# Patient Record
Sex: Male | Born: 1935 | Race: White | Hispanic: No | Marital: Married | Smoking: Never smoker
Health system: Southern US, Community
[De-identification: ages and names within clinical notes are randomized; demographics above are authoritative.]

## PROBLEM LIST (undated history)

## (undated) DIAGNOSIS — I447 Left bundle-branch block, unspecified: Secondary | ICD-10-CM

## (undated) DIAGNOSIS — I10 Essential (primary) hypertension: Secondary | ICD-10-CM

## (undated) DIAGNOSIS — Z950 Presence of cardiac pacemaker: Secondary | ICD-10-CM

## (undated) DIAGNOSIS — Z952 Presence of prosthetic heart valve: Secondary | ICD-10-CM

## (undated) DIAGNOSIS — Z9989 Dependence on other enabling machines and devices: Secondary | ICD-10-CM

## (undated) DIAGNOSIS — H9190 Unspecified hearing loss, unspecified ear: Secondary | ICD-10-CM

## (undated) DIAGNOSIS — E785 Hyperlipidemia, unspecified: Secondary | ICD-10-CM

## (undated) DIAGNOSIS — M479 Spondylosis, unspecified: Secondary | ICD-10-CM

## (undated) DIAGNOSIS — I440 Atrioventricular block, first degree: Secondary | ICD-10-CM

## (undated) DIAGNOSIS — I4891 Unspecified atrial fibrillation: Secondary | ICD-10-CM

## (undated) DIAGNOSIS — I639 Cerebral infarction, unspecified: Secondary | ICD-10-CM

## (undated) DIAGNOSIS — I350 Nonrheumatic aortic (valve) stenosis: Secondary | ICD-10-CM

## (undated) DIAGNOSIS — C4492 Squamous cell carcinoma of skin, unspecified: Secondary | ICD-10-CM

## (undated) DIAGNOSIS — I459 Conduction disorder, unspecified: Secondary | ICD-10-CM

## (undated) DIAGNOSIS — F528 Other sexual dysfunction not due to a substance or known physiological condition: Secondary | ICD-10-CM

## (undated) DIAGNOSIS — H3530 Unspecified macular degeneration: Secondary | ICD-10-CM

## (undated) DIAGNOSIS — I421 Obstructive hypertrophic cardiomyopathy: Secondary | ICD-10-CM

## (undated) DIAGNOSIS — I313 Pericardial effusion (noninflammatory): Secondary | ICD-10-CM

## (undated) DIAGNOSIS — C61 Malignant neoplasm of prostate: Secondary | ICD-10-CM

## (undated) DIAGNOSIS — R001 Bradycardia, unspecified: Secondary | ICD-10-CM

## (undated) DIAGNOSIS — M1990 Unspecified osteoarthritis, unspecified site: Secondary | ICD-10-CM

## (undated) DIAGNOSIS — Z973 Presence of spectacles and contact lenses: Secondary | ICD-10-CM

## (undated) DIAGNOSIS — G459 Transient cerebral ischemic attack, unspecified: Secondary | ICD-10-CM

## (undated) DIAGNOSIS — I3139 Other pericardial effusion (noninflammatory): Secondary | ICD-10-CM

## (undated) DIAGNOSIS — C679 Malignant neoplasm of bladder, unspecified: Secondary | ICD-10-CM

## (undated) HISTORY — DX: Left bundle-branch block, unspecified: I44.7

## (undated) HISTORY — DX: Obstructive hypertrophic cardiomyopathy: I42.1

## (undated) HISTORY — DX: Other sexual dysfunction not due to a substance or known physiological condition: F52.8

## (undated) HISTORY — PX: CATARACT EXTRACTION W/ INTRAOCULAR LENS  IMPLANT, BILATERAL: SHX1307

## (undated) HISTORY — DX: Transient cerebral ischemic attack, unspecified: G45.9

## (undated) HISTORY — DX: Bradycardia, unspecified: R00.1

## (undated) HISTORY — PX: SQUAMOUS CELL CARCINOMA EXCISION: SHX2433

## (undated) HISTORY — PX: CARDIAC VALVE REPLACEMENT: SHX585

## (undated) HISTORY — DX: Squamous cell carcinoma of skin, unspecified: C44.92

## (undated) HISTORY — DX: Conduction disorder, unspecified: I45.9

## (undated) HISTORY — PX: ANTERIOR CERVICAL DECOMP/DISCECTOMY FUSION: SHX1161

## (undated) HISTORY — DX: Essential (primary) hypertension: I10

## (undated) HISTORY — DX: Nonrheumatic aortic (valve) stenosis: I35.0

## (undated) HISTORY — PX: JOINT REPLACEMENT: SHX530

## (undated) HISTORY — DX: Atrioventricular block, first degree: I44.0

## (undated) HISTORY — DX: Unspecified hearing loss, unspecified ear: H91.90

## (undated) HISTORY — DX: Spondylosis, unspecified: M47.9

## (undated) HISTORY — PX: BACK SURGERY: SHX140

## (undated) HISTORY — DX: Unspecified osteoarthritis, unspecified site: M19.90

## (undated) HISTORY — PX: TONSILLECTOMY: SUR1361

---

## 1998-01-09 ENCOUNTER — Ambulatory Visit (HOSPITAL_COMMUNITY): Admission: RE | Admit: 1998-01-09 | Discharge: 1998-01-09 | Payer: Self-pay | Admitting: Cardiology

## 2002-08-20 ENCOUNTER — Encounter: Payer: Self-pay | Admitting: Family Medicine

## 2002-08-20 ENCOUNTER — Encounter: Admission: RE | Admit: 2002-08-20 | Discharge: 2002-08-20 | Payer: Self-pay | Admitting: Family Medicine

## 2004-09-11 ENCOUNTER — Ambulatory Visit: Payer: Self-pay | Admitting: Family Medicine

## 2005-02-25 ENCOUNTER — Emergency Department (HOSPITAL_COMMUNITY): Admission: EM | Admit: 2005-02-25 | Discharge: 2005-02-25 | Payer: Self-pay | Admitting: Emergency Medicine

## 2005-06-19 ENCOUNTER — Ambulatory Visit: Payer: Self-pay | Admitting: Family Medicine

## 2005-08-16 HISTORY — PX: TOTAL SHOULDER ARTHROPLASTY: SHX126

## 2005-09-20 ENCOUNTER — Ambulatory Visit: Payer: Self-pay | Admitting: Family Medicine

## 2006-06-07 ENCOUNTER — Inpatient Hospital Stay (HOSPITAL_COMMUNITY): Admission: RE | Admit: 2006-06-07 | Discharge: 2006-06-10 | Payer: Self-pay | Admitting: Orthopedic Surgery

## 2006-09-27 ENCOUNTER — Ambulatory Visit: Payer: Self-pay | Admitting: Family Medicine

## 2006-09-27 LAB — CONVERTED CEMR LAB
ALT: 18 units/L (ref 0–40)
AST: 19 units/L (ref 0–37)
Basophils Absolute: 0.1 10*3/uL (ref 0.0–0.1)
Cholesterol: 179 mg/dL (ref 0–200)
Creatinine, Ser: 1 mg/dL (ref 0.4–1.5)
Eosinophils Absolute: 0.1 10*3/uL (ref 0.0–0.6)
Eosinophils Relative: 1.7 % (ref 0.0–5.0)
Glucose, Bld: 88 mg/dL (ref 70–99)
HDL: 37.3 mg/dL — ABNORMAL LOW (ref >39.0)
Lymphocytes Relative: 23.6 % (ref 12.0–46.0)
MCHC: 33.9 g/dL (ref 30.0–36.0)
MCV: 86.2 fL (ref 78.0–100.0)
Monocytes Relative: 9.6 % (ref 3.0–11.0)
Neutro Abs: 4.2 10*3/uL (ref 1.4–7.7)
PSA: 5.07 ng/mL — ABNORMAL HIGH (ref 0.10–4.00)
Platelets: 224 10*3/uL (ref 150–400)
Potassium: 3.8 meq/L (ref 3.5–5.1)
RBC: 5.48 M/uL (ref 4.22–5.81)
Total Bilirubin: 1 mg/dL (ref 0.3–1.2)
Total CHOL/HDL Ratio: 4.8
Triglycerides: 140 mg/dL (ref 0–149)
WBC: 6.6 10*3/uL (ref 4.5–10.5)

## 2007-02-21 ENCOUNTER — Ambulatory Visit: Payer: Self-pay | Admitting: Family Medicine

## 2007-02-21 LAB — CONVERTED CEMR LAB: PSA: 4.69 ng/mL — ABNORMAL HIGH (ref 0.10–4.00)

## 2007-02-28 ENCOUNTER — Ambulatory Visit: Payer: Self-pay | Admitting: Family Medicine

## 2007-05-24 DIAGNOSIS — N401 Enlarged prostate with lower urinary tract symptoms: Secondary | ICD-10-CM | POA: Insufficient documentation

## 2007-05-24 DIAGNOSIS — R351 Nocturia: Secondary | ICD-10-CM | POA: Insufficient documentation

## 2007-05-24 DIAGNOSIS — M479 Spondylosis, unspecified: Secondary | ICD-10-CM | POA: Insufficient documentation

## 2007-05-24 DIAGNOSIS — H9190 Unspecified hearing loss, unspecified ear: Secondary | ICD-10-CM

## 2007-05-24 DIAGNOSIS — I10 Essential (primary) hypertension: Secondary | ICD-10-CM

## 2007-05-24 DIAGNOSIS — H269 Unspecified cataract: Secondary | ICD-10-CM | POA: Insufficient documentation

## 2007-05-24 DIAGNOSIS — F528 Other sexual dysfunction not due to a substance or known physiological condition: Secondary | ICD-10-CM

## 2007-05-26 ENCOUNTER — Ambulatory Visit: Payer: Self-pay | Admitting: Family Medicine

## 2007-10-03 ENCOUNTER — Ambulatory Visit: Payer: Self-pay | Admitting: Family Medicine

## 2007-10-03 LAB — CONVERTED CEMR LAB
Blood in Urine, dipstick: NEGATIVE
Nitrite: NEGATIVE
WBC Urine, dipstick: NEGATIVE

## 2007-10-12 ENCOUNTER — Telehealth: Payer: Self-pay | Admitting: Family Medicine

## 2008-03-11 ENCOUNTER — Telehealth: Payer: Self-pay | Admitting: Family Medicine

## 2008-03-18 ENCOUNTER — Telehealth: Payer: Self-pay | Admitting: Family Medicine

## 2008-03-19 ENCOUNTER — Ambulatory Visit: Payer: Self-pay | Admitting: Family Medicine

## 2008-03-20 LAB — CONVERTED CEMR LAB
ALT: 18 units/L (ref 0–53)
AST: 21 units/L (ref 0–37)
Basophils Relative: 0.2 % (ref 0.0–1.0)
Bilirubin, Direct: 0.2 mg/dL (ref 0.0–0.3)
CO2: 31 meq/L (ref 19–32)
Calcium: 9.5 mg/dL (ref 8.4–10.5)
Chloride: 102 meq/L (ref 96–112)
Creatinine, Ser: 1 mg/dL (ref 0.4–1.5)
Eosinophils Relative: 2 % (ref 0.0–5.0)
GFR calc non Af Amer: 76 mL/min
Glucose, Bld: 97 mg/dL (ref 70–99)
HCT: 46.6 % (ref 39.0–52.0)
Platelets: 215 10*3/uL (ref 150–400)
RBC: 5.36 M/uL (ref 4.22–5.81)
RDW: 12.4 % (ref 11.5–14.6)
Total Bilirubin: 1.1 mg/dL (ref 0.3–1.2)
Total CHOL/HDL Ratio: 5.4
Total Protein: 6.3 g/dL (ref 6.0–8.3)
Triglycerides: 121 mg/dL (ref 0–149)
VLDL: 24 mg/dL (ref 0–40)
WBC: 6.9 10*3/uL (ref 4.5–10.5)

## 2008-03-25 ENCOUNTER — Telehealth: Payer: Self-pay | Admitting: Family Medicine

## 2008-05-17 ENCOUNTER — Ambulatory Visit: Payer: Self-pay | Admitting: Family Medicine

## 2008-08-16 DIAGNOSIS — C61 Malignant neoplasm of prostate: Secondary | ICD-10-CM

## 2008-08-16 HISTORY — DX: Malignant neoplasm of prostate: C61

## 2008-09-10 ENCOUNTER — Ambulatory Visit: Payer: Self-pay | Admitting: Family Medicine

## 2008-10-03 ENCOUNTER — Ambulatory Visit: Payer: Self-pay | Admitting: Family Medicine

## 2008-10-03 LAB — CONVERTED CEMR LAB
Blood in Urine, dipstick: NEGATIVE
Glucose, Urine, Semiquant: NEGATIVE
WBC Urine, dipstick: NEGATIVE
pH: 6.5

## 2008-10-16 ENCOUNTER — Telehealth: Payer: Self-pay | Admitting: Family Medicine

## 2008-10-16 LAB — CONVERTED CEMR LAB
AST: 19 units/L (ref 0–37)
Alkaline Phosphatase: 52 units/L (ref 39–117)
Basophils Absolute: 0 10*3/uL (ref 0.0–0.1)
Chloride: 103 meq/L (ref 96–112)
Cholesterol: 196 mg/dL (ref 0–200)
Eosinophils Absolute: 0.2 10*3/uL (ref 0.0–0.7)
GFR calc non Af Amer: 76 mL/min
HDL: 34 mg/dL — ABNORMAL LOW (ref >39.0)
MCHC: 34.8 g/dL (ref 30.0–36.0)
MCV: 88.5 fL (ref 78.0–100.0)
Neutrophils Relative %: 58.4 % (ref 43.0–77.0)
Platelets: 193 10*3/uL (ref 150–400)
Potassium: 4 meq/L (ref 3.5–5.1)
Sodium: 141 meq/L (ref 135–145)
Total Bilirubin: 1.4 mg/dL — ABNORMAL HIGH (ref 0.3–1.2)
VLDL: 26 mg/dL (ref 0–40)
WBC: 5.9 10*3/uL (ref 4.5–10.5)

## 2008-11-26 ENCOUNTER — Ambulatory Visit: Admission: RE | Admit: 2008-11-26 | Discharge: 2009-02-24 | Payer: Self-pay | Admitting: Radiation Oncology

## 2009-02-25 ENCOUNTER — Ambulatory Visit: Admission: RE | Admit: 2009-02-25 | Discharge: 2009-03-12 | Payer: Self-pay | Admitting: Radiation Oncology

## 2009-05-15 ENCOUNTER — Ambulatory Visit: Payer: Self-pay | Admitting: Family Medicine

## 2009-10-07 ENCOUNTER — Ambulatory Visit: Payer: Self-pay | Admitting: Family Medicine

## 2009-10-08 LAB — CONVERTED CEMR LAB
Alkaline Phosphatase: 55 units/L (ref 39–117)
Basophils Relative: 0.2 % (ref 0.0–3.0)
Bilirubin, Direct: 0.1 mg/dL (ref 0.0–0.3)
CO2: 32 meq/L (ref 19–32)
Calcium: 9.7 mg/dL (ref 8.4–10.5)
Creatinine, Ser: 1.1 mg/dL (ref 0.4–1.5)
Eosinophils Absolute: 0.1 10*3/uL (ref 0.0–0.7)
HDL: 47.1 mg/dL (ref >39.00)
LDL Cholesterol: 116 mg/dL — ABNORMAL HIGH (ref 0–99)
Lymphocytes Relative: 23.3 % (ref 12.0–46.0)
MCHC: 32.5 g/dL (ref 30.0–36.0)
Neutrophils Relative %: 66 % (ref 43.0–77.0)
Platelets: 177 10*3/uL (ref 150.0–400.0)
RBC: 4.89 M/uL (ref 4.22–5.81)
Total Bilirubin: 0.7 mg/dL (ref 0.3–1.2)
Total CHOL/HDL Ratio: 4
Total Protein: 6.8 g/dL (ref 6.0–8.3)
Triglycerides: 142 mg/dL (ref 0.0–149.0)
WBC: 5.1 10*3/uL (ref 4.5–10.5)

## 2010-09-15 NOTE — Assessment & Plan Note (Signed)
Summary: emp patient fating/mhf   Vital Signs:  Patient profile:   77 year old male Height:      68.25 inches Weight:      212 pounds BMI:     32.11 Temp:     97.7 degrees F oral BP sitting:   120 / 78  (left arm) Cuff size:   regular  Vitals Entered By: Kern Reap CMA Duncan Dull) (October 07, 2009 8:26 AM)  Reason for Visit cpx  History of Present Illness: Robert Anderson is 77 year old married male, nonsmoker, who comes in today for evaluation of multiple issues.  He has underlying hypertension two with t thiazide 25 mg daily.  BP 120/78.  His history of prostate cancer.  He was treated with radiation surgery.  He's done well.  He is due to get follow-up by his urologist in May.  He gets routine eye care.  Dental care, when his last colonoscopy was.  Tetanus 2007 seasonal flu 2000 and Pneumovax 2007 shingles 2009.  He was recently started on Flomax by his urologist for urinary tension in frequency, nocturia times to every two hours.  It seems to help.  No postural hypotension  he and his wife have put in an application for friends home  he continues to exercise.  He goes to the [facility] 3 times per week  Allergies (verified): No Known Drug Allergies  Past History:  Past medical, surgical, family and social histories (including risk factors) reviewed, and no changes noted (except as noted below).  Past Medical History: Reviewed history from 05/24/2007 and no changes required. Hypertension Benign prostatic hypertrophy erectile dysfunction hearing loss DJD cervical  Past Surgical History: Reviewed history from 10/03/2008 and no changes required. Cataract extraction R  2004 Cataract extraction  L10  Family History: Reviewed history from 10/03/2007 and no changes required. father died 34, CVA.  Mother died 60, CVA, brain cancer.  Three brothers and 4 sisters, all in good health, except one brother died of lung cancer, and prostate cancer  Social History: Reviewed history  from 10/03/2007 and no changes required. Retired Married Never Smoked Alcohol use-yes Drug use-no Regular exercise-yes  Review of Systems      See HPI  Physical Exam  General:  Well-developed,well-nourished,in no acute distress; alert,appropriate and cooperative throughout examination Head:  Normocephalic and atraumatic without obvious abnormalities. No apparent alopecia or balding. Eyes:  No corneal or conjunctival inflammation noted. EOMI. Perrla. Funduscopic exam benign, without hemorrhages, exudates or papilledema. Vision grossly normal. Ears:  External ear exam shows no significant lesions or deformities.  Otoscopic examination reveals clear canals, tympanic membranes are intact bilaterally without bulging, retraction, inflammation or discharge. Hearing is grossly normal bilaterally. Nose:  External nasal examination shows no deformity or inflammation. Nasal mucosa are pink and moist without lesions or exudates. Mouth:  Oral mucosa and oropharynx without lesions or exudates.  Teeth in good repair. Neck:  No deformities, masses, or tenderness noted. Chest Wall:  No deformities, masses, tenderness or gynecomastia noted. Breasts:  No masses or gynecomastia noted Lungs:  Normal respiratory effort, chest expands symmetrically. Lungs are clear to auscultation, no crackles or wheezes. Heart:  Normal rate and regular rhythm. S1 and S2 normal without gallop, .........there is a soft grade 2/6 systolic ejection murmur heard best at the aortic area.  No diastolic murmur Abdomen:  Bowel sounds positive,abdomen soft and non-tender without masses, organomegaly or hernias noted. Rectal:  uro Genitalia:  uro Prostate:  uro Msk:  No deformity or scoliosis noted of thoracic or  lumbar spine.   Pulses:  R and L carotid,radial,femoral,dorsalis pedis and posterior tibial pulses are full and equal bilaterally Extremities:  he does have some hammertoes and thickening of his nails.  Advised to soak in file  weekly Neurologic:  No cranial nerve deficits noted. Station and gait are normal. Plantar reflexes are down-going bilaterally. DTRs are symmetrical throughout. Sensory, motor and coordinative functions appear intact. Skin:  Intact without suspicious lesions or rashes Cervical Nodes:  No lymphadenopathy noted Axillary Nodes:  No palpable lymphadenopathy Inguinal Nodes:  No significant adenopathy Psych:  Cognition and judgment appear intact. Alert and cooperative with normal attention span and concentration. No apparent delusions, illusions, hallucinations   Impression & Recommendations:  Problem # 1:  HEARING LOSS (ICD-389.9) Assessment Unchanged  Orders: Venipuncture (32440) TLB-Lipid Panel (80061-LIPID) TLB-BMP (Basic Metabolic Panel-BMET) (80048-METABOL) TLB-CBC Platelet - w/Differential (85025-CBCD) TLB-Hepatic/Liver Function Pnl (80076-HEPATIC) TLB-TSH (Thyroid Stimulating Hormone) (10272-ZDG) Prescription Created Electronically [phone])  Problem # 2:  HYPERTENSION (ICD-401.9) Assessment: Improved  His updated medication list for this problem includes:    Hydrochlorothiazide 25 Mg Tabs (Hydrochlorothiazide) ..... 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 3 x a week 2)  Hydrochlorothiazide 25 Mg Tabs (Hydrochlorothiazide) .... Every morning 3)  Stool Softener 100 Mg Caps (Docusate sodium) .... As directed 4)  Tamsulosin Hcl 0.4 Mg Caps (Tamsulosin hcl) .... As directed  Patient Instructions: 1)  Please schedule a follow-up appointment in 1 year. Prescriptions: HYDROCHLOROTHIAZIDE 25 MG  TABS (HYDROCHLOROTHIAZIDE) every morning  #100 x 3   Entered and Authorized by:   Roderick Pee MD   Signed by:   Roderick Pee MD on 10/07/2009   Method used:   Electronically to        [id number] (retail)       [address]       [place], [place]  [address]       Ph: [phone] or [phone]       Fax: [phone]   RxID:   [id number]

## 2010-10-08 ENCOUNTER — Encounter: Payer: Self-pay | Admitting: Family Medicine

## 2010-10-08 ENCOUNTER — Ambulatory Visit (INDEPENDENT_AMBULATORY_CARE_PROVIDER_SITE_OTHER): Payer: Medicare Other | Admitting: Family Medicine

## 2010-10-08 DIAGNOSIS — H9190 Unspecified hearing loss, unspecified ear: Secondary | ICD-10-CM

## 2010-10-08 DIAGNOSIS — I10 Essential (primary) hypertension: Secondary | ICD-10-CM

## 2010-10-08 DIAGNOSIS — Z8546 Personal history of malignant neoplasm of prostate: Secondary | ICD-10-CM

## 2010-10-08 LAB — BASIC METABOLIC PANEL
BUN: 22 mg/dL (ref 6–23)
Calcium: 9.7 mg/dL (ref 8.4–10.5)
Creatinine, Ser: 0.9 mg/dL (ref 0.4–1.5)
GFR: 99.49 mL/min (ref >60.00)
Glucose, Bld: 95 mg/dL (ref 70–99)
Sodium: 144 mEq/L (ref 135–145)

## 2010-10-08 LAB — CBC WITH DIFFERENTIAL/PLATELET
Basophils Absolute: 0 10*3/uL (ref 0.0–0.1)
Eosinophils Absolute: 0.1 10*3/uL (ref 0.0–0.7)
Hemoglobin: 14.8 g/dL (ref 13.0–17.0)
Lymphocytes Relative: 25.8 % (ref 12.0–46.0)
Monocytes Relative: 9.2 % (ref 3.0–12.0)
Neutrophils Relative %: 62.4 % (ref 43.0–77.0)
Platelets: 190 10*3/uL (ref 150.0–400.0)
RDW: 13.4 % (ref 11.5–14.6)

## 2010-10-08 LAB — HEPATIC FUNCTION PANEL
AST: 20 U/L (ref 0–37)
Alkaline Phosphatase: 101 U/L (ref 39–117)
Bilirubin, Direct: 0.1 mg/dL (ref 0.0–0.3)
Total Bilirubin: 0.7 mg/dL (ref 0.3–1.2)

## 2010-10-08 LAB — POCT URINALYSIS DIPSTICK
Blood, UA: NEGATIVE
Leukocytes, UA: NEGATIVE
Nitrite, UA: NEGATIVE
Protein, UA: NEGATIVE
pH, UA: 6

## 2010-10-08 LAB — LIPID PANEL
HDL: 41.2 mg/dL (ref >39.00)
LDL Cholesterol: 127 mg/dL — ABNORMAL HIGH (ref 0–99)
VLDL: 25.6 mg/dL (ref 0.0–40.0)

## 2010-10-08 LAB — TSH: TSH: 1.69 u[IU]/mL (ref 0.35–5.50)

## 2010-10-08 MED ORDER — HYDROCHLOROTHIAZIDE 25 MG PO TABS: 25.0000 mg | ORAL_TABLET | Freq: Every day | ORAL | Status: DC

## 2010-10-08 NOTE — Progress Notes (Signed)
  Subjective:    Patient ID: Robert Anderson, male    DOB: 1923-08-23, 77 y.o.   MRN: [id number]  HPI Al   Is an 77 year old, married male, nonsmoker, who comes in today for general physical examination because of a history of hypertension.  He has a history of underlying hypertension, treated with hydrochlorothiazide 25 mg daily.  BP 124/80.  Three years ago, and he developed an elevated PSA.  Urology referral and biopsy showed prostate cancer.  He subsequently underwent radiation treatments, and the cancer has resolved.  PSAs have been stable.  @BARCODE2D (Error - No data available.)@.  He continues to see the urologist.  He also takes Flomax .4 daily.  He gets routine eye care, dental care, colonoscopy, normal, vaccinations up to date.   Review of Systems  Constitutional: Negative.   HENT: Negative.   Eyes: Negative.   Respiratory: Negative.   Cardiovascular: Negative.   Gastrointestinal: Negative.   Genitourinary: Negative.   Musculoskeletal: Negative.   Skin: Negative.   Neurological: Negative.   Hematological: Negative.   Psychiatric/Behavioral: Negative.        Objective:   Physical Exam  Constitutional: He is oriented to person, place, and time. He appears well-developed and well-nourished.  HENT:  Head: Normocephalic and atraumatic.  Right Ear: External ear normal.  Left Ear: External ear normal.  Nose: Nose normal.  Mouth/Throat: Oropharynx is clear and moist.  Eyes: Conjunctivae and EOM are normal. Pupils are equal, round, and reactive to light.  Neck: Normal range of motion. Neck supple. No JVD present. No tracheal deviation present. No thyromegaly present.  Cardiovascular: Normal rate, regular rhythm, normal heart sounds and intact distal pulses.  Exam reveals no gallop and no friction rub.   No murmur heard.      Heart sounds extremely distant because of bell-shaped chest  Pulmonary/Chest: Effort normal and breath sounds normal. No stridor. No respiratory  distress. He has no wheezes. He has no rales. He exhibits no tenderness.  Abdominal: Soft. Bowel sounds are normal. He exhibits no distension and no mass. There is no tenderness. There is no rebound and no guarding.  Genitourinary: Rectum normal.  Musculoskeletal: Normal range of motion. He exhibits no edema and no tenderness.  Lymphadenopathy:    He has no cervical adenopathy.  Neurological: He is alert and oriented to person, place, and time. He has normal reflexes. No cranial nerve deficit. He exhibits normal muscle tone.  Skin: Skin is warm and dry. No rash noted. No erythema. No pallor.  Psychiatric: He has a normal mood and affect. His behavior is normal. Judgment and thought content normal.          Assessment & Plan:  Hypertension,,,,,,,,, continue medical Dyazide 25 mg daily.  History prostate cancer, status post radiation treatment, asymptomatic, followed by urology.  Hearing loss with hearing aid in right ear.  Continue current medications follow-up in one year

## 2010-10-08 NOTE — Patient Instructions (Signed)
Continue current medications follow-up in one year 

## 2010-10-13 NOTE — Progress Notes (Signed)
Left message on machine for patient

## 2010-12-28 ENCOUNTER — Encounter: Payer: Self-pay | Admitting: Family Medicine

## 2010-12-28 ENCOUNTER — Ambulatory Visit (INDEPENDENT_AMBULATORY_CARE_PROVIDER_SITE_OTHER): Payer: Medicare Other | Admitting: Family Medicine

## 2010-12-28 VITALS — BP 150/80 | Temp 97.6°F | Wt 211.0 lb

## 2010-12-28 DIAGNOSIS — R05 Cough: Secondary | ICD-10-CM

## 2010-12-28 MED ORDER — HYDROCODONE-HOMATROPINE 5-1.5 MG/5ML PO SYRP: ORAL_SOLUTION | ORAL | Status: DC

## 2010-12-28 NOTE — Patient Instructions (Signed)
Drink lots of water.  Hydromet one half to 1 teaspoon up to 3 times a day as needed for cough.  Return as needed

## 2010-12-28 NOTE — Progress Notes (Signed)
  Subjective:    Patient ID: Vernetta Honey, male    DOB: 10-08-1923, 77 y.o.   MRN: [id number]  HPI  Hyman Bible Is a 77 year old, married male, nonsmoker, who comes in today for evaluation of a cough for 4 days.  He states his wife was admitted to the hospital last week with pneumonia.  She had fever, chills, cough, shortness of breath.  She was hospitalized x 3 days and discharged on antibiotics with a diagnosis of bacterial pneumonia.  For the past 5, days.  He's had head congestion, fullness in his ears, cough, no fever, chills, no sputum production.  He also notices that his hearing aides don't seem to be working well    Review of Systems General and metabolic review of systems otherwise negative    Objective:   Physical Exam     Well-developed well-nourished man in acute distress.  HEENT negative.  Neck supple.  No adenopathy.  Lungs clear   Assessment & Plan:  ,Viral URI,,,,,,,,,,, Tylenol, lots of liquids, Hydromet one half to 1 teaspoon nightly p.r.n.  Return p.r.n.

## 2011-01-01 NOTE — Discharge Summary (Signed)
NAME:  Robert Anderson, Robert Anderson NO.:  [id number]   MEDICAL RECORD NO.:  [id number]          PATIENT TYPE:  INP   LOCATION:  5002                         FACILITY:  [facility]   PHYSICIAN:  Burnard Bunting, M.D.    DATE OF BIRTH:  [birth year]   DATE OF ADMISSION:  06/07/2006  DATE OF DISCHARGE:  06/10/2006                               DISCHARGE SUMMARY   DISCHARGE DIAGNOSIS:  Right shoulder arthritis.   SECONDARY DIAGNOSES:  1. Hypertrophic obstructive cardiomyopathy.  2. Sleep apnea.  3. Hypertension.  4. Osteoarthritis.  5. Prostate problems.   OPERATIONS AND PROCEDURE:  Right shoulder hemiarthroplasty and biceps  tenodesis performed June 07, 2006.   HOSPITAL COURSE:  [name] is a 77 year old patient with end-stage  right shoulder arthritis.  He underwent right shoulder hemiarthroplasty  on June 07, 2006.  He tolerated the procedure well without any  complications.  He was mobilized with physical therapy and started on  _rom_________ exercises.  __________ intact.  On postop #1, the incision  was intact.  On postop day #2, e had an otherwise unremarkable recovery.  He was discharged home in good condition on June 10, 2006.  He will  have a CPM machine at home to continue mobilization, as well as physical  therapy.   DISCHARGE MEDICATIONS:  Include saw palmetto, aspirin,  hydrochlorothiazide, Percocet for pain.   DISCHARGE INSTRUCTIONS:  I will see him back in 7 days for a recheck.      Burnard Bunting, M.D.  Electronically Signed     GSD/MEDQ  D:  08/02/2006  T:  08/03/2006  Job:  [id number]

## 2011-01-01 NOTE — Op Note (Signed)
NAME:  Robert Anderson, LANGHORST NO.:  [id number]   MEDICAL RECORD NO.:  [id number]          PATIENT TYPE:  INP   LOCATION:  5002                         FACILITY:  [facility]   PHYSICIAN:  Burnard Bunting, M.D.    DATE OF BIRTH:  1923/08/26   DATE OF PROCEDURE:  06/07/2006  DATE OF DISCHARGE:  06/10/2006                               OPERATIVE REPORT   PREOPERATIVE DIAGNOSIS:  Right shoulder arthritis.   POSTOPERATIVE DIAGNOSIS:  Right shoulder arthritis.   PROCEDURE:  Right shoulder hemiarthroplasty with biceps tenodesis.   ATTENDING SURGEON:  Burnard Bunting, M.D.   ASSISTANT:  None.   ANESTHESIA:  General endotracheal.   ESTIMATED BLOOD LOSS:  100 cc.   INDICATIONS:  Mr. Faiola is a 77 year old patient with end-stage right  shoulder arthritis, who presents now for shoulder hemiarthroplasty  following failure of conservative management.   PROCEDURE IN DETAIL:  The patient was brought to the operating room,  where general endotracheal anesthesia was induced.  Preoperative IV  antibiotics were administered.  The right shoulder, arm and hand were  prepped with DuraPrep solution and draped in a sterile manner.  The  patient was positioned in supine position with the head in neutral  position and the left arm elevated and the ulnar nerve protected.  Collier Flowers  was used to cover the operative field.  A deltopectoral approach was  utilized.  The skin and subcutaneous tissues were sharply divided.  The  cephalic vein was protected and mobilized laterally.  The clavipectoral  fascia was incised.  The subscap was taken down off of bone.  Subscap  release was performed.  The axillary nerve was palpated and protected at  all times during the remaining portion of the case.  Osteophytes were  removed from the humeral head.  Using a cutting jig, the humeral head  cut was made at the anatomic neck junction in approximately 25 to 30  degrees of retroversion.  The biceps tendon was released.   The bicipital  tuberosity was visualized.  Reaming was started about 1 cm posterior to  the bicipital tuberosity.  The head size was matched to the prosthesis.  The prosthesis was then placed into position.  Trial reduction was  performed and the patient was noted to have excellent stability with  about half the humeral head length translation posteriorly and  inferiorly.  His hand could be placed on his head, and he had external  rotation to 90 degrees , abduction to 90 degrees, internal rotation to  90 degrees, and adduction to about 70.  At this time, trial components  were removed.  True components were placed.  An excellent press-fit was  obtained.  Subscap was reattached to the lesser tuberosity through drill  holes.  The biceps was tenodesed.  The CA ligament was preserved.  The incision was thoroughly irrigated.  The deltopectoral interval was  closed using #1 Vicryl suture.  The skin was closed using interrupted,  inverted 2-0 Vicryl suture and 3-0 pullout Prolene.  An intraoperative x-  ray demonstrated  we had good placement of the prosthesis, without  overstuffing of the glenohumeral joint.      Burnard Bunting, M.D.  Electronically Signed     GSD/MEDQ  D:  08/02/2006  T:  08/02/2006  Job:  [id number]

## 2011-05-11 ENCOUNTER — Other Ambulatory Visit: Payer: Self-pay | Admitting: Family Medicine

## 2011-08-19 ENCOUNTER — Telehealth: Payer: Self-pay | Admitting: Family Medicine

## 2011-08-19 NOTE — Telephone Encounter (Signed)
Please bring the patient in next week, so we can excise this lesion

## 2011-08-19 NOTE — Telephone Encounter (Signed)
Pt has a nickel size wound on leg not bleeding but is very sore and has been dealing with it for 3+ weeks and wants to know what he should do, if he should see his dermatologist or if he could be worked in today. Please contact pt.

## 2011-08-20 NOTE — Telephone Encounter (Signed)
Spoke with wife and patient is going to his dermatologist

## 2011-10-11 ENCOUNTER — Encounter: Payer: Medicare Other | Admitting: Family Medicine

## 2011-11-15 ENCOUNTER — Other Ambulatory Visit: Payer: Self-pay | Admitting: Family Medicine

## 2011-12-07 ENCOUNTER — Encounter: Payer: Medicare Other | Admitting: Family Medicine

## 2012-01-03 ENCOUNTER — Encounter: Payer: Self-pay | Admitting: Family Medicine

## 2012-01-03 ENCOUNTER — Ambulatory Visit (INDEPENDENT_AMBULATORY_CARE_PROVIDER_SITE_OTHER): Payer: Medicare Other | Admitting: Family Medicine

## 2012-01-03 VITALS — BP 120/80 | Temp 98.7°F | Ht 68.5 in | Wt 210.0 lb

## 2012-01-03 DIAGNOSIS — Z8546 Personal history of malignant neoplasm of prostate: Secondary | ICD-10-CM

## 2012-01-03 DIAGNOSIS — I10 Essential (primary) hypertension: Secondary | ICD-10-CM

## 2012-01-03 DIAGNOSIS — F528 Other sexual dysfunction not due to a substance or known physiological condition: Secondary | ICD-10-CM

## 2012-01-03 DIAGNOSIS — H9190 Unspecified hearing loss, unspecified ear: Secondary | ICD-10-CM

## 2012-01-03 DIAGNOSIS — Z0000 Encounter for general adult medical examination without abnormal findings: Secondary | ICD-10-CM

## 2012-01-03 DIAGNOSIS — H269 Unspecified cataract: Secondary | ICD-10-CM

## 2012-01-03 DIAGNOSIS — M479 Spondylosis, unspecified: Secondary | ICD-10-CM

## 2012-01-03 LAB — CBC WITH DIFFERENTIAL/PLATELET
Basophils Relative: 0.5 % (ref 0.0–3.0)
Eosinophils Relative: 1.8 % (ref 0.0–5.0)
HCT: 45.7 % (ref 39.0–52.0)
Lymphs Abs: 1.4 10*3/uL (ref 0.7–4.0)
MCHC: 33.2 g/dL (ref 30.0–36.0)
MCV: 89.9 fl (ref 78.0–100.0)
Monocytes Absolute: 0.5 10*3/uL (ref 0.1–1.0)
Neutro Abs: 3.8 10*3/uL (ref 1.4–7.7)
RBC: 5.09 Mil/uL (ref 4.22–5.81)
WBC: 5.8 10*3/uL (ref 4.5–10.5)

## 2012-01-03 LAB — BASIC METABOLIC PANEL
BUN: 20 mg/dL (ref 6–23)
CO2: 29 mEq/L (ref 19–32)
Calcium: 9.3 mg/dL (ref 8.4–10.5)
Creatinine, Ser: 0.9 mg/dL (ref 0.4–1.5)
Glucose, Bld: 97 mg/dL (ref 70–99)

## 2012-01-03 MED ORDER — TAMSULOSIN HCL 0.4 MG PO CAPS: 0.4000 mg | ORAL_CAPSULE | Freq: Every day | ORAL | Status: DC

## 2012-01-03 MED ORDER — HYDROCHLOROTHIAZIDE 25 MG PO TABS: 25.0000 mg | ORAL_TABLET | Freq: Every day | ORAL | Status: DC

## 2012-01-03 NOTE — Progress Notes (Signed)
  Subjective:    Patient ID: Robert Anderson, male    DOB: March 30, 1924, 78 y.o.   MRN: [id number]  HPI Robert Anderson is an 78 year old married male nonsmoker who comes in today for a Medicare wellness examination  He takes hydrochlorothiazide 25 mg daily for hypertension BP 120/80  He takes Flomax 0.4 daily for at lead extraction he's had his prostate removed for prostate cancer. He's had multiple followups Dr.:ottlin has released him from followup. Last PSA was 0.17. Tetanus 2007 Pneumovax x2 shingles complete  He gets routine eye care, bilateral hearing aids, regular dental care, no need for colonoscopy,  Cognitive function normal he walks on a regular basis home health safety reviewed no issues identified, no guns in the house, he does have a health care power of attorney and living well   Review of Systems  Constitutional: Negative.   HENT: Negative.   Eyes: Negative.   Respiratory: Negative.   Cardiovascular: Negative.   Gastrointestinal: Negative.   Genitourinary: Positive for frequency.  Musculoskeletal: Negative.   Skin: Negative.   Neurological: Negative.   Hematological: Negative.   Psychiatric/Behavioral: Negative.        Objective:   Physical Exam  Constitutional: He is oriented to person, place, and time. He appears well-developed and well-nourished.  HENT:  Head: Normocephalic and atraumatic.  Right Ear: External ear normal.  Left Ear: External ear normal.  Nose: Nose normal.  Mouth/Throat: Oropharynx is clear and moist.       Bilateral lens implants  Eyes: Conjunctivae and EOM are normal. Pupils are equal, round, and reactive to light.       Bilateral hearing aids  Neck: Normal range of motion. Neck supple. No JVD present. No tracheal deviation present. No thyromegaly present.  Cardiovascular: Normal rate, regular rhythm, normal heart sounds and intact distal pulses.  Exam reveals no gallop and no friction rub.   No murmur heard. Pulmonary/Chest: Effort normal  and breath sounds normal. No stridor. No respiratory distress. He has no wheezes. He has no rales. He exhibits no tenderness.  Abdominal: Soft. Bowel sounds are normal. He exhibits no distension and no mass. There is no tenderness. There is no rebound and no guarding.  Musculoskeletal: Normal range of motion. He exhibits no edema and no tenderness.  Lymphadenopathy:    He has no cervical adenopathy.  Neurological: He is alert and oriented to person, place, and time. He has normal reflexes. No cranial nerve deficit. He exhibits normal muscle tone.  Skin: Skin is warm and dry. No rash noted. No erythema. No pallor.  Psychiatric: He has a normal mood and affect. His behavior is normal. Judgment and thought content normal.          Assessment & Plan:  Healthy male  Hypertension continue hydrochlorothiazide  History of BPH with outlet obstruction prostate cancer status post treatment continue Flomax 0.4 daily  Bilateral hearing loss  History of cataract removal and lens implants

## 2012-01-03 NOTE — Patient Instructions (Signed)
Continue your current medications  Followup in 1 year sooner if any problems 

## 2012-01-13 ENCOUNTER — Telehealth: Payer: Self-pay | Admitting: Family Medicine

## 2012-01-13 NOTE — Telephone Encounter (Signed)
Patient is aware of lab result. 

## 2012-01-13 NOTE — Telephone Encounter (Signed)
Pt requesting results of labs.

## 2012-01-13 NOTE — Telephone Encounter (Signed)
Please call labs normal followup in 1 year

## 2012-09-11 ENCOUNTER — Other Ambulatory Visit: Payer: Self-pay | Admitting: Family Medicine

## 2013-01-03 ENCOUNTER — Encounter: Payer: Medicare Other | Admitting: Family Medicine

## 2013-01-16 ENCOUNTER — Other Ambulatory Visit: Payer: Self-pay | Admitting: Family Medicine

## 2013-03-06 ENCOUNTER — Encounter: Payer: Medicare Other | Admitting: Family Medicine

## 2013-03-14 ENCOUNTER — Ambulatory Visit (INDEPENDENT_AMBULATORY_CARE_PROVIDER_SITE_OTHER): Payer: Medicare Other | Admitting: Family Medicine

## 2013-03-14 VITALS — BP 118/62 | HR 78 | Temp 97.7°F | Resp 18 | Ht 69.0 in | Wt 211.0 lb

## 2013-03-14 DIAGNOSIS — Z8589 Personal history of malignant neoplasm of other organs and systems: Secondary | ICD-10-CM

## 2013-03-14 DIAGNOSIS — L989 Disorder of the skin and subcutaneous tissue, unspecified: Secondary | ICD-10-CM

## 2013-03-14 NOTE — Progress Notes (Signed)
I directly supervised and participated in the procedure and agree with the student's documentation.  

## 2013-03-14 NOTE — Progress Notes (Signed)
[facility]:  Office Visit  Chief Complaint:  Chief Complaint  Patient presents with  . place on left leg    pt req. biopsy place has been on leg x1 year     HPI: Robert Anderson is a 79 y.o. male who complains of  Left leg lesion x 2 months or more. 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 Hurts him at night. He is not on blood thinners except for asa 81 mg TID. He is worried that it is a recurrence of his SCC and wants to get a biopsy, he had appt for PCP and also with his regular dermatologist but they have been either rescheduled multiple times or he won;t be able to see derm until January ( [place]). He would like it taken care of now.   Past Medical History  Diagnosis Date  . Cancer   . Cataract    Past Surgical History  Procedure Laterality Date  . Prostate surgery    . Joint replacement     History   Social History  . Marital Status: Married    Spouse Name: N/A    Number of Children: N/A  . Years of Education: N/A   Social History Main Topics  . Smoking status: Never Smoker   . Smokeless tobacco: None  . Alcohol Use: No  . Drug Use: No  . Sexually Active: None   Other Topics Concern  . None   Social History Narrative  . None   Family History  Problem Relation Age of Onset  . Heart disease Mother   . Cancer Father   . Cancer Sister   . Cancer Brother   . Heart disease Brother    No Known Allergies Prior to Admission medications   Medication Sig Start Date End Date Taking? Authorizing Provider  aspirin 81 MG tablet Take 81 mg by mouth. 3 x a week    Yes Historical Provider, MD  docusate sodium (COLACE) 100 MG capsule Take 100 mg by mouth 3 (three) times daily as needed.     Yes Historical Provider, MD  hydrochlorothiazide (HYDRODIURIL) 25 MG tablet TAKE 1 TABLET EVERY MORNING 09/11/12  Yes Roderick Pee, MD  tamsulosin (FLOMAX) 0.4 MG CAPS TAKE 1 CAPSULE (0.4 MG TOTAL) BY MOUTH AT BEDTIME. 01/16/13   Yes Roderick Pee, MD     ROS: The patient denies fevers, chills, night sweats, unintentional weight loss, chest pain, palpitations, wheezing, dyspnea on exertion, nausea, vomiting, abdominal pain, dysuria, hematuria, melena, numbness, weakness, or tingling.   All other systems have been reviewed and were otherwise negative with the exception of those mentioned in the HPI and as above.    PHYSICAL EXAM: Filed Vitals:   03/14/13 1514  BP: 118/62  Pulse: 78  Temp: 97.7 F (36.5 C)  Resp: 18   Filed Vitals:   03/14/13 1514  Height: 5\' 9"  (1.753 m)  Weight: 211 lb (95.709 kg)   Body mass index is 31.15 kg/(m^2).  General: Alert, no acute distress HEENT:  Normocephalic, atraumatic, oropharynx patent.  Cardiovascular:  Regular rate and rhythm, no rubs murmurs or gallops.  No Carotid bruits, radial pulse intact. No pedal edema.  Respiratory: Clear to auscultation bilaterally.  No wheezes, rales, or rhonchi.  No cyanosis, no use of accessory musculature GI: No organomegaly, abdomen is soft and non-tender, positive bowel sounds.  No masses. Skin: + multiple hyeprkeratotic lesions, blotchy erytheamtous base,  1/4 inch by 1/4 in lower lateral left leg x 2 lesionss, posterior is painful and anterior is not.  Neurologic: Facial musculature symmetric. Psychiatric: Patient is appropriate throughout our interaction. Lymphatic: No cervical lymphadenopathy Musculoskeletal: Gait intact.   LABS: Results for orders placed in visit on 01/03/12  CBC WITH DIFFERENTIAL      Result Value Range   WBC 5.8  4.5 - 10.5 K/uL   RBC 5.09  4.22 - 5.81 Mil/uL   Hemoglobin 15.2  13.0 - 17.0 g/dL   HCT 40.9  81.1 - 91.4 %   MCV 89.9  78.0 - 100.0 fl   MCHC 33.2  30.0 - 36.0 g/dL   RDW 78.2  95.6 - 21.3 %   Platelets 200.0  150.0 - 400.0 K/uL   Neutrophils Relative % 65.3  43.0 - 77.0 %   Lymphocytes Relative 24.3  12.0 - 46.0 %   Monocytes Relative 8.1  3.0 - 12.0 %   Eosinophils Relative 1.8  0.0 -  5.0 %   Basophils Relative 0.5  0.0 - 3.0 %   Neutro Abs 3.8  1.4 - 7.7 K/uL   Lymphs Abs 1.4  0.7 - 4.0 K/uL   Monocytes Absolute 0.5  0.1 - 1.0 K/uL   Eosinophils Absolute 0.1  0.0 - 0.7 K/uL   Basophils Absolute 0.0  0.0 - 0.1 K/uL  TSH      Result Value Range   TSH 1.96  0.35 - 5.50 uIU/mL  BASIC METABOLIC PANEL      Result Value Range   Sodium 139  135 - 145 mEq/L   Potassium 4.0  3.5 - 5.1 mEq/L   Chloride 104  96 - 112 mEq/L   CO2 29  19 - 32 mEq/L   Glucose, Bld 97  70 - 99 mg/dL   BUN 20  6 - 23 mg/dL   Creatinine, Ser 0.9  0.4 - 1.5 mg/dL   Calcium 9.3  8.4 - 08.6 mg/dL   GFR 57.84  >69.62 mL/min     EKG/XRAY:   Primary read interpreted by Dr. Conley Rolls at [facility].   ASSESSMENT/PLAN: Encounter Diagnoses  Name Primary?  . Skin lesion of left lower limb Yes  . History of squamous cell carcinoma    SCC vs actinic ketratosis vs keratoacanthomas Biopsy left leg anterior and posterior ( posterior bx was painful) Send out for pathology F/u as directed or prn If he does not hear from our office in 2 weeks then he needs to call us.    Rockne Coons, DO 03/14/2013 4:37 PM

## 2013-03-14 NOTE — Progress Notes (Signed)
Procedure: Consent obtained from patient for biopsy of 2 skin lesions. lesions cleaned with alcohol.Area locally anesthetized with 2cc of 2% lidocaine + epi. Lesions cleaned with betadine. 3 mm punch biopsy of each lesion performed.  Hemostasis obtained with Drysol. Area dressed. Wound care discussed.

## 2013-04-03 ENCOUNTER — Encounter: Payer: Medicare Other | Admitting: Family Medicine

## 2013-04-24 ENCOUNTER — Other Ambulatory Visit: Payer: Self-pay | Admitting: Family Medicine

## 2013-05-03 ENCOUNTER — Ambulatory Visit (INDEPENDENT_AMBULATORY_CARE_PROVIDER_SITE_OTHER): Payer: Medicare Other | Admitting: Internal Medicine

## 2013-05-03 ENCOUNTER — Encounter: Payer: Self-pay | Admitting: Internal Medicine

## 2013-05-03 VITALS — BP 136/80 | HR 60 | Temp 97.5°F | Resp 20 | Ht 68.25 in | Wt 213.0 lb

## 2013-05-03 DIAGNOSIS — Z8546 Personal history of malignant neoplasm of prostate: Secondary | ICD-10-CM

## 2013-05-03 DIAGNOSIS — M479 Spondylosis, unspecified: Secondary | ICD-10-CM

## 2013-05-03 DIAGNOSIS — I10 Essential (primary) hypertension: Secondary | ICD-10-CM

## 2013-05-03 DIAGNOSIS — H9190 Unspecified hearing loss, unspecified ear: Secondary | ICD-10-CM

## 2013-05-03 DIAGNOSIS — Z0000 Encounter for general adult medical examination without abnormal findings: Secondary | ICD-10-CM

## 2013-05-03 DIAGNOSIS — Z23 Encounter for immunization: Secondary | ICD-10-CM

## 2013-05-03 LAB — CBC WITH DIFFERENTIAL/PLATELET
Basophils Absolute: 0 10*3/uL (ref 0.0–0.1)
Basophils Relative: 0.5 % (ref 0.0–3.0)
Eosinophils Absolute: 0.1 10*3/uL (ref 0.0–0.7)
Lymphocytes Relative: 26.5 % (ref 12.0–46.0)
MCHC: 34 g/dL (ref 30.0–36.0)
Monocytes Relative: 8.8 % (ref 3.0–12.0)
Neutrophils Relative %: 62.7 % (ref 43.0–77.0)
RBC: 5.22 Mil/uL (ref 4.22–5.81)
RDW: 13.5 % (ref 11.5–14.6)

## 2013-05-03 LAB — TSH: TSH: 2 u[IU]/mL (ref 0.35–5.50)

## 2013-05-03 LAB — COMPREHENSIVE METABOLIC PANEL
AST: 19 U/L (ref 0–37)
Albumin: 4.1 g/dL (ref 3.5–5.2)
BUN: 24 mg/dL — ABNORMAL HIGH (ref 6–23)
CO2: 27 mEq/L (ref 19–32)
Calcium: 9.7 mg/dL (ref 8.4–10.5)
Chloride: 103 mEq/L (ref 96–112)
GFR: 71.83 mL/min (ref >60.00)
Glucose, Bld: 96 mg/dL (ref 70–99)
Potassium: 4 mEq/L (ref 3.5–5.1)

## 2013-05-03 LAB — LIPID PANEL
Cholesterol: 206 mg/dL — ABNORMAL HIGH (ref 0–200)
VLDL: 36.8 mg/dL (ref 0.0–40.0)

## 2013-05-03 MED ORDER — TAMSULOSIN HCL 0.4 MG PO CAPS: ORAL_CAPSULE | ORAL | Status: DC

## 2013-05-03 MED ORDER — HYDROCHLOROTHIAZIDE 25 MG PO TABS: ORAL_TABLET | ORAL | Status: DC

## 2013-05-03 NOTE — Patient Instructions (Signed)

## 2013-05-03 NOTE — Progress Notes (Signed)
Subjective:    Patient ID: Genella Rife, male    DOB: Feb 18, 1924, 79 y.o.   MRN: [id number]  HPI  79 year old patient who is seen today for a preventive health examination. He has a history of mild hypertension controlled on diuretic therapy. He is doing quite well today. He has a history of prostate cancer which has been treated by RT Remains active and participates with the next size program at the [facility] 3 times per week  Past Medical History  Diagnosis Date  . Cancer   . Cataract     History   Social History  . Marital Status: Married    Spouse Name: N/A    Number of Children: N/A  . Years of Education: N/A   Occupational History  . Not on file.   Social History Main Topics  . Smoking status: Never Smoker   . Smokeless tobacco: Not on file  . Alcohol Use: No  . Drug Use: No  . Sexual Activity: Not on file   Other Topics Concern  . Not on file   Social History Narrative  . No narrative on file    Past Surgical History  Procedure Laterality Date  . Prostate surgery    . Joint replacement      Family History  Problem Relation Age of Onset  . Heart disease Mother   . Cancer Father   . Cancer Sister   . Cancer Brother   . Heart disease Brother     No Known Allergies  Current Outpatient Prescriptions on File Prior to Visit  Medication Sig Dispense Refill  . aspirin 81 MG tablet Take 81 mg by mouth. 3 x a week       . docusate sodium (COLACE) 100 MG capsule Take 100 mg by mouth 3 (three) times daily as needed.        . hydrochlorothiazide (HYDRODIURIL) 25 MG tablet TAKE 1 TABLET EVERY MORNING  100 tablet  2  . tamsulosin (FLOMAX) 0.4 MG CAPS capsule TAKE 1 CAPSULE (0.4 MG TOTAL) BY MOUTH AT BEDTIME.  100 capsule  0   No current facility-administered medications on file prior to visit.    BP 136/80  Pulse 60  Temp(Src) 97.5 F (36.4 C) (Oral)  Resp 20  Ht 5' 8.25" (1.734 m)  Wt 213 lb (96.616 kg)  BMI 32.13 kg/m2  SpO2 96%   1. Risk factors,  based on past  M,S,F history  cardiovascular risk factors include age and male sex. He has treated hypertension  2.  Physical activities: Aerobic exercise 3 times weekly  3.  Depression/mood: No history depression or mood disorder  4.  Hearing: Moderate impairment. Uses a hearing aid on the right 5.  ADL's: Independent in all aspects of daily living  6.  Fall risk: Moderate increase do to age  32.  Home safety: No problems identified  8.  Height weight, and visual acuity; height and weight stable no change in visual acuity. Status post bilateral cataract extraction surgery  9.  Counseling: Modest weight loss encouraged regular exercise regimen encouraged  10. Lab orders based on risk factors: Laboratory profile will be reviewed in view of his history of prostate cancer a PSA will also be reviewed  11. Referral : Not appropriate at this time  12. Care plan: Regular exercise modest weight loss recommended  13. Cognitive assessment: Alert and oriented with normal affect. No cognitive dysfunction       Review of Systems  Constitutional: Negative  for fever, chills, appetite change and fatigue.  HENT: Negative for hearing loss, ear pain, congestion, sore throat, trouble swallowing, neck stiffness, dental problem, voice change and tinnitus.   Eyes: Negative for pain, discharge and visual disturbance.  Respiratory: Negative for cough, chest tightness, wheezing and stridor.   Cardiovascular: Negative for chest pain, palpitations and leg swelling.  Gastrointestinal: Negative for nausea, vomiting, abdominal pain, diarrhea, constipation, blood in stool and abdominal distention.  Genitourinary: Negative for urgency, hematuria, flank pain, discharge, difficulty urinating and genital sores.  Musculoskeletal: Negative for myalgias, back pain, joint swelling, arthralgias and gait problem.  Skin: Negative for rash.  Neurological: Negative for dizziness, syncope, speech difficulty, weakness,  numbness and headaches.  Hematological: Negative for adenopathy. Does not bruise/bleed easily.  Psychiatric/Behavioral: Negative for behavioral problems and dysphoric mood. The patient is not nervous/anxious.        Objective:   Physical Exam  Constitutional: He appears well-developed and well-nourished.  HENT:  Head: Normocephalic and atraumatic.  Right Ear: External ear normal.  Left Ear: External ear normal.  Nose: Nose normal.  Mouth/Throat: Oropharynx is clear and moist.  Eyes: Conjunctivae and EOM are normal. Pupils are equal, round, and reactive to light. No scleral icterus.  Neck: Normal range of motion. Neck supple. No JVD present. No thyromegaly present.  Right supraclavicular bruit  Cardiovascular: Regular rhythm.  Exam reveals no gallop and no friction rub.   Murmur heard. Grade 2/6 systolic murmur loudest at the base  Pedal pulses absent except for an intact right posterior tibial pulse  Pulmonary/Chest: Effort normal and breath sounds normal. He exhibits no tenderness.  Abdominal: Soft. Bowel sounds are normal. He exhibits no distension and no mass. There is no tenderness.  Genitourinary: Prostate normal and penis normal.  Musculoskeletal: Normal range of motion. He exhibits no edema and no tenderness.  Lymphadenopathy:    He has no cervical adenopathy.  Neurological: He is alert. He has normal reflexes. No cranial nerve deficit. Coordination normal.  Skin: Skin is warm and dry. No rash noted.  Psychiatric: He has a normal mood and affect. His behavior is normal.          Assessment & Plan:   Preventive health examination Hypertension well controlled History of prostate cancer. We'll check a PSA Osteoarthritis  We'll continue regular exercise regimen. Modest weight loss encouraged. Return in one year or as needed. Home blood pressure monitoring recommended

## 2013-05-08 ENCOUNTER — Telehealth: Payer: Self-pay | Admitting: Family Medicine

## 2013-05-08 NOTE — Telephone Encounter (Signed)
Notify patient that a low PSA  is a good sign that his prostate cancer is no longer active; this is a very favorable result  we had hoped for

## 2013-05-08 NOTE — Telephone Encounter (Signed)
PT would like to receive his PSA levels. Please assist.

## 2013-05-08 NOTE — Telephone Encounter (Signed)
PSA is low.  Please advise what to do.  Thanks!

## 2013-05-08 NOTE — Telephone Encounter (Signed)
Patient notified of PSA results

## 2013-06-09 ENCOUNTER — Other Ambulatory Visit: Payer: Self-pay | Admitting: Family Medicine

## 2013-10-04 ENCOUNTER — Other Ambulatory Visit: Payer: Self-pay | Admitting: Dermatology

## 2014-01-21 ENCOUNTER — Encounter: Payer: Self-pay | Admitting: Internal Medicine

## 2014-01-21 ENCOUNTER — Telehealth: Payer: Self-pay | Admitting: Family Medicine

## 2014-01-21 ENCOUNTER — Ambulatory Visit (INDEPENDENT_AMBULATORY_CARE_PROVIDER_SITE_OTHER): Payer: Medicare Other | Admitting: Internal Medicine

## 2014-01-21 VITALS — BP 124/72 | Temp 97.9°F | Wt 214.0 lb

## 2014-01-21 DIAGNOSIS — R4701 Aphasia: Secondary | ICD-10-CM | POA: Insufficient documentation

## 2014-01-21 DIAGNOSIS — R011 Cardiac murmur, unspecified: Secondary | ICD-10-CM

## 2014-01-21 DIAGNOSIS — I10 Essential (primary) hypertension: Secondary | ICD-10-CM

## 2014-01-21 DIAGNOSIS — Z8546 Personal history of malignant neoplasm of prostate: Secondary | ICD-10-CM

## 2014-01-21 DIAGNOSIS — R4789 Other speech disturbances: Secondary | ICD-10-CM

## 2014-01-21 DIAGNOSIS — G459 Transient cerebral ischemic attack, unspecified: Secondary | ICD-10-CM

## 2014-01-21 HISTORY — DX: Transient cerebral ischemic attack, unspecified: G45.9

## 2014-01-21 NOTE — Progress Notes (Signed)
Pre visit review using our clinic review tool, if applicable. No additional management support is needed unless otherwise documented below in the visit note.   Chief Complaint  Patient presents with  . Aphasia    Had one episode of Saturday with his wife.  She asked a question and he was unable to answer.  Has felt fine since.    HPI: Patient comes in today for SDA for  new problem evaluation. PCP na here with wife. Was in usual state of health until 2 days ago whencoundnt answer question from wife  answering  ? About keys from car.  couldn't get words out.  No hx of same   Finally did and no residual . isolated 5 sec dizziness.  In past  No assoc sx with this such as  Cp sob racing heart numbness or weakness. No vision hearing changes.  Wife said couldn't say something   Thought that  It was a hearing problem . She noted no focal weakness  ROS: See pertinent positives and negatives per HPI.exercises regularly without change in tolerance gym bicycle for 20 minutes and arm machine   Hs shoulder replacment. And ellpictical.  Has had heart murmur and ok but hasnt seen cards or echo test in years no problem no hx chf syncope or MI  No headache has hx of prostate cancer   Past Medical History  Diagnosis Date  . Cancer   . Cataract     Family History  Problem Relation Age of Onset  . Heart disease Mother   . Cancer Father   . Cancer Sister   . Cancer Brother   . Heart disease Brother     History   Social History  . Marital Status: Married    Spouse Name: N/A    Number of Children: N/A  . Years of Education: N/A   Social History Main Topics  . Smoking status: Never Smoker   . Smokeless tobacco: None  . Alcohol Use: No  . Drug Use: No  . Sexual Activity: None   Other Topics Concern  . None   Social History Narrative  . None    Outpatient Encounter Prescriptions as of 01/21/2014  Medication Sig  . aspirin 81 MG tablet Take 81 mg by mouth. 3 x a week   . docusate sodium  (COLACE) 100 MG capsule Take 100 mg by mouth 3 (three) times daily as needed.    . hydrochlorothiazide (HYDRODIURIL) 25 MG tablet TAKE 1 TABLET EVERY MORNING  . tamsulosin (FLOMAX) 0.4 MG CAPS capsule TAKE 1 CAPSULE (0.4 MG TOTAL) BY MOUTH AT BEDTIME.    EXAM:  BP 124/72  Temp(Src) 97.9 F (36.6 C) (Oral)  Wt 214 lb (97.07 kg)  Body mass index is 32.28 kg/(m^2).  GENERAL: vitals reviewed and listed above, alert, oriented, appears well hydrated and in no acute distress hard of hearing but alert and well appearing  HEENT: atraumatic, conjunctiva  clear, no obvious abnormalities on inspection of external nose and ears OP : no lesion edema or exudate tongue is midline  NECK: no obvious masses on inspection palpation  LUNGS: clear to auscultation bilaterally, no wheezes, rales or rhonchi, good air movement CV: HRRR, 2/6 semlsb no gallup some up to supraclavicular area  no clubbing cyanosis or  peripheral edema nl cap refill  Neuro oriented x 3 nl speech no obv weakness independent gait no tremor noted  Non fiocal eoms lateral left nystagmus that fatigues wears glasses  MS: moves all  extremities without noticeable focal  Abnormality djd effects  PSYCH: pleasant and cooperative, no obvious depression or anxiety Lab Results  Component Value Date   WBC 6.3 05/03/2013   HGB 15.9 05/03/2013   HCT 46.6 05/03/2013   PLT 207.0 05/03/2013   GLUCOSE 96 05/03/2013   CHOL 206* 05/03/2013   TRIG 184.0* 05/03/2013   HDL 42.70 05/03/2013   LDLDIRECT 134.9 05/03/2013   LDLCALC 127* 10/08/2010   ALT 19 05/03/2013   AST 19 05/03/2013   NA 140 05/03/2013   K 4.0 05/03/2013   CL 103 05/03/2013   CREATININE 1.0 05/03/2013   BUN 24* 05/03/2013   CO2 27 05/03/2013   TSH 2.00 05/03/2013   PSA 0.07* 05/03/2013    ASSESSMENT AND PLAN:  Discussed the following assessment and plan:  Aphasia - 5 min spell without assoc sx  - Plan: Ambulatory referral to Neurology, 2D Echocardiogram without contrast, US Carotid  Bilateral  Spell of change in speech - see above no slurring just couldnt get words out. - Plan: Ambulatory referral to Neurology, 2D Echocardiogram without contrast, US Carotid Bilateral  Heart murmur - undefined apparently present for years  - Plan: Ambulatory referral to Neurology, 2D Echocardiogram without contrast, US Carotid Bilateral  Unspecified essential hypertension  History of prostate cancer Take asa daily ; to ed if recurrs  Ref and arrange echo and carotids  Plan fu pcp after evaluation -Patient advised to return or notify health care team  if symptoms worsen ,persist or new concerns arise.  Patient Instructions  Uncertain cause of your symptoms but concern about TIAs. Or other . Your exam is good today  Murmur is still prominent .  Take Aspirin every day for now until evaluated. Will refer to neurology. WIll schedule  Echo cardiogram and  Carotid dopplers of the neck in the interim . I recurrent sx then contact for emergency care and emergency department.      Standley Brooking. Panosh M.D.

## 2014-01-21 NOTE — Patient Instructions (Signed)
Uncertain cause of your symptoms but concern about TIAs. Or other . Your exam is good today  Murmur is still prominent .  Take Aspirin every day for now until evaluated. Will refer to neurology. WIll schedule  Echo cardiogram and  Carotid dopplers of the neck in the interim . I recurrent sx then contact for emergency care and emergency department.

## 2014-01-21 NOTE — Telephone Encounter (Signed)
Patient Information:  Caller Name: Sonia Side  Phone: [phone]  Patient: Robert Anderson, Robert Anderson  Gender: Male  DOB: 01/22/1924  Age: 80 Years  PCP: Stevie Kern [facility])  Office Follow Up:  Does the office need to follow up with this patient?: No  Instructions For The Office: N/A   Symptoms  Reason For Call & Symptoms: Reports patient had a change in mental status this weekend. Reports that for approximately 5 minutes the patient could not verbalize anything. He is back to normal at this time.  Reviewed Health History In EMR: Yes  Reviewed Medications In EMR: Yes  Reviewed Allergies In EMR: Yes  Reviewed Surgeries / Procedures: Yes  Date of Onset of Symptoms: 01/19/2014  Guideline(s) Used:  Neurologic Deficit  Disposition Per Guideline:   Go to Office Now  Reason For Disposition Reached:   Neurologic deficit that was transient (now gone), ANY of the following:   Weakness of the face, arm, or leg on one side of the body  Numbness of the face, arm, or leg on one side of the body  Loss of speech or garbled speech  Advice Given:  N/A  Patient Will Follow Care Advice:  YES  Appointment Scheduled:  01/21/2014 10:45:00 Appointment Scheduled Provider:  Shanon Ace (Family Practice)

## 2014-01-22 ENCOUNTER — Ambulatory Visit (INDEPENDENT_AMBULATORY_CARE_PROVIDER_SITE_OTHER): Payer: Medicare Other | Admitting: Neurology

## 2014-01-22 ENCOUNTER — Encounter: Payer: Self-pay | Admitting: Neurology

## 2014-01-22 VITALS — BP 120/60 | HR 76 | Ht 70.0 in | Wt 215.0 lb

## 2014-01-22 DIAGNOSIS — G459 Transient cerebral ischemic attack, unspecified: Secondary | ICD-10-CM

## 2014-01-22 NOTE — Patient Instructions (Addendum)
1. MRI brain without contrast asap [facility] [place]  [place] [phone] 01/23/14 at 530PM 2. MRA head without contrast asap 3. Bloodwork for lipid panel, HbA1c 4. Continue daily baby aspirin 5. Continue good BP control 6. If symptoms recur, go to ER immediately

## 2014-01-22 NOTE — Progress Notes (Signed)
NEUROLOGY CONSULTATION NOTE  Nivin Braniff MRN: [id number] DOB: [birth year]  Referring provider: Dr. Shanon Ace Primary care provider: Dr. Stevie Kern  Reason for consult:  Transient episode of aphasia  Dear Dr Regis Bill:  Thank you for your kind referral of Cejay Cambre for consultation of the above symptoms. Although his history is well known to you, please allow me to reiterate it for the purpose of our medical record. The patient was accompanied to the clinic by his wife who also provides collateral information. Records and images were personally reviewed where available.  HISTORY OF PRESENT ILLNESS: This is a very pleasant 80 year old right-handed man with a history of hypertension, in his usual state of health until 01/19/2014 they were getting ready to eat and went into the car.  His wife was in the backseat asking him if he had the house keys and he could not answer her, stating that "the words wouldn't come out." He tried to reach his arm out to stop her but could not say a single word, then 5 minutes later he was able to talk.  He denied any associated headache, focal numbness/tingling/weakness, or dizziness.  His wife reported that his demeanor appeared like he was thinking, she thought he didn't hear her but appeared to be trying to say something but couldn't say it.  She asked him to stick his tongue out which he was able to do, then he started talking.  He denied feeling ill that day, no sleep deprivation or fatigue.  He denied any chest pain, palpitations, shortness of breath.  He denies any prior history of similar symptoms.  He has occasional dizziness for 5 seconds when standing up quickly, always checking his BP and noted to be normal.  He denies any diplopia, dysarthria, dysphagia, neck/back pain, bowel/bladder dysfunction.  He has occasional right hand tremor when holding his coffee cup. He is very active, exercising 3x/week.  He had been taking aspirin 3x/week due to easy  bruising, and was instructed to start taking it daily since his PCP visit yesterday.  PAST MEDICAL HISTORY: Past Medical History  Diagnosis Date  . Cancer   . Cataract     PAST SURGICAL HISTORY: Past Surgical History  Procedure Laterality Date  . Prostate surgery    . Joint replacement      MEDICATIONS: Current Outpatient Prescriptions on File Prior to Visit  Medication Sig Dispense Refill  . aspirin 81 MG tablet Take 81 mg by mouth daily.       Marland Kitchen docusate sodium (COLACE) 100 MG capsule Take 100 mg by mouth 3 (three) times daily as needed.        . hydrochlorothiazide (HYDRODIURIL) 25 MG tablet TAKE 1 TABLET EVERY MORNING  100 tablet  3  . tamsulosin (FLOMAX) 0.4 MG CAPS capsule TAKE 1 CAPSULE (0.4 MG TOTAL) BY MOUTH AT BEDTIME.  90 capsule  4   No current facility-administered medications on file prior to visit.    ALLERGIES: No Known Allergies  FAMILY HISTORY: Family History  Problem Relation Age of Onset  . Heart disease Mother   . Cancer Father   . Cancer Sister   . Cancer Brother   . Heart disease Brother     SOCIAL HISTORY: History   Social History  . Marital Status: Married    Spouse Name: N/A    Number of Children: N/A  . Years of Education: N/A   Occupational History  . Not on file.   Social  History Main Topics  . Smoking status: Never Smoker   . Smokeless tobacco: Not on file  . Alcohol Use: No  . Drug Use: No  . Sexual Activity: Not on file   Other Topics Concern  . Not on file   Social History Narrative  . No narrative on file    REVIEW OF SYSTEMS: Constitutional: No fevers, chills, or sweats, no generalized fatigue, change in appetite Eyes: No visual changes, double vision, eye pain Ear, nose and throat: No hearing loss, ear pain, nasal congestion, sore throat Cardiovascular: No chest pain, palpitations Respiratory:  No shortness of breath at rest or with exertion, wheezes GastrointestinaI: No nausea, vomiting, diarrhea, abdominal  pain, fecal incontinence Genitourinary:  No dysuria, urinary retention or frequency Musculoskeletal:  No neck pain, back pain Integumentary: No rash, pruritus, skin lesions Neurological: as above Psychiatric: No depression, insomnia, anxiety Endocrine: No palpitations, fatigue, diaphoresis, mood swings, change in appetite, change in weight, increased thirst Hematologic/Lymphatic:  No anemia, purpura, petechiae. Allergic/Immunologic: no itchy/runny eyes, nasal congestion, recent allergic reactions, rashes  PHYSICAL EXAM: Filed Vitals:   01/22/14 1502  BP: 120/60  Pulse: 76   General: No acute distress Head:  Normocephalic/atraumatic Eyes: Fundoscopic exam shows bilateral sharp discs, no vessel changes, exudates, or hemorrhages Neck: supple, no paraspinal tenderness, full range of motion Back: No paraspinal tenderness Heart: regular rate and rhythm Lungs: Clear to auscultation bilaterally. Vascular: No carotid bruits. Skin/Extremities: No rash, no edema Neurological Exam: Mental status: alert and oriented to person, place, and time, no dysarthria or aphasia, Fund of knowledge is appropriate.  Recent and remote memory are intact.  Attention and concentration are normal.    Able to name objects, read, write, and repeat phrases. Able to follow 2-step commands. Cranial nerves: CN I: not tested CN II: pupils equal, round and reactive to light, visual fields intact, fundi unremarkable. CN III, IV, VI:  full range of motion, no nystagmus, no ptosis CN V: facial sensation intact CN VII: upper and lower face symmetric CN VIII: hearing intact to finger rub CN IX, X: gag intact, uvula midline CN XI: sternocleidomastoid and trapezius muscles intact CN XII: tongue midline Bulk & Tone: normal, no fasciculations. Motor: 5/5 throughout with no pronator drift. Sensation: intact to light touch, cold, pin, vibration and joint position sense.  No extinction to double simultaneous stimulation.   Romberg test negative Deep Tendon Reflexes: +2 throughout, no ankle clonus Plantar responses: downgoing bilaterally Cerebellar: no incoordination on finger to nose testing with mild bilateral endpoint tremor Gait: narrow-based and steady, able to tandem walk adequately. Tremor: no resting or postural tremor, mild bilateral endpoint tremor  IMPRESSION: This is a pleasant 80 year old right-handed man with a history of hypertension presenting with a transient 5-minute episode of inability to speak, concerning for TIA presenting as expressive aphasia.  His neurological exam is normal, NIHSS 0, low ABCD score of 2 (age and speech disturbance). A TIA workup will be ordered, including MRI brain, MRA head. He has been scheduled for an echocardiogram and carotid dopplers by his PCP. Check lipid panel and HbA1c. Continue daily aspirin for now, consideration to switch to Plavix if intra/extracranial vasculature abnormal. Continue to monitor BP, keep normotensive.  He knows to go to the ER immediately if symptoms recur.  He will follow-up in 1 month.  Thank you for allowing me to participate in the care of this patient. Please do not hesitate to call for any questions or concerns.   Ellouise Newer, M.D.  CC: Dr. Stevie Kern

## 2014-01-23 ENCOUNTER — Telehealth: Payer: Self-pay | Admitting: Family Medicine

## 2014-01-23 ENCOUNTER — Ambulatory Visit (HOSPITAL_COMMUNITY): Payer: Medicare Other | Attending: Cardiology | Admitting: *Deleted

## 2014-01-23 ENCOUNTER — Other Ambulatory Visit (HOSPITAL_COMMUNITY): Payer: Self-pay | Admitting: Cardiology

## 2014-01-23 DIAGNOSIS — R42 Dizziness and giddiness: Secondary | ICD-10-CM | POA: Insufficient documentation

## 2014-01-23 DIAGNOSIS — R4701 Aphasia: Secondary | ICD-10-CM

## 2014-01-23 DIAGNOSIS — I10 Essential (primary) hypertension: Secondary | ICD-10-CM | POA: Insufficient documentation

## 2014-01-23 DIAGNOSIS — I658 Occlusion and stenosis of other precerebral arteries: Secondary | ICD-10-CM | POA: Insufficient documentation

## 2014-01-23 DIAGNOSIS — I6529 Occlusion and stenosis of unspecified carotid artery: Secondary | ICD-10-CM | POA: Insufficient documentation

## 2014-01-23 LAB — LIPID PANEL
CHOL/HDL RATIO: 4.8 ratio
Cholesterol: 188 mg/dL (ref 0–200)
HDL: 39 mg/dL — ABNORMAL LOW (ref >39)
LDL CALC: 85 mg/dL (ref 0–99)
Triglycerides: 321 mg/dL — ABNORMAL HIGH (ref <150)
VLDL: 64 mg/dL — ABNORMAL HIGH (ref 0–40)

## 2014-01-23 LAB — HEMOGLOBIN A1C
HEMOGLOBIN A1C: 6 % — AB (ref <5.7)
MEAN PLASMA GLUCOSE: 126 mg/dL — AB (ref <117)

## 2014-01-23 NOTE — Telephone Encounter (Signed)
Pt wife called she wants to know when the procedures that was ordered for her husband will be scheduled  Echocardiogram and carotid bilat

## 2014-01-23 NOTE — Progress Notes (Signed)
Carotid duplex complete 

## 2014-01-24 ENCOUNTER — Encounter: Payer: Self-pay | Admitting: Neurology

## 2014-01-29 ENCOUNTER — Ambulatory Visit (HOSPITAL_COMMUNITY)
Admission: RE | Admit: 2014-01-29 | Discharge: 2014-01-29 | Disposition: A | Discharge status: Home / Self Care | Payer: Medicare Other | Source: Ambulatory Visit | Attending: Cardiology | Admitting: Cardiology

## 2014-01-29 DIAGNOSIS — R4789 Other speech disturbances: Secondary | ICD-10-CM

## 2014-01-29 DIAGNOSIS — R011 Cardiac murmur, unspecified: Secondary | ICD-10-CM

## 2014-01-29 DIAGNOSIS — R4701 Aphasia: Secondary | ICD-10-CM

## 2014-01-29 DIAGNOSIS — I359 Nonrheumatic aortic valve disorder, unspecified: Secondary | ICD-10-CM

## 2014-01-29 NOTE — Progress Notes (Addendum)
2D Echo Performed 01/29/2014    Robert Anderson, RCS Talked to Dr. Ellyn Hack about echo before patient left.

## 2014-01-31 ENCOUNTER — Telehealth: Payer: Self-pay | Admitting: Family Medicine

## 2014-01-31 DIAGNOSIS — I350 Nonrheumatic aortic (valve) stenosis: Secondary | ICD-10-CM

## 2014-01-31 NOTE — Telephone Encounter (Signed)
His ECHO showed severe aortic valve stenosis and he needs to see Cardiology ASAP. We will set up a referral

## 2014-01-31 NOTE — Telephone Encounter (Signed)
Pt wife is requesting echocardiogram,blood work and carotoid doppler results.

## 2014-01-31 NOTE — Telephone Encounter (Signed)
Pt wife is aware both md out of office until monday

## 2014-01-31 NOTE — Telephone Encounter (Signed)
Called and spoke to the patient's wife.  Advised that we will be doing a referral to cardiology.  Per Dr. Sarajane Jews, pt should avoid most activities until advised by cardiology.  Dr. Sarajane Jews has placed the order for referral.

## 2014-01-31 NOTE — Telephone Encounter (Signed)
Blood work not ordered by Dr. Regis Bill or Sherren Mocha.  Pt has received the doppler results (normal).  Pt needs results of Echo.

## 2014-02-01 ENCOUNTER — Encounter: Payer: Self-pay | Admitting: *Deleted

## 2014-02-01 ENCOUNTER — Telehealth: Payer: Self-pay | Admitting: Neurology

## 2014-02-01 ENCOUNTER — Ambulatory Visit (INDEPENDENT_AMBULATORY_CARE_PROVIDER_SITE_OTHER): Payer: Medicare Other | Admitting: Cardiology

## 2014-02-01 VITALS — BP 117/66 | HR 71 | Ht 70.0 in | Wt 214.1 lb

## 2014-02-01 DIAGNOSIS — I421 Obstructive hypertrophic cardiomyopathy: Secondary | ICD-10-CM

## 2014-02-01 DIAGNOSIS — R011 Cardiac murmur, unspecified: Secondary | ICD-10-CM

## 2014-02-01 DIAGNOSIS — I639 Cerebral infarction, unspecified: Secondary | ICD-10-CM

## 2014-02-01 DIAGNOSIS — I10 Essential (primary) hypertension: Secondary | ICD-10-CM

## 2014-02-01 DIAGNOSIS — I359 Nonrheumatic aortic valve disorder, unspecified: Secondary | ICD-10-CM

## 2014-02-01 DIAGNOSIS — I350 Nonrheumatic aortic (valve) stenosis: Secondary | ICD-10-CM

## 2014-02-01 MED ORDER — METOPROLOL TARTRATE 25 MG PO TABS: 12.5000 mg | ORAL_TABLET | Freq: Two times a day (BID) | ORAL | Status: DC

## 2014-02-01 MED ORDER — CLOPIDOGREL BISULFATE 75 MG PO TABS: 75.0000 mg | ORAL_TABLET | Freq: Every day | ORAL | Status: DC

## 2014-02-01 NOTE — Progress Notes (Signed)
Patient ID: Robert Anderson, male   DOB: 04-09-1924, 80 y.o.   MRN: [id number]    Patient Name: Robert Anderson Date of Encounter: 02/01/2014  Primary Care Provider:  Joycelyn Man, MD Primary Cardiologist:  Dorothy Spark  Problem List   Past Medical History  Diagnosis Date  . HYPERTENSION 05/24/2007    Qualifier: Diagnosis of  By: Scherrie Gerlach    . DEGENERATIVE JOINT DISEASE, SPINE 05/24/2007    Qualifier: Diagnosis of  By: Scherrie Gerlach    . ERECTILE DYSFUNCTION 05/24/2007    Qualifier: Diagnosis of  By: Scherrie Gerlach    . HEARING LOSS 05/24/2007    Qualifier: Diagnosis of  By: Scherrie Gerlach    . Aphasia 01/21/2014    5 min spell without assoc sx    . CATARACTS, BILATERAL 05/24/2007    Qualifier: Diagnosis of  By: Scherrie Gerlach    . Heart murmur 01/21/2014    undefined apparently present for years    . History of prostate cancer 10/08/2010  . Spell of change in speech 01/21/2014    see above no slurring just couldnt get words out.   . Sleep apnea   . OA (osteoarthritis)    Past Surgical History  Procedure Laterality Date  . Prostate surgery    . Joint replacement    . Hemiarthroplasty shoulder fracture    . Cataract extraction      right 2004, left 2010   Allergies  No Known Allergies  HPI  A very pleasant 80 year old gentleman with prior medical history of hypertension, hyperlipidemia and hypertrophic cardiomyopathy diagnosed 20 years ago who has been referred to last for an episode of aphasia. The patient has been very active playing golf and tennis his whole life but he stopped recently due to advice of his primary care physician. fatigue. Lately he has been going to the [facility] 3 times a week where he rides a stationary bike and and does some strengthening exercises. He feels dizziness approximately once a months but increasing in frequency lately. He denies any palpitations or syncope. He describes one episode when his wife asked him to walk from the car to the  house and he was just sitting and couldn't say words despite being aware of the situation. He denies any lower extremity edema paroxysmal nocturnal dyspnea or orthopnea.   Home Medications  Prior to Admission medications   Medication Sig Start Date End Date Taking? Authorizing Provider  aspirin 81 MG tablet Take 81 mg by mouth daily.     Historical Provider, MD  docusate sodium (COLACE) 100 MG capsule Take 100 mg by mouth 3 (three) times daily as needed.      Historical Provider, MD  hydrochlorothiazide (HYDRODIURIL) 25 MG tablet TAKE 1 TABLET EVERY MORNING 06/09/13   Dorena Cookey, MD  tamsulosin (FLOMAX) 0.4 MG CAPS capsule TAKE 1 CAPSULE (0.4 MG TOTAL) BY MOUTH AT BEDTIME. 05/03/13   Marletta Lor, MD    Family History  Family History  Problem Relation Age of Onset  . Heart disease Mother   . Cancer Father   . Cancer Sister   . Prostate cancer Brother   . Heart disease Brother   . Lung cancer Brother   . CVA Mother   . CVA Father     Social History  History   Social History  . Marital Status: Married    Spouse Name: N/A    Number of Children: N/A  .  Years of Education: N/A   Occupational History  . Not on file.   Social History Main Topics  . Smoking status: Never Smoker   . Smokeless tobacco: Not on file  . Alcohol Use: No  . Drug Use: No  . Sexual Activity: Not on file   Other Topics Concern  . Not on file   Social History Narrative  . No narrative on file     Review of Systems, as per HPI, otherwise negative General:  No chills, fever, night sweats or weight changes.  Cardiovascular:  No chest pain, dyspnea on exertion, edema, orthopnea, palpitations, paroxysmal nocturnal dyspnea. Dermatological: No rash, lesions/masses Respiratory: No cough, dyspnea Urologic: No hematuria, dysuria Abdominal:   No nausea, vomiting, diarrhea, bright red blood per rectum, melena, or hematemesis Neurologic:  No visual changes, wkns, changes in mental status. All  other systems reviewed and are otherwise negative except as noted above.  Physical Exam  Blood pressure 117/66, pulse 71, height 5\' 10"  (1.778 m), weight 214 lb 1.9 oz (97.124 kg).  General: Pleasant, NAD Psych: Normal affect. Neuro: Alert and oriented X 3. Moves all extremities spontaneously. HEENT: Normal  Neck: Supple without bruits or JVD. Lungs:  Resp regular and unlabored, CTA. Heart: RRR no S2, no s3, s4, 5/6 holosystolic murmur, increases with Valsalva maneuver.. Abdomen: Soft, non-tender, non-distended, BS + x 4.  Extremities: No clubbing, cyanosis or edema. DP/PT/Radials 2+ and equal bilaterally.  Labs:  No results found for this basename: CKTOTAL, CKMB, TROPONINI,  in the last 72 hours Lab Results  Component Value Date   WBC 6.3 05/03/2013   HGB 15.9 05/03/2013   HCT 46.6 05/03/2013   MCV 89.2 05/03/2013   PLT 207.0 05/03/2013    No results found for this basename: DDIMER   No components found with this basename: POCBNP,     Component Value Date/Time   NA 140 05/03/2013 0951   K 4.0 05/03/2013 0951   CL 103 05/03/2013 0951   CO2 27 05/03/2013 0951   GLUCOSE 96 05/03/2013 0951   BUN 24* 05/03/2013 0951   CREATININE 1.0 05/03/2013 0951   CALCIUM 9.7 05/03/2013 0951   PROT 6.6 05/03/2013 0951   ALBUMIN 4.1 05/03/2013 0951   AST 19 05/03/2013 0951   ALT 19 05/03/2013 0951   ALKPHOS 54 05/03/2013 0951   BILITOT 1.0 05/03/2013 0951   GFRNONAA 67.91 10/07/2009 0849   GFRAA 92 10/03/2008 0856   Lab Results  Component Value Date   CHOL 188 01/22/2014   HDL 39* 01/22/2014   LDLCALC 85 01/22/2014   TRIG 321* 01/22/2014    Accessory Clinical Findings  Echocardiogram - 01/29/2014 Left ventricle: The cavity size was normal. There was severe concentric hypertrophy with significant LVOT gradient 30 mmHg increasing to 105 mmHg with Valsalva maneuvers. Systolic function was vigorous. The estimated ejection fraction was in the range of 65% to 70%. Wall motion was normal; there were no  regional wall motion abnormalities. Doppler parameters are consistent with abnormal left ventricular relaxation (grade 1 diastolic dysfunction). Doppler parameters are consistent with elevated ventricular end-diastolic filling pressure.  ------------------------------------------------------------------- Aortic valve: Trileaflet; severely thickened, severely calcified leaflets. Cusp separation was severely reduced. Mobility was not restricted. Doppler: There was moderate to severe stenosis. There was mild regurgitation. Mean gradient (S): 34 mm Hg. Peak gradient (S): 56 mm Hg.  ECG - Sinus rhythm with first-degree AV block, 60 beats per minute, left axis deviation and left bundle branch block, this is unchanged from 2014.  Assessment & Plan  A very pleasant 80 year old gentleman with unfortunate combination of 2 diagnosis hypertrophic cardiomyopathy and moderate to severe aortic stenosis. Patient has a long-standing diagnosis of hypertrophic cardiomyopathy and her most recent echo his Robert anterior septum measures 24 mm. There is chordal SAM and dynamic LVOT obstruction with 30 mm of mercury at rest and increases to 100 with Valsalva maneuver. Patient also has significantly thickened and calcified aortic valve with severe limited leaflet opening with mean gradient 36 mm of mercury. I believe this is severe left ear as he doesn't have S2 present. Unfortunately patient of this age is probably not ideal candidate for myectomy neither is he candidate for TAVR procedure for his aortic stenosis. The patient is still fairly asymptomatic, for now will discontinue diuretics and start on low-dose beta blocker to alleviate obstruction. We will follow patient closely every 3 months and possibly discussed with CT chest surgeon for possible surgery, however I don't believe he is a good surgical candidate.  Followup in 3 months  Dorothy Spark, MD, [facility] 02/01/2014, 11:58 AM

## 2014-02-01 NOTE — Telephone Encounter (Signed)
I spoke to patient and wife regarding MRI finding of small stroke on high posterior left frontal lobe. He has seen cardiologist this morning to discuss echo, no intervention for now.  Discussed switching to Plavix for secondary stroke prevention, stop aspirin, side effects discussed. Continue BP and cholesterol control, he will speak to his PCP regarding elevated triglycerides. They know to go to ER immediately if symptoms recur.

## 2014-02-01 NOTE — Patient Instructions (Addendum)
Your physician has recommended you make the following change in your medication:   STOP HCTZ  START METOPROLOL 12.5 MG TWICE DAILY   Your physician recommends that you schedule a follow-up appointment in:  3 months with Dr Meda Coffee

## 2014-02-11 ENCOUNTER — Other Ambulatory Visit (HOSPITAL_COMMUNITY): Payer: Medicare Other

## 2014-02-19 ENCOUNTER — Ambulatory Visit (INDEPENDENT_AMBULATORY_CARE_PROVIDER_SITE_OTHER): Payer: Medicare Other | Admitting: Neurology

## 2014-02-19 ENCOUNTER — Encounter: Payer: Self-pay | Admitting: Neurology

## 2014-02-19 VITALS — BP 118/74 | HR 62 | Resp 16 | Ht 70.0 in | Wt 215.5 lb

## 2014-02-19 DIAGNOSIS — I639 Cerebral infarction, unspecified: Secondary | ICD-10-CM

## 2014-02-19 DIAGNOSIS — I6350 Cerebral infarction due to unspecified occlusion or stenosis of unspecified cerebral artery: Secondary | ICD-10-CM

## 2014-02-19 HISTORY — DX: Cerebral infarction, unspecified: I63.9

## 2014-02-19 NOTE — Patient Instructions (Addendum)
1. Continue Plavix daily 2. Continue control of BP, cholesterol, sugar levels. Discuss elevated triglycerides with Dr. Sherren Mocha 3. If symptoms recur, go to ER immediately

## 2014-02-19 NOTE — Progress Notes (Signed)
NEUROLOGY FOLLOW UP OFFICE NOTE  Robert Anderson [id number]  HISTORY OF PRESENT ILLNESS: I had the pleasure of seeing Robert Anderson in follow-up in the neurology clinic on 02/19/2014.  The patient was last seen a month ago after he had an episode of transient aphasia last 01/19/2014. He is again accompanied by his wife today.  I personally reviewed MRI brain without contrast done 10 days after event which showed a small cortical focus of FLAIR signal in the high posterior left frontal lobe suggestive of late subacute infarct. MRA brain unremarkable. Carotid dopplers showed heterogenous plaque bilaterally with no significant stenosis.  Echocardiogram showed severe concentric LVH, hyperdynamic LVEF, moderate to severe aortic stenosis. Left atrium was moderately dilated. He was evaluated by cardiology, diuretics discontinued and started on Metoprolol.  I spoke to him on the phone regarding MRI results showing small stroke, and he was switched from aspirin to Plavix for secondary stroke prevention.  HbA1c 6.0, lipid panel showed LDL 85. Triglycerides were elevated at 321, however he was not fasting prior to the test.  He denies any further similar speech difficulties over the past month.  He denies any headaches, dizziness, diplopia, focal numbness/tingling/weakness.  He is in very good spirits.  HPI:  This is a very pleasant 80 yo RH man with a history of hypertension, in his usual state of health until 01/19/2014 they were getting ready to eat and went into the car. His wife was in the backseat asking him if he had the house keys and he could not answer her, stating that "the words wouldn't come out." He tried to reach his arm out to stop her but could not say a single word, then 5 minutes later he was able to talk. He denied any associated headache, focal numbness/tingling/weakness, or dizziness. His wife reported that his demeanor appeared like he was thinking, she thought he didn't hear her but appeared  to be trying to say something but couldn't say it. She asked him to stick his tongue out which he was able to do, then he started talking.   He has occasional right hand tremor when holding his coffee cup. He is very active, exercising 3x/week. He had been taking aspirin 3x/week due to easy bruising, switched to Plavix.  PAST MEDICAL HISTORY: Past Medical History  Diagnosis Date  . HYPERTENSION 05/24/2007    Qualifier: Diagnosis of  By: Scherrie Gerlach    . DEGENERATIVE JOINT DISEASE, SPINE 05/24/2007    Qualifier: Diagnosis of  By: Scherrie Gerlach    . ERECTILE DYSFUNCTION 05/24/2007    Qualifier: Diagnosis of  By: Scherrie Gerlach    . HEARING LOSS 05/24/2007    Qualifier: Diagnosis of  By: Scherrie Gerlach    . Aphasia 01/21/2014    5 min spell without assoc sx    . CATARACTS, BILATERAL 05/24/2007    Qualifier: Diagnosis of  By: Scherrie Gerlach    . Heart murmur 01/21/2014    undefined apparently present for years    . History of prostate cancer 10/08/2010  . Spell of change in speech 01/21/2014    see above no slurring just couldnt get words out.   . Sleep apnea   . OA (osteoarthritis)     MEDICATIONS: Current Outpatient Prescriptions on File Prior to Visit  Medication Sig Dispense Refill  . clopidogrel (PLAVIX) 75 MG tablet Take 1 tablet (75 mg total) by mouth daily.  30 tablet  11  . docusate  sodium (COLACE) 100 MG capsule Take 100 mg by mouth 3 (three) times daily as needed.        . metoprolol tartrate (LOPRESSOR) 25 MG tablet Take 0.5 tablets (12.5 mg total) by mouth 2 (two) times daily.  180 tablet  3  . tamsulosin (FLOMAX) 0.4 MG CAPS capsule TAKE 1 CAPSULE (0.4 MG TOTAL) BY MOUTH AT BEDTIME.  90 capsule  4   No current facility-administered medications on file prior to visit.    ALLERGIES: No Known Allergies  FAMILY HISTORY: Family History  Problem Relation Age of Onset  . Heart disease Mother   . Cancer Father   . Cancer Sister   . Prostate cancer Brother   . Heart  disease Brother   . Lung cancer Brother   . CVA Mother   . CVA Father     SOCIAL HISTORY: History   Social History  . Marital Status: Married    Spouse Name: N/A    Number of Children: N/A  . Years of Education: N/A   Occupational History  . Not on file.   Social History Main Topics  . Smoking status: Never Smoker   . Smokeless tobacco: Not on file  . Alcohol Use: No  . Drug Use: No  . Sexual Activity: Not on file   Other Topics Concern  . Not on file   Social History Narrative  . No narrative on file    REVIEW OF SYSTEMS: Constitutional: No fevers, chills, or sweats, no generalized fatigue, change in appetite Eyes: No visual changes, double vision, eye pain Ear, nose and throat: No hearing loss, ear pain, nasal congestion, sore throat Cardiovascular: No chest pain, palpitations Respiratory:  No shortness of breath at rest or with exertion, wheezes GastrointestinaI: No nausea, vomiting, diarrhea, abdominal pain, fecal incontinence Genitourinary:  No dysuria, urinary retention or frequency Musculoskeletal:  No neck pain, back pain Integumentary: No rash, pruritus, skin lesions Neurological: as above Psychiatric: No depression, insomnia, anxiety Endocrine: No palpitations, fatigue, diaphoresis, mood swings, change in appetite, change in weight, increased thirst Hematologic/Lymphatic:  No anemia, purpura, petechiae. Allergic/Immunologic: no itchy/runny eyes, nasal congestion, recent allergic reactions, rashes  PHYSICAL EXAM: Filed Vitals:   02/19/14 1350  BP: 118/74  Pulse: 62  Resp: 16   General: No acute distress Head:  Normocephalic/atraumatic Neck: supple, no paraspinal tenderness, full range of motion Heart:  Regular rate and rhythm Lungs:  Clear to auscultation bilaterally Back: No paraspinal tenderness Skin/Extremities: No rash, no edema Neurological Exam: alert and oriented to person, place, and time. No aphasia or dysarthria. Able to name low and  high frequency objects, repeat, read, write.  Fund of knowledge is appropriate.  Recent and remote memory are intact.  Attention and concentration are normal.    Cranial nerves: Pupils equal, round, reactive to light.  Fundoscopic exam unremarkable, no papilledema. Extraocular movements intact with no nystagmus. Visual fields full. Facial sensation intact. No facial asymmetry. Tongue, uvula, palate midline.  Motor: Bulk and tone normal, muscle strength 5/5 throughout with no pronator drift.  Sensation to light touch, temperature and vibration intact.  No extinction to double simultaneous stimulation.  Deep tendon reflexes 2+ throughout, toes downgoing.  Finger to nose testing intact with mild bilateral endpoint tremor, R>L.  Gait narrow-based and steady, able to tandem walk adequately.  Romberg negative.  IMPRESSION: This is a pleasant 80 yo RH man who presented with a transient 5-minute episode of inability to speak.  MRI brain showed a small cortical infarct  in the left posterior frontal region. MRA and carotid dopplers did not show significant obstruction, echo shows moderate aortic stenosis and dilated left atrium. He denies any palpitations or sensation of irregular heart rhythm.  He has been switched from aspirin to Plavix for secondary stroke prevention, and will continue to monitor cardiac status with his cardiologist. Continue control of vascular risk factors, he will discuss elevated triglycerides with his PCP.  He knows to go to ER immediately if symptoms recur.  He will follow-up in 6 months.  Thank you for allowing me to participate in his care.  Please do not hesitate to call for any questions or concerns.  The duration of this appointment visit was 15 minutes of face-to-face time with the patient.  Greater than 50% of this time was spent in counseling, explanation of diagnosis, planning of further management, and coordination of care.   Ellouise Newer, M.D.   CC: Dr. Sherren Mocha Dr.  Levell July

## 2014-03-07 NOTE — Telephone Encounter (Signed)
Comp 01/29/14

## 2014-03-29 ENCOUNTER — Other Ambulatory Visit: Payer: Self-pay | Admitting: Family Medicine

## 2014-03-29 ENCOUNTER — Telehealth: Payer: Self-pay | Admitting: Family Medicine

## 2014-03-29 DIAGNOSIS — I639 Cerebral infarction, unspecified: Secondary | ICD-10-CM

## 2014-03-29 MED ORDER — CLOPIDOGREL BISULFATE 75 MG PO TABS: 75.0000 mg | ORAL_TABLET | Freq: Every day | ORAL | Status: DC

## 2014-03-29 NOTE — Telephone Encounter (Signed)
Ok to refill, thanks

## 2014-03-29 NOTE — Telephone Encounter (Signed)
90 day Rx sent to pt's pharmacy.

## 2014-03-29 NOTE — Telephone Encounter (Signed)
Rx request from pharmacy. Patient request 90 day rx on Clopidogrel 75mg .  [facility]

## 2014-05-06 ENCOUNTER — Ambulatory Visit (INDEPENDENT_AMBULATORY_CARE_PROVIDER_SITE_OTHER): Payer: Medicare Other | Admitting: Cardiology

## 2014-05-06 ENCOUNTER — Encounter: Payer: Self-pay | Admitting: Cardiology

## 2014-05-06 ENCOUNTER — Encounter: Payer: Medicare Other | Admitting: Family Medicine

## 2014-05-06 VITALS — BP 120/58 | HR 53 | Ht 70.0 in | Wt 215.0 lb

## 2014-05-06 DIAGNOSIS — I359 Nonrheumatic aortic valve disorder, unspecified: Secondary | ICD-10-CM

## 2014-05-06 DIAGNOSIS — R011 Cardiac murmur, unspecified: Secondary | ICD-10-CM

## 2014-05-06 DIAGNOSIS — I421 Obstructive hypertrophic cardiomyopathy: Secondary | ICD-10-CM

## 2014-05-06 DIAGNOSIS — I10 Essential (primary) hypertension: Secondary | ICD-10-CM

## 2014-05-06 DIAGNOSIS — I350 Nonrheumatic aortic (valve) stenosis: Secondary | ICD-10-CM

## 2014-05-06 NOTE — Progress Notes (Signed)
Patient ID: Bricen Victory, male   DOB: 22-Jan-1924, 80 y.o.   MRN: [id number]    Patient Name: Robert Anderson Date of Encounter: 05/06/2014  Primary Care Provider:  Joycelyn Man, MD Primary Cardiologist:  Dorothy Spark  Problem List   Past Medical History  Diagnosis Date  . HYPERTENSION 05/24/2007    Qualifier: Diagnosis of  By: Scherrie Gerlach    . DEGENERATIVE JOINT DISEASE, SPINE 05/24/2007    Qualifier: Diagnosis of  By: Scherrie Gerlach    . ERECTILE DYSFUNCTION 05/24/2007    Qualifier: Diagnosis of  By: Scherrie Gerlach    . HEARING LOSS 05/24/2007    Qualifier: Diagnosis of  By: Scherrie Gerlach    . Aphasia 01/21/2014    5 min spell without assoc sx    . CATARACTS, BILATERAL 05/24/2007    Qualifier: Diagnosis of  By: Scherrie Gerlach    . Heart murmur 01/21/2014    undefined apparently present for years    . History of prostate cancer 10/08/2010  . Spell of change in speech 01/21/2014    see above no slurring just couldnt get words out.   . Sleep apnea   . OA (osteoarthritis)    Past Surgical History  Procedure Laterality Date  . Prostate surgery    . Joint replacement    . Hemiarthroplasty shoulder fracture    . Cataract extraction      right 2004, left 2010   Allergies  No Known Allergies  HPI  A very pleasant 80 year old gentleman with prior medical history of hypertension, hyperlipidemia and hypertrophic cardiomyopathy diagnosed 20 years ago who has been referred to last for an episode of aphasia. The patient has been very active playing golf and tennis his whole life but he stopped recently due to advice of his primary care physician. fatigue. Lately he has been going to the [facility] 3 times a week where he rides a stationary bike and and does some strengthening exercises. He feels dizziness approximately once a months but increasing in frequency lately. He denies any palpitations or syncope. He describes one episode when his wife asked him to walk from the car to the  house and he was just sitting and couldn't say words despite being aware of the situation. He denies any lower extremity edema paroxysmal nocturnal dyspnea or orthopnea.   05/06/2014 - the patient states that since he started the Toprol she actually feels better. The patient continues exercising at the [facility] 3 times per week for 30-40 minutes daily. He denies any episodes of dizziness, chest pain, dyspnea on exertion, fall or syncope he's pretty much asymptomatic at this point.  Home Medications  Prior to Admission medications   Medication Sig Start Date End Date Taking? Authorizing Provider  aspirin 81 MG tablet Take 81 mg by mouth daily.     Historical Provider, MD  docusate sodium (COLACE) 100 MG capsule Take 100 mg by mouth 3 (three) times daily as needed.      Historical Provider, MD  hydrochlorothiazide (HYDRODIURIL) 25 MG tablet TAKE 1 TABLET EVERY MORNING 06/09/13   Dorena Cookey, MD  tamsulosin (FLOMAX) 0.4 MG CAPS capsule TAKE 1 CAPSULE (0.4 MG TOTAL) BY MOUTH AT BEDTIME. 05/03/13   Marletta Lor, MD    Family History  Family History  Problem Relation Age of Onset  . Heart disease Mother   . Cancer Father   . Cancer Sister   . Prostate cancer Brother   .  Heart disease Brother   . Lung cancer Brother   . CVA Mother   . CVA Father     Social History  History   Social History  . Marital Status: Married    Spouse Name: N/A    Number of Children: N/A  . Years of Education: N/A   Occupational History  . Not on file.   Social History Main Topics  . Smoking status: Never Smoker   . Smokeless tobacco: Not on file  . Alcohol Use: No  . Drug Use: No  . Sexual Activity: Not on file   Other Topics Concern  . Not on file   Social History Narrative  . No narrative on file     Review of Systems, as per HPI, otherwise negative General:  No chills, fever, night sweats or weight changes.  Cardiovascular:  No chest pain, dyspnea on exertion, edema, orthopnea,  palpitations, paroxysmal nocturnal dyspnea. Dermatological: No rash, lesions/masses Respiratory: No cough, dyspnea Urologic: No hematuria, dysuria Abdominal:   No nausea, vomiting, diarrhea, bright red blood per rectum, melena, or hematemesis Neurologic:  No visual changes, wkns, changes in mental status. All other systems reviewed and are otherwise negative except as noted above.  Physical Exam  Blood pressure 120/58, pulse 53, height 5\' 10"  (1.778 m), weight 215 lb (97.523 kg), SpO2 95.00%.  General: Pleasant, NAD Psych: Normal affect. Neuro: Alert and oriented X 3. Moves all extremities spontaneously. HEENT: Normal  Neck: Supple without bruits or JVD. Lungs:  Resp regular and unlabored, CTA. Heart: RRR no S2, no s3, s4, 5/6 holosystolic murmur, increases with Valsalva maneuver.. Abdomen: Soft, non-tender, non-distended, BS + x 4.  Extremities: No clubbing, cyanosis or edema. DP/PT/Radials 2+ and equal bilaterally.  Labs:  No results found for this basename: CKTOTAL, CKMB, TROPONINI,  in the last 72 hours Lab Results  Component Value Date   WBC 6.3 05/03/2013   HGB 15.9 05/03/2013   HCT 46.6 05/03/2013   MCV 89.2 05/03/2013   PLT 207.0 05/03/2013    No results found for this basename: DDIMER   No components found with this basename: POCBNP,     Component Value Date/Time   NA 140 05/03/2013 0951   K 4.0 05/03/2013 0951   CL 103 05/03/2013 0951   CO2 27 05/03/2013 0951   GLUCOSE 96 05/03/2013 0951   BUN 24* 05/03/2013 0951   CREATININE 1.0 05/03/2013 0951   CALCIUM 9.7 05/03/2013 0951   PROT 6.6 05/03/2013 0951   ALBUMIN 4.1 05/03/2013 0951   AST 19 05/03/2013 0951   ALT 19 05/03/2013 0951   ALKPHOS 54 05/03/2013 0951   BILITOT 1.0 05/03/2013 0951   GFRNONAA 67.91 10/07/2009 0849   GFRAA 92 10/03/2008 0856   Lab Results  Component Value Date   CHOL 188 01/22/2014   HDL 39* 01/22/2014   LDLCALC 85 01/22/2014   TRIG 321* 01/22/2014    Accessory Clinical Findings  Echocardiogram -  01/29/2014 Left ventricle: The cavity size was normal. There was severe concentric hypertrophy with significant LVOT gradient 30 mmHg increasing to 105 mmHg with Valsalva maneuvers. Systolic function was vigorous. The estimated ejection fraction was in the range of 65% to 70%. Wall motion was normal; there were no regional wall motion abnormalities. Doppler parameters are consistent with abnormal left ventricular relaxation (grade 1 diastolic dysfunction). Doppler parameters are consistent with elevated ventricular end-diastolic filling pressure.  ------------------------------------------------------------------- Aortic valve: Trileaflet; severely thickened, severely calcified leaflets. Cusp separation was severely reduced. Mobility was not  restricted. Doppler: There was moderate to severe stenosis. There was mild regurgitation. Mean gradient (S): 34 mm Hg. Peak gradient (S): 56 mm Hg.  ECG - Sinus rhythm with first-degree AV block, 60 beats per minute, left axis deviation and left bundle branch block, this is unchanged from 2014.     Assessment & Plan  A very pleasant 80 year old gentleman with unfortunate combination of 2 diagnosis hypertrophic cardiomyopathy and moderate to severe aortic stenosis. Patient has a long-standing diagnosis of hypertrophic cardiomyopathy and her most recent echo his basal anterior septum measures 24 mm. There is chordal SAM and dynamic LVOT obstruction with 30 mm of mercury at rest and increases to 100 mmHg with Valsalva maneuver. Patient also has significantly thickened and calcified aortic valve with severe limited leaflet opening with mean gradient 36 mm of mercury.  The patient has severe aortic stenosis with missing S2.  Unfortunately patient of this age is probably not ideal candidate for myectomy.  He is currently not a candidate for TAVR procedure as his symptoms are mild and based on echo aortic stenosis is in moderate to severe range. He got  symptoms improvement after starting beta blockers and discontinuation of diuretics..  We will followup again in 3 months and repeat echocardiogram prior to next visit.   BP controlled.  Followup in 3 months  Dorothy Spark, MD, [facility] 05/06/2014, 12:17 PM

## 2014-05-06 NOTE — Patient Instructions (Addendum)
Your physician recommends that you continue on your current medications as directed. Please refer to the Current Medication list given to you today.   Your physician has requested that you have an echocardiogram. Echocardiography is a painless test that uses sound waves to create images of your heart. It provides your doctor with information about the size and shape of your heart and how well your heart's chambers and valves are working. This procedure takes approximately one hour. There are no restrictions for this procedure.  THIS NEEDS TO BE SCHEDULED PRIOR TO YOUR 3 MONTH FOLLOW-UP APPOINTMENT WITH DR Meda Coffee    Your physician recommends that you schedule a follow-up appointment in: 3 months with Dr Meda Coffee

## 2014-05-16 ENCOUNTER — Encounter: Payer: Self-pay | Admitting: Family Medicine

## 2014-05-16 ENCOUNTER — Ambulatory Visit (INDEPENDENT_AMBULATORY_CARE_PROVIDER_SITE_OTHER): Payer: Medicare Other | Admitting: Family Medicine

## 2014-05-16 VITALS — BP 110/80 | Temp 97.5°F | Ht 69.0 in | Wt 213.0 lb

## 2014-05-16 DIAGNOSIS — R351 Nocturia: Secondary | ICD-10-CM

## 2014-05-16 DIAGNOSIS — I10 Essential (primary) hypertension: Secondary | ICD-10-CM

## 2014-05-16 DIAGNOSIS — Z23 Encounter for immunization: Secondary | ICD-10-CM

## 2014-05-16 DIAGNOSIS — I350 Nonrheumatic aortic (valve) stenosis: Secondary | ICD-10-CM

## 2014-05-16 DIAGNOSIS — I6309 Cerebral infarction due to thrombosis of other precerebral artery: Secondary | ICD-10-CM

## 2014-05-16 DIAGNOSIS — N401 Enlarged prostate with lower urinary tract symptoms: Secondary | ICD-10-CM

## 2014-05-16 LAB — POCT URINALYSIS DIPSTICK
Bilirubin, UA: NEGATIVE
Glucose, UA: NEGATIVE
Ketones, UA: NEGATIVE
LEUKOCYTES UA: NEGATIVE
NITRITE UA: NEGATIVE
PROTEIN UA: NEGATIVE
RBC UA: NEGATIVE
Spec Grav, UA: 1.015
UROBILINOGEN UA: 0.2
pH, UA: 5.5

## 2014-05-16 LAB — CBC WITH DIFFERENTIAL/PLATELET
BASOS ABS: 0 10*3/uL (ref 0.0–0.1)
Basophils Relative: 0.5 % (ref 0.0–3.0)
EOS ABS: 0.1 10*3/uL (ref 0.0–0.7)
Eosinophils Relative: 1.8 % (ref 0.0–5.0)
HEMATOCRIT: 46.2 % (ref 39.0–52.0)
HEMOGLOBIN: 15.5 g/dL (ref 13.0–17.0)
LYMPHS ABS: 1.6 10*3/uL (ref 0.7–4.0)
Lymphocytes Relative: 24.2 % (ref 12.0–46.0)
MCHC: 33.5 g/dL (ref 30.0–36.0)
MCV: 89.5 fl (ref 78.0–100.0)
Monocytes Absolute: 0.5 10*3/uL (ref 0.1–1.0)
Monocytes Relative: 8.2 % (ref 3.0–12.0)
NEUTROS ABS: 4.2 10*3/uL (ref 1.4–7.7)
Neutrophils Relative %: 65.3 % (ref 43.0–77.0)
PLATELETS: 220 10*3/uL (ref 150.0–400.0)
RBC: 5.16 Mil/uL (ref 4.22–5.81)
RDW: 13.8 % (ref 11.5–15.5)
WBC: 6.4 10*3/uL (ref 4.0–10.5)

## 2014-05-16 LAB — BASIC METABOLIC PANEL
BUN: 20 mg/dL (ref 6–23)
CO2: 25 mEq/L (ref 19–32)
CREATININE: 1 mg/dL (ref 0.4–1.5)
Calcium: 9.5 mg/dL (ref 8.4–10.5)
Chloride: 105 mEq/L (ref 96–112)
GFR: 75.85 mL/min (ref >60.00)
GLUCOSE: 96 mg/dL (ref 70–99)
Potassium: 4 mEq/L (ref 3.5–5.1)
Sodium: 139 mEq/L (ref 135–145)

## 2014-05-16 LAB — TSH: TSH: 0.89 u[IU]/mL (ref 0.35–4.50)

## 2014-05-16 MED ORDER — TAMSULOSIN HCL 0.4 MG PO CAPS: ORAL_CAPSULE | ORAL | Status: DC

## 2014-05-16 MED ORDER — METOPROLOL TARTRATE 25 MG PO TABS: ORAL_TABLET | ORAL | Status: DC

## 2014-05-16 NOTE — Patient Instructions (Signed)
Flomax 0.4..........Marland Kitchen 1 Monday Wednesday Friday  Lopressor 12.5 mg........ one half tablet daily in the morning  Continue the Plavix from neurology  Followup in one month.

## 2014-05-16 NOTE — Progress Notes (Signed)
   Subjective:    Patient ID: Robert Anderson, male    DOB: 05/19/1924, 80 y.o.   MRN: [id number]  HPI Robert Anderson is a 80 year old male nonsmoker who comes in today for evaluation of hypertension and BPH with nocturia  He's on Plavix 75 mg daily from neurology because he had it stroke. He's also followed in cardiology because he has aortic stenosis. He currently takes Lopressor 12.5 mg twice a day BP today 110/80 and is a little lightheaded when he stands up.  He gets routine eye care, dental care, colonoscopy is in the past on normal therefore not repeated.  Vaccinations updated by Robert Anderson  He walks daily, home health safety reviewed no issues identified, no guns in the house, he does have a health care power of attorney and living well   Review of Systems  Constitutional: Negative.   HENT: Negative.   Eyes: Negative.   Respiratory: Negative.   Cardiovascular: Negative.   Gastrointestinal: Negative.   Genitourinary: Negative.   Musculoskeletal: Negative.   Skin: Negative.   Neurological: Negative.   Psychiatric/Behavioral: Negative.        Objective:   Physical Exam  Nursing note and vitals reviewed. Constitutional: He is oriented to person, place, and time. He appears well-developed and well-nourished.  HENT:  Head: Normocephalic and atraumatic.  Right Ear: External ear normal.  Left Ear: External ear normal.  Nose: Nose normal.  Mouth/Throat: Oropharynx is clear and moist.  Eyes: Conjunctivae and EOM are normal. Pupils are equal, round, and reactive to light.  Neck: Normal range of motion. Neck supple. No JVD present. No tracheal deviation present. No thyromegaly present.  Cardiovascular: Normal rate, regular rhythm, normal heart sounds and intact distal pulses.  Exam reveals no gallop and no friction rub.   No murmur heard. No carotid bruits aorta normal....... no bruits. Heart sounds extremely distant  Pulmonary/Chest: Effort normal and breath sounds normal. No stridor.  No respiratory distress. He has no wheezes. He has no rales. He exhibits no tenderness.  Abdominal: Soft. Bowel sounds are normal. He exhibits no distension and no mass. There is no tenderness. There is no rebound and no guarding.  Genitourinary: Rectum normal and penis normal. Guaiac negative stool. No penile tenderness.  2+ symmetrical nonnodular BPH  Musculoskeletal: Normal range of motion. He exhibits no edema and no tenderness.  Lymphadenopathy:    He has no cervical adenopathy.  Neurological: He is alert and oriented to person, place, and time. He has normal reflexes. No cranial nerve deficit. He exhibits normal muscle tone.  Skin: Skin is warm and dry. No rash noted. No erythema. No pallor.  Total body skin exam normal  Psychiatric: He has a normal mood and affect. His behavior is normal. Judgment and thought content normal.   Right ear canal is completely blocked with cerumen........... irrigated.       Assessment & Plan:  History of stroke....... continue Plavix followed by neurology  Aortic stenosis........ followed by cardiology  Hypertension......... BP too low...Marland KitchenMarland KitchenMarland Kitchen may be medication......Marland Kitchen may be underlying cardiomyopathy....... however since he is lightheaded when he stands up we will decrease the Lopressor to Toprol 25 mg once daily followup in one month  6 BPH with outlet structure and.......... Flomax continue but decrease dose to Monday Wednesday Friday  Profound hearing loss....... Continue hearing aid.

## 2014-05-16 NOTE — Progress Notes (Signed)
Pre visit review using our clinic review tool, if applicable. No additional management support is needed unless otherwise documented below in the visit note. 

## 2014-05-17 ENCOUNTER — Telehealth: Payer: Self-pay | Admitting: Family Medicine

## 2014-05-17 NOTE — Telephone Encounter (Signed)
emmi mailed  °

## 2014-05-23 ENCOUNTER — Encounter: Payer: Self-pay | Admitting: Family Medicine

## 2014-05-23 ENCOUNTER — Ambulatory Visit (INDEPENDENT_AMBULATORY_CARE_PROVIDER_SITE_OTHER): Payer: Medicare Other | Admitting: Family Medicine

## 2014-05-23 VITALS — BP 108/78 | Temp 97.5°F | Wt 214.2 lb

## 2014-05-23 DIAGNOSIS — R1032 Left lower quadrant pain: Secondary | ICD-10-CM

## 2014-05-23 NOTE — Progress Notes (Signed)
   Subjective:    Patient ID: Robert Anderson, male    DOB: 07/10/1924, 80 y.o.   MRN: [id number]  HPI This 80 year old male comes in today for evaluation of left lower: Abdominal pain  He states he's had left lower quadrant abdominal pain for 2-3 weeks now. No history of trauma. No nausea vomiting diarrhea or change in bowel or urinary habits. He describes the pain as dull and ache for 3-4 on a scale of 1-10. He says it hurts when he moves. The pain is intermittent. He had a recent colonoscopy that he states was normal. He otherwise feels well. Able to sleep at night no night sweats etc. etc.   Review of Systems Review of systems negative    Objective:   Physical Exam  Well-developed and nourished male no acute distress vital signs stable is afebrile examination the abdomen the abdomen is soft the bowel sounds are normal liver spleen kidneys not enlarged to palpation no masses and no tenderness. Rectal exam normal stool guaiac-negative      Assessment & Plan:  Left lower quadrant abdominal pain unknown etiology........... sounds muscular because it hurts with movement............. treat symptomatically.........Marland Kitchen with Tylenol......... GI consult if symptoms persist

## 2014-05-23 NOTE — Patient Instructions (Signed)
.......   Tylenol........... 2 tabs 3 times daily when necessary  If after 2 weeks your symptoms persist then call and see a gastroenterologist for further evaluation ............Marland Kitchen

## 2014-05-23 NOTE — Progress Notes (Signed)
Pre visit review using our clinic review tool, if applicable. No additional management support is needed unless otherwise documented below in the visit note. 

## 2014-06-05 ENCOUNTER — Other Ambulatory Visit (HOSPITAL_COMMUNITY): Payer: Medicare Other

## 2014-06-18 ENCOUNTER — Encounter: Payer: Self-pay | Admitting: Family Medicine

## 2014-06-18 ENCOUNTER — Ambulatory Visit (INDEPENDENT_AMBULATORY_CARE_PROVIDER_SITE_OTHER): Payer: Medicare Other | Admitting: Family Medicine

## 2014-06-18 VITALS — BP 110/70 | Temp 98.7°F | Wt 214.0 lb

## 2014-06-18 DIAGNOSIS — I421 Obstructive hypertrophic cardiomyopathy: Secondary | ICD-10-CM

## 2014-06-18 NOTE — Progress Notes (Signed)
Pre visit review using our clinic review tool, if applicable. No additional management support is needed unless otherwise documented below in the visit note. 

## 2014-06-18 NOTE — Progress Notes (Signed)
   Subjective:    Patient ID: Robert Anderson, male    DOB: 05-Apr-1924, 80 y.o.   MRN: [id number]  HPI Robert Anderson is a 80 year old male who comes in today for follow-up  He has a history of underlying hypertension and hypertrophic cardiomyopathy. He's followed in cardiology in here. His current medications are Plavix 75 mg daily, 1 stool softener, 12.5 mg of Lopressor daily and Flomax 0.5 daily. His Flomax is actually taking Monday Wednesday Friday. He gets your good urine flow with the Flomax. His BP today 110/70 pulse 70 regular he still a little lightheaded when he stands up.   Review of Systems    review of systems otherwise negative Objective:   Physical Exam Well-developed well-nourished male no acute distress vital signs stable is afebrile BP 110/70 pulse 70 and regular       Assessment & Plan:  Hypertension with underlying hypertropic cardiomyopathy,,,,,,,,,, decrease beta blocker to 12.5 mg Monday Wednesday Friday

## 2014-06-18 NOTE — Patient Instructions (Signed)
Lopressor 25 mg.................. One half tablet Monday Wednesday Friday  Check your blood pressure weekly  Return in one year sooner if any problem

## 2014-07-30 ENCOUNTER — Other Ambulatory Visit: Payer: Self-pay | Admitting: Dermatology

## 2014-08-05 ENCOUNTER — Ambulatory Visit (INDEPENDENT_AMBULATORY_CARE_PROVIDER_SITE_OTHER): Payer: Medicare Other | Admitting: Cardiology

## 2014-08-05 ENCOUNTER — Ambulatory Visit (HOSPITAL_COMMUNITY): Payer: Medicare Other | Attending: Cardiovascular Disease | Admitting: Radiology

## 2014-08-05 ENCOUNTER — Encounter: Payer: Self-pay | Admitting: Cardiology

## 2014-08-05 VITALS — BP 130/74 | HR 65 | Ht 69.0 in | Wt 215.0 lb

## 2014-08-05 DIAGNOSIS — I421 Obstructive hypertrophic cardiomyopathy: Secondary | ICD-10-CM

## 2014-08-05 DIAGNOSIS — R011 Cardiac murmur, unspecified: Secondary | ICD-10-CM

## 2014-08-05 DIAGNOSIS — I10 Essential (primary) hypertension: Secondary | ICD-10-CM | POA: Insufficient documentation

## 2014-08-05 DIAGNOSIS — I350 Nonrheumatic aortic (valve) stenosis: Secondary | ICD-10-CM

## 2014-08-05 DIAGNOSIS — E785 Hyperlipidemia, unspecified: Secondary | ICD-10-CM

## 2014-08-05 MED ORDER — PRAVASTATIN SODIUM 10 MG PO TABS
10.0000 mg | ORAL_TABLET | Freq: Every day | ORAL | Status: DC
Start: 2014-08-05 — End: 2015-02-04

## 2014-08-05 NOTE — Patient Instructions (Signed)
Your physician has recommended you make the following change in your medication:   START TAKING PRAVASTATIN 10MG  DAILY.    Your physician recommends that you return for lab work in: 1 MONTH ([place])    Your physician wants you to follow-up in: [place]. You will receive a reminder letter in the mail two months in advance. If you don't receive a letter, please call our office to schedule the follow-up appointment.

## 2014-08-05 NOTE — Progress Notes (Signed)
Echocardiogram performed.  

## 2014-08-05 NOTE — Progress Notes (Signed)
Patient ID: Robert Anderson, male   DOB: 1924/06/17, 80 y.o.   MRN: [id number]    Patient Name: Robert Anderson Date of Encounter: 08/05/2014  Primary Care Provider:  Joycelyn Man, MD Primary Cardiologist:  Dorothy Spark  Problem List   Past Medical History  Diagnosis Date  . HYPERTENSION 05/24/2007    Qualifier: Diagnosis of  By: Scherrie Gerlach    . DEGENERATIVE JOINT DISEASE, SPINE 05/24/2007    Qualifier: Diagnosis of  By: Scherrie Gerlach    . ERECTILE DYSFUNCTION 05/24/2007    Qualifier: Diagnosis of  By: Scherrie Gerlach    . HEARING LOSS 05/24/2007    Qualifier: Diagnosis of  By: Scherrie Gerlach    . Aphasia 01/21/2014    5 min spell without assoc sx    . CATARACTS, BILATERAL 05/24/2007    Qualifier: Diagnosis of  By: Scherrie Gerlach    . Heart murmur 01/21/2014    undefined apparently present for years    . History of prostate cancer 10/08/2010  . Spell of change in speech 01/21/2014    see above no slurring just couldnt get words out.   . Sleep apnea   . OA (osteoarthritis)    Past Surgical History  Procedure Laterality Date  . Prostate surgery    . Joint replacement    . Hemiarthroplasty shoulder fracture    . Cataract extraction      right 2004, left 2010   Allergies  No Known Allergies  HPI  A very pleasant 80 year old gentleman with prior medical history of hypertension, hyperlipidemia and hypertrophic cardiomyopathy diagnosed 20 years ago who has been referred to last for an episode of aphasia. The patient has been very active playing golf and tennis his whole life but he stopped recently due to advice of his primary care physician. fatigue. Lately he has been going to the [facility] 3 times a week where he rides a stationary bike and and does some strengthening exercises. He feels dizziness approximately once a months but increasing in frequency lately. He denies any palpitations or syncope. He describes one episode when his wife asked him to walk from the car to the  house and he was just sitting and couldn't say words despite being aware of the situation. He denies any lower extremity edema paroxysmal nocturnal dyspnea or orthopnea.   05/06/2014 - the patient states that since he started the Toprol she actually feels better. The patient continues exercising at the [facility] 3 times per week for 30-40 minutes daily. He denies any episodes of dizziness, chest pain, dyspnea on exertion, fall or syncope he's pretty much asymptomatic at this point.  08/05/2014 - the patient is coming after 3 months, he continues to feel well, no CP, SOB, orthopnea, PND, no LE edema, no dizziness or syncope. His limiting factor is hip pain.   Home Medications  Prior to Admission medications   Medication Sig Start Date End Date Taking? Authorizing Provider  aspirin 81 MG tablet Take 81 mg by mouth daily.     Historical Provider, MD  docusate sodium (COLACE) 100 MG capsule Take 100 mg by mouth 3 (three) times daily as needed.      Historical Provider, MD  hydrochlorothiazide (HYDRODIURIL) 25 MG tablet TAKE 1 TABLET EVERY MORNING 06/09/13   Dorena Cookey, MD  tamsulosin (FLOMAX) 0.4 MG CAPS capsule TAKE 1 CAPSULE (0.4 MG TOTAL) BY MOUTH AT BEDTIME. 05/03/13   Marletta Lor, MD  Family History  Family History  Problem Relation Age of Onset  . Heart disease Mother   . Cancer Father   . Cancer Sister   . Prostate cancer Brother   . Heart disease Brother   . Lung cancer Brother   . CVA Mother   . CVA Father     Social History  History   Social History  . Marital Status: Married    Spouse Name: N/A    Number of Children: N/A  . Years of Education: N/A   Occupational History  . Not on file.   Social History Main Topics  . Smoking status: Never Smoker   . Smokeless tobacco: Not on file  . Alcohol Use: No  . Drug Use: No  . Sexual Activity: Not on file   Other Topics Concern  . Not on file   Social History Narrative     Review of Systems, as per HPI,  otherwise negative General:  No chills, fever, night sweats or weight changes.  Cardiovascular:  No chest pain, dyspnea on exertion, edema, orthopnea, palpitations, paroxysmal nocturnal dyspnea. Dermatological: No rash, lesions/masses Respiratory: No cough, dyspnea Urologic: No hematuria, dysuria Abdominal:   No nausea, vomiting, diarrhea, bright red blood per rectum, melena, or hematemesis Neurologic:  No visual changes, wkns, changes in mental status. All other systems reviewed and are otherwise negative except as noted above.  Physical Exam  Blood pressure 130/74, pulse 65, height 5\' 9"  (1.753 m), weight 215 lb (97.523 kg).  General: Pleasant, NAD Psych: Normal affect. Neuro: Alert and oriented X 3. Moves all extremities spontaneously. HEENT: Normal  Neck: Supple without bruits or JVD. Lungs:  Resp regular and unlabored, CTA. Heart: RRR no S2, no s3, s4, 5/6 holosystolic murmur, increases with Valsalva maneuver.. Abdomen: Soft, non-tender, non-distended, BS + x 4.  Extremities: No clubbing, cyanosis or edema. DP/PT/Radials 2+ and equal bilaterally.  Labs:  No results for input(s): CKTOTAL, CKMB, TROPONINI in the last 72 hours. Lab Results  Component Value Date   WBC 6.4 05/16/2014   HGB 15.5 05/16/2014   HCT 46.2 05/16/2014   MCV 89.5 05/16/2014   PLT 220.0 05/16/2014    No results found for: DDIMER Invalid input(s): POCBNP    Component Value Date/Time   NA 139 05/16/2014 1024   K 4.0 05/16/2014 1024   CL 105 05/16/2014 1024   CO2 25 05/16/2014 1024   GLUCOSE 96 05/16/2014 1024   BUN 20 05/16/2014 1024   CREATININE 1.0 05/16/2014 1024   CALCIUM 9.5 05/16/2014 1024   PROT 6.6 05/03/2013 0951   ALBUMIN 4.1 05/03/2013 0951   AST 19 05/03/2013 0951   ALT 19 05/03/2013 0951   ALKPHOS 54 05/03/2013 0951   BILITOT 1.0 05/03/2013 0951   GFRNONAA 67.91 10/07/2009 0849   GFRAA 92 10/03/2008 0856   Lab Results  Component Value Date   CHOL 188 01/22/2014   HDL 39*  01/22/2014   LDLCALC 85 01/22/2014   TRIG 321* 01/22/2014    Accessory Clinical Findings  Echocardiogram - 01/29/2014 Left ventricle: The cavity size was normal. There was severe concentric hypertrophy with significant LVOT gradient 30 mmHg increasing to 105 mmHg with Valsalva maneuvers. Systolic function was vigorous. The estimated ejection fraction was in the range of 65% to 70%. Wall motion was normal; there were no regional wall motion abnormalities. Doppler parameters are consistent with abnormal left ventricular relaxation (grade 1 diastolic dysfunction). Doppler parameters are consistent with elevated ventricular end-diastolic filling pressure.  ------------------------------------------------------------------- Aortic  valve: Trileaflet; severely thickened, severely calcified leaflets. Cusp separation was severely reduced. Mobility was not restricted. Doppler: There was moderate to severe stenosis. There was mild regurgitation. Mean gradient (S): 34 mm Hg. Peak gradient (S): 56 mm Hg.  ECG - Sinus rhythm with first-degree AV block, 60 beats per minute, left axis deviation and left bundle branch block, this is unchanged from 2014.    Assessment & Plan  A very pleasant 80 year old gentleman with unfortunate combination of 2 diagnosis hypertrophic cardiomyopathy and severe aortic stenosis.  Patient has a long-standing diagnosis of hypertrophic cardiomyopathy and her most recent echo his basal anterior septum measures 24 mm. There is chordal SAM and dynamic LVOT obstruction with 30 mm of mercury at rest and increases to 100 mmHg with Valsalva maneuver.  Patient also has significantly thickened and calcified aortic valve with severe limited leaflet opening with mean gradient 36 mm of mercury. The patient has severe aortic stenosis with missing S2.   Unfortunately patient of this age is probably not ideal candidate for myectomy.   He is currently not a candidate for TAVR  procedure as his symptoms are mild, however his AS is now severe. We will follow very closely, he is educated about symptoms and he will call us if any occur.  He has got symptoms improvement after starting beta blockers and discontinuation of diuretics, however metoprolol dose was decreased because of hypotension.  We will start him on low dose of pravastatin 10 mg po daily and follow LFTs in 1 month.  We will followup again in 6 months  BP controlled.   Dorothy Spark, MD, [facility] 08/05/2014, 4:12 PM

## 2014-08-27 ENCOUNTER — Encounter: Payer: Self-pay | Admitting: Neurology

## 2014-08-27 ENCOUNTER — Ambulatory Visit (INDEPENDENT_AMBULATORY_CARE_PROVIDER_SITE_OTHER): Payer: Medicare Other | Admitting: Neurology

## 2014-08-27 VITALS — BP 132/66 | HR 74 | Resp 16 | Ht 69.0 in | Wt 214.0 lb

## 2014-08-27 DIAGNOSIS — I639 Cerebral infarction, unspecified: Secondary | ICD-10-CM

## 2014-08-27 NOTE — Patient Instructions (Signed)
1. Continue all your current medications 2. Continue to control BP, cholesterol 3. Continue exercise and healthy diet 4. Follow-up in 1 year 5. If symptoms change, go to ER immediately

## 2014-08-27 NOTE — Progress Notes (Signed)
NEUROLOGY FOLLOW UP OFFICE NOTE  Robert Anderson [id number]  HISTORY OF PRESENT ILLNESS: I had the pleasure of seeing Robert Anderson in follow-up in the neurology clinic on 08/27/2014.  The patient was last seen 6 months ago after an episode of transient aphasia last 01/19/14. His MRI brain had shown a small cortical focus of FLAIR signal in the high posterior left frontal lobe suggestive of late subacute infarct. Stroke workup unremarkable except for moderately dilated left atrium. He continues to follow-up with Cardiology. He was switched from aspirin to Plavix, which he is tolerating. He is doing very well with no further stroke-like symptoms. He exercises regularly at the [facility]. No falls. He denies any headaches, diplopia, dysarthria, focal numbness/tingling/weakness.  HPI: This is a very pleasant 81 yo RH man with a history of hypertension, in his usual state of health until 01/19/2014 they were getting ready to eat and went into the car. His wife was in the backseat asking him if he had the house keys and he could not answer her, stating that "the words wouldn't come out." He tried to reach his arm out to stop her but could not say a single word, then 5 minutes later he was able to talk. He denied any associated headache, focal numbness/tingling/weakness, or dizziness. His wife reported that his demeanor appeared like he was thinking, she thought he didn't hear her but appeared to be trying to say something but couldn't say it. She asked him to stick his tongue out which he was able to do, then he started talking.   Diagnostic Data: I personally reviewed MRI brain without contrast done 10 days after event which showed a small cortical focus of FLAIR signal in the high posterior left frontal lobe suggestive of late subacute infarct. MRA brain unremarkable. Carotid dopplers showed heterogenous plaque bilaterally with no significant stenosis. Echocardiogram showed severe concentric LVH, hyperdynamic  LVEF, moderate to severe aortic stenosis. Left atrium was moderately dilated. He was evaluated by cardiology, diuretics discontinued and started on Metoprolol.HbA1c 6.0, lipid panel showed LDL 85. Triglycerides were elevated at 321, however he was not fasting prior to the test.  PAST MEDICAL HISTORY: Past Medical History  Diagnosis Date  . HYPERTENSION 05/24/2007    Qualifier: Diagnosis of  By: Scherrie Gerlach    . DEGENERATIVE JOINT DISEASE, SPINE 05/24/2007    Qualifier: Diagnosis of  By: Scherrie Gerlach    . ERECTILE DYSFUNCTION 05/24/2007    Qualifier: Diagnosis of  By: Scherrie Gerlach    . HEARING LOSS 05/24/2007    Qualifier: Diagnosis of  By: Scherrie Gerlach    . Aphasia 01/21/2014    5 min spell without assoc sx    . CATARACTS, BILATERAL 05/24/2007    Qualifier: Diagnosis of  By: Scherrie Gerlach    . Heart murmur 01/21/2014    undefined apparently present for years    . History of prostate cancer 10/08/2010  . Spell of change in speech 01/21/2014    see above no slurring just couldnt get words out.   . Sleep apnea   . OA (osteoarthritis)     MEDICATIONS: Current Outpatient Prescriptions on File Prior to Visit  Medication Sig Dispense Refill  . clopidogrel (PLAVIX) 75 MG tablet Take 1 tablet (75 mg total) by mouth daily. (Patient taking differently: Take 75 mg by mouth every morning. ) 90 tablet 3  . docusate sodium (COLACE) 100 MG capsule Take 100 mg by mouth every morning.     Marland Kitchen  metoprolol tartrate (LOPRESSOR) 25 MG tablet One half tab every morning (Patient taking differently: Take 12.5 mg by mouth 3 (three) times a week. One half tab every Monday, Wednesday, Friday morning) 100 tablet 3  . pravastatin (PRAVACHOL) 10 MG tablet Take 1 tablet (10 mg total) by mouth daily. 90 tablet 3  . tamsulosin (FLOMAX) 0.4 MG CAPS capsule TAKE 1 CAPSULE (0.4 MG TOTAL) BY MOUTH AT BEDTIME. (Patient taking differently: Take 0.4 mg by mouth 3 (three) times a week. Every Monday, Wednesday, Friday  morning) 90 capsule 4   No current facility-administered medications on file prior to visit.    ALLERGIES: No Known Allergies  FAMILY HISTORY: Family History  Problem Relation Age of Onset  . Heart disease Mother   . Cancer Father   . Cancer Sister   . Prostate cancer Brother   . Heart disease Brother   . Lung cancer Brother   . CVA Mother   . CVA Father     SOCIAL HISTORY: History   Social History  . Marital Status: Married    Spouse Name: N/A    Number of Children: N/A  . Years of Education: N/A   Occupational History  . Not on file.   Social History Main Topics  . Smoking status: Never Smoker   . Smokeless tobacco: Not on file  . Alcohol Use: No  . Drug Use: No  . Sexual Activity: No   Other Topics Concern  . Not on file   Social History Narrative    REVIEW OF SYSTEMS: Constitutional: No fevers, chills, or sweats, no generalized fatigue, change in appetite Eyes: No visual changes, double vision, eye pain Ear, nose and throat: No hearing loss, ear pain, nasal congestion, sore throat Cardiovascular: No chest pain, palpitations Respiratory:  No shortness of breath at rest or with exertion, wheezes GastrointestinaI: No nausea, vomiting, diarrhea, abdominal pain, fecal incontinence Genitourinary:  No dysuria, urinary retention or frequency Musculoskeletal:  No neck pain, back pain Integumentary: No rash, pruritus, skin lesions Neurological: as above Psychiatric: No depression, insomnia, anxiety Endocrine: No palpitations, fatigue, diaphoresis, mood swings, change in appetite, change in weight, increased thirst Hematologic/Lymphatic:  No anemia, purpura, petechiae. Allergic/Immunologic: no itchy/runny eyes, nasal congestion, recent allergic reactions, rashes  PHYSICAL EXAM: Filed Vitals:   08/27/14 1428  BP: 132/66  Pulse: 74  Resp: 16   General: No acute distress Head:  Normocephalic/atraumatic Neck: supple, no paraspinal tenderness, full range of  motion Heart:  Regular rate and rhythm Lungs:  Clear to auscultation bilaterally Back: No paraspinal tenderness Skin/Extremities: No rash, no edema Neurological Exam: alert and oriented to person, place, and time. No aphasia or dysarthria. Fund of knowledge is appropriate.  Recent and remote memory are intact.  Attention and concentration are normal.    Able to name objects and repeat phrases. Cranial nerves: Pupils equal, round, reactive to light.  Fundoscopic exam unremarkable, no papilledema. Extraocular movements intact with no nystagmus. Visual fields full. Facial sensation intact. No facial asymmetry. Tongue, uvula, palate midline.  Motor: Bulk and tone normal, muscle strength 5/5 throughout with no pronator drift.  Sensation to light touch intact.  No extinction to double simultaneous stimulation.  Deep tendon reflexes 2+ throughout, toes downgoing.  Finger to nose testing intact.  Gait narrow-based and steady, able to tandem walk adequately.  Romberg negative.  IMPRESSION: This is a pleasant 81 yo RH man who presented with a transient 5-minute episode of inability to speak last June 2015. MRI brain showed a  small cortical infarct in the left posterior frontal region. MRA and carotid dopplers did not show significant obstruction, echo shows moderate aortic stenosis and dilated left atrium. He denies any palpitations or sensation of irregular heart rhythm. He has been switched from aspirin to Plavix for secondary stroke prevention, and will continue to monitor cardiac status with his cardiologist. Continue control of vascular risk factors, exercise. He knows to go to ER immediately if symptoms recur. He will follow-up in 1 year or earlier if needed.  Thank you for allowing me to participate in his care.  Please do not hesitate to call for any questions or concerns.  The duration of this appointment visit was 15 minutes of face-to-face time with the patient.  Greater than 50% of this time was  spent in counseling, explanation of diagnosis, planning of further management, and coordination of care.   Ellouise Newer, M.D.   CC: Dr. Sherren Mocha

## 2014-09-03 ENCOUNTER — Encounter: Payer: Self-pay | Admitting: Neurology

## 2014-09-05 ENCOUNTER — Other Ambulatory Visit (INDEPENDENT_AMBULATORY_CARE_PROVIDER_SITE_OTHER): Payer: Medicare Other | Admitting: *Deleted

## 2014-09-05 DIAGNOSIS — I421 Obstructive hypertrophic cardiomyopathy: Secondary | ICD-10-CM | POA: Diagnosis not present

## 2014-09-05 DIAGNOSIS — I10 Essential (primary) hypertension: Secondary | ICD-10-CM | POA: Diagnosis not present

## 2014-09-05 DIAGNOSIS — I350 Nonrheumatic aortic (valve) stenosis: Secondary | ICD-10-CM

## 2014-09-05 LAB — COMPREHENSIVE METABOLIC PANEL
ALT: 15 U/L (ref 0–53)
AST: 16 U/L (ref 0–37)
Albumin: 3.9 g/dL (ref 3.5–5.2)
Alkaline Phosphatase: 60 U/L (ref 39–117)
BUN: 23 mg/dL (ref 6–23)
CO2: 26 mEq/L (ref 19–32)
Calcium: 9.6 mg/dL (ref 8.4–10.5)
Chloride: 106 mEq/L (ref 96–112)
Creatinine, Ser: 1.03 mg/dL (ref 0.40–1.50)
GFR: 72.41 mL/min (ref >60.00)
Glucose, Bld: 108 mg/dL — ABNORMAL HIGH (ref 70–99)
Potassium: 4.2 mEq/L (ref 3.5–5.1)
Sodium: 140 mEq/L (ref 135–145)
Total Bilirubin: 0.5 mg/dL (ref 0.2–1.2)
Total Protein: 6.3 g/dL (ref 6.0–8.3)

## 2014-09-05 NOTE — Addendum Note (Signed)
Addended by: Eulis Foster on: 09/05/2014 12:49 PM   Modules accepted: Orders

## 2014-10-17 ENCOUNTER — Other Ambulatory Visit: Payer: Self-pay | Admitting: Internal Medicine

## 2014-12-01 ENCOUNTER — Ambulatory Visit (INDEPENDENT_AMBULATORY_CARE_PROVIDER_SITE_OTHER): Payer: Medicare Other

## 2014-12-01 ENCOUNTER — Ambulatory Visit (INDEPENDENT_AMBULATORY_CARE_PROVIDER_SITE_OTHER): Payer: Medicare Other | Admitting: Emergency Medicine

## 2014-12-01 VITALS — BP 94/66 | HR 70 | Temp 97.6°F | Resp 15 | Ht 69.0 in | Wt 215.2 lb

## 2014-12-01 DIAGNOSIS — J111 Influenza due to unidentified influenza virus with other respiratory manifestations: Secondary | ICD-10-CM

## 2014-12-01 DIAGNOSIS — R059 Cough, unspecified: Secondary | ICD-10-CM

## 2014-12-01 DIAGNOSIS — J1189 Influenza due to unidentified influenza virus with other manifestations: Secondary | ICD-10-CM | POA: Diagnosis not present

## 2014-12-01 DIAGNOSIS — R05 Cough: Secondary | ICD-10-CM

## 2014-12-01 LAB — POCT CBC
Granulocyte percent: 66.9 %G (ref 37–80)
HCT, POC: 46.1 % (ref 43.5–53.7)
Hemoglobin: 15.1 g/dL (ref 14.1–18.1)
Lymph, poc: 1.2 (ref 0.6–3.4)
MCH, POC: 29.1 pg (ref 27–31.2)
MCHC: 32.9 g/dL (ref 31.8–35.4)
MCV: 88.5 fL (ref 80–97)
MID (cbc): 0.4 (ref 0–0.9)
MPV: 7.3 fL (ref 0–99.8)
PLATELET COUNT, POC: 185 10*3/uL (ref 142–424)
POC GRANULOCYTE: 3.2 (ref 2–6.9)
POC LYMPH PERCENT: 25.8 %L (ref 10–50)
POC MID %: 7.3 % (ref 0–12)
RBC: 5.21 M/uL (ref 4.69–6.13)
RDW, POC: 14 %
WBC: 4.8 10*3/uL (ref 4.6–10.2)

## 2014-12-01 LAB — POCT INFLUENZA A/B
Influenza A, POC: NEGATIVE
Influenza B, POC: POSITIVE

## 2014-12-01 NOTE — Progress Notes (Signed)
   Subjective:    Patient ID: Robert Anderson, male    DOB: 08/12/1924, 81 y.o.   MRN: [id number] This chart was scribed for Robert Honour, MD by Robert Anderson, ED Scribe. This patient was seen in room 11 and patient care was started at 11:29 AM.   HPI HPI Comments: Robert Anderson is a 81 y.o. male who presents to the [facility] complaining of cough, nasal congestion and fatigue onset 5 days ago. He states that he has had a persistent cough with some sputum and body aches. He denies fever, sore throat, emesis and nausea. He reports that he has taken a flu shot. His wife reports that he tends to make light of his health problems, and that it is a bit more serious than he's making it seem. He states that Dr. Sherren Anderson is his PCP.   Review of Systems  Constitutional: Positive for fatigue. Negative for fever.  HENT: Positive for congestion. Negative for sore throat.   Respiratory: Positive for cough.   Gastrointestinal: Negative for nausea and vomiting.      Objective:   Physical Exam  Constitutional: He appears well-developed and well-nourished. No distress.  HENT:  Head: Normocephalic and atraumatic.  Nasal congestion  Eyes: Conjunctivae are normal. Right eye exhibits no discharge. Left eye exhibits no discharge.  Neck: Neck supple.  Cardiovascular: Normal rate and regular rhythm.  Exam reveals no gallop and no friction rub.   Murmur heard. 2/6 harsh systolic murmur  Pulmonary/Chest: Effort normal and breath sounds normal. No respiratory distress. He has no wheezes. He has no rales.  Abdominal: Soft. He exhibits no distension. There is no tenderness.  Musculoskeletal: He exhibits no edema or tenderness.  Neurological: He is alert.  Skin: Skin is warm and dry.  Psychiatric: He has a normal mood and affect. His behavior is normal. Thought content normal.  Nursing note and vitals reviewed. [facility] reading (PRIMARY) by  Robert Anderson patient has cardiomegaly with blunting of the left  costophrenic angle.Marland Kitchen Results for orders placed or performed in visit on 12/01/14  POCT CBC  Result Value Ref Range   WBC 4.8 4.6 - 10.2 K/uL   Lymph, poc 1.2 0.6 - 3.4   POC LYMPH PERCENT 25.8 10 - 50 %L   MID (cbc) 0.4 0 - 0.9   POC MID % 7.3 0 - 12 %M   POC Granulocyte 3.2 2 - 6.9   Granulocyte percent 66.9 37 - 80 %G   RBC 5.21 4.69 - 6.13 M/uL   Hemoglobin 15.1 14.1 - 18.1 g/dL   HCT, POC 46.1 43.5 - 53.7 %   MCV 88.5 80 - 97 fL   MCH, POC 29.1 27 - 31.2 pg   MCHC 32.9 31.8 - 35.4 g/dL   RDW, POC 14.0 %   Platelet Count, POC 185 142 - 424 K/uL   MPV 7.3 0 - 99.8 fL  POCT Influenza A/B  Result Value Ref Range   Influenza A, POC Negative    Influenza B, POC Positive       Assessment & Plan:  Patient tested positive for flu B. He was alerted to worsening of his flu symptoms. At the present time he will be on symptomatic treatment with Tylenol and fluids.I personally performed the services described in this documentation, which was scribed in my presence. The recorded information has been reviewed and is accurate.

## 2014-12-01 NOTE — Patient Instructions (Signed)

## 2014-12-05 ENCOUNTER — Ambulatory Visit (INDEPENDENT_AMBULATORY_CARE_PROVIDER_SITE_OTHER): Payer: Medicare Other | Admitting: Emergency Medicine

## 2014-12-05 VITALS — BP 118/72 | HR 78 | Temp 97.5°F | Resp 16 | Ht 69.0 in | Wt 213.0 lb

## 2014-12-05 DIAGNOSIS — J1189 Influenza due to unidentified influenza virus with other manifestations: Secondary | ICD-10-CM | POA: Diagnosis not present

## 2014-12-05 DIAGNOSIS — J111 Influenza due to unidentified influenza virus with other respiratory manifestations: Secondary | ICD-10-CM

## 2014-12-05 MED ORDER — HYDROCOD POLST-CPM POLST ER 10-8 MG/5ML PO SUER: 5.0000 mL | Freq: Two times a day (BID) | ORAL | Status: DC

## 2014-12-05 NOTE — Progress Notes (Signed)
[facility] [address], [place] [address] [phone]  Date:  12/05/2014   Name:  Robert Anderson   DOB:  12-25-1923   MRN:  [id number]  PCP:  Joycelyn Man, MD    Chief Complaint: Follow-up and Cough   History of Present Illness:  Jahkeem Kurka is a 81 y.o. very pleasant male patient who presents with the following:  Ill with influenza.  Tested positive Sunday Too late for tamiflu Says he feels better and no fever Still has persistent cough productive but not able to expectorate. No wheezing or shortness of breath. Cough worse at night. No nausea or vomiting. No improvement with over the counter medications or other home remedies. Denies other complaint or health concern today.   Patient Active Problem List   Diagnosis Date Noted  . Abdominal pain, left lower quadrant 05/23/2014  . CVA (cerebral infarction) 02/19/2014  . Aortic stenosis 02/01/2014  . Hypertrophic obstructive cardiomyopathy 02/01/2014  . Aphasia 01/21/2014  . Spell of change in speech 01/21/2014  . Heart murmur 01/21/2014  . Cough 12/28/2010  . History of prostate cancer 10/08/2010  . ERECTILE DYSFUNCTION 05/24/2007  . CATARACTS, BILATERAL 05/24/2007  . HEARING LOSS 05/24/2007  . Essential hypertension 05/24/2007  . DEGENERATIVE JOINT DISEASE, SPINE 05/24/2007    Past Medical History  Diagnosis Date  . HYPERTENSION 05/24/2007    Qualifier: Diagnosis of  By: Scherrie Gerlach    . DEGENERATIVE JOINT DISEASE, SPINE 05/24/2007    Qualifier: Diagnosis of  By: Scherrie Gerlach    . ERECTILE DYSFUNCTION 05/24/2007    Qualifier: Diagnosis of  By: Scherrie Gerlach    . HEARING LOSS 05/24/2007    Qualifier: Diagnosis of  By: Scherrie Gerlach    . Aphasia 01/21/2014    5 min spell without assoc sx    . CATARACTS, BILATERAL 05/24/2007    Qualifier: Diagnosis of  By: Scherrie Gerlach    . Heart murmur 01/21/2014    undefined apparently present for years    . History of prostate cancer  10/08/2010  . Spell of change in speech 01/21/2014    see above no slurring just couldnt get words out.   . Sleep apnea   . OA (osteoarthritis)   . Cancer     Past Surgical History  Procedure Laterality Date  . Prostate surgery    . Joint replacement    . Hemiarthroplasty shoulder fracture    . Cataract extraction      right 2004, left 2010    History  Substance Use Topics  . Smoking status: Never Smoker   . Smokeless tobacco: Not on file  . Alcohol Use: No    Family History  Problem Relation Age of Onset  . Heart disease Mother   . Cancer Father   . Cancer Sister   . Prostate cancer Brother   . Heart disease Brother   . Lung cancer Brother   . CVA Mother   . CVA Father     No Known Allergies  Medication list has been reviewed and updated.  Current Outpatient Prescriptions on File Prior to Visit  Medication Sig Dispense Refill  . clopidogrel (PLAVIX) 75 MG tablet Take 1 tablet (75 mg total) by mouth daily. (Patient taking differently: Take 75 mg by mouth every morning. ) 90 tablet 3  . docusate sodium (COLACE) 100 MG capsule Take 100 mg by mouth every morning.     . metoprolol  tartrate (LOPRESSOR) 25 MG tablet One half tab every morning (Patient taking differently: Take 12.5 mg by mouth 3 (three) times a week. One half tab every Monday, Wednesday, Friday morning) 100 tablet 3  . pravastatin (PRAVACHOL) 10 MG tablet Take 1 tablet (10 mg total) by mouth daily. 90 tablet 3  . tamsulosin (FLOMAX) 0.4 MG CAPS capsule TAKE 1 CAPSULE (0.4 MG TOTAL) BY MOUTH AT BEDTIME. 90 capsule 3   No current facility-administered medications on file prior to visit.    Review of Systems:  As per HPI, otherwise negative.    Physical Examination: Filed Vitals:   12/05/14 1425  BP: 118/72  Pulse: 78  Temp: 97.5 F (36.4 C)  Resp: 16   Filed Vitals:   12/05/14 1425  Height: 5\' 9"  (1.753 m)  Weight: 213 lb (96.616 kg)   Body mass index is 31.44 kg/(m^2). Ideal Body Weight:  Weight in (lb) to have BMI = 25: 168.9  GEN: WDWN, NAD, Non-toxic, A & O x 3 HEENT: Atraumatic, Normocephalic. Neck supple. No masses, No LAD. Ears and Nose: No external deformity. CV: RRR, No M/G/R. No JVD. No thrill. No extra heart sounds. PULM: CTA B, no wheezes, crackles, rhonchi. No retractions. No resp. distress. No accessory muscle use. ABD: S, NT, ND, +BS. No rebound. No HSM. EXTR: No c/c/e NEURO Normal gait.  PSYCH: Normally interactive. Conversant. Not depressed or anxious appearing.  Calm demeanor.    Assessment and Plan: Influenza tussionex mucinex Red flags discussed   Signed,  Ellison Carwin, MD

## 2014-12-05 NOTE — Patient Instructions (Signed)

## 2014-12-19 ENCOUNTER — Other Ambulatory Visit (HOSPITAL_COMMUNITY): Payer: Self-pay | Admitting: Dermatology

## 2014-12-19 DIAGNOSIS — C44729 Squamous cell carcinoma of skin of left lower limb, including hip: Secondary | ICD-10-CM | POA: Diagnosis not present

## 2014-12-31 DIAGNOSIS — Z961 Presence of intraocular lens: Secondary | ICD-10-CM | POA: Diagnosis not present

## 2014-12-31 DIAGNOSIS — H3531 Nonexudative age-related macular degeneration: Secondary | ICD-10-CM | POA: Diagnosis not present

## 2014-12-31 DIAGNOSIS — H524 Presbyopia: Secondary | ICD-10-CM | POA: Diagnosis not present

## 2015-01-28 DIAGNOSIS — L570 Actinic keratosis: Secondary | ICD-10-CM | POA: Diagnosis not present

## 2015-01-28 DIAGNOSIS — C44729 Squamous cell carcinoma of skin of left lower limb, including hip: Secondary | ICD-10-CM | POA: Diagnosis not present

## 2015-01-28 DIAGNOSIS — L821 Other seborrheic keratosis: Secondary | ICD-10-CM | POA: Diagnosis not present

## 2015-01-28 DIAGNOSIS — Z85828 Personal history of other malignant neoplasm of skin: Secondary | ICD-10-CM | POA: Diagnosis not present

## 2015-02-04 ENCOUNTER — Ambulatory Visit (INDEPENDENT_AMBULATORY_CARE_PROVIDER_SITE_OTHER): Payer: Medicare Other | Admitting: Cardiology

## 2015-02-04 ENCOUNTER — Encounter: Payer: Self-pay | Admitting: Cardiology

## 2015-02-04 VITALS — BP 122/60 | HR 85 | Ht 69.0 in | Wt 213.0 lb

## 2015-02-04 DIAGNOSIS — I350 Nonrheumatic aortic (valve) stenosis: Secondary | ICD-10-CM

## 2015-02-04 DIAGNOSIS — I421 Obstructive hypertrophic cardiomyopathy: Secondary | ICD-10-CM

## 2015-02-04 DIAGNOSIS — E785 Hyperlipidemia, unspecified: Secondary | ICD-10-CM | POA: Diagnosis not present

## 2015-02-04 NOTE — Progress Notes (Signed)
Patient ID: Robert Anderson, male   DOB: Jun 20, 1924, 81 y.o.   MRN: [id number]    Patient Name: Robert Anderson Date of Encounter: 02/04/2015  Primary Care Provider:  Evette Georges, MD Primary Cardiologist:  Lars Masson  Problem List   Past Medical History  Diagnosis Date  . HYPERTENSION 05/24/2007    Qualifier: Diagnosis of  By: Everett Graff    . DEGENERATIVE JOINT DISEASE, SPINE 05/24/2007    Qualifier: Diagnosis of  By: Everett Graff    . ERECTILE DYSFUNCTION 05/24/2007    Qualifier: Diagnosis of  By: Everett Graff    . HEARING LOSS 05/24/2007    Qualifier: Diagnosis of  By: Everett Graff    . Aphasia 01/21/2014    5 min spell without assoc sx    . CATARACTS, BILATERAL 05/24/2007    Qualifier: Diagnosis of  By: Everett Graff    . Heart murmur 01/21/2014    undefined apparently present for years    . History of prostate cancer 10/08/2010  . Spell of change in speech 01/21/2014    see above no slurring just couldnt get words out.   . Sleep apnea   . OA (osteoarthritis)   . Cancer    Past Surgical History  Procedure Laterality Date  . Prostate surgery    . Joint replacement    . Hemiarthroplasty shoulder fracture    . Cataract extraction      right 2004, left 2010   Allergies  No Known Allergies  HPI  A very pleasant 81 year old gentleman with prior medical history of hypertension, hyperlipidemia and hypertrophic cardiomyopathy diagnosed 20 years ago who has been referred to last for an episode of aphasia. The patient has been very active playing golf and tennis his whole life but he stopped recently due to advice of his primary care physician. fatigue. Lately he has been going to the [facility] 3 times a week where he rides a stationary bike and and does some strengthening exercises. He feels dizziness approximately once a months but increasing in frequency lately. He denies any palpitations or syncope. He describes one episode when his wife asked him to walk from the  car to the house and he was just sitting and couldn't say words despite being aware of the situation. He denies any lower extremity edema paroxysmal nocturnal dyspnea or orthopnea.   05/06/2014 - the patient states that since he started the Toprol he actually feels better. The patient continues exercising at the [facility] 3 times per week for 30-40 minutes daily. He denies any episodes of dizziness, chest pain, dyspnea on exertion, fall or syncope he's pretty much asymptomatic at this point.  08/05/2014 - the patient is coming after 3 months, he continues to feel well, no CP, SOB, orthopnea, PND, no LE edema, no dizziness or syncope. His limiting factor is hip pain.   02/04/2015 - the patient is coming after 6 months, in the interim he has developed SOB with minimal activities at home - like pushing trash can, he continues to go to the gym 3x/week where he performs very light exercises - stationary bike with upper HR limit 80 BPM with no symptoms of chest pain or dizziness. No syncope. He has [place] noticed worsening dizziness - while standing up. No PND, LE edema, no orthopnea.  Home Medications  Prior to Admission medications   Medication Sig Start Date End Date Taking? Authorizing Provider  aspirin 81 MG tablet Take 81 mg by  mouth daily.     Historical Provider, MD  docusate sodium (COLACE) 100 MG capsule Take 100 mg by mouth 3 (three) times daily as needed.      Historical Provider, MD  hydrochlorothiazide (HYDRODIURIL) 25 MG tablet TAKE 1 TABLET EVERY MORNING 06/09/13   Roderick Pee, MD  tamsulosin (FLOMAX) 0.4 MG CAPS capsule TAKE 1 CAPSULE (0.4 MG TOTAL) BY MOUTH AT BEDTIME. 05/03/13   Gordy Savers, MD    Family History  Family History  Problem Relation Age of Onset  . Heart disease Mother   . Cancer Father   . Cancer Sister   . Prostate cancer Brother   . Heart disease Brother   . Lung cancer Brother   . CVA Mother   . CVA Father     Social History  History   Social  History  . Marital Status: Married    Spouse Name: N/A  . Number of Children: N/A  . Years of Education: N/A   Occupational History  . Not on file.   Social History Main Topics  . Smoking status: Never Smoker   . Smokeless tobacco: Not on file  . Alcohol Use: No  . Drug Use: No  . Sexual Activity: No   Other Topics Concern  . Not on file   Social History Narrative     Review of Systems, as per HPI, otherwise negative General:  No chills, fever, night sweats or weight changes.  Cardiovascular:  No chest pain, dyspnea on exertion, edema, orthopnea, palpitations, paroxysmal nocturnal dyspnea. Dermatological: No rash, lesions/masses Respiratory: No cough, dyspnea Urologic: No hematuria, dysuria Abdominal:   No nausea, vomiting, diarrhea, bright red blood per rectum, melena, or hematemesis Neurologic:  No visual changes, wkns, changes in mental status. All other systems reviewed and are otherwise negative except as noted above.  Physical Exam  Height 5\' 9"  (1.753 m), weight 213 lb (96.616 kg).  General: Pleasant, NAD Psych: Normal affect. Neuro: Alert and oriented X 3. Moves all extremities spontaneously. HEENT: Normal  Neck: Supple without bruits or JVD. Lungs:  Resp regular and unlabored, CTA. Heart: RRR no S2, no s3, s4, 5/6 holosystolic murmur, increases with Valsalva maneuver.. Abdomen: Soft, non-tender, non-distended, BS + x 4.  Extremities: No clubbing, cyanosis or edema. DP/PT/Radials 2+ and equal bilaterally.  Labs:  No results for input(s): CKTOTAL, CKMB, TROPONINI in the last 72 hours. Lab Results  Component Value Date   WBC 4.8 12/01/2014   HGB 15.1 12/01/2014   HCT 46.1 12/01/2014   MCV 88.5 12/01/2014   PLT 220.0 05/16/2014    No results found for: DDIMER Invalid input(s): POCBNP    Component Value Date/Time   NA 140 09/05/2014 1249   K 4.2 09/05/2014 1249   CL 106 09/05/2014 1249   CO2 26 09/05/2014 1249   GLUCOSE 108* 09/05/2014 1249   BUN  23 09/05/2014 1249   CREATININE 1.03 09/05/2014 1249   CALCIUM 9.6 09/05/2014 1249   PROT 6.3 09/05/2014 1249   ALBUMIN 3.9 09/05/2014 1249   AST 16 09/05/2014 1249   ALT 15 09/05/2014 1249   ALKPHOS 60 09/05/2014 1249   BILITOT 0.5 09/05/2014 1249   GFRNONAA 67.91 10/07/2009 0849   GFRAA 92 10/03/2008 0856   Lab Results  Component Value Date   CHOL 188 01/22/2014   HDL 39* 01/22/2014   LDLCALC 85 01/22/2014   TRIG 321* 01/22/2014    Accessory Clinical Findings  Echocardiogram - 01/29/2014 Left ventricle: The cavity size was  normal. There was severe concentric hypertrophy with significant LVOT gradient 30 mmHg increasing to 105 mmHg with Valsalva maneuvers. Systolic function was vigorous. The estimated ejection fraction was in the range of 65% to 70%. Wall motion was normal; there were no regional wall motion abnormalities. Doppler parameters are consistent with abnormal left ventricular relaxation (grade 1 diastolic dysfunction). Doppler parameters are consistent with elevated ventricular end-diastolic filling pressure.  ------------------------------------------------------------------- Aortic valve: Trileaflet; severely thickened, severely calcified leaflets. Cusp separation was severely reduced. Mobility was not restricted. Doppler: There was moderate to severe stenosis. There was mild regurgitation. Mean gradient (S): 34 mm Hg. Peak gradient (S): 56 mm Hg.   TTE: 08/05/2014  - Left ventricle: The cavity size was normal. Wall thickness was increased in a pattern of severe LVH. Systolic function was normal. The estimated ejection fraction was in the range of 55% to 60%. There was dynamic obstruction during Valsalvain the outflow tract, with mid-cavity obliteration, a peak velocity of 448 cm/sec, and a peak gradient of 80 mm Hg. Doppler parameters are consistent with abnormal left ventricular relaxation (grade 1 diastolic dysfunction). - Aortic valve:  Likely trileaflet with fused right and left cusps. There was severe stenosis. - Left atrium: The atrium was moderately dilated. - Atrial septum: No defect or patent foramen ovale was identified. - Impressions: No LVOT gradient or SAM appreciated Primary issues appears to be fixed valvular aortic stenosis  Impressions: - No LVOT gradient or SAM appreciated Primary issues appears to be fixed valvular aortic stenosis  ECG - Sinus rhythm with first-degree AV block, 60 beats per minute, left axis deviation and left bundle branch block, this is unchanged from 2014.    Assessment & Plan  A very pleasant 82 year old gentleman with unfortunate combination of 2 diagnosis hypertrophic cardiomyopathy and severe aortic stenosis.   Patient has a long-standing diagnosis of hypertrophic cardiomyopathy and his echo in June 2015 showed basal anterior septum measuring 24 mm. There was chordal SAM and dynamic LVOT obstruction with 30 mm of mercury at rest and increased to 100 mmHg with Valsalva maneuver. On echo in December 2015 [facility] and LVOT obstruction has resolved with initiation of Toprol. However he had residual mid-cavitary gradient of 80 mmHg. There is no room to increase toprol XL further (BP borderline and orthostatic). He is advised to stay hydrated especially on hot days. Continue pravastatin.   The patient now has severe symptomatic aortic stenosis (stage D)  with worsening symptoms of worsening exertional dyspnea and dizziness.  We will refer him to Dr Excell Seltzer for TAVR evaluation.  We will followup again in 6 months  Robert Anderson, Robert Congress, MD, [facility] 02/04/2015, 1:53 PM

## 2015-02-04 NOTE — Patient Instructions (Signed)
Medication Instructions:  None  Labwork: None  Testing/Procedures: None  Follow-Up: Your physician wants you to follow-up in: 6 months with Dr. Meda Coffee. You will receive a reminder letter in the mail two months in advance. If you don't receive a letter, please call our office to schedule the follow-up appointment.   Any Other Special Instructions Will Be Listed Below (If Applicable).  You have been referred to Dr. [facility].

## 2015-02-05 NOTE — Progress Notes (Signed)
Cardiology Office Note Date:  02/10/2015   ID:  Robert Anderson, DOB [birth year], MRN [id number]  PCP:  Robert Man, MD  Cardiologist:  Sherren Mocha, MD    Chief Complaint  Patient presents with  . Shortness of Breath     History of Present Illness: Robert Anderson is a 81 y.o. male who presents for evaluation of severe aortic stenosis.   The patient has a longstanding history of hypertrophic cardiomyopathy. He was diagnosed over 20 years ago and was told to avoid exercise in the hot weather and to avoid strenuous physical exertion. He's done well over the years and did not see a cardiologist until he was evaluated by Dr Robert Anderson last year after a TIA. He presented with an episode of expressive aphasia and he had complete symptom resolution. He was also noted to have moderate to severe aortic stenosis when he had an echo at that time.  He has been physically active over the years - played tennis his entire life. He still goes to the [employer] 3x/week. He exercises on the elliptical and gets his heart rate up into the 80's but not above that. He doesn't do yard work any longer. States that he 'tires easily.' He's had a few episodes of near-syncope over the years but no problems in the past year. He denies any symptoms of angina. He does admit to exertional dyspnea, progressive over the past year. He is tired and short of breath with taking out the garbage can or doing a modest amount of physical work. He recently moved some furniture and had to lie down and rest afterwards because of weakness. He just bought a cane yesterday to help with instability when he walks. He remains independent and still drives a car.    Past Medical History  Diagnosis Date  . HYPERTENSION 05/24/2007    Qualifier: Diagnosis of  By: Scherrie Gerlach    . DEGENERATIVE JOINT DISEASE, SPINE 05/24/2007    Qualifier: Diagnosis of  By: Scherrie Gerlach    . ERECTILE DYSFUNCTION 05/24/2007    Qualifier: Diagnosis of   By: Scherrie Gerlach    . HEARING LOSS 05/24/2007    Qualifier: Diagnosis of  By: Scherrie Gerlach    . Aphasia 01/21/2014    5 min spell without assoc sx    . CATARACTS, BILATERAL 05/24/2007    Qualifier: Diagnosis of  By: Scherrie Gerlach    . Heart murmur 01/21/2014    undefined apparently present for years    . History of prostate cancer 10/08/2010  . Spell of change in speech 01/21/2014    see above no slurring just couldnt get words out.   . Sleep apnea   . OA (osteoarthritis)   . Cancer     Past Surgical History  Procedure Laterality Date  . Prostate surgery    . Joint replacement    . Hemiarthroplasty shoulder fracture    . Cataract extraction      right 2004, left 2010    Current Outpatient Prescriptions  Medication Sig Dispense Refill  . clopidogrel (PLAVIX) 75 MG tablet Take 75 mg by mouth every morning.    . docusate sodium (COLACE) 100 MG capsule Take 200 mg by mouth every morning.     . metoprolol tartrate (LOPRESSOR) 25 MG tablet Take 12.5 mg by mouth 3 (three) times a week. Monday , Wednesday , Friday    . pravastatin (PRAVACHOL) 10 MG tablet Take 10 mg by  mouth at bedtime.    . tamsulosin (FLOMAX) 0.4 MG CAPS capsule Take 0.4 mg by mouth 3 (three) times a week. Monday Wednesday Fridday     No current facility-administered medications for this visit.    Allergies:   Review of patient's allergies indicates no known allergies.   Social History:  The patient  reports that he has never smoked. He does not have any smokeless tobacco history on file. He reports that he does not drink alcohol or use illicit drugs.   Family History:  The patient's  family history includes CVA in his father and mother; Cancer in his father and sister; Heart disease in his brother and mother; Lung cancer in his brother; Prostate cancer in his brother.    ROS:  Please see the history of present illness.  Otherwise, review of systems is positive for neck pain, DOE, easy bruising, excessive  fatigue, and balance problems. Also has hip problems from arthritis.  All other systems are reviewed and negative.    PHYSICAL EXAM: VS:  BP 106/62 mmHg  Pulse 69  Ht 5\' 9"  (1.753 m)  Wt 213 lb 12.8 oz (96.979 kg)  BMI 31.56 kg/m2  SpO2 95% , BMI Body mass index is 31.56 kg/(m^2). GEN: Well nourished, well developed, pleasant elderly Anderson in no acute distress HEENT: normal Neck: no JVD, no masses. No carotid bruits Cardiac: RRR with grade 3/6 harsh late peaking systolic murmur with absent aortic valve closure sounds              Respiratory:  clear to auscultation bilaterally, normal work of breathing GI: soft, nontender, nondistended, + BS MS: no deformity or atrophy Ext: no pretibial edema, pedal pulses 2+= bilaterally Skin: warm and dry, no rash Neuro:  Strength and sensation are intact Psych: euthymic mood, full affect  EKG:  EKG is not ordered today.  Recent Labs: 05/16/2014: Platelets 220.0; TSH 0.89 09/05/2014: ALT 15; BUN 23; Creatinine, Ser 1.03; Potassium 4.2; Sodium 140 12/01/2014: Hemoglobin 15.1   Lipid Panel     Component Value Date/Time   CHOL 188 01/22/2014 1618   TRIG 321* 01/22/2014 1618   HDL 39* 01/22/2014 1618   CHOLHDL 4.8 01/22/2014 1618   VLDL 64* 01/22/2014 1618   LDLCALC 85 01/22/2014 1618   LDLDIRECT 134.9 05/03/2013 0951      Wt Readings from Last 3 Encounters:  02/06/15 213 lb 12.8 oz (96.979 kg)  02/04/15 213 lb (96.616 kg)  12/05/14 213 lb (96.616 kg)     Cardiac Studies Reviewed: 2D Echo 08/05/2014: Left ventricle: The cavity size was normal. Wall thickness was increased in a pattern of severe LVH. Systolic function was normal. The estimated ejection fraction was in the range of 55% to 60%. There was dynamic obstruction during Valsalvain the outflow tract, with mid-cavity obliteration, a peak velocity of 448 cm/sec, and a peak gradient of 80 mm Hg. Doppler parameters are consistent with abnormal left ventricular relaxation (grade 1  diastolic dysfunction).  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 Severely calcified leaflets. Doppler:  There was severe stenosis.   Mean gradient (S): 34 mm Hg. Peak gradient (S): 66 mm Hg.  ------------------------------------------------------------------- Aorta: The aorta was normal, not dilated, and non-diseased.  ------------------------------------------------------------------- Mitral valve:  Mildly thickened leaflets . Doppler: There was trivial regurgitation.  Peak gradient (D): 2 mm Hg.  ------------------------------------------------------------------- Left atrium: The atrium was moderately dilated.  ------------------------------------------------------------------- Atrial septum: No defect or patent foramen ovale was identified.  -------------------------------------------------------------------  Right ventricle: The cavity size was normal. Wall thickness was normal. Systolic function was normal.  ------------------------------------------------------------------- Pulmonic valve:  Structurally normal valve.  Cusp separation was normal. Doppler: Transvalvular velocity was within the normal range. There was trivial regurgitation.  ------------------------------------------------------------------- Tricuspid valve:  Structurally normal valve.  Leaflet separation was normal. Doppler: Transvalvular velocity was within the normal range. There was mild regurgitation.  ------------------------------------------------------------------- Right atrium: The atrium was normal in size.  ------------------------------------------------------------------- Pericardium: The pericardium was normal in appearance.  ------------------------------------------------------------------- Post procedure conclusions Ascending Aorta:  - The aorta was normal, not  dilated, and non-diseased.  ------------------------------------------------------------------- Measurements  Left ventricle             Value    Reference LV ID, ED, PLAX chordal        45  mm   43 - 52 LV ID, ES, PLAX chordal    (L)   22  mm   23 - 38 LV fx shortening, PLAX chordal     51  %   >=29 LV PW thickness, ED          15  mm   --------- IVS/LV PW ratio, ED      (H)   1.4     <=1.3 LV e&', lateral             4.35 cm/s  --------- LV E/e&', lateral            16.39    --------- LV e&', medial             3.7  cm/s  --------- LV E/e&', medial            19.27    --------- LV e&', average             4.03 cm/s  --------- LV E/e&', average            17.71    ---------  Ventricular septum           Value    Reference IVS thickness, ED           21  mm   ---------  LVOT                  Value    Reference LVOT ID, S               20  mm   --------- LVOT area               3.14 cm^2  ---------  Aortic valve              Value    Reference Aortic valve peak velocity, S     406  cm/s  --------- Aortic valve mean velocity, S     275  cm/s  --------- Aortic valve VTI, S          97.8 cm   --------- Aortic mean gradient, S        34  mm Hg --------- Aortic peak gradient, S        66  mm Hg ---------  Aorta                 Value    Reference Aortic root ID, ED           35  mm   ---------  Left atrium              Value    Reference LA ID,  A-P, ES             47  mm   --------- LA ID/bsa, A-P             2.12 cm/m^2 <=2.2 LA volume, S               104  ml   --------- LA volume/bsa, S            46.8 ml/m^2 --------- LA volume, ES, 1-p A4C         105  ml   --------- LA volume/bsa, ES, 1-p A4C       47.3 ml/m^2 --------- LA volume, ES, 1-p A2C         104  ml   --------- LA volume/bsa, ES, 1-p A2C       46.8 ml/m^2 ---------  Mitral valve              Value    Reference Mitral E-wave peak velocity      71.3 cm/s  --------- Mitral deceleration time    (H)   370  ms   150 - 230 Mitral peak gradient, D        2   mm Hg --------- Mitral E/A ratio, peak         0.6     ---------  Pulmonary arteries           Value    Reference PA pressure, S, DP           27  mm Hg <=30  Tricuspid valve            Value    Reference Tricuspid regurg peak velocity     247  cm/s  --------- Tricuspid peak RV-RA gradient     24  mm Hg ---------  Systemic veins             Value    Reference Estimated CVP             3   mm Hg ---------  Right ventricle            Value    Reference RV pressure, S, DP           27  mm Hg <=30 RV s&', lateral, S           12.2 cm/s  ---------  STS Risk Calculator Procedure: AV Replacement Risk of Mortality: 3.002% Morbidity or Mortality: 20.623% Long Length of Stay: 9.881% Short Length of Stay: 22.766% Permanent Stroke: 3.227% Prolonged Ventilation: 12.03% DSW Infection: 0.269% Renal Failure: 6.59% Reoperation: 8.553%  ASSESSMENT AND PLAN: 81 yo male with  NYHA III symptoms of exertional dyspnea/weakness in the setting of longstanding HCM and progressive Stage D severe symptomatic aortic stenosis. 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 He has lived with HCM for many years and I suspect his progressive symptoms are related to worsening aortic stenosis. His echo and exam findings are consistent with severe calcific aortic stenosis. I think it is clear that without treatment he will continue to have progressive symptoms of heart failure and exercise intolerance. I have reviewed the natural history of aortic stenosis with the patient and hiswife who is present today. We have discussed the limitations of medical therapy and the poor prognosis associated with symptomatic aortic stenosis. We have also reviewed potential treatment options, including  palliative medical therapy, conventional surgical aortic valve replacement, and transcatheter aortic valve replacement. We discussed treatment options in the context of this patient's specific comorbid medical conditions. If he were an operative candidate, I suspect AVR/septal myectomy would be the treatment of choice. While he is functionally independent, he recently started using a cane for gait instability and clearly has slowed significantly over recent months. I'm not sure that conventional cardiac surgery will be an option for him at age 34 with limited physical mobility. TAVR is a reasonable treatment consideration, but there are both procedural concerns as well as post-operative concerns about leaving him with subaortic stenosis/HCM. All of this was reviewed with the patient today at length. We have discussed options and will proceed with right and left heart catheterization followed by cardiac surgical evaluation. I have reviewed the risks, indications, and alternatives to cardiac catheterization with the patient. Risks include but are not limited to bleeding, infection, vascular injury, stroke, myocardial infection, arrhythmia, kidney injury, radiation-related injury in the case of prolonged fluoroscopy use, emergency cardiac surgery, and death. The patient understands the risks of  serious complication is low (<2%).   Current medicines are reviewed with the patient today.  The patient does not have concerns regarding medicines.  Labs/ tests ordered today include:  No orders of the defined types were placed in this encounter.   Disposition:   Review with multidisciplinary heart valve team and tentatively anticipate right/left heart catheterization.   Deatra James, MD  02/10/2015 10:43 PM    [address], [place], [place]  [id number] Phone: [phone]; Fax: [phone]

## 2015-02-06 ENCOUNTER — Other Ambulatory Visit: Payer: Self-pay | Admitting: *Deleted

## 2015-02-06 ENCOUNTER — Ambulatory Visit (INDEPENDENT_AMBULATORY_CARE_PROVIDER_SITE_OTHER): Payer: Medicare Other | Admitting: Cardiovascular Disease

## 2015-02-06 ENCOUNTER — Encounter: Payer: Self-pay | Admitting: Cardiovascular Disease

## 2015-02-06 VITALS — BP 106/62 | HR 69 | Ht 69.0 in | Wt 213.8 lb

## 2015-02-06 DIAGNOSIS — I350 Nonrheumatic aortic (valve) stenosis: Secondary | ICD-10-CM

## 2015-02-06 NOTE — Patient Instructions (Signed)
Medication Instructions:  Your physician recommends that you continue on your current medications as directed. Please refer to the Current Medication list given to you today.  Labwork: No new orders.   Testing/Procedures: Your physician has requested that you have a cardiac catheterization. Cardiac catheterization is used to diagnose and/or treat various heart conditions. Doctors may recommend this procedure for a number of different reasons. The most common reason is to evaluate chest pain. Chest pain can be a symptom of coronary artery disease (CAD), and cardiac catheterization can show whether plaque is narrowing or blocking your heart's arteries. This procedure is also used to evaluate the valves, as well as measure the blood flow and oxygen levels in different parts of your heart. For further information please visit [email or web address]. Please follow instruction sheet, as given.  Follow-Up: We will arrange further follow-up after cardiac catheterization.   Any Other Special Instructions Will Be Listed Below (If Applicable).

## 2015-02-10 ENCOUNTER — Encounter: Payer: Self-pay | Admitting: Cardiovascular Disease

## 2015-02-10 DIAGNOSIS — C44729 Squamous cell carcinoma of skin of left lower limb, including hip: Secondary | ICD-10-CM | POA: Diagnosis not present

## 2015-02-11 ENCOUNTER — Telehealth: Payer: Self-pay | Admitting: Cardiology

## 2015-02-11 NOTE — Telephone Encounter (Signed)
New message      Pt would like doctor to approve for handicap sticker, pt using cane.  Please call to advise

## 2015-02-11 NOTE — Telephone Encounter (Signed)
Pt calling to request handicap parking placard from Dr Meda Coffee due to cardiac issues, and being unable to walk far without assistance from his cane. Pt states he just recently started using a cane to get around.  Pt will also be getting a cath done on 02/20/15 by Dr Burt Knack.  Informed the pt that Dr Meda Coffee will not be back in the office until Thursday.  Informed the pt that I will make her aware of his request and fill the appropriate paperwork out and call him back if she approves.  Pt just requesting the paperwork be mailed to him if approved.  Informed the pt that would be no problem and will mail to confirmed address when complete.  Pt verbalized understanding and agrees with this plan.

## 2015-02-20 ENCOUNTER — Encounter (HOSPITAL_COMMUNITY): Payer: Self-pay | Admitting: Cardiovascular Disease

## 2015-02-20 ENCOUNTER — Other Ambulatory Visit: Payer: Self-pay | Admitting: *Deleted

## 2015-02-20 ENCOUNTER — Encounter (HOSPITAL_COMMUNITY)
Admission: RE | Disposition: A | Discharge status: Home / Self Care | Payer: Self-pay | Source: Ambulatory Visit | Attending: Cardiovascular Disease

## 2015-02-20 ENCOUNTER — Ambulatory Visit (HOSPITAL_COMMUNITY)
Admission: RE | Admit: 2015-02-20 | Discharge: 2015-02-20 | Disposition: A | Discharge status: Home / Self Care | Payer: Medicare Other | Source: Ambulatory Visit | Attending: Cardiovascular Disease | Admitting: Cardiovascular Disease

## 2015-02-20 DIAGNOSIS — I422 Other hypertrophic cardiomyopathy: Secondary | ICD-10-CM | POA: Insufficient documentation

## 2015-02-20 DIAGNOSIS — I350 Nonrheumatic aortic (valve) stenosis: Secondary | ICD-10-CM

## 2015-02-20 DIAGNOSIS — G4730 Sleep apnea, unspecified: Secondary | ICD-10-CM | POA: Insufficient documentation

## 2015-02-20 DIAGNOSIS — I2510 Atherosclerotic heart disease of native coronary artery without angina pectoris: Secondary | ICD-10-CM | POA: Diagnosis not present

## 2015-02-20 DIAGNOSIS — H9190 Unspecified hearing loss, unspecified ear: Secondary | ICD-10-CM | POA: Diagnosis not present

## 2015-02-20 DIAGNOSIS — Z8546 Personal history of malignant neoplasm of prostate: Secondary | ICD-10-CM | POA: Diagnosis not present

## 2015-02-20 DIAGNOSIS — H269 Unspecified cataract: Secondary | ICD-10-CM | POA: Diagnosis not present

## 2015-02-20 DIAGNOSIS — N529 Male erectile dysfunction, unspecified: Secondary | ICD-10-CM | POA: Insufficient documentation

## 2015-02-20 DIAGNOSIS — Z7902 Long term (current) use of antithrombotics/antiplatelets: Secondary | ICD-10-CM | POA: Diagnosis not present

## 2015-02-20 DIAGNOSIS — R269 Unspecified abnormalities of gait and mobility: Secondary | ICD-10-CM | POA: Diagnosis not present

## 2015-02-20 DIAGNOSIS — R55 Syncope and collapse: Secondary | ICD-10-CM | POA: Diagnosis not present

## 2015-02-20 DIAGNOSIS — M1990 Unspecified osteoarthritis, unspecified site: Secondary | ICD-10-CM | POA: Insufficient documentation

## 2015-02-20 DIAGNOSIS — I10 Essential (primary) hypertension: Secondary | ICD-10-CM | POA: Diagnosis not present

## 2015-02-20 HISTORY — PX: CARDIAC CATHETERIZATION: SHX172

## 2015-02-20 LAB — CBC
HCT: 45.6 % (ref 39.0–52.0)
HEMOGLOBIN: 15.5 g/dL (ref 13.0–17.0)
MCH: 30 pg (ref 26.0–34.0)
MCHC: 34 g/dL (ref 30.0–36.0)
MCV: 88.4 fL (ref 78.0–100.0)
Platelets: 163 10*3/uL (ref 150–400)
RBC: 5.16 MIL/uL (ref 4.22–5.81)
RDW: 13 % (ref 11.5–15.5)
WBC: 5.5 10*3/uL (ref 4.0–10.5)

## 2015-02-20 LAB — PROTIME-INR
INR: 1.05 (ref 0.00–1.49)
Prothrombin Time: 13.9 seconds (ref 11.6–15.2)

## 2015-02-20 LAB — BASIC METABOLIC PANEL
ANION GAP: 6 (ref 5–15)
BUN: 16 mg/dL (ref 6–20)
CALCIUM: 9.2 mg/dL (ref 8.9–10.3)
CHLORIDE: 105 mmol/L (ref 101–111)
CO2: 27 mmol/L (ref 22–32)
Creatinine, Ser: 0.97 mg/dL (ref 0.61–1.24)
GFR calc Af Amer: >60 mL/min (ref >60)
Glucose, Bld: 111 mg/dL — ABNORMAL HIGH (ref 65–99)
Potassium: 3.8 mmol/L (ref 3.5–5.1)
Sodium: 138 mmol/L (ref 135–145)

## 2015-02-20 SURGERY — RIGHT/LEFT HEART CATH AND CORONARY ANGIOGRAPHY
Anesthesia: LOCAL

## 2015-02-20 MED ORDER — SODIUM CHLORIDE 0.9 % WEIGHT BASED INFUSION: 1.0000 mL/kg/h | INTRAVENOUS | Status: AC

## 2015-02-20 MED ORDER — SODIUM CHLORIDE 0.9 % IV SOLN
250.0000 mL | INTRAVENOUS | Status: DC | PRN
Start: 2015-02-20 — End: 2015-02-20

## 2015-02-20 MED ORDER — SODIUM CHLORIDE 0.9 % IV SOLN
INTRAVENOUS | Status: DC
  Administered 2015-02-20: 08:00:00 via INTRAVENOUS

## 2015-02-20 MED ORDER — LIDOCAINE HCL (PF) 1 % IJ SOLN
INTRAMUSCULAR | Status: AC
  Filled 2015-02-20: qty 30

## 2015-02-20 MED ORDER — ASPIRIN 81 MG PO CHEW
81.0000 mg | CHEWABLE_TABLET | ORAL | Status: AC
  Administered 2015-02-20: 81 mg via ORAL

## 2015-02-20 MED ORDER — LIDOCAINE HCL (PF) 1 % IJ SOLN
INTRAMUSCULAR | Status: DC | PRN
  Administered 2015-02-20: 10:00:00

## 2015-02-20 MED ORDER — SODIUM CHLORIDE 0.9 % IJ SOLN
3.0000 mL | INTRAMUSCULAR | Status: DC | PRN
Start: 2015-02-20 — End: 2015-02-20

## 2015-02-20 MED ORDER — SODIUM CHLORIDE 0.9 % IV SOLN: 250.0000 mL | INTRAVENOUS | Status: DC | PRN

## 2015-02-20 MED ORDER — ACETAMINOPHEN 325 MG PO TABS: 650.0000 mg | ORAL_TABLET | ORAL | Status: DC | PRN

## 2015-02-20 MED ORDER — HEPARIN (PORCINE) IN NACL 2-0.9 UNIT/ML-% IJ SOLN
INTRAMUSCULAR | Status: AC
  Filled 2015-02-20: qty 1000

## 2015-02-20 MED ORDER — FENTANYL CITRATE (PF) 100 MCG/2ML IJ SOLN
INTRAMUSCULAR | Status: AC
  Filled 2015-02-20: qty 2

## 2015-02-20 MED ORDER — IOHEXOL 350 MG/ML SOLN
INTRAVENOUS | Status: DC | PRN
  Administered 2015-02-20: 75 mL via INTRA_ARTERIAL

## 2015-02-20 MED ORDER — SODIUM CHLORIDE 0.9 % IJ SOLN
3.0000 mL | Freq: Two times a day (BID) | INTRAMUSCULAR | Status: DC
Start: 2015-02-20 — End: 2015-02-20

## 2015-02-20 MED ORDER — ASPIRIN 81 MG PO CHEW
CHEWABLE_TABLET | ORAL | Status: AC
  Administered 2015-02-20: 81 mg via ORAL
  Filled 2015-02-20: qty 1

## 2015-02-20 MED ORDER — MIDAZOLAM HCL 2 MG/2ML IJ SOLN
INTRAMUSCULAR | Status: DC | PRN
  Administered 2015-02-20: 1 mg via INTRAVENOUS

## 2015-02-20 MED ORDER — FENTANYL CITRATE (PF) 100 MCG/2ML IJ SOLN
INTRAMUSCULAR | Status: DC | PRN
  Administered 2015-02-20: 25 ug via INTRAVENOUS

## 2015-02-20 MED ORDER — MIDAZOLAM HCL 2 MG/2ML IJ SOLN
INTRAMUSCULAR | Status: AC
  Filled 2015-02-20: qty 2

## 2015-02-20 MED ORDER — SODIUM CHLORIDE 0.9 % IJ SOLN: 3.0000 mL | Freq: Two times a day (BID) | INTRAMUSCULAR | Status: DC

## 2015-02-20 MED ORDER — SODIUM CHLORIDE 0.9 % IJ SOLN: 3.0000 mL | INTRAMUSCULAR | Status: DC | PRN

## 2015-02-20 MED ORDER — ONDANSETRON HCL 4 MG/2ML IJ SOLN: 4.0000 mg | Freq: Four times a day (QID) | INTRAMUSCULAR | Status: DC | PRN

## 2015-02-20 SURGICAL SUPPLY — 22 items
CATH BALLN WEDGE 5F 110CM (CATHETERS)
CATH INFINITI 5 FR AL2 (CATHETERS) ×2
CATH INFINITI 5 FR JL3.5 (CATHETERS)
CATH INFINITI 5FR AL1 (CATHETERS) ×2
CATH INFINITI 5FR ANG PIGTAIL (CATHETERS)
CATH INFINITI 5FR MULTPACK ANG (CATHETERS) ×2
CATH INFINITI JR4 5F (CATHETERS)
CATH SWAN GANZ 7F STRAIGHT (CATHETERS) ×2
DEVICE RAD COMP TR BAND LRG (VASCULAR PRODUCTS) ×2
GLIDESHEATH SLEND SS 6F .021 (SHEATH) ×2
KIT HEART LEFT (KITS) ×2
KIT HEART RIGHT NAMIC (KITS) ×2
PACK CARDIAC CATHETERIZATION (CUSTOM PROCEDURE TRAY) ×2
SHEATH FAST CATH BRACH 5F 5CM (SHEATH)
SHEATH PINNACLE 5F 10CM (SHEATH) ×2
SHEATH PINNACLE 7F 10CM (SHEATH) ×2
SYR MEDRAD MARK V 150ML (SYRINGE) ×2
TRANSDUCER W/STOPCOCK (MISCELLANEOUS) ×2
TUBING CIL FLEX 10 FLL-RA (TUBING) ×2
WIRE EMERALD 3MM-J .035X150CM (WIRE) ×2
WIRE EMERALD ST .035X260CM (WIRE) ×2
WIRE SAFE-T 1.5MM-J .035X260CM (WIRE)

## 2015-02-20 NOTE — H&P (View-Only) (Signed)
Cardiology Office Note Date:  02/10/2015   ID:  Robert Anderson, DOB [birth year], MRN [id number]  PCP:  Joycelyn Man, MD  Cardiologist:  Sherren Mocha, MD    Chief Complaint  Patient presents with  . Shortness of Breath     History of Present Illness: Robert Anderson is a 81 y.o. male who presents for evaluation of severe aortic stenosis.   The patient has a longstanding history of hypertrophic cardiomyopathy. He was diagnosed over 20 years ago and was told to avoid exercise in the hot weather and to avoid strenuous physical exertion. He's done well over the years and did not see a cardiologist until he was evaluated by Dr Meda Coffee last year after a TIA. He presented with an episode of expressive aphasia and he had complete symptom resolution. He was also noted to have moderate to severe aortic stenosis when he had an echo at that time.  He has been physically active over the years - played tennis his entire life. He still goes to the [employer] 3x/week. He exercises on the elliptical and gets his heart rate up into the 80's but not above that. He doesn't do yard work any longer. States that he 'tires easily.' He's had a few episodes of near-syncope over the years but no problems in the past year. He denies any symptoms of angina. He does admit to exertional dyspnea, progressive over the past year. He is tired and short of breath with taking out the garbage can or doing a modest amount of physical work. He recently moved some furniture and had to lie down and rest afterwards because of weakness. He just bought a cane yesterday to help with instability when he walks. He remains independent and still drives a car.    Past Medical History  Diagnosis Date  . HYPERTENSION 05/24/2007    Qualifier: Diagnosis of  By: Scherrie Gerlach    . DEGENERATIVE JOINT DISEASE, SPINE 05/24/2007    Qualifier: Diagnosis of  By: Scherrie Gerlach    . ERECTILE DYSFUNCTION 05/24/2007    Qualifier: Diagnosis of   By: Scherrie Gerlach    . HEARING LOSS 05/24/2007    Qualifier: Diagnosis of  By: Scherrie Gerlach    . Aphasia 01/21/2014    5 min spell without assoc sx    . CATARACTS, BILATERAL 05/24/2007    Qualifier: Diagnosis of  By: Scherrie Gerlach    . Heart murmur 01/21/2014    undefined apparently present for years    . History of prostate cancer 10/08/2010  . Spell of change in speech 01/21/2014    see above no slurring just couldnt get words out.   . Sleep apnea   . OA (osteoarthritis)   . Cancer     Past Surgical History  Procedure Laterality Date  . Prostate surgery    . Joint replacement    . Hemiarthroplasty shoulder fracture    . Cataract extraction      right 2004, left 2010    Current Outpatient Prescriptions  Medication Sig Dispense Refill  . clopidogrel (PLAVIX) 75 MG tablet Take 75 mg by mouth every morning.    . docusate sodium (COLACE) 100 MG capsule Take 200 mg by mouth every morning.     . metoprolol tartrate (LOPRESSOR) 25 MG tablet Take 12.5 mg by mouth 3 (three) times a week. Monday , Wednesday , Friday    . pravastatin (PRAVACHOL) 10 MG tablet Take 10 mg by  mouth at bedtime.    . tamsulosin (FLOMAX) 0.4 MG CAPS capsule Take 0.4 mg by mouth 3 (three) times a week. Monday Wednesday Fridday     No current facility-administered medications for this visit.    Allergies:   Review of patient's allergies indicates no known allergies.   Social History:  The patient  reports that he has never smoked. He does not have any smokeless tobacco history on file. He reports that he does not drink alcohol or use illicit drugs.   Family History:  The patient's  family history includes CVA in his father and mother; Cancer in his father and sister; Heart disease in his brother and mother; Lung cancer in his brother; Prostate cancer in his brother.    ROS:  Please see the history of present illness.  Otherwise, review of systems is positive for neck pain, DOE, easy bruising, excessive  fatigue, and balance problems. Also has hip problems from arthritis.  All other systems are reviewed and negative.    PHYSICAL EXAM: VS:  BP 106/62 mmHg  Pulse 69  Ht 5\' 9"  (1.753 m)  Wt 213 lb 12.8 oz (96.979 kg)  BMI 31.56 kg/m2  SpO2 95% , BMI Body mass index is 31.56 kg/(m^2). GEN: Well nourished, well developed, pleasant elderly man in no acute distress HEENT: normal Neck: no JVD, no masses. No carotid bruits Cardiac: RRR with grade 3/6 harsh late peaking systolic murmur with absent aortic valve closure sounds              Respiratory:  clear to auscultation bilaterally, normal work of breathing GI: soft, nontender, nondistended, + BS MS: no deformity or atrophy Ext: no pretibial edema, pedal pulses 2+= bilaterally Skin: warm and dry, no rash Neuro:  Strength and sensation are intact Psych: euthymic mood, full affect  EKG:  EKG is not ordered today.  Recent Labs: 05/16/2014: Platelets 220.0; TSH 0.89 09/05/2014: ALT 15; BUN 23; Creatinine, Ser 1.03; Potassium 4.2; Sodium 140 12/01/2014: Hemoglobin 15.1   Lipid Panel     Component Value Date/Time   CHOL 188 01/22/2014 1618   TRIG 321* 01/22/2014 1618   HDL 39* 01/22/2014 1618   CHOLHDL 4.8 01/22/2014 1618   VLDL 64* 01/22/2014 1618   LDLCALC 85 01/22/2014 1618   LDLDIRECT 134.9 05/03/2013 0951      Wt Readings from Last 3 Encounters:  02/06/15 213 lb 12.8 oz (96.979 kg)  02/04/15 213 lb (96.616 kg)  12/05/14 213 lb (96.616 kg)     Cardiac Studies Reviewed: 2D Echo 08/05/2014: Left ventricle: The cavity size was normal. Wall thickness was increased in a pattern of severe LVH. Systolic function was normal. The estimated ejection fraction was in the range of 55% to 60%. There was dynamic obstruction during Valsalvain the outflow tract, with mid-cavity obliteration, a peak velocity of 448 cm/sec, and a peak gradient of 80 mm Hg. Doppler parameters are consistent with abnormal left ventricular relaxation (grade 1  diastolic dysfunction).  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 Severely calcified leaflets. Doppler:  There was severe stenosis.   Mean gradient (S): 34 mm Hg. Peak gradient (S): 66 mm Hg.  ------------------------------------------------------------------- Aorta: The aorta was normal, not dilated, and non-diseased.  ------------------------------------------------------------------- Mitral valve:  Mildly thickened leaflets . Doppler: There was trivial regurgitation.  Peak gradient (D): 2 mm Hg.  ------------------------------------------------------------------- Left atrium: The atrium was moderately dilated.  ------------------------------------------------------------------- Atrial septum: No defect or patent foramen ovale was identified.  -------------------------------------------------------------------  Right ventricle: The cavity size was normal. Wall thickness was normal. Systolic function was normal.  ------------------------------------------------------------------- Pulmonic valve:  Structurally normal valve.  Cusp separation was normal. Doppler: Transvalvular velocity was within the normal range. There was trivial regurgitation.  ------------------------------------------------------------------- Tricuspid valve:  Structurally normal valve.  Leaflet separation was normal. Doppler: Transvalvular velocity was within the normal range. There was mild regurgitation.  ------------------------------------------------------------------- Right atrium: The atrium was normal in size.  ------------------------------------------------------------------- Pericardium: The pericardium was normal in appearance.  ------------------------------------------------------------------- Post procedure conclusions Ascending Aorta:  - The aorta was normal, not  dilated, and non-diseased.  ------------------------------------------------------------------- Measurements  Left ventricle             Value    Reference LV ID, ED, PLAX chordal        45  mm   43 - 52 LV ID, ES, PLAX chordal    (L)   22  mm   23 - 38 LV fx shortening, PLAX chordal     51  %   >=29 LV PW thickness, ED          15  mm   --------- IVS/LV PW ratio, ED      (H)   1.4     <=1.3 LV e&', lateral             4.35 cm/s  --------- LV E/e&', lateral            16.39    --------- LV e&', medial             3.7  cm/s  --------- LV E/e&', medial            19.27    --------- LV e&', average             4.03 cm/s  --------- LV E/e&', average            17.71    ---------  Ventricular septum           Value    Reference IVS thickness, ED           21  mm   ---------  LVOT                  Value    Reference LVOT ID, S               20  mm   --------- LVOT area               3.14 cm^2  ---------  Aortic valve              Value    Reference Aortic valve peak velocity, S     406  cm/s  --------- Aortic valve mean velocity, S     275  cm/s  --------- Aortic valve VTI, S          97.8 cm   --------- Aortic mean gradient, S        34  mm Hg --------- Aortic peak gradient, S        66  mm Hg ---------  Aorta                 Value    Reference Aortic root ID, ED           35  mm   ---------  Left atrium              Value    Reference LA ID,  A-P, ES             47  mm   --------- LA ID/bsa, A-P             2.12 cm/m^2 <=2.2 LA volume, S               104  ml   --------- LA volume/bsa, S            46.8 ml/m^2 --------- LA volume, ES, 1-p A4C         105  ml   --------- LA volume/bsa, ES, 1-p A4C       47.3 ml/m^2 --------- LA volume, ES, 1-p A2C         104  ml   --------- LA volume/bsa, ES, 1-p A2C       46.8 ml/m^2 ---------  Mitral valve              Value    Reference Mitral E-wave peak velocity      71.3 cm/s  --------- Mitral deceleration time    (H)   370  ms   150 - 230 Mitral peak gradient, D        2   mm Hg --------- Mitral E/A ratio, peak         0.6     ---------  Pulmonary arteries           Value    Reference PA pressure, S, DP           27  mm Hg <=30  Tricuspid valve            Value    Reference Tricuspid regurg peak velocity     247  cm/s  --------- Tricuspid peak RV-RA gradient     24  mm Hg ---------  Systemic veins             Value    Reference Estimated CVP             3   mm Hg ---------  Right ventricle            Value    Reference RV pressure, S, DP           27  mm Hg <=30 RV s&', lateral, S           12.2 cm/s  ---------  STS Risk Calculator Procedure: AV Replacement Risk of Mortality: 3.002% Morbidity or Mortality: 20.623% Long Length of Stay: 9.881% Short Length of Stay: 22.766% Permanent Stroke: 3.227% Prolonged Ventilation: 12.03% DSW Infection: 0.269% Renal Failure: 6.59% Reoperation: 8.553%  ASSESSMENT AND PLAN: 81 yo male with  NYHA III symptoms of exertional dyspnea/weakness in the setting of longstanding HCM and progressive Stage D severe symptomatic aortic stenosis. 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 He has lived with HCM for many years and I suspect his progressive symptoms are related to worsening aortic stenosis. His echo and exam findings are consistent with severe calcific aortic stenosis. I think it is clear that without treatment he will continue to have progressive symptoms of heart failure and exercise intolerance. I have reviewed the natural history of aortic stenosis with the patient and hiswife who is present today. We have discussed the limitations of medical therapy and the poor prognosis associated with symptomatic aortic stenosis. We have also reviewed potential treatment options, including  palliative medical therapy, conventional surgical aortic valve replacement, and transcatheter aortic valve replacement. We discussed treatment options in the context of this patient's specific comorbid medical conditions. If he were an operative candidate, I suspect AVR/septal myectomy would be the treatment of choice. While he is functionally independent, he recently started using a cane for gait instability and clearly has slowed significantly over recent months. I'm not sure that conventional cardiac surgery will be an option for him at age 78 with limited physical mobility. TAVR is a reasonable treatment consideration, but there are both procedural concerns as well as post-operative concerns about leaving him with subaortic stenosis/HCM. All of this was reviewed with the patient today at length. We have discussed options and will proceed with right and left heart catheterization followed by cardiac surgical evaluation. I have reviewed the risks, indications, and alternatives to cardiac catheterization with the patient. Risks include but are not limited to bleeding, infection, vascular injury, stroke, myocardial infection, arrhythmia, kidney injury, radiation-related injury in the case of prolonged fluoroscopy use, emergency cardiac surgery, and death. The patient understands the risks of  serious complication is low (<2%).   Current medicines are reviewed with the patient today.  The patient does not have concerns regarding medicines.  Labs/ tests ordered today include:  No orders of the defined types were placed in this encounter.   Disposition:   Review with multidisciplinary heart valve team and tentatively anticipate right/left heart catheterization.   Deatra James, MD  02/10/2015 10:43 PM    [address], [place], [place]  [address] Phone: [phone]; Fax: [phone]

## 2015-02-20 NOTE — Interval H&P Note (Signed)
History and Physical Interval Note:  02/20/2015 9:39 AM  Robert Anderson  has presented today for surgery, with the diagnosis of aortic stenosis  The various methods of treatment have been discussed with the patient and family. After consideration of risks, benefits and other options for treatment, the patient has consented to  Procedure(s): Right/Left Heart Cath and Coronary Angiography (N/A) as a surgical intervention .  The patient's history has been reviewed, patient examined, no change in status, stable for surgery.  I have reviewed the patient's chart and labs.  Questions were answered to the patient's satisfaction.     Sherren Mocha

## 2015-02-20 NOTE — Discharge Instructions (Signed)

## 2015-02-20 NOTE — Progress Notes (Signed)
Site area: RFA/RFV Site Prior to Removal:  Level 0 Pressure Applied For:68min Manual:   yes Patient Status During Pull:stable   Post Pull Site:  Level 0 Post Pull Instructions Given:  yes Post Pull Pulses Present: palpable Dressing Applied:  clear Bedrest begins @ 1105 Comments:

## 2015-02-21 LAB — POCT I-STAT 3, ART BLOOD GAS (G3+)
Acid-base deficit: 1 mmol/L (ref 0.0–2.0)
Bicarbonate: 24.4 mEq/L — ABNORMAL HIGH (ref 20.0–24.0)
O2 Saturation: 94 %
PCO2 ART: 44.3 mmHg (ref 35.0–45.0)
TCO2: 26 mmol/L (ref 0–100)
pH, Arterial: 7.35 (ref 7.350–7.450)
pO2, Arterial: 77 mmHg — ABNORMAL LOW (ref 80.0–100.0)

## 2015-02-21 LAB — POCT I-STAT 3, VENOUS BLOOD GAS (G3P V)
ACID-BASE DEFICIT: 11 mmol/L — AB (ref 0.0–2.0)
Bicarbonate: 16.6 mEq/L — ABNORMAL LOW (ref 20.0–24.0)
O2 Saturation: 64 %
PCO2 VEN: 41 mmHg — AB (ref 45.0–50.0)
TCO2: 18 mmol/L (ref 0–100)
pH, Ven: 7.215 — ABNORMAL LOW (ref 7.250–7.300)
pO2, Ven: 40 mmHg (ref 30.0–45.0)

## 2015-02-27 ENCOUNTER — Ambulatory Visit (HOSPITAL_COMMUNITY)
Admission: RE | Admit: 2015-02-27 | Discharge: 2015-02-27 | Disposition: A | Discharge status: Home / Self Care | Payer: Medicare Other | Source: Ambulatory Visit | Attending: Cardiovascular Disease | Admitting: Cardiovascular Disease

## 2015-02-27 DIAGNOSIS — I2510 Atherosclerotic heart disease of native coronary artery without angina pectoris: Secondary | ICD-10-CM | POA: Insufficient documentation

## 2015-02-27 DIAGNOSIS — I350 Nonrheumatic aortic (valve) stenosis: Secondary | ICD-10-CM

## 2015-02-27 DIAGNOSIS — N289 Disorder of kidney and ureter, unspecified: Secondary | ICD-10-CM | POA: Diagnosis not present

## 2015-02-27 DIAGNOSIS — I700 Atherosclerosis of aorta: Secondary | ICD-10-CM | POA: Diagnosis not present

## 2015-02-27 DIAGNOSIS — N2889 Other specified disorders of kidney and ureter: Secondary | ICD-10-CM | POA: Diagnosis not present

## 2015-02-27 DIAGNOSIS — Z01818 Encounter for other preprocedural examination: Secondary | ICD-10-CM | POA: Insufficient documentation

## 2015-02-27 DIAGNOSIS — I517 Cardiomegaly: Secondary | ICD-10-CM | POA: Diagnosis not present

## 2015-02-27 DIAGNOSIS — K5730 Diverticulosis of large intestine without perforation or abscess without bleeding: Secondary | ICD-10-CM | POA: Diagnosis not present

## 2015-02-27 DIAGNOSIS — Z01810 Encounter for preprocedural cardiovascular examination: Secondary | ICD-10-CM | POA: Diagnosis not present

## 2015-02-27 LAB — PULMONARY FUNCTION TEST
DL/VA % pred: 99 %
DL/VA: 4.49 ml/min/mmHg/L
DLCO unc % pred: 92 %
DLCO unc: 28.52 ml/min/mmHg
FEF 25-75 POST: 3.24 L/s
FEF 25-75 PRE: 2.21 L/s
FEF2575-%Change-Post: 46 %
FEF2575-%PRED-POST: 223 %
FEF2575-%Pred-Pre: 152 %
FEV1-%CHANGE-POST: 8 %
FEV1-%PRED-POST: 118 %
FEV1-%Pred-Pre: 109 %
FEV1-Post: 2.82 L
FEV1-Pre: 2.59 L
FEV1FVC-%Change-Post: 5 %
FEV1FVC-%PRED-PRE: 110 %
FEV6-%Change-Post: 2 %
FEV6-%PRED-POST: 107 %
FEV6-%PRED-PRE: 104 %
FEV6-POST: 3.45 L
FEV6-Pre: 3.35 L
FEV6FVC-%PRED-POST: 108 %
FEV6FVC-%Pred-Pre: 108 %
FVC-%CHANGE-POST: 2 %
FVC-%Pred-Post: 99 %
FVC-%Pred-Pre: 96 %
FVC-POST: 3.45 L
FVC-PRE: 3.35 L
POST FEV6/FVC RATIO: 100 %
Post FEV1/FVC ratio: 82 %
Pre FEV1/FVC ratio: 77 %
Pre FEV6/FVC Ratio: 100 %
RV % pred: 148 %
RV: 4.12 L
TLC % pred: 111 %
TLC: 7.66 L

## 2015-02-27 MED ORDER — IOHEXOL 350 MG/ML SOLN
80.0000 mL | Freq: Once | INTRAVENOUS | Status: AC | PRN
  Administered 2015-02-27: 100 mL via INTRAVENOUS

## 2015-02-27 MED ORDER — IOHEXOL 350 MG/ML SOLN
75.0000 mL | Freq: Once | INTRAVENOUS | Status: AC | PRN
  Administered 2015-02-27: 100 mL via INTRAVENOUS

## 2015-02-27 MED ORDER — ALBUTEROL SULFATE (2.5 MG/3ML) 0.083% IN NEBU
2.5000 mg | INHALATION_SOLUTION | Freq: Once | RESPIRATORY_TRACT | Status: AC
  Administered 2015-02-27: 2.5 mg via RESPIRATORY_TRACT

## 2015-03-03 ENCOUNTER — Encounter: Payer: Self-pay | Admitting: Cardiovascular Disease

## 2015-03-04 ENCOUNTER — Institutional Professional Consult (permissible substitution) (INDEPENDENT_AMBULATORY_CARE_PROVIDER_SITE_OTHER): Payer: Medicare Other | Admitting: Surgery

## 2015-03-04 ENCOUNTER — Encounter: Payer: Self-pay | Admitting: Surgery

## 2015-03-04 VITALS — BP 105/63 | HR 86 | Resp 20 | Ht 69.0 in | Wt 210.0 lb

## 2015-03-04 DIAGNOSIS — I350 Nonrheumatic aortic (valve) stenosis: Secondary | ICD-10-CM | POA: Diagnosis not present

## 2015-03-05 ENCOUNTER — Ambulatory Visit: Payer: Medicare Other | Attending: Surgery | Admitting: Physical Therapy

## 2015-03-05 ENCOUNTER — Encounter: Payer: Self-pay | Admitting: Surgery

## 2015-03-05 ENCOUNTER — Institutional Professional Consult (permissible substitution) (INDEPENDENT_AMBULATORY_CARE_PROVIDER_SITE_OTHER): Payer: Medicare Other | Admitting: Thoracic Surgery (Cardiothoracic Vascular Surgery)

## 2015-03-05 ENCOUNTER — Encounter: Payer: Self-pay | Admitting: Thoracic Surgery (Cardiothoracic Vascular Surgery)

## 2015-03-05 VITALS — BP 124/70 | HR 54 | Resp 18 | Ht 69.0 in | Wt 210.0 lb

## 2015-03-05 DIAGNOSIS — I421 Obstructive hypertrophic cardiomyopathy: Secondary | ICD-10-CM | POA: Diagnosis not present

## 2015-03-05 DIAGNOSIS — R262 Difficulty in walking, not elsewhere classified: Secondary | ICD-10-CM | POA: Diagnosis not present

## 2015-03-05 DIAGNOSIS — I350 Nonrheumatic aortic (valve) stenosis: Secondary | ICD-10-CM | POA: Insufficient documentation

## 2015-03-05 NOTE — Progress Notes (Addendum)
[facility]    CARDIOTHORACIC SURGERY CONSULTATION REPORT  Referring Provider is Dorothy Spark, MD PCP is Joycelyn Man, MD  Chief Complaint  Patient presents with  . Aortic Stenosis    SEVERE...2nd TAVR CONSULT    HPI:  Patient is an 81 year old gentleman with long-standing history of aortic stenosis, hypertension and hypertrophic obstructive cardiomyopathy who has been referred for a second surgical opinion to discuss treatment options for management of severe symptomatic aortic stenosis. The patient was noted to have a heart murmur and diagnosed with hypertrophic obstructive cardiomyopathy with an 20 years ago. He was told to avoid exercising in hot weather and to make certain never to allow him self to become dehydrated. He did quite well and remain physically active until a proximally 1 year ago when he suffered a brief TIA. Symptoms at the time were manifest as a 5 minute episode of aphasia which resolved. An echocardiogram was performed demonstrating the presence of moderate to severe aortic stenosis with normal left ventricular systolic function. He was referred to Dr. Meda Coffee for formal cardiology evaluation. Repeat echocardiogram performed 08/05/2014 confirmed the presence of severe aortic stenosis with peak velocity across the valve measuring 4 m/s corresponding to mean transvalvular gradient of 34 mmHg.  The patient also was noted to have dynamic left obstruction during Valsalva maneuver with peak velocity increasing to 4.5 m/s. There was severe left ventricular hypertrophy.  The patient was referred to Dr. Burt Knack and underwent diagnostic cardiac catheterization on 02/20/2015. He was found to have mild nonobstructive coronary artery disease with normal right heart hemodynamics. Attempts to cross the aortic valve to directly measure transvalvular and left ventricular outflow tract gradients were unsuccessful.  CT angiography  has been performed to evaluate the potential feasibility of transcatheter aortic valve replacement.  The patient was referred for surgical consultation and has been seen previously by Dr. Cyndia Bent who discussed treatment options including both conventional surgical aortic valve replacement with or without septal myomectomy and transcatheter aortic valve replacement. The patient has been referred for a second surgical consultation.  The patient is married and lives locally in [place] with his wife. He has been retired for 24 years having previously worked in [employer]. He has been physically active all of his life. He played tennis up until several years ago when he had to undergo shoulder replacement surgery. He still goes to the [facility] 3 times a week and exercises on a regular basis. Both he and his wife note that over the past several months to a year the patient has experienced progressive exertional fatigue and exertional shortness of breath. He now gets short of breath pushing the trashcan to the curb. He does not get short of breath with ordinary activities around the house and he has never had any symptoms of resting shortness of breath, chest discomfort, PND, orthopnea, or lower extremity edema. He states that he has had a few episodes of severe dizziness and near syncope over the years, but none recently.  He states that recently his mobility has decreased somewhat because of pain in both hips related to chronic arthritis and slightly unstable balance. He recently purchased a cane to assist with walking.    Past Medical History  Diagnosis Date  . HYPERTENSION 05/24/2007    Qualifier: Diagnosis of  By: Scherrie Gerlach    . DEGENERATIVE JOINT DISEASE, SPINE 05/24/2007    Qualifier: Diagnosis of  By: Scherrie Gerlach    .  ERECTILE DYSFUNCTION 05/24/2007    Qualifier: Diagnosis of  By: Scherrie Gerlach    . HEARING LOSS 05/24/2007    Qualifier: Diagnosis of  By: Scherrie Gerlach    . TIA  (transient ischemic attack) 01/21/2014    5 min spell aphasia without assoc sx    . CATARACTS, BILATERAL 05/24/2007    Qualifier: Diagnosis of  By: Scherrie Gerlach    . Heart murmur 01/21/2014    undefined apparently present for years    . History of prostate cancer 10/08/2010  . Aphasia 01/21/2014    see above no slurring just couldnt get words out.   . Sleep apnea   . OA (osteoarthritis)     both hips  . Squamous cell skin cancer     left lower leg  . Severe aortic stenosis 02/04/2015  . Hypertrophic obstructive cardiomyopathy 02/01/2014  . CVA (cerebral infarction) 02/19/2014    Small infarct high posterior left frontal lobe on MRI     Past Surgical History  Procedure Laterality Date  . Prostate surgery    . Joint replacement    . Hemiarthroplasty shoulder fracture    . Cataract extraction      right 2004, left 2010  . Cardiac catheterization N/A 02/20/2015    Procedure: Right/Left Heart Cath and Coronary Angiography;  Surgeon: Sherren Mocha, MD;  Location: [facility];  Service: Cardiovascular;  Laterality: N/A;    Family History  Problem Relation Age of Onset  . Heart disease Mother   . Cancer Father   . Cancer Sister   . Prostate cancer Brother   . Heart disease Brother   . Lung cancer Brother   . CVA Mother   . CVA Father     History   Social History  . Marital Status: Married    Spouse Name: N/A  . Number of Children: N/A  . Years of Education: N/A   Occupational History  . Not on file.   Social History Main Topics  . Smoking status: Never Smoker   . Smokeless tobacco: Not on file  . Alcohol Use: No  . Drug Use: No  . Sexual Activity: No   Other Topics Concern  . Not on file   Social History Narrative    Current Outpatient Prescriptions  Medication Sig Dispense Refill  . clopidogrel (PLAVIX) 75 MG tablet Take 75 mg by mouth every morning.    . docusate sodium (COLACE) 100 MG capsule Take 200 mg by mouth every morning.     . metoprolol tartrate  (LOPRESSOR) 25 MG tablet Take 12.5 mg by mouth 3 (three) times a week. Monday , Wednesday , Friday    . pravastatin (PRAVACHOL) 10 MG tablet Take 10 mg by mouth at bedtime.    . tamsulosin (FLOMAX) 0.4 MG CAPS capsule Take 0.4 mg by mouth 3 (three) times a week. Monday Wednesday Fridday     No current facility-administered medications for this visit.    No Known Allergies    Review of Systems:   General:  normal appetite, decreased energy, no weight gain, no weight loss, no fever  Cardiac:  no chest pain with exertion, no chest pain at rest, +SOB with exertion, no resting SOB, no PND, no orthopnea, no palpitations, no arrhythmia, no atrial fibrillation, no LE edema, + dizzy spells, no syncope  Respiratory:  + exertional shortness of breath, no home oxygen, no productive cough, no dry cough, no bronchitis, no wheezing, no hemoptysis, no asthma,  no pain with inspiration or cough, no sleep apnea, no CPAP at night  GI:   no difficulty swallowing, no reflux, no frequent heartburn, no hiatal hernia, no abdominal pain, no constipation, no diarrhea, no hematochezia, no hematemesis, no melena  GU:   no dysuria,  + frequency, no urinary tract infection, no hematuria, no enlarged prostate, no kidney stones, no kidney disease  Vascular:  no pain suggestive of claudication, no pain in feet, no leg cramps, no varicose veins, no DVT, no non-healing foot ulcer  Neuro:   + stroke, + TIA's, no seizures, no headaches, no temporary blindness one eye,  no slurred speech, no peripheral neuropathy, no chronic pain, mild instability of gait, no memory/cognitive dysfunction  Musculoskeletal: + arthritis, no joint swelling, no myalgias, mild difficulty walking, mildly reduced mobility   Skin:   no rash, no itching, no skin infections, no pressure sores or ulcerations  Psych:   no anxiety, no depression, no nervousness, no unusual recent stress  Eyes:   no blurry vision, no floaters, no recent vision changes, + wears  glasses or contacts  ENT:   + hearing loss, no loose or painful teeth, no dentures, last saw dentist June 2016  Hematologic:  + easy bruising, no abnormal bleeding, no clotting disorder, no frequent epistaxis  Endocrine:  no diabetes, does not check CBG's at home           Physical Exam:   BP 124/70 mmHg  Pulse 54  Resp 18  Ht 5\' 9"  (1.753 m)  Wt 210 lb (95.255 kg)  BMI 31.00 kg/m2  SpO2 98%  General:    well-appearing - appears younger than stated age  2:  Unremarkable   Neck:   no JVD, no bruits, no adenopathy   Chest:   clear to auscultation, symmetrical breath sounds, no wheezes, no rhonchi   CV:   RRR, grade III/VI crescendo/decrescendo murmur heard best at ,  no diastolic murmur  Abdomen:  soft, non-tender, no masses   Extremities:  warm, well-perfused, pulses palpable, no LE edema  Rectal/GU  Deferred  Neuro:   Grossly non-focal and symmetrical throughout  Skin:   Clean and dry, no rashes, no breakdown   Diagnostic Tests:  Transthoracic Echocardiography  Patient:  Callan, Norden MR #:    [id number] Study Date: 08/05/2014 Gender:   M Age:    69 Height:   177.8 cm Weight:   97.5 kg BSA:    2.22 m^2 Pt. Status: Room:  ATTENDING  Jenkins Rouge, M.D. SONOGRAPHER Victorio Palm, RDCS ORDERING   Ena Dawley, M.D. REFERRING  Ena Dawley, M.D. PERFORMING  [facility], Outpatient  cc:  ------------------------------------------------------------------- LV EF: 55% -  60%  ------------------------------------------------------------------- Indications:   HOCM (I42.1). Aortic stenosis. (I35.0).  ------------------------------------------------------------------- History:  PMH: Acquired from the patient and from the patient&'s chart. 5/6 Holosystolic murmur. Hypertrophic cardiomyopathy, with an ejection fraction of 70%by echocardiography. Outflow obstruction occurs with provocation. Borderline significant aortic  stenosis. Risk factors: Hypertension.  ------------------------------------------------------------------- Study Conclusions  - Left ventricle: The cavity size was normal. Wall thickness was increased in a pattern of severe LVH. Systolic function was normal. The estimated ejection fraction was in the range of 55% to 60%. There was dynamic obstruction during Valsalvain the outflow tract, with mid-cavity obliteration, a peak velocity of 448 cm/sec, and a peak gradient of 80 mm Hg. Doppler parameters are consistent with abnormal left ventricular relaxation (grade 1 diastolic dysfunction). - Aortic valve: Likely trileaflet with fused right and left cusps.  There was severe stenosis. - Left atrium: The atrium was moderately dilated. - Atrial septum: No defect or patent foramen ovale was identified. - Impressions: No LVOT gradient or SAM appreciated Primary issues appears to be fixed valvular aortic stenosis  Impressions:  - No LVOT gradient or SAM appreciated Primary issues appears to be fixed valvular aortic stenosis  ------------------------------------------------------------------- Labs, prior tests, procedures, and surgery: Echocardiography (June 2015).  The study demonstrated systemic outflow obstruction. EF was 70%. Severe concentric LVH. LVOT obstruction increase from 42mmHG to 150 mmHG with Valsalva.  Transthoracic echocardiography. M-mode, complete 2D, spectral Doppler, and color Doppler. Birthdate: Patient birthdate: [birth year]. Age: Patient is 81 yr old. Sex: Gender: male. BMI: 30.8 kg/m^2. Blood pressure:   130/75 Patient status: Outpatient. Study date: Study date: 08/05/2014. Study time: 02:02 PM. Location: [facility]  -------------------------------------------------------------------  ------------------------------------------------------------------- Left ventricle: The cavity size was normal. Wall thickness  was increased in a pattern of severe LVH. Systolic function was normal. The estimated ejection fraction was in the range of 55% to 60%. There was dynamic obstruction during Valsalvain the outflow tract, with mid-cavity obliteration, a peak velocity of 448 cm/sec, and a peak gradient of 80 mm Hg. Doppler parameters are consistent with abnormal left ventricular relaxation (grade 1 diastolic dysfunction).  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 Severely calcified leaflets. Doppler:  There was severe stenosis.   Mean gradient (S): 34 mm Hg. Peak gradient (S): 66 mm Hg.  ------------------------------------------------------------------- Aorta: The aorta was normal, not dilated, and non-diseased.  ------------------------------------------------------------------- Mitral valve:  Mildly thickened leaflets . Doppler: There was trivial regurgitation.  Peak gradient (D): 2 mm Hg.  ------------------------------------------------------------------- Left atrium: The atrium was moderately dilated.  ------------------------------------------------------------------- Atrial septum: No defect or patent foramen ovale was identified.  ------------------------------------------------------------------- Right ventricle: The cavity size was normal. Wall thickness was normal. Systolic function was normal.  ------------------------------------------------------------------- Pulmonic valve:  Structurally normal valve.  Cusp separation was normal. Doppler: Transvalvular velocity was within the normal range. There was trivial regurgitation.  ------------------------------------------------------------------- Tricuspid valve:  Structurally normal valve.  Leaflet separation was normal. Doppler: Transvalvular velocity was within the normal range. There was mild  regurgitation.  ------------------------------------------------------------------- Right atrium: The atrium was normal in size.  ------------------------------------------------------------------- Pericardium: The pericardium was normal in appearance.  ------------------------------------------------------------------- Post procedure conclusions Ascending Aorta:  - The aorta was normal, not dilated, and non-diseased.  ------------------------------------------------------------------- Measurements  Left ventricle             Value    Reference LV ID, ED, PLAX chordal        45  mm   43 - 52 LV ID, ES, PLAX chordal    (L)   22  mm   23 - 38 LV fx shortening, PLAX chordal     51  %   >=29 LV PW thickness, ED          15  mm   --------- IVS/LV PW ratio, ED      (H)   1.4     <=1.3 LV e&', lateral             4.35 cm/s  --------- LV E/e&', lateral            16.39    --------- LV e&', medial             3.7  cm/s  --------- LV E/e&', medial            19.27    ---------  LV e&', average             4.03 cm/s  --------- LV E/e&', average            17.71    ---------  Ventricular septum           Value    Reference IVS thickness, ED           21  mm   ---------  LVOT                  Value    Reference LVOT ID, S               20  mm   --------- LVOT area               3.14 cm^2  ---------  Aortic valve              Value    Reference Aortic valve peak velocity, S     406  cm/s  --------- Aortic valve mean velocity, S     275  cm/s  --------- Aortic valve VTI, S          97.8 cm   --------- Aortic mean gradient, S        34  mm Hg --------- Aortic peak  gradient, S        66  mm Hg ---------  Aorta                 Value    Reference Aortic root ID, ED           35  mm   ---------  Left atrium              Value    Reference LA ID, A-P, ES             47  mm   --------- LA ID/bsa, A-P             2.12 cm/m^2 <=2.2 LA volume, S              104  ml   --------- LA volume/bsa, S            46.8 ml/m^2 --------- LA volume, ES, 1-p A4C         105  ml   --------- LA volume/bsa, ES, 1-p A4C       47.3 ml/m^2 --------- LA volume, ES, 1-p A2C         104  ml   --------- LA volume/bsa, ES, 1-p A2C       46.8 ml/m^2 ---------  Mitral valve              Value    Reference Mitral E-wave peak velocity      71.3 cm/s  --------- Mitral deceleration time    (H)   370  ms   150 - 230 Mitral peak gradient, D        2   mm Hg --------- Mitral E/A ratio, peak         0.6     ---------  Pulmonary arteries           Value    Reference PA pressure, S, DP           27  mm Hg <=30  Tricuspid valve            Value    Reference Tricuspid regurg peak velocity  247  cm/s  --------- Tricuspid peak RV-RA gradient     24  mm Hg ---------  Systemic veins             Value    Reference Estimated CVP             3   mm Hg ---------  Right ventricle            Value    Reference RV pressure, S, DP           27  mm Hg <=30 RV s&', lateral, S           12.2 cm/s  ---------  Legend: (L) and (H) mark values outside specified reference range.  ------------------------------------------------------------------- Prepared and Electronically Authenticated by  Jenkins Rouge,  M.D. 2015-12-21T15:25:44    CARDIAC CATHETERIZATION  Conclusion    FINAL CONCLUSIONS:  Minor nonobstructive coronary artery disease  Known severe aortic stenosis with a heavily calcified aortic valve and restricted aortic valve leaflet motion on plain fluoroscopy  Normal right heart hemodynamics  RECOMMENDATIONS:  Cardiac surgical evaluation by Dr Cyndia Bent for consideration of aortic valve replacement/TAVR    Coronary Findings    Dominance: Right   Left Anterior Descending  The vessel exhibits minimal luminal irregularities.   . Prox LAD lesion, 30% stenosed. calcified .     Left Circumflex  The vessel exhibits minimal luminal irregularities.     Right Coronary Artery  The vessel exhibits minimal luminal irregularities.      Left Heart    Aortic Valve The aortic valve on plain fluoroscopy has bulky severe leaflet calcification with restricted mobility.    Coronary Diagrams    Diagnostic Diagram            Implants    Name ID Temporary Type Supply   No information to display    Hemo Data       Most Recent Value   Fick Cardiac Output  4.48 L/min   Fick Cardiac Output Index  2.1 (L/min)/BSA   RA A Wave  4 mmHg   RA V Wave  5 mmHg   RA Mean  2 mmHg   RV Systolic Pressure  27 mmHg   RV Diastolic Pressure  -1 mmHg   RV EDP  4 mmHg   PA Systolic Pressure  27 mmHg   PA Diastolic Pressure  7 mmHg   PA Mean  15 mmHg   PW A Wave  8 mmHg   PW V Wave  6 mmHg   PW Mean  5 mmHg   AO Systolic Pressure  161 mmHg   AO Diastolic Pressure  49 mmHg   AO Mean  70 mmHg   QP/QS  1   TPVR Index  7.14 HRUI   TSVR Index  33.29 HRUI   PVR SVR Ratio  0.15   TPVR/TSVR Ratio  0.21     Cardiac TAVR CT  TECHNIQUE: The patient was scanned on a Philips 256 scanner. A 120 kV retrospective scan was triggered in the descending thoracic aorta at 111 HU's. Gantry rotation speed was 270 msecs and collimation was .9 mm. No beta blockade or  nitro were given. The 3D data set was reconstructed in 5% intervals of the R-R cycle. Systolic and diastolic phases were analyzed on a dedicated work station using MPR, MIP and VRT modes. The patient received 80 cc of contrast.  FINDINGS: Aortic Valve: Severely calcified trileaflet valve. No significant calcification of the STJ or annulus  Aorta: Tortuous descending thoracic aorta with mild calcification of the inferior surface of the arch and mild ascending aortic root dilatation  Sinotubular Junction: 31 mm  Ascending Thoracic Aorta: 36 mm  Aortic Arch: 33 mm  Descending Thoracic Aorta: 26 mm  Sinus of Valsalva Measurements:  Non-coronary: 36 mm  Right -coronary: 35 mm  Left -coronary: 36 mm  Coronary Artery Height above Annulus:  Left Main: 15 mm  Right Coronary: 17 mm  Virtual Basal Annulus Measurements:  Maximum/Minimum Diameter: 32.2 mm x 25.4 mm  Perimeter: 90.63 mm  Area: 618 mm2  Coronary Arteries: Sufficient height above annulus for deployment and no anomalous origin see cardiac cath report  Optimum Fluoroscopic Angle for Delivery: LAO 12 degrees and Cranial 1 degree  IMPRESSION: 1) Severely calcified tri leaflet aortic valve suitable for a 29 mm [place] 3 vlave Annular Area 618 mm2  2) Tortuous descending thoracic aorta with mild aortic root dilatation no contraindications to transfemoral delivery  3) Suitable coronary ostia height for deployment  4) No LAA thrombus  5) Optimum Angiographic Angle for Delivery LAO 12 degrees Cranial 1 Degree  Jenkins Rouge   Electronically Signed  By: Jenkins Rouge M.D.  On: 02/27/2015 10:49        CT ANGIOGRAPHY CHEST, ABDOMEN AND PELVIS  TECHNIQUE: Multidetector CT imaging through the chest, abdomen and pelvis was performed using the standard protocol during bolus administration of intravenous contrast. Multiplanar reconstructed images and MIPs  were obtained and reviewed to evaluate the vascular anatomy.  CONTRAST: 70 mL OMNIPAQUE IOHEXOL 350 MG/ML SOLN  COMPARISON: CT the abdomen and pelvis 02/25/2005.  FINDINGS: CTA CHEST FINDINGS  Mediastinum/Lymph Nodes: Heart size is enlarged. There is no significant pericardial fluid, thickening or pericardial calcification. There is atherosclerosis of the thoracic aorta, the great vessels of the mediastinum and the coronary arteries, including calcified atherosclerotic plaque in the left main, left anterior descending, left circumflex and right coronary arteries. Severe calcifications of the aortic valve. No pathologically enlarged mediastinal or hilar lymph nodes. Esophagus is unremarkable in appearance. No axillary lymphadenopathy.  Lungs/Pleura: No acute consolidative airspace disease. No pleural effusions. No suspicious appearing pulmonary nodules or masses. Areas of mild scarring and/or subsegmental atelectasis are noted throughout the dependent portions of the lower lobes of the lungs bilaterally.  Musculoskeletal/Soft Tissues: Status post right shoulder arthroplasty. There are no aggressive appearing lytic or blastic lesions noted in the visualized portions of the skeleton.  CTA ABDOMEN AND PELVIS FINDINGS  Hepatobiliary: No cystic or solid hepatic lesions. No intra or extrahepatic biliary ductal dilatation. Gallbladder is normal in appearance.  Pancreas: No pancreatic mass. No pancreatic ductal dilatation. No pancreatic or peripancreatic fluid or inflammatory changes.  Spleen: Unremarkable.  Adrenals/Urinary Tract: Bilateral adrenal glands are normal in appearance. Several sub cm low-attenuation lesions are noted in the kidneys bilaterally, too small to definitively characterize, but favored to represent small cysts. In addition, there is a well-defined 2.9 cm low-attenuation nonenhancing lesion in the lower pole of the left kidney, compatible with a  simple cyst. No hydroureteronephrosis. Urinary bladder is normal in appearance.  Stomach/Bowel: Normal appearance of the stomach. No pathologic dilatation of small bowel or colon. Numerous colonic diverticulae are noted, particularly in the descending and sigmoid colon, without surrounding inflammatory changes to suggest an acute diverticulitis at this time. Normal appendix.  Vascular/Lymphatic: Vascular findings and measurements pertinent to potential TVR procedure, as detailed below. No lymphadenopathy noted in the abdomen or pelvis.  Reproductive: Fiducial markers in the prostate gland, presumably  related to prior radiation therapy. Prostate gland and seminal vesicles are otherwise unremarkable in appearance.  Other: No significant volume of ascites. No pneumoperitoneum.  Musculoskeletal: There are no aggressive appearing lytic or blastic lesions noted in the visualized portions of the skeleton.  VASCULAR MEASUREMENTS PERTINENT TO TAVR:  AORTA:  Minimal Aortic Diameter - 13 x 15 mm  Severity of Aortic Calcification - mild  RIGHT PELVIS:  Right Common Iliac Artery -  Minimal Diameter - 10.1 x 9.3 mm  Tortuosity - mild  Calcification - mild  Right External Iliac Artery -  Minimal Diameter - 11.0 x 9.8 mm  Tortuosity - severe  Calcification - none  Right Common Femoral Artery -  Minimal Diameter - 10.7 x 9.2 mm  Tortuosity - mild  Calcification - none  LEFT PELVIS:  Left Common Iliac Artery -  Minimal Diameter - 9.4 x 7.8 mm  Tortuosity - mild  Calcification - mild  Left External Iliac Artery -  Minimal Diameter - 9.2 x 10.6 mm  Tortuosity - severe  Calcification - none  Left Common Femoral Artery -  Minimal Diameter - 10.4 x 11.0 mm  Tortuosity - mild  Calcification - mild  Review of the MIP images confirms the above findings.  IMPRESSION: 1. Vascular findings and measurements pertinent to  potential TVR procedure, as detailed above. This patient does appear to have suitable pelvic arterial access. Please be aware, however, that there is extensive tortuosity of the external iliac arteries bilaterally. 2. Extensive calcifications of the aortic valve, compatible with the reported clinical history of severe aortic stenosis. 3. Colonic diverticulosis without evidence of acute diverticulitis at this time. 4. Additional incidental findings, as above.   Electronically Signed  By: Vinnie Langton M.D.  On: 02/27/2015 11:24   Results for BRENNAN, LITZINGER (MRN [id number]) as of 03/05/2015 14:26  Ref. Range 02/27/2015 09:30  FVC-Pre Latest Units: L 3.35  FVC-%Pred-Pre Latest Units: % 96  FEV1-Pre Latest Units: L 2.59  FEV1-%Pred-Pre Latest Units: % 109  Pre FEV1/FVC ratio Latest Units: % 77  FEV1FVC-%Pred-Pre Latest Units: % 110  FEF 25-75 Pre Latest Units: L/sec 2.21  FEF2575-%Pred-Pre Latest Units: % 152  FEV6-Pre Latest Units: L 3.35  FEV6-%Pred-Pre Latest Units: % 104  Pre FEV6/FVC Ratio Latest Units: % 100  FEV6FVC-%Pred-Pre Latest Units: % 108  FVC-Post Latest Units: L 3.45  FVC-%Pred-Post Latest Units: % 99  FVC-%Change-Post Latest Units: % 2  FEV1-Post Latest Units: L 2.82  FEV1-%Pred-Post Latest Units: % 118  FEV1-%Change-Post Latest Units: % 8  Post FEV1/FVC ratio Latest Units: % 82  FEV1FVC-%Change-Post Latest Units: % 5  FEF 25-75 Post Latest Units: L/sec 3.24  FEF2575-%Pred-Post Latest Units: % 223  FEF2575-%Change-Post Latest Units: % 46  FEV6-Post Latest Units: L 3.45  FEV6-%Pred-Post Latest Units: % 107  FEV6-%Change-Post Latest Units: % 2  Post FEV6/FVC ratio Latest Units: % 100  FEV6FVC-%Pred-Post Latest Units: % 108  TLC Latest Units: L 7.66  TLC % pred Latest Units: % 111  RV Latest Units: L 4.12  RV % pred Latest Units: % 148  DLCO unc Latest Units: ml/min/mmHg 28.52  DLCO unc % pred Latest Units: % 92  DL/VA Latest Units: ml/min/mmHg/L  4.49  DL/VA % pred Latest Units: % 99     STS Risk Calculator  Procedure    AVR  Risk of Mortality   2.8% Morbidity or Mortality  19.6% Prolonged LOS   9.3% Short LOS    23.9% Permanent Stroke  3.1% Prolonged Vent Support  11.4% DSW Infection    0.3% Renal Failure    6.0% Reoperation    8.3%    Impression:  Patient has stage D severe symptomatic aortic stenosis with normal left ventricular systolic function but severe left ventricular hypertrophy and 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  I have personally reviewed the patient's most recent transthoracic echocardiogram, cardiac catheterization and CT angiograms. There is moderate to severe thickening and calcification involving all 3 leaflets of the patient's aortic valve. There is restricted leaflet mobility and peak velocity across the aortic valve measured 4 m/s. The patient does have severe left ventricular hypertrophy with a very prominent septal bulge.  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  The patient did not have systolic anterior motion of the mitral valve.  Diagnostic cardiac catheterization is notable for the absence of significant coronary artery disease. Direct measurement of pressure gradients across the aortic valve and left ventricular output tract could not be obtained at the time of catheterization.  Risks associated with conventional surgical aortic valve replacement with or without septal myomectomy would be at least moderately elevated because of the patient's advanced age and history of cerebrovascular disease.   Cardiac gated CT angiogram of the heart confirmed the presence of severe aortic  stenosis with anatomical findings suitable for transcatheter aortic valve replacement using a 29 mm Edwards Sapien 3 transcatheter heart valve. There are no significant complicating features with exception of the presence of significant septal hypertrophy and narrowing of the left ventricular outflow tract.  It should be noted that the cross-sectional area in the left ventricular outflow tract is substantially smaller than the cross-sectional area at the level of the aortic valve annulus.  CT angiogram of the abdomen and pelvis demonstrates that the patient has adequate pelvic vascular access for transfemoral approach for TAVR.   Plan:  The patient and his wife were counseled at length regarding treatment alternatives for management of severe symptomatic aortic stenosis and hypertrophic obstructive cardiomyopathy.  Alternative approaches such as conventional aortic valve replacement with or without septal myomectomy, transcatheter aortic valve replacement, and palliative medical therapy were compared and contrasted at length.  The risks associated with conventional surgical aortic valve replacement with septal myomectomy were been discussed in detail, as were expectations for post-operative convalescence. This discussion was placed in the context of the patient's own specific clinical presentation and past medical history.  The implications of the presence of significant dynamic left ventricular outflow tract obstruction and hypertrophic obstructive cardiomyopathy have been discussed as well.  Long-term prognosis with medical therapy was discussed.  I favor proceeding with transesophageal echocardiogram to further evaluate the presence and severity of septal hypertrophy and hypertrophic obstructive cardiomyopathy. If TEE confirms findings consistent with significant subvalvular obstruction I tend to favor conventional surgery which offers the ability to definitively treat the left ventricular outflow tract  obstruction and avoid any potential complications related to malposition of a transcatheter heart valve.  The patient will schedule transesophageal echocardiogram as soon as practical with Dr. Meda Coffee. Findings will be discussed at length by our multidisciplinary heart valve team before making final recommendations.  I spent in excess of 90 minutes during the conduct of this office consultation and >50% of this time involved direct face-to-face encounter with the patient  for counseling and/or coordination of their care.    Valentina Gu. Roxy Manns, MD 03/05/2015 1:20 PM

## 2015-03-05 NOTE — Progress Notes (Signed)
[facility] AND [facility]    CARDIOTHORACIC SURGERY CONSULTATION REPORT  Referring Provider is Dorothy Spark, MD PCP is Joycelyn Man, MD  Chief Complaint  Patient presents with  . Aortic Stenosis    SEVERE...EVAL FOR TAVR.Marland KitchenECHO 08/05/14.Marland KitchenMarland KitchenCATH 02/20/15.Marland KitchenMarland KitchenPFT 02/27/15    HPI:  The patient is an 81 year old gentleman with a long history of hypertension, hypertrophic cardiomyopathy and stroke last year. He had expressive aphasia that resolved but he still has balance problems. He saw Dr. Meda Coffee at that time and an echo showed moderate to severe aortic stenosis with a mean gradient of 34 mm Hg and a peak of 56 mm Hg. There was severe concentric LVH with a resting LVOT gradient of 30 mm Hg increasing to 105 mm Hg with Valsalva and a hyperdynamic LV. He was started on Toprol and echo in December 2015 [facility] and LVOT obstruction had resolved with initiation of Toprol. However he had a residual mid-cavitary gradient of 80 mmHg. He has remained fairly active over the past few years going to the [facility] 3 times per week for about 30 minutes doing light weight training and riding a stationary bike and using the elliptical machine. He says that he quit going to the [facility] recently after he was referred for cath but is still riding a stationary bike at home. He does report some tiredness and exertional fatigue with activities like pushing his trash cans but does his daily routine without symptoms other than tiredness. He has had some dizziness last year but none recently. He denies chest pain. He underwent cath by Dr. Burt Knack on 02/20/2015 showing minor non-obstructive coronary disease and normal right heart hemodynamics.  He lives with his wife who is in good health. He is still very independent and drives his car but recently started using a cane to to balance problems because he was concerned that he might fall.  Past Medical History  Diagnosis Date  . HYPERTENSION  05/24/2007    Qualifier: Diagnosis of  By: Scherrie Gerlach    . DEGENERATIVE JOINT DISEASE, SPINE 05/24/2007    Qualifier: Diagnosis of  By: Scherrie Gerlach    . ERECTILE DYSFUNCTION 05/24/2007    Qualifier: Diagnosis of  By: Scherrie Gerlach    . HEARING LOSS 05/24/2007    Qualifier: Diagnosis of  By: Scherrie Gerlach    . Aphasia 01/21/2014    5 min spell without assoc sx    . CATARACTS, BILATERAL 05/24/2007    Qualifier: Diagnosis of  By: Scherrie Gerlach    . Heart murmur 01/21/2014    undefined apparently present for years    . History of prostate cancer 10/08/2010  . Spell of change in speech 01/21/2014    see above no slurring just couldnt get words out.   . Sleep apnea   . OA (osteoarthritis)   . Cancer     Past Surgical History  Procedure Laterality Date  . Prostate surgery    . Joint replacement    . Hemiarthroplasty shoulder fracture    . Cataract extraction      right 2004, left 2010  . Cardiac catheterization N/A 02/20/2015    Procedure: Right/Left Heart Cath and Coronary Angiography;  Surgeon: Sherren Mocha, MD;  Location: [facility];  Service: Cardiovascular;  Laterality: N/A;    Family History  Problem Relation Age of Onset  . Heart disease Mother   . Cancer Father   . Cancer Sister   .  Prostate cancer Brother   . Heart disease Brother   . Lung cancer Brother   . CVA Mother   . CVA Father     History   Social History  . Marital Status: Married    Spouse Name: N/A  . Number of Children: N/A  . Years of Education: N/A   Occupational History  . Not on file.   Social History Main Topics  . Smoking status: Never Smoker   . Smokeless tobacco: Not on file  . Alcohol Use: No  . Drug Use: No  . Sexual Activity: No   Other Topics Concern  . Not on file   Social History Narrative    Current Outpatient Prescriptions  Medication Sig Dispense Refill  . clopidogrel (PLAVIX) 75 MG tablet Take 75 mg by mouth every morning.    . docusate sodium (COLACE)  100 MG capsule Take 200 mg by mouth every morning.     . metoprolol tartrate (LOPRESSOR) 25 MG tablet Take 12.5 mg by mouth 3 (three) times a week. Monday , Wednesday , Friday    . pravastatin (PRAVACHOL) 10 MG tablet Take 10 mg by mouth at bedtime.    . tamsulosin (FLOMAX) 0.4 MG CAPS capsule Take 0.4 mg by mouth 3 (three) times a week. Monday Wednesday Fridday     No current facility-administered medications for this visit.    No Known Allergies    Review of Systems:   General:  normal appetite, decreased energy, no weight gain, no weight loss, no fever  Cardiac:  no chest pain with exertion, no chest pain at rest, mild SOB with mild exertion, no resting SOB, no PND, no orthopnea, no palpitations, no arrhythmia, no atrial fibrillation, no LE edema, no dizzy spells, no syncope  Respiratory:  no shortness of breath, no home oxygen, no productive cough, no dry cough, no bronchitis, no wheezing, no hemoptysis, no asthma, no pain with inspiration or cough, no sleep apnea, no CPAP at night  GI:   no difficulty swallowing, no reflux, no frequent heartburn, no hiatal hernia, no abdominal pain, no constipation, no diarrhea, no hematochezia, no hematemesis, no melena  GU:   no dysuria,  no frequency, no urinary tract infection, no hematuria, no enlarged prostate, no kidney stones, no kidney disease  Vascular:  no pain suggestive of claudication, no pain in feet, no leg cramps, no varicose veins, no DVT, no non-healing foot ulcer  Neuro:   Stoke vs TIA last June 2015, no seizures, no headaches, notemporary blindness one eye,  no slurred speech, no peripheral neuropathy, no chronic pain, some instability of gait, no memory/cognitive dysfunction  Musculoskeletal: Lower extremity arthritis, no joint swelling, no myalgias, no difficulty walking, normal mobility but some balance problems  Skin:   no rash, no itching, no skin infections, no pressure sores or ulcerations  Psych:   no anxiety, no depression,  no nervousness, no unusual recent stress  Eyes:   no blurry vision, no floaters, no recent vision changes,  wears glasses or contacts  ENT:   has hearing loss and wears hearing aid, no loose or painful teeth  Hematologic:  no easy bruising, no abnormal bleeding, no clotting disorder, no frequent epistaxis  Endocrine:  no diabetes           Physical Exam:   BP 105/63 mmHg  Pulse 86  Resp 20  Ht 5\' 9"  (1.753 m)  Wt 210 lb (95.255 kg)  BMI 31.00 kg/m2  SpO2 96%  General:  Elderly,   well-appearing, in no distress  HEENT:  Unremarkable , NCAT, PERLA, EOMI, oropharynx clear  Neck:   no JVD, no bruits, no adenopathy or thyromegaly, carotid pulses normal with transmitted murmur to both sides of neck.  Chest:   clear to auscultation, symmetrical breath sounds, no wheezes, no rhonchi   CV:   RRR, grade III/VI crescendo/decrescendo murmur heard best at RSB,  no diastolic murmur  Abdomen:  soft, non-tender, no masses or organomegaly  Extremities:  warm, well-perfused, pulses palpable in feet, no LE edema  Rectal/GU  Deferred  Neuro:   Grossly non-focal and symmetrical throughout  Skin:   Clean and dry, no rashes, no breakdown   Diagnostic Tests:              [facility]            [address], [place] [address]              [phone]  ------------------------------------------------------------------- Transthoracic Echocardiography  Patient:  Dylin, Breeden MR #:    [id number] Study Date: 08/05/2014 Gender:   M Age:    66 Height:   177.8 cm Weight:   97.5 kg BSA:    2.22 m^2 Pt. Status: Room:  ATTENDING  Jenkins Rouge, M.D. SONOGRAPHER Victorio Palm, RDCS ORDERING   Ena Dawley, M.D. REFERRING  Ena Dawley, M.D. PERFORMING  [facility], Outpatient  cc:  ------------------------------------------------------------------- LV EF: 55% -   60%  ------------------------------------------------------------------- Indications:   HOCM (I42.1). Aortic stenosis. (I35.0).  ------------------------------------------------------------------- History:  PMH: Acquired from the patient and from the patient&'s chart. 5/6 Holosystolic murmur. Hypertrophic cardiomyopathy, with an ejection fraction of 70%by echocardiography. Outflow obstruction occurs with provocation. Borderline significant aortic stenosis. Risk factors: Hypertension.  ------------------------------------------------------------------- Study Conclusions  - Left ventricle: The cavity size was normal. Wall thickness was increased in a pattern of severe LVH. Systolic function was normal. The estimated ejection fraction was in the range of 55% to 60%. There was dynamic obstruction during Valsalvain the outflow tract, with mid-cavity obliteration, a peak velocity of 448 cm/sec, and a peak gradient of 80 mm Hg. Doppler parameters are consistent with abnormal left ventricular relaxation (grade 1 diastolic dysfunction). - Aortic valve: Likely trileaflet with fused right and left cusps. There was severe stenosis. - Left atrium: The atrium was moderately dilated. - Atrial septum: No defect or patent foramen ovale was identified. - Impressions: No LVOT gradient or SAM appreciated Primary issues appears to be fixed valvular aortic stenosis  Impressions:  - No LVOT gradient or SAM appreciated Primary issues appears to be fixed valvular aortic stenosis  ------------------------------------------------------------------- Labs, prior tests, procedures, and surgery: Echocardiography (June 2015).  The study demonstrated systemic outflow obstruction. EF was 70%. Severe concentric LVH. LVOT obstruction increase from 50mmHG to 150 mmHG with Valsalva.  Transthoracic echocardiography. M-mode, complete 2D, spectral Doppler, and color Doppler.  Birthdate: Patient birthdate: [birth year]. Age: Patient is 81 yr old. Sex: Gender: male. BMI: 30.8 kg/m^2. Blood pressure:   130/75 Patient status: Outpatient. Study date: Study date: 08/05/2014. Study time: 02:02 PM. Location: [facility]  -------------------------------------------------------------------  ------------------------------------------------------------------- Left ventricle: The cavity size was normal. Wall thickness was increased in a pattern of severe LVH. Systolic function was normal. The estimated ejection fraction was in the range of 55% to 60%. There was dynamic obstruction during Valsalvain the outflow tract, with mid-cavity obliteration, a peak velocity of 448 cm/sec, and  a peak gradient of 80 mm Hg. Doppler parameters are consistent with abnormal left ventricular relaxation (grade 1 diastolic dysfunction).  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 Severely calcified leaflets. Doppler:  There was severe stenosis.   Mean gradient (S): 34 mm Hg. Peak gradient (S): 66 mm Hg.  ------------------------------------------------------------------- Aorta: The aorta was normal, not dilated, and non-diseased.  ------------------------------------------------------------------- Mitral valve:  Mildly thickened leaflets . Doppler: There was trivial regurgitation.  Peak gradient (D): 2 mm Hg.  ------------------------------------------------------------------- Left atrium: The atrium was moderately dilated.  ------------------------------------------------------------------- Atrial septum: No defect or patent foramen ovale was identified.  ------------------------------------------------------------------- Right ventricle: The cavity size was normal. Wall thickness was normal. Systolic function was  normal.  ------------------------------------------------------------------- Pulmonic valve:  Structurally normal valve.  Cusp separation was normal. Doppler: Transvalvular velocity was within the normal range. There was trivial regurgitation.  ------------------------------------------------------------------- Tricuspid valve:  Structurally normal valve.  Leaflet separation was normal. Doppler: Transvalvular velocity was within the normal range. There was mild regurgitation.  ------------------------------------------------------------------- Right atrium: The atrium was normal in size.  ------------------------------------------------------------------- Pericardium: The pericardium was normal in appearance.  ------------------------------------------------------------------- Post procedure conclusions Ascending Aorta:  - The aorta was normal, not dilated, and non-diseased.  ------------------------------------------------------------------- Measurements  Left ventricle             Value    Reference LV ID, ED, PLAX chordal        45  mm   43 - 52 LV ID, ES, PLAX chordal    (L)   22  mm   23 - 38 LV fx shortening, PLAX chordal     51  %   >=29 LV PW thickness, ED          15  mm   --------- IVS/LV PW ratio, ED      (H)   1.4     <=1.3 LV e&', lateral             4.35 cm/s  --------- LV E/e&', lateral            16.39    --------- LV e&', medial             3.7  cm/s  --------- LV E/e&', medial            19.27    --------- LV e&', average             4.03 cm/s  --------- LV E/e&', average            17.71    ---------  Ventricular septum           Value    Reference IVS thickness, ED           21  mm   ---------  LVOT                   Value    Reference LVOT ID, S               20  mm   --------- LVOT area               3.14 cm^2  ---------  Aortic valve              Value    Reference Aortic valve peak velocity, S     406  cm/s  --------- Aortic valve mean velocity, S     275  cm/s  --------- Aortic valve VTI, S  97.8 cm   --------- Aortic mean gradient, S        34  mm Hg --------- Aortic peak gradient, S        66  mm Hg ---------  Aorta                 Value    Reference Aortic root ID, ED           35  mm   ---------  Left atrium              Value    Reference LA ID, A-P, ES             47  mm   --------- LA ID/bsa, A-P             2.12 cm/m^2 <=2.2 LA volume, S              104  ml   --------- LA volume/bsa, S            46.8 ml/m^2 --------- LA volume, ES, 1-p A4C         105  ml   --------- LA volume/bsa, ES, 1-p A4C       47.3 ml/m^2 --------- LA volume, ES, 1-p A2C         104  ml   --------- LA volume/bsa, ES, 1-p A2C       46.8 ml/m^2 ---------  Mitral valve              Value    Reference Mitral E-wave peak velocity      71.3 cm/s  --------- Mitral deceleration time    (H)   370  ms   150 - 230 Mitral peak gradient, D        2   mm Hg --------- Mitral E/A ratio, peak         0.6     ---------  Pulmonary arteries           Value    Reference PA pressure, S, DP           27  mm Hg <=30  Tricuspid valve            Value    Reference Tricuspid regurg peak velocity     247  cm/s  --------- Tricuspid peak RV-RA gradient     24  mm Hg ---------  Systemic veins             Value     Reference Estimated CVP             3   mm Hg ---------  Right ventricle            Value    Reference RV pressure, S, DP           27  mm Hg <=30 RV s&', lateral, S           12.2 cm/s  ---------  Legend: (L) and (H) mark values outside specified reference range.  ------------------------------------------------------------------- Prepared and Electronically Authenticated by  Jenkins Rouge, M.D. 2015-12-21T15:25:44   Cardiac Cath:  Conclusion    FINAL CONCLUSIONS:  Minor nonobstructive coronary artery disease  Known severe aortic stenosis with a heavily calcified aortic valve and restricted aortic valve leaflet motion on plain fluoroscopy  Normal right heart hemodynamics  RECOMMENDATIONS:  Cardiac surgical evaluation by Dr Cyndia Bent for consideration of aortic valve replacement/TAVR    Coronary Findings    Dominance:  Right   Left Anterior Descending  The vessel exhibits minimal luminal irregularities.   . Prox LAD lesion, 30% stenosed. calcified .     Left Circumflex  The vessel exhibits minimal luminal irregularities.     Right Coronary Artery  The vessel exhibits minimal luminal irregularities.      Left Heart    Aortic Valve The aortic valve on plain fluoroscopy has bulky severe leaflet calcification with restricted mobility.    Coronary Diagrams    Diagnostic Diagram            Implants    Name ID Temporary Type Supply   No information to display    Hemo Data       Most Recent Value   Fick Cardiac Output  4.48 L/min   Fick Cardiac Output Index  2.1 (L/min)/BSA   RA A Wave  4 mmHg   RA V Wave  5 mmHg   RA Mean  2 mmHg   RV Systolic Pressure  27 mmHg   RV Diastolic Pressure  -1 mmHg   RV EDP  4 mmHg   PA Systolic Pressure  27 mmHg   PA Diastolic Pressure  7 mmHg   PA Mean  15 mmHg   PW A Wave  8 mmHg   PW V Wave  6 mmHg   PW Mean  5 mmHg   AO  Systolic Pressure  573 mmHg   AO Diastolic Pressure  49 mmHg   AO Mean  70 mmHg   QP/QS  1   TPVR Index  7.14 HRUI   TSVR Index  33.29 HRUI   PVR SVR Ratio  0.15   TPVR/TSVR Ratio  0.21    Order-Level Documents:    There are no order-level documents.    Encounter-Level Documents - 02/06/15:      Scan on 02/21/2015 10:57 AM by Provider Default, MDScan on 02/21/2015 10:57 AM by Provider Default, MD     Scan on 02/21/2015 10:22 AM by Provider Default, MDScan on 02/21/2015 10:22 AM by Provider Default, MD     Scan on 02/20/2015 10:29 AM by Provider Default, MDScan on 02/20/2015 10:29 AM by Provider Default, MD     Electronic signature on 02/20/2015 6:54 AM    Signed    Electronically signed by Sherren Mocha, MD on 02/20/15 at 1039 EDT   Josue Hector, MD  Thu Feb 27, 2015 10:51:42 AM EDT     ADDENDUM REPORT: 02/27/2015 10:49  CLINICAL DATA: Aortic stenosis  EXAM: Cardiac TAVR CT  TECHNIQUE: The patient was scanned on a Philips [id number] scanner. A 120 kV retrospective scan was triggered in the descending thoracic aorta at 111 HU's. Gantry rotation speed was 270 msecs and collimation was .9 mm. No beta blockade or nitro were given. The 3D data set was reconstructed in 5% intervals of the R-R cycle. Systolic and diastolic phases were analyzed on a dedicated work station using MPR, MIP and VRT modes. The patient received 80 cc of contrast.  FINDINGS: Aortic Valve: Severely calcified trileaflet valve. No significant calcification of the STJ or annulus  Aorta: Tortuous descending thoracic aorta with mild calcification of the inferior surface of the arch and mild ascending aortic root dilatation  Sinotubular Junction: 31 mm  Ascending Thoracic Aorta: 36 mm  Aortic Arch: 33 mm  Descending Thoracic Aorta: 26 mm  Sinus of Valsalva Measurements:  Non-coronary: 36 mm  Right -coronary: 35 mm  Left -coronary: 36 mm  Coronary Artery Height  above Annulus:  Left Main: 15 mm  Right Coronary: 17 mm  Virtual Basal Annulus Measurements:  Maximum/Minimum Diameter: 32.2 mm x 25.4 mm  Perimeter: 90.63 mm  Area: 618 mm2  Coronary Arteries: Sufficient height above annulus for deployment and no anomalous origin see cardiac cath report  Optimum Fluoroscopic Angle for Delivery: LAO 12 degrees and Cranial 1 degree  IMPRESSION: 1) Severely calcified tri leaflet aortic valve suitable for a 29 mm S[place]in 3 vlave Annular Area 618 mm2  2) Tortuous descending thoracic aorta with mild aortic root dilatation no contraindications to transfemoral delivery  3) Suitable coronary ostia height for deployment  4) No LAA thrombus  5) Optimum Angiographic Angle for Delivery LAO 12 degrees Cranial 1 Degree  Jenkins Rouge   Electronically Signed  By: Jenkins Rouge M.D.  On: 02/27/2015 10:49      Study Result     EXAM: OVER-READ INTERPRETATION CT CHEST  The following report is an over-read performed by radiologist Dr. Rebekah [facility] on 02/27/2015. This over-read does not include interpretation of cardiac or coronary anatomy or pathology. The coronary calcium score/coronary CTA interpretation by the cardiologist is attached.  COMPARISON: No priors.  FINDINGS: A contemporaneously CTA of the chest, abdomen and pelvis was also performed today.  IMPRESSION: Please see separate dictation for contemporaneously obtained CTA of the chest, abdomen and pelvis for full description of noncardiac findings.  Electronically Signed: By: Vinnie Langton M.D. On: 02/27/2015 10:22    CLINICAL DATA: 81 year old male with history of severe aortic stenosis. Preprocedural study prior to potential transcatheter aortic valve replacement.  EXAM: CT ANGIOGRAPHY CHEST, ABDOMEN AND PELVIS  TECHNIQUE: Multidetector CT imaging through the chest, abdomen and pelvis was performed  using the standard protocol during bolus administration of intravenous contrast. Multiplanar reconstructed images and MIPs were obtained and reviewed to evaluate the vascular anatomy.  CONTRAST: 70 mL OMNIPAQUE IOHEXOL 350 MG/ML SOLN  COMPARISON: CT the abdomen and pelvis 02/25/2005.  FINDINGS: CTA CHEST FINDINGS  Mediastinum/Lymph Nodes: Heart size is enlarged. There is no significant pericardial fluid, thickening or pericardial calcification. There is atherosclerosis of the thoracic aorta, the great vessels of the mediastinum and the coronary arteries, including calcified atherosclerotic plaque in the left main, left anterior descending, left circumflex and right coronary arteries. Severe calcifications of the aortic valve. No pathologically enlarged mediastinal or hilar lymph nodes. Esophagus is unremarkable in appearance. No axillary lymphadenopathy.  Lungs/Pleura: No acute consolidative airspace disease. No pleural effusions. No suspicious appearing pulmonary nodules or masses. Areas of mild scarring and/or subsegmental atelectasis are noted throughout the dependent portions of the lower lobes of the lungs bilaterally.  Musculoskeletal/Soft Tissues: Status post right shoulder arthroplasty. There are no aggressive appearing lytic or blastic lesions noted in the visualized portions of the skeleton.  CTA ABDOMEN AND PELVIS FINDINGS  Hepatobiliary: No cystic or solid hepatic lesions. No intra or extrahepatic biliary ductal dilatation. Gallbladder is normal in appearance.  Pancreas: No pancreatic mass. No pancreatic ductal dilatation. No pancreatic or peripancreatic fluid or inflammatory changes.  Spleen: Unremarkable.  Adrenals/Urinary Tract: Bilateral adrenal glands are normal in appearance. Several sub cm low-attenuation lesions are noted in the kidneys bilaterally, too small to definitively characterize, but favored to represent small cysts. In addition,  there is a well-defined 2.9 cm low-attenuation nonenhancing lesion in the lower pole of the left kidney, compatible with a simple cyst. No hydroureteronephrosis. Urinary bladder is normal in appearance.  Stomach/Bowel: Normal appearance of the stomach. No pathologic dilatation of small  bowel or colon. Numerous colonic diverticulae are noted, particularly in the descending and sigmoid colon, without surrounding inflammatory changes to suggest an acute diverticulitis at this time. Normal appendix.  Vascular/Lymphatic: Vascular findings and measurements pertinent to potential TVR procedure, as detailed below. No lymphadenopathy noted in the abdomen or pelvis.  Reproductive: Fiducial markers in the prostate gland, presumably related to prior radiation therapy. Prostate gland and seminal vesicles are otherwise unremarkable in appearance.  Other: No significant volume of ascites. No pneumoperitoneum.  Musculoskeletal: There are no aggressive appearing lytic or blastic lesions noted in the visualized portions of the skeleton.  VASCULAR MEASUREMENTS PERTINENT TO TAVR:  AORTA:  Minimal Aortic Diameter - 13 x 15 mm  Severity of Aortic Calcification - mild  RIGHT PELVIS:  Right Common Iliac Artery -  Minimal Diameter - 10.1 x 9.3 mm  Tortuosity - mild  Calcification - mild  Right External Iliac Artery -  Minimal Diameter - 11.0 x 9.8 mm  Tortuosity - severe  Calcification - none  Right Common Femoral Artery -  Minimal Diameter - 10.7 x 9.2 mm  Tortuosity - mild  Calcification - none  LEFT PELVIS:  Left Common Iliac Artery -  Minimal Diameter - 9.4 x 7.8 mm  Tortuosity - mild  Calcification - mild  Left External Iliac Artery -  Minimal Diameter - 9.2 x 10.6 mm  Tortuosity - severe  Calcification - none  Left Common Femoral Artery -  Minimal Diameter - 10.4 x 11.0 mm  Tortuosity - mild  Calcification -  mild  Review of the MIP images confirms the above findings.  IMPRESSION: 1. Vascular findings and measurements pertinent to potential TVR procedure, as detailed above. This patient does appear to have suitable pelvic arterial access. Please be aware, however, that there is extensive tortuosity of the external iliac arteries bilaterally. 2. Extensive calcifications of the aortic valve, compatible with the reported clinical history of severe aortic stenosis. 3. Colonic diverticulosis without evidence of acute diverticulitis at this time. 4. Additional incidental findings, as above.   Electronically Signed  By: Vinnie Langton M.D.  On: 02/27/2015 11:24   STS Risk Calculator Procedure: AV Replacement Risk of Mortality: 3.002% Morbidity or Mortality: 20.623% Long Length of Stay: 9.881% Short Length of Stay: 22.766% Permanent Stroke: 3.227% Prolonged Ventilation: 12.03% DSW Infection: 0.269% Renal Failure: 6.59% Reoperation: 8.553%  Impression:  This 81 year old gentleman has severe stage D symptomatic aortic stenosis in addition to longstanding hypertrophic cardiomyopathy. I have personally reviewed his echo studies, catheterization, and CT scans. He has a trileaflet aortic valve with fusion of the left and right cusps with severe calcification and stenosis. 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 There is a mid-cavitary gradient of 80 mm Hg during valsalva. There is no distinct septal bulge. He has tolerated his hypertrophic cardiomyopathy well but has developed some progression of exertional fatigue and dyspnea over the past year or so that is probably related to progression of his aortic stenosis. I think he would be a high risk candidate for open surgical AVR due to his advanced age and prior stroke with some ongoing balance problems. I think TAVR would be a reasonable option for him. The hypertrophic ventricle could pose some risk  of valve dislodgement during deployment. I don't think a septal myectomy with open AVR would resolve his mid-cavitary gradient. His CT scans show that he would be a candidate for a 29 mm Sapien 3 valve via a transfemoral route.  The patient has been counseled extensively as to the relative risks and benefits of all options for the treatment of severe aortic stenosis including long term medical therapy, conventional surgery for aortic valve replacement, and transcatheter aortic valve replacement.  Following the decision to consider proceeding with further evaluation for transcatheter aortic valve replacement, a discussion has been held regarding what types of management strategies would be attempted intraoperatively in the event of life-threatening complications, including whether or not the patient would be considered a candidate for the use of cardiopulmonary bypass and/or conversion to open sternotomy for attempted surgical intervention.  The patient has been advised of a variety of complications that might develop including but not limited to risks of death, stroke, paravalvular leak, aortic dissection or other major vascular complications, aortic annulus rupture, device embolization, cardiac rupture or perforation, mitral regurgitation, acute myocardial infarction, arrhythmia, heart block or bradycardia requiring permanent pacemaker placement, congestive heart failure, respiratory failure, renal failure, pneumonia, infection, other late complications related to structural valve deterioration or migration, or other complications that might ultimately cause a temporary or permanent loss of functional independence or other long term morbidity.  The patient provides full informed consent for the procedure as described and all questions were answered.   Plan:   He will return to have a second surgical evaluation with Dr. Roxy Manns and will be discussed fully with the multidisciplinary heart valve team before  making a decision to proceed with TAVR.    Gaye Pollack, MD 03/04/2015

## 2015-03-05 NOTE — Patient Instructions (Signed)
The patient should continue all previous medications without changes at this time  Schedule transesophageal echocardiogram (TEE) with Dr Meda Coffee as soon as practical

## 2015-03-05 NOTE — Therapy (Signed)
[place], [place], [address] Phone: [phone]   Fax:  [phone]  Physical Therapy Evaluation  Patient Details  Name: Robert Anderson MRN: [id number] Date of Birth: Mar 19, 1924 Referring Provider:  Gaye Pollack, MD  Encounter Date: 03/05/2015      PT End of Session - 03/05/15 1257    Visit Number 1   PT Start Time 0947   PT Stop Time 1341   PT Time Calculation (min) 44 min      Past Medical History  Diagnosis Date  . HYPERTENSION 05/24/2007    Qualifier: Diagnosis of  By: Scherrie Gerlach    . DEGENERATIVE JOINT DISEASE, SPINE 05/24/2007    Qualifier: Diagnosis of  By: Scherrie Gerlach    . ERECTILE DYSFUNCTION 05/24/2007    Qualifier: Diagnosis of  By: Scherrie Gerlach    . HEARING LOSS 05/24/2007    Qualifier: Diagnosis of  By: Scherrie Gerlach    . TIA (transient ischemic attack) 01/21/2014    5 min spell aphasia without assoc sx    . CATARACTS, BILATERAL 05/24/2007    Qualifier: Diagnosis of  By: Scherrie Gerlach    . Heart murmur 01/21/2014    undefined apparently present for years    . History of prostate cancer 10/08/2010  . Aphasia 01/21/2014    see above no slurring just couldnt get words out.   . Sleep apnea   . OA (osteoarthritis)     both hips  . Squamous cell skin cancer     left lower leg  . Severe aortic stenosis 02/04/2015  . Hypertrophic obstructive cardiomyopathy 02/01/2014  . CVA (cerebral infarction) 02/19/2014    Small infarct high posterior left frontal lobe on MRI     Past Surgical History  Procedure Laterality Date  . Prostate surgery    . Joint replacement    . Hemiarthroplasty shoulder fracture    . Cataract extraction      right 2004, left 2010  . Cardiac catheterization N/A 02/20/2015    Procedure: Right/Left Heart Cath and Coronary Angiography;  Surgeon: Sherren Mocha, MD;  Location: [facility];  Service: Cardiovascular;  Laterality: N/A;    There were no  vitals filed for this visit.  Visit Diagnosis:  Difficulty walking - Plan: PT plan of care cert/re-cert  Severe aortic stenosis - Plan: PT plan of care cert/re-cert      Subjective Assessment - 03/05/15 1303    Subjective started using a cane about 3 months ago due to imbalance, had been consistently working out at [employer] about 1 hour 20 minutes 3 times/week up until about 3 weeks ago; pt reports gradual onset of shortness of breath which he noticed first with taking out the trash, no longer able to do yardwork   Pertinent History R shoulder replacement ~8 years ago, hx TIA about a year ago   Patient Stated Goals to gain mobility   Currently in Pain? No/denies            [facility] PT Assessment - 03/05/15 0001    Assessment   Medical Diagnosis severe aortic stenosis   Onset Date/Surgical Date 03/04/14  approximate   Precautions   Precaution Comments withhold community exercise at the [facility]   Restrictions   Weight Bearing Restrictions No   Balance Screen   Has the patient fallen in the past 6 months No   Has the patient had a decrease in activity  level because of a fear of falling?  No  did start using cane   Is the patient reluctant to leave their home because of a fear of falling?  No   Home Environment   Living Environment Private residence   Home Access Stairs to enter   Entrance Stairs-Number of Steps 1-2   Entrance Stairs-Rails Right;Left;Cannot reach both   Prior Function   Level of Independence Independent  with yardwork   Posture/Postural Control   Posture/Postural Control Postural limitations   Postural Limitations Rounded Shoulders;Forward head   ROM / Strength   AROM / PROM / Strength AROM;Strength   AROM   Overall AROM  Within functional limits for tasks performed   Strength   Overall Strength Comments strength grossly 4-5/5 throughout   Strength Assessment Site Hand   Right/Left hand Right;Left   Right Hand Grip (lbs) 75   Left Hand Grip (lbs) 65    Ambulation/Gait   Gait Comments Pt ambulates with single point cane, trendelenberg gait, excessive lateral trunk sway, and bil hip ER L >R. Single point cane appears to improve steadiness.          [facility] Pre-Surgical Assessment - 03/05/15 0001    5 Meter Walk Test- trial 1 5 sec   5 Meter Walk Test- trial 2 5 sec.    5 Meter Walk Test- trial 3 5 sec.  </= 6 sec WNL   5 meter walk test average 5 sec   Timed Up & Go Test trial  13 sec.  with single point cane   Comments > 12 sec indicates increased fall risk   4 Stage Balance Test tolerated for:  10 sec.   4 Stage Balance Test Position 2   comment inability to hold position 3 x 10 seconds indicates increased fall risk   Sit To Stand Test- trial 1 14 sec.   Comment </= 14.8 WNL    ADL/IADL Independent with: Bathing;Dressing;Meal prep;Finances   ADL/IADL Needs Assistance with: [employer] work   ADL/IADL Fraility Index Vulnerable   6 Minute Walk- Baseline yes   BP (mmHg) 120/62 mmHg   HR (bpm) 59   02 Sat (%RA) 94 %   Modified Borg Scale for Dyspnea 0- Nothing at all   Perceived Rate of Exertion (Borg) 6-   6 Minute Walk Post Test yes   BP (mmHg) 138/72 mmHg   HR (bpm) 92   02 Sat (%RA) 95 %   Modified Borg Scale for Dyspnea 1- Very mild shortness of breath   Perceived Rate of Exertion (Borg) 11- Fairly light   Aerobic Endurance Distance Walked 905   Endurance additional comments Pt required seated rest at 3:12 due to bil. hip pain, rested 30 seconds and able to resume. He reports this is very regular. Vitals at time of rest were 94% o@, 94 bpm, and reported SOB at 0.5 (very vey slight).                         PT Education - 03/05/15 1347    Education provided Yes   Education Details perceived rate of exertion and use of stationary bike at home with MD approval   Person(s) Educated Patient;Spouse   Methods Explanation   Comprehension Verbalized understanding                    Plan - 03/05/15 1347     Clinical Impression Statement Pt is an 81 yo male presenting  to OP PT for evaluation prior to possible TAVR surgery for severe aortic stenosis. Symptoms began about a year ago and have been gradually progressing. Pt notices increased SOB with taking out trask and he is no longer able to do his yardwork. Pt also reports he had to stop his 3x week exercise routine at the gym due to symptoms. Pt presents with good strength/ROM and fair balance. Pt's endurance is limited both by his hips and his SOB/fatigue. Pt is at increased fall risk and single point cane appears to assist him with balance.    PT Frequency One time visit          G-Codes - 2015-04-03 1455    Functional Assessment Tool Used 6 min walk test 905' with 1 rest due to hip pain   Functional Limitation Mobility: Walking and moving around   Mobility: Walking and Moving Around Current Status [phone]) At least 20 percent but less than 40 percent impaired, limited or restricted   Mobility: Walking and Moving Around Goal Status [phone]) At least 20 percent but less than 40 percent impaired, limited or restricted   Mobility: Walking and Moving Around Discharge Status [phone]) At least 20 percent but less than 40 percent impaired, limited or restricted       Problem List Patient Active Problem List   Diagnosis Date Noted  . Aortic stenosis, severe 2015/04/03  . Severe aortic stenosis 02/04/2015  . Hyperlipidemia 02/04/2015  . Abdominal pain, left lower quadrant 05/23/2014  . CVA (cerebral infarction) 02/19/2014  . Aortic stenosis 02/01/2014  . Hypertrophic obstructive cardiomyopathy 02/01/2014  . Aphasia 01/21/2014  . Spell of change in speech 01/21/2014  . Heart murmur 01/21/2014  . Cough 12/28/2010  . History of prostate cancer 10/08/2010  . ERECTILE DYSFUNCTION 05/24/2007  . CATARACTS, BILATERAL 05/24/2007  . HEARING LOSS 05/24/2007  . Essential hypertension 05/24/2007  . DEGENERATIVE JOINT DISEASE, SPINE 05/24/2007     Romualdo Bolk, PT 2015-04-03, 2:57 PM  [facility] [address] [place], [place], [address] Phone: [phone]   Fax:  [phone]

## 2015-03-07 ENCOUNTER — Ambulatory Visit (HOSPITAL_COMMUNITY): Admission: RE | Admit: 2015-03-07 | Payer: Medicare Other | Source: Ambulatory Visit

## 2015-03-07 ENCOUNTER — Ambulatory Visit (HOSPITAL_COMMUNITY)
Admission: RE | Admit: 2015-03-07 | Discharge: 2015-03-07 | Disposition: A | Discharge status: Home / Self Care | Payer: Medicare Other | Source: Ambulatory Visit | Attending: Cardiology | Admitting: Cardiology

## 2015-03-07 ENCOUNTER — Encounter (HOSPITAL_COMMUNITY)
Admission: RE | Disposition: A | Discharge status: Home / Self Care | Payer: Self-pay | Source: Ambulatory Visit | Attending: Cardiology

## 2015-03-07 ENCOUNTER — Encounter (HOSPITAL_COMMUNITY): Payer: Self-pay

## 2015-03-07 DIAGNOSIS — Z79899 Other long term (current) drug therapy: Secondary | ICD-10-CM | POA: Insufficient documentation

## 2015-03-07 DIAGNOSIS — I2510 Atherosclerotic heart disease of native coronary artery without angina pectoris: Secondary | ICD-10-CM | POA: Insufficient documentation

## 2015-03-07 DIAGNOSIS — I10 Essential (primary) hypertension: Secondary | ICD-10-CM | POA: Insufficient documentation

## 2015-03-07 DIAGNOSIS — I080 Rheumatic disorders of both mitral and aortic valves: Secondary | ICD-10-CM | POA: Diagnosis not present

## 2015-03-07 DIAGNOSIS — Z8546 Personal history of malignant neoplasm of prostate: Secondary | ICD-10-CM | POA: Diagnosis not present

## 2015-03-07 DIAGNOSIS — Z7902 Long term (current) use of antithrombotics/antiplatelets: Secondary | ICD-10-CM | POA: Insufficient documentation

## 2015-03-07 DIAGNOSIS — I700 Atherosclerosis of aorta: Secondary | ICD-10-CM | POA: Insufficient documentation

## 2015-03-07 DIAGNOSIS — I421 Obstructive hypertrophic cardiomyopathy: Secondary | ICD-10-CM | POA: Diagnosis not present

## 2015-03-07 DIAGNOSIS — I340 Nonrheumatic mitral (valve) insufficiency: Secondary | ICD-10-CM

## 2015-03-07 DIAGNOSIS — I350 Nonrheumatic aortic (valve) stenosis: Secondary | ICD-10-CM | POA: Diagnosis present

## 2015-03-07 DIAGNOSIS — Z8673 Personal history of transient ischemic attack (TIA), and cerebral infarction without residual deficits: Secondary | ICD-10-CM | POA: Insufficient documentation

## 2015-03-07 HISTORY — PX: TEE WITHOUT CARDIOVERSION: SHX5443

## 2015-03-07 SURGERY — ECHOCARDIOGRAM, TRANSESOPHAGEAL
Anesthesia: Moderate Sedation

## 2015-03-07 MED ORDER — MIDAZOLAM HCL 5 MG/ML IJ SOLN
INTRAMUSCULAR | Status: AC
  Filled 2015-03-07: qty 2

## 2015-03-07 MED ORDER — FENTANYL CITRATE (PF) 100 MCG/2ML IJ SOLN
INTRAMUSCULAR | Status: DC | PRN
  Administered 2015-03-07 (×2): 25 ug via INTRAVENOUS

## 2015-03-07 MED ORDER — SODIUM CHLORIDE 0.9 % IV SOLN
INTRAVENOUS | Status: DC
  Administered 2015-03-07: 09:00:00 via INTRAVENOUS

## 2015-03-07 MED ORDER — BUTAMBEN-TETRACAINE-BENZOCAINE 2-2-14 % EX AERO
INHALATION_SPRAY | CUTANEOUS | Status: DC | PRN
  Administered 2015-03-07: 2 via TOPICAL

## 2015-03-07 MED ORDER — FENTANYL CITRATE (PF) 100 MCG/2ML IJ SOLN
INTRAMUSCULAR | Status: AC
  Filled 2015-03-07: qty 2

## 2015-03-07 MED ORDER — DIPHENHYDRAMINE HCL 50 MG/ML IJ SOLN: INTRAMUSCULAR | Status: DC | PRN

## 2015-03-07 MED ORDER — MIDAZOLAM HCL 10 MG/2ML IJ SOLN
INTRAMUSCULAR | Status: DC | PRN
  Administered 2015-03-07: 1 mg via INTRAVENOUS
  Administered 2015-03-07 (×2): 2 mg via INTRAVENOUS
  Administered 2015-03-07: 1 mg via INTRAVENOUS

## 2015-03-07 NOTE — Discharge Instructions (Signed)
Transesophageal Echocardiogram °Transesophageal echocardiography (TEE) is a picture test of your heart using sound waves. The pictures taken can give very detailed pictures of your heart. This can help your doctor see if there are problems with your heart. TEE can check: °· If your heart has blood clots in it. °· How well your heart valves are working. °· If you have an infection on the inside of your heart. °· Some of the major arteries of your heart. °· If your heart valve is working after a repair. °· Your heart before a procedure that uses a shock to your heart to get the rhythm back to normal. °BEFORE THE PROCEDURE °· Do not eat or drink for 6 hours before the procedure or as told by your doctor. °· Make plans to have someone drive you home after the procedure. Do not drive yourself home. °· An IV tube will be put in your arm. °PROCEDURE °· You will be given a medicine to help you relax (sedative). It will be given through the IV tube. °· A numbing medicine will be sprayed or gargled in the back of your throat to help numb it. °· The tip of the probe is placed into the back of your mouth. You will be asked to swallow. This helps to pass the probe into your esophagus. °· Once the tip of the probe is in the right place, your doctor can take pictures of your heart. °· You may feel pressure at the back of your throat. °AFTER THE PROCEDURE °· You will be taken to a recovery area so the sedative can wear off. °· Your throat may be sore and scratchy. This will go away slowly over time. °· You will go home when you are fully awake and able to swallow liquids. °· You should have someone stay with you for the next 24 hours. °· Do not drive or operate machinery for the next 24 hours. °Document Released: 05/30/2009 Document Revised: 08/07/2013 Document Reviewed: 02/01/2013 °[employer]® Patient Information ©2015 [employer]. This information is not intended to replace advice given to you by your health care provider. Make  sure you discuss any questions you have with your health care provider. ° °

## 2015-03-07 NOTE — H&P (View-Only) (Signed)
[facility]    CARDIOTHORACIC SURGERY CONSULTATION REPORT  Referring Provider is Dorothy Spark, MD PCP is Joycelyn Man, MD  Chief Complaint  Patient presents with  . Aortic Stenosis    SEVERE...2nd TAVR CONSULT    HPI:  Patient is an 81 year old gentleman with long-standing history of aortic stenosis, hypertension and hypertrophic obstructive cardiomyopathy who has been referred for a second surgical opinion to discuss treatment options for management of severe symptomatic aortic stenosis. The patient was noted to have a heart murmur and diagnosed with hypertrophic obstructive cardiomyopathy with an 20 years ago. He was told to avoid exercising in hot weather and to make certain never to allow him self to become dehydrated. He did quite well and remain physically active until a proximally 1 year ago when he suffered a brief TIA. Symptoms at the time were manifest as a 5 minute episode of aphasia which resolved. An echocardiogram was performed demonstrating the presence of moderate to severe aortic stenosis with normal left ventricular systolic function. He was referred to Dr. Meda Coffee for formal cardiology evaluation. Repeat echocardiogram performed 08/05/2014 confirmed the presence of severe aortic stenosis with peak velocity across the valve measuring 4 m/s corresponding to mean transvalvular gradient of 34 mmHg.  The patient also was noted to have dynamic left obstruction during Valsalva maneuver with peak velocity increasing to 4.5 m/s. There was severe left ventricular hypertrophy.  The patient was referred to Dr. Burt Knack and underwent diagnostic cardiac catheterization on 02/20/2015. He was found to have mild nonobstructive coronary artery disease with normal right heart hemodynamics. Attempts to cross the aortic valve to directly measure transvalvular and left ventricular outflow tract gradients were unsuccessful.  CT angiography  has been performed to evaluate the potential feasibility of transcatheter aortic valve replacement.  The patient was referred for surgical consultation and has been seen previously by Dr. Cyndia Bent who discussed treatment options including both conventional surgical aortic valve replacement with or without septal myomectomy and transcatheter aortic valve replacement. The patient has been referred for a second surgical consultation.  The patient is married and lives locally in [place] with his wife. He has been retired for 24 years having previously worked in [employer]. He has been physically active all of his life. He played tennis up until several years ago when he had to undergo shoulder replacement surgery. He still goes to the [facility] 3 times a week and exercises on a regular basis. Both he and his wife note that over the past several months to a year the patient has experienced progressive exertional fatigue and exertional shortness of breath. He now gets short of breath pushing the trashcan to the curb. He does not get short of breath with ordinary activities around the house and he has never had any symptoms of resting shortness of breath, chest discomfort, PND, orthopnea, or lower extremity edema. He states that he has had a few episodes of severe dizziness and near syncope over the years, but none recently.  He states that recently his mobility has decreased somewhat because of pain in both hips related to chronic arthritis and slightly unstable balance. He recently purchased a cane to assist with walking.    Past Medical History  Diagnosis Date  . HYPERTENSION 05/24/2007    Qualifier: Diagnosis of  By: Scherrie Gerlach    . DEGENERATIVE JOINT DISEASE, SPINE 05/24/2007    Qualifier: Diagnosis of  By: Scherrie Gerlach    .  ERECTILE DYSFUNCTION 05/24/2007    Qualifier: Diagnosis of  By: Scherrie Gerlach    . HEARING LOSS 05/24/2007    Qualifier: Diagnosis of  By: Scherrie Gerlach    . TIA  (transient ischemic attack) 01/21/2014    5 min spell aphasia without assoc sx    . CATARACTS, BILATERAL 05/24/2007    Qualifier: Diagnosis of  By: Scherrie Gerlach    . Heart murmur 01/21/2014    undefined apparently present for years    . History of prostate cancer 10/08/2010  . Aphasia 01/21/2014    see above no slurring just couldnt get words out.   . Sleep apnea   . OA (osteoarthritis)     both hips  . Squamous cell skin cancer     left lower leg  . Severe aortic stenosis 02/04/2015  . Hypertrophic obstructive cardiomyopathy 02/01/2014  . CVA (cerebral infarction) 02/19/2014    Small infarct high posterior left frontal lobe on MRI     Past Surgical History  Procedure Laterality Date  . Prostate surgery    . Joint replacement    . Hemiarthroplasty shoulder fracture    . Cataract extraction      right 2004, left 2010  . Cardiac catheterization N/A 02/20/2015    Procedure: Right/Left Heart Cath and Coronary Angiography;  Surgeon: Sherren Mocha, MD;  Location: [facility];  Service: Cardiovascular;  Laterality: N/A;    Family History  Problem Relation Age of Onset  . Heart disease Mother   . Cancer Father   . Cancer Sister   . Prostate cancer Brother   . Heart disease Brother   . Lung cancer Brother   . CVA Mother   . CVA Father     History   Social History  . Marital Status: Married    Spouse Name: N/A  . Number of Children: N/A  . Years of Education: N/A   Occupational History  . Not on file.   Social History Main Topics  . Smoking status: Never Smoker   . Smokeless tobacco: Not on file  . Alcohol Use: No  . Drug Use: No  . Sexual Activity: No   Other Topics Concern  . Not on file   Social History Narrative    Current Outpatient Prescriptions  Medication Sig Dispense Refill  . clopidogrel (PLAVIX) 75 MG tablet Take 75 mg by mouth every morning.    . docusate sodium (COLACE) 100 MG capsule Take 200 mg by mouth every morning.     . metoprolol tartrate  (LOPRESSOR) 25 MG tablet Take 12.5 mg by mouth 3 (three) times a week. Monday , Wednesday , Friday    . pravastatin (PRAVACHOL) 10 MG tablet Take 10 mg by mouth at bedtime.    . tamsulosin (FLOMAX) 0.4 MG CAPS capsule Take 0.4 mg by mouth 3 (three) times a week. Monday Wednesday Fridday     No current facility-administered medications for this visit.    No Known Allergies    Review of Systems:   General:  normal appetite, decreased energy, no weight gain, no weight loss, no fever  Cardiac:  no chest pain with exertion, no chest pain at rest, +SOB with exertion, no resting SOB, no PND, no orthopnea, no palpitations, no arrhythmia, no atrial fibrillation, no LE edema, + dizzy spells, no syncope  Respiratory:  + exertional shortness of breath, no home oxygen, no productive cough, no dry cough, no bronchitis, no wheezing, no hemoptysis, no asthma,  no pain with inspiration or cough, no sleep apnea, no CPAP at night  GI:   no difficulty swallowing, no reflux, no frequent heartburn, no hiatal hernia, no abdominal pain, no constipation, no diarrhea, no hematochezia, no hematemesis, no melena  GU:   no dysuria,  + frequency, no urinary tract infection, no hematuria, no enlarged prostate, no kidney stones, no kidney disease  Vascular:  no pain suggestive of claudication, no pain in feet, no leg cramps, no varicose veins, no DVT, no non-healing foot ulcer  Neuro:   + stroke, + TIA's, no seizures, no headaches, no temporary blindness one eye,  no slurred speech, no peripheral neuropathy, no chronic pain, mild instability of gait, no memory/cognitive dysfunction  Musculoskeletal: + arthritis, no joint swelling, no myalgias, mild difficulty walking, mildly reduced mobility   Skin:   no rash, no itching, no skin infections, no pressure sores or ulcerations  Psych:   no anxiety, no depression, no nervousness, no unusual recent stress  Eyes:   no blurry vision, no floaters, no recent vision changes, + wears  glasses or contacts  ENT:   + hearing loss, no loose or painful teeth, no dentures, last saw dentist June 2016  Hematologic:  + easy bruising, no abnormal bleeding, no clotting disorder, no frequent epistaxis  Endocrine:  no diabetes, does not check CBG's at home           Physical Exam:   BP 124/70 mmHg  Pulse 54  Resp 18  Ht 5\' 9"  (1.753 m)  Wt 210 lb (95.255 kg)  BMI 31.00 kg/m2  SpO2 98%  General:    well-appearing - appears younger than stated age  81:  Unremarkable   Neck:   no JVD, no bruits, no adenopathy   Chest:   clear to auscultation, symmetrical breath sounds, no wheezes, no rhonchi   CV:   RRR, grade III/VI crescendo/decrescendo murmur heard best at ,  no diastolic murmur  Abdomen:  soft, non-tender, no masses   Extremities:  warm, well-perfused, pulses palpable, no LE edema  Rectal/GU  Deferred  Neuro:   Grossly non-focal and symmetrical throughout  Skin:   Clean and dry, no rashes, no breakdown   Diagnostic Tests:  Transthoracic Echocardiography  Patient:  [place], [place] MR #:    [id number] Study Date: 08/05/2014 Gender:   M Age:    48 Height:   177.8 cm Weight:   97.5 kg BSA:    2.22 m^2 Pt. Status: Room:  ATTENDING  Jenkins Rouge, M.D. SONOGRAPHER Victorio Palm, RDCS ORDERING   Ena Dawley, M.D. REFERRING  Ena Dawley, M.D. PERFORMING  [facility], Outpatient  cc:  ------------------------------------------------------------------- LV EF: 55% -  60%  ------------------------------------------------------------------- Indications:   HOCM (I42.1). Aortic stenosis. (I35.0).  ------------------------------------------------------------------- History:  PMH: Acquired from the patient and from the patient&'s chart. 5/6 Holosystolic murmur. Hypertrophic cardiomyopathy, with an ejection fraction of 70%by echocardiography. Outflow obstruction occurs with provocation. Borderline significant aortic  stenosis. Risk factors: Hypertension.  ------------------------------------------------------------------- Study Conclusions  - Left ventricle: The cavity size was normal. Wall thickness was increased in a pattern of severe LVH. Systolic function was normal. The estimated ejection fraction was in the range of 55% to 60%. There was dynamic obstruction during Valsalvain the outflow tract, with mid-cavity obliteration, a peak velocity of 448 cm/sec, and a peak gradient of 80 mm Hg. Doppler parameters are consistent with abnormal left ventricular relaxation (grade 1 diastolic dysfunction). - Aortic valve: Likely trileaflet with fused right and left cusps.  There was severe stenosis. - Left atrium: The atrium was moderately dilated. - Atrial septum: No defect or patent foramen ovale was identified. - Impressions: No LVOT gradient or SAM appreciated Primary issues appears to be fixed valvular aortic stenosis  Impressions:  - No LVOT gradient or SAM appreciated Primary issues appears to be fixed valvular aortic stenosis  ------------------------------------------------------------------- Labs, prior tests, procedures, and surgery: Echocardiography (June 2015).  The study demonstrated systemic outflow obstruction. EF was 70%. Severe concentric LVH. LVOT obstruction increase from 87mmHG to 150 mmHG with Valsalva.  Transthoracic echocardiography. M-mode, complete 2D, spectral Doppler, and color Doppler. Birthdate: Patient birthdate: December 02, 1923. Age: Patient is 81 yr old. Sex: Gender: male. BMI: 30.8 kg/m^2. Blood pressure:   130/75 Patient status: Outpatient. Study date: Study date: 08/05/2014. Study time: 02:02 PM. Location: [facility]  -------------------------------------------------------------------  ------------------------------------------------------------------- Left ventricle: The cavity size was normal. Wall thickness  was increased in a pattern of severe LVH. Systolic function was normal. The estimated ejection fraction was in the range of 55% to 60%. There was dynamic obstruction during Valsalvain the outflow tract, with mid-cavity obliteration, a peak velocity of 448 cm/sec, and a peak gradient of 80 mm Hg. Doppler parameters are consistent with abnormal left ventricular relaxation (grade 1 diastolic dysfunction).  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 Severely calcified leaflets. Doppler:  There was severe stenosis.   Mean gradient (S): 34 mm Hg. Peak gradient (S): 66 mm Hg.  ------------------------------------------------------------------- Aorta: The aorta was normal, not dilated, and non-diseased.  ------------------------------------------------------------------- Mitral valve:  Mildly thickened leaflets . Doppler: There was trivial regurgitation.  Peak gradient (D): 2 mm Hg.  ------------------------------------------------------------------- Left atrium: The atrium was moderately dilated.  ------------------------------------------------------------------- Atrial septum: No defect or patent foramen ovale was identified.  ------------------------------------------------------------------- Right ventricle: The cavity size was normal. Wall thickness was normal. Systolic function was normal.  ------------------------------------------------------------------- Pulmonic valve:  Structurally normal valve.  Cusp separation was normal. Doppler: Transvalvular velocity was within the normal range. There was trivial regurgitation.  ------------------------------------------------------------------- Tricuspid valve:  Structurally normal valve.  Leaflet separation was normal. Doppler: Transvalvular velocity was within the normal range. There was mild  regurgitation.  ------------------------------------------------------------------- Right atrium: The atrium was normal in size.  ------------------------------------------------------------------- Pericardium: The pericardium was normal in appearance.  ------------------------------------------------------------------- Post procedure conclusions Ascending Aorta:  - The aorta was normal, not dilated, and non-diseased.  ------------------------------------------------------------------- Measurements  Left ventricle             Value    Reference LV ID, ED, PLAX chordal        45  mm   43 - 52 LV ID, ES, PLAX chordal    (L)   22  mm   23 - 38 LV fx shortening, PLAX chordal     51  %   >=29 LV PW thickness, ED          15  mm   --------- IVS/LV PW ratio, ED      (H)   1.4     <=1.3 LV e&', lateral             4.35 cm/s  --------- LV E/e&', lateral            16.39    --------- LV e&', medial             3.7  cm/s  --------- LV E/e&', medial            19.27    ---------  LV e&', average             4.03 cm/s  --------- LV E/e&', average            17.71    ---------  Ventricular septum           Value    Reference IVS thickness, ED           21  mm   ---------  LVOT                  Value    Reference LVOT ID, S               20  mm   --------- LVOT area               3.14 cm^2  ---------  Aortic valve              Value    Reference Aortic valve peak velocity, S     406  cm/s  --------- Aortic valve mean velocity, S     275  cm/s  --------- Aortic valve VTI, S          97.8 cm   --------- Aortic mean gradient, S        34  mm Hg --------- Aortic peak  gradient, S        66  mm Hg ---------  Aorta                 Value    Reference Aortic root ID, ED           35  mm   ---------  Left atrium              Value    Reference LA ID, A-P, ES             47  mm   --------- LA ID/bsa, A-P             2.12 cm/m^2 <=2.2 LA volume, S              104  ml   --------- LA volume/bsa, S            46.8 ml/m^2 --------- LA volume, ES, 1-p A4C         105  ml   --------- LA volume/bsa, ES, 1-p A4C       47.3 ml/m^2 --------- LA volume, ES, 1-p A2C         104  ml   --------- LA volume/bsa, ES, 1-p A2C       46.8 ml/m^2 ---------  Mitral valve              Value    Reference Mitral E-wave peak velocity      71.3 cm/s  --------- Mitral deceleration time    (H)   370  ms   150 - 230 Mitral peak gradient, D        2   mm Hg --------- Mitral E/A ratio, peak         0.6     ---------  Pulmonary arteries           Value    Reference PA pressure, S, DP           27  mm Hg <=30  Tricuspid valve            Value    Reference Tricuspid regurg peak velocity  247  cm/s  --------- Tricuspid peak RV-RA gradient     24  mm Hg ---------  Systemic veins             Value    Reference Estimated CVP             3   mm Hg ---------  Right ventricle            Value    Reference RV pressure, S, DP           27  mm Hg <=30 RV s&', lateral, S           12.2 cm/s  ---------  Legend: (L) and (H) mark values outside specified reference range.  ------------------------------------------------------------------- Prepared and Electronically Authenticated by  Jenkins Rouge,  M.D. 2015-12-21T15:25:44    CARDIAC CATHETERIZATION  Conclusion    FINAL CONCLUSIONS:  Minor nonobstructive coronary artery disease  Known severe aortic stenosis with a heavily calcified aortic valve and restricted aortic valve leaflet motion on plain fluoroscopy  Normal right heart hemodynamics  RECOMMENDATIONS:  Cardiac surgical evaluation by Dr Cyndia Bent for consideration of aortic valve replacement/TAVR    Coronary Findings    Dominance: Right   Left Anterior Descending  The vessel exhibits minimal luminal irregularities.   . Prox LAD lesion, 30% stenosed. calcified .     Left Circumflex  The vessel exhibits minimal luminal irregularities.     Right Coronary Artery  The vessel exhibits minimal luminal irregularities.      Left Heart    Aortic Valve The aortic valve on plain fluoroscopy has bulky severe leaflet calcification with restricted mobility.    Coronary Diagrams    Diagnostic Diagram            Implants    Name ID Temporary Type Supply   No information to display    Hemo Data       Most Recent Value   Fick Cardiac Output  4.48 L/min   Fick Cardiac Output Index  2.1 (L/min)/BSA   RA A Wave  4 mmHg   RA V Wave  5 mmHg   RA Mean  2 mmHg   RV Systolic Pressure  27 mmHg   RV Diastolic Pressure  -1 mmHg   RV EDP  4 mmHg   PA Systolic Pressure  27 mmHg   PA Diastolic Pressure  7 mmHg   PA Mean  15 mmHg   PW A Wave  8 mmHg   PW V Wave  6 mmHg   PW Mean  5 mmHg   AO Systolic Pressure  938 mmHg   AO Diastolic Pressure  49 mmHg   AO Mean  70 mmHg   QP/QS  1   TPVR Index  7.14 HRUI   TSVR Index  33.29 HRUI   PVR SVR Ratio  0.15   TPVR/TSVR Ratio  0.21     Cardiac TAVR CT  TECHNIQUE: The patient was scanned on a Philips 256 scanner. A 120 kV retrospective scan was triggered in the descending thoracic aorta at 111 HU's. Gantry rotation speed was 270 msecs and collimation was .9 mm. No beta blockade or  nitro were given. The 3D data set was reconstructed in 5% intervals of the R-R cycle. Systolic and diastolic phases were analyzed on a dedicated work station using MPR, MIP and VRT modes. The patient received 80 cc of contrast.  FINDINGS: Aortic Valve: Severely calcified trileaflet valve. No significant calcification of the STJ or annulus  Aorta: Tortuous descending thoracic aorta with mild calcification of the inferior surface of the arch and mild ascending aortic root dilatation  Sinotubular Junction: 31 mm  Ascending Thoracic Aorta: 36 mm  Aortic Arch: 33 mm  Descending Thoracic Aorta: 26 mm  Sinus of Valsalva Measurements:  Non-coronary: 36 mm  Right -coronary: 35 mm  Left -coronary: 36 mm  Coronary Artery Height above Annulus:  Left Main: 15 mm  Right Coronary: 17 mm  Virtual Basal Annulus Measurements:  Maximum/Minimum Diameter: 32.2 mm x 25.4 mm  Perimeter: 90.63 mm  Area: 618 mm2  Coronary Arteries: Sufficient height above annulus for deployment and no anomalous origin see cardiac cath report  Optimum Fluoroscopic Angle for Delivery: LAO 12 degrees and Cranial 1 degree  IMPRESSION: 1) Severely calcified tri leaflet aortic valve suitable for a 29 mm S[place] 3 vlave Annular Area 618 mm2  2) Tortuous descending thoracic aorta with mild aortic root dilatation no contraindications to transfemoral delivery  3) Suitable coronary ostia height for deployment  4) No LAA thrombus  5) Optimum Angiographic Angle for Delivery LAO 12 degrees Cranial 1 Degree  Jenkins Rouge   Electronically Signed  By: Jenkins Rouge M.D.  On: 02/27/2015 10:49        CT ANGIOGRAPHY CHEST, ABDOMEN AND PELVIS  TECHNIQUE: Multidetector CT imaging through the chest, abdomen and pelvis was performed using the standard protocol during bolus administration of intravenous contrast. Multiplanar reconstructed images and MIPs  were obtained and reviewed to evaluate the vascular anatomy.  CONTRAST: 70 mL OMNIPAQUE IOHEXOL 350 MG/ML SOLN  COMPARISON: CT the abdomen and pelvis 02/25/2005.  FINDINGS: CTA CHEST FINDINGS  Mediastinum/Lymph Nodes: Heart size is enlarged. There is no significant pericardial fluid, thickening or pericardial calcification. There is atherosclerosis of the thoracic aorta, the great vessels of the mediastinum and the coronary arteries, including calcified atherosclerotic plaque in the left main, left anterior descending, left circumflex and right coronary arteries. Severe calcifications of the aortic valve. No pathologically enlarged mediastinal or hilar lymph nodes. Esophagus is unremarkable in appearance. No axillary lymphadenopathy.  Lungs/Pleura: No acute consolidative airspace disease. No pleural effusions. No suspicious appearing pulmonary nodules or masses. Areas of mild scarring and/or subsegmental atelectasis are noted throughout the dependent portions of the lower lobes of the lungs bilaterally.  Musculoskeletal/Soft Tissues: Status post right shoulder arthroplasty. There are no aggressive appearing lytic or blastic lesions noted in the visualized portions of the skeleton.  CTA ABDOMEN AND PELVIS FINDINGS  Hepatobiliary: No cystic or solid hepatic lesions. No intra or extrahepatic biliary ductal dilatation. Gallbladder is normal in appearance.  Pancreas: No pancreatic mass. No pancreatic ductal dilatation. No pancreatic or peripancreatic fluid or inflammatory changes.  Spleen: Unremarkable.  Adrenals/Urinary Tract: Bilateral adrenal glands are normal in appearance. Several sub cm low-attenuation lesions are noted in the kidneys bilaterally, too small to definitively characterize, but favored to represent small cysts. In addition, there is a well-defined 2.9 cm low-attenuation nonenhancing lesion in the lower pole of the left kidney, compatible with a  simple cyst. No hydroureteronephrosis. Urinary bladder is normal in appearance.  Stomach/Bowel: Normal appearance of the stomach. No pathologic dilatation of small bowel or colon. Numerous colonic diverticulae are noted, particularly in the descending and sigmoid colon, without surrounding inflammatory changes to suggest an acute diverticulitis at this time. Normal appendix.  Vascular/Lymphatic: Vascular findings and measurements pertinent to potential TVR procedure, as detailed below. No lymphadenopathy noted in the abdomen or pelvis.  Reproductive: Fiducial markers in the prostate gland, presumably  related to prior radiation therapy. Prostate gland and seminal vesicles are otherwise unremarkable in appearance.  Other: No significant volume of ascites. No pneumoperitoneum.  Musculoskeletal: There are no aggressive appearing lytic or blastic lesions noted in the visualized portions of the skeleton.  VASCULAR MEASUREMENTS PERTINENT TO TAVR:  AORTA:  Minimal Aortic Diameter - 13 x 15 mm  Severity of Aortic Calcification - mild  RIGHT PELVIS:  Right Common Iliac Artery -  Minimal Diameter - 10.1 x 9.3 mm  Tortuosity - mild  Calcification - mild  Right External Iliac Artery -  Minimal Diameter - 11.0 x 9.8 mm  Tortuosity - severe  Calcification - none  Right Common Femoral Artery -  Minimal Diameter - 10.7 x 9.2 mm  Tortuosity - mild  Calcification - none  LEFT PELVIS:  Left Common Iliac Artery -  Minimal Diameter - 9.4 x 7.8 mm  Tortuosity - mild  Calcification - mild  Left External Iliac Artery -  Minimal Diameter - 9.2 x 10.6 mm  Tortuosity - severe  Calcification - none  Left Common Femoral Artery -  Minimal Diameter - 10.4 x 11.0 mm  Tortuosity - mild  Calcification - mild  Review of the MIP images confirms the above findings.  IMPRESSION: 1. Vascular findings and measurements pertinent to  potential TVR procedure, as detailed above. This patient does appear to have suitable pelvic arterial access. Please be aware, however, that there is extensive tortuosity of the external iliac arteries bilaterally. 2. Extensive calcifications of the aortic valve, compatible with the reported clinical history of severe aortic stenosis. 3. Colonic diverticulosis without evidence of acute diverticulitis at this time. 4. Additional incidental findings, as above.   Electronically Signed  By: Vinnie Langton M.D.  On: 02/27/2015 11:24   Results for RISHAAN, GUNNER (MRN [id number]) as of 03/05/2015 14:26  Ref. Range 02/27/2015 09:30  FVC-Pre Latest Units: L 3.35  FVC-%Pred-Pre Latest Units: % 96  FEV1-Pre Latest Units: L 2.59  FEV1-%Pred-Pre Latest Units: % 109  Pre FEV1/FVC ratio Latest Units: % 77  FEV1FVC-%Pred-Pre Latest Units: % 110  FEF 25-75 Pre Latest Units: L/sec 2.21  FEF2575-%Pred-Pre Latest Units: % 152  FEV6-Pre Latest Units: L 3.35  FEV6-%Pred-Pre Latest Units: % 104  Pre FEV6/FVC Ratio Latest Units: % 100  FEV6FVC-%Pred-Pre Latest Units: % 108  FVC-Post Latest Units: L 3.45  FVC-%Pred-Post Latest Units: % 99  FVC-%Change-Post Latest Units: % 2  FEV1-Post Latest Units: L 2.82  FEV1-%Pred-Post Latest Units: % 118  FEV1-%Change-Post Latest Units: % 8  Post FEV1/FVC ratio Latest Units: % 82  FEV1FVC-%Change-Post Latest Units: % 5  FEF 25-75 Post Latest Units: L/sec 3.24  FEF2575-%Pred-Post Latest Units: % 223  FEF2575-%Change-Post Latest Units: % 46  FEV6-Post Latest Units: L 3.45  FEV6-%Pred-Post Latest Units: % 107  FEV6-%Change-Post Latest Units: % 2  Post FEV6/FVC ratio Latest Units: % 100  FEV6FVC-%Pred-Post Latest Units: % 108  TLC Latest Units: L 7.66  TLC % pred Latest Units: % 111  RV Latest Units: L 4.12  RV % pred Latest Units: % 148  DLCO unc Latest Units: ml/min/mmHg 28.52  DLCO unc % pred Latest Units: % 92  DL/VA Latest Units: ml/min/mmHg/L  4.49  DL/VA % pred Latest Units: % 99     STS Risk Calculator  Procedure    AVR  Risk of Mortality   2.8% Morbidity or Mortality  19.6% Prolonged LOS   9.3% Short LOS    23.9% Permanent Stroke  3.1% Prolonged Vent Support  11.4% DSW Infection    0.3% Renal Failure    6.0% Reoperation    8.3%    Impression:  Patient has stage D severe symptomatic aortic stenosis with normal left ventricular systolic function but severe left ventricular hypertrophy and 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  I have personally reviewed the patient's most recent transthoracic echocardiogram, cardiac catheterization and CT angiograms. There is moderate to severe thickening and calcification involving all 3 leaflets of the patient's aortic valve. There is restricted leaflet mobility and peak velocity across the aortic valve measured 4 m/s. The patient does have severe left ventricular hypertrophy with a very prominent septal bulge.  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  The patient did not have systolic anterior motion of the mitral valve.  Diagnostic cardiac catheterization is notable for the absence of significant coronary artery disease. Direct measurement of pressure gradients across the aortic valve and left ventricular output tract could not be obtained at the time of catheterization.  Risks associated with conventional surgical aortic valve replacement with or without septal myomectomy would be at least moderately elevated because of the patient's advanced age and history of cerebrovascular disease.   Cardiac gated CT angiogram of the heart confirmed the presence of severe aortic  stenosis with anatomical findings suitable for transcatheter aortic valve replacement using a 29 mm Edwards Sapien 3 transcatheter heart valve. There are no significant complicating features with exception of the presence of significant septal hypertrophy and narrowing of the left ventricular outflow tract.  It should be noted that the cross-sectional area in the left ventricular outflow tract is substantially smaller than the cross-sectional area at the level of the aortic valve annulus.  CT angiogram of the abdomen and pelvis demonstrates that the patient has adequate pelvic vascular access for transfemoral approach for TAVR.   Plan:  The patient and his wife were counseled at length regarding treatment alternatives for management of severe symptomatic aortic stenosis and hypertrophic obstructive cardiomyopathy.  Alternative approaches such as conventional aortic valve replacement with or without septal myomectomy, transcatheter aortic valve replacement, and palliative medical therapy were compared and contrasted at length.  The risks associated with conventional surgical aortic valve replacement with septal myomectomy were been discussed in detail, as were expectations for post-operative convalescence. This discussion was placed in the context of the patient's own specific clinical presentation and past medical history.  The implications of the presence of significant dynamic left ventricular outflow tract obstruction and hypertrophic obstructive cardiomyopathy have been discussed as well.  Long-term prognosis with medical therapy was discussed.  I favor proceeding with transesophageal echocardiogram to further evaluate the presence and severity of septal hypertrophy and hypertrophic obstructive cardiomyopathy. If TEE confirms findings consistent with significant subvalvular obstruction I tend to favor conventional surgery which offers the ability to definitively treat the left ventricular outflow tract  obstruction and avoid any potential complications related to malposition of a transcatheter heart valve.  The patient will schedule transesophageal echocardiogram as soon as practical with Dr. Meda Coffee. Findings will be discussed at length by our multidisciplinary heart valve team before making final recommendations.  I spent in excess of 90 minutes during the conduct of this office consultation and >50% of this time involved direct face-to-face encounter with the patient  for counseling and/or coordination of their care.    Valentina Gu. Roxy Manns, MD 03/05/2015 1:20 PM

## 2015-03-07 NOTE — Interval H&P Note (Signed)
History and Physical Interval Note:  03/07/2015 8:21 AM  Robert Anderson  has presented today for surgery, with the diagnosis of SEVERE AORTIC STENOSIS  The various methods of treatment have been discussed with the patient and family. After consideration of risks, benefits and other options for treatment, the patient has consented to  Procedure(s): TRANSESOPHAGEAL ECHOCARDIOGRAM (TEE) (N/A) as a surgical intervention .  The patient's history has been reviewed, patient examined, no change in status, stable for surgery.  I have reviewed the patient's chart and labs.  Questions were answered to the patient's satisfaction.     Dorothy Spark

## 2015-03-07 NOTE — CV Procedure (Signed)
     Transesophageal Echocardiogram Note  Robert Anderson [id number] June 15, 1924  Procedure: Transesophageal Echocardiogram Indications: Aortic stenosis, HCM  Procedure Details Consent: Obtained Time Out: Verified patient identification, verified procedure, site/side was marked, verified correct patient position, special equipment/implants available, Radiology Safety Procedures followed,  medications/allergies/relevent history reviewed, required imaging and test results available.  Performed  Medications: Fentanyl: 50 mcg Versed: 6 mg   Please see TEE report  Complications: No apparent complications Patient did tolerate procedure well.  Dorothy Spark, MD, [facility] 03/07/2015, 8:21 AM

## 2015-03-11 ENCOUNTER — Encounter (HOSPITAL_COMMUNITY): Payer: Self-pay | Admitting: Cardiology

## 2015-03-11 ENCOUNTER — Ambulatory Visit (HOSPITAL_COMMUNITY): Payer: Medicare Other

## 2015-03-12 ENCOUNTER — Ambulatory Visit (HOSPITAL_COMMUNITY)
Admission: RE | Admit: 2015-03-12 | Discharge: 2015-03-12 | Disposition: A | Discharge status: Home / Self Care | Payer: Medicare Other | Source: Ambulatory Visit | Attending: Cardiology | Admitting: Cardiology

## 2015-03-12 DIAGNOSIS — R011 Cardiac murmur, unspecified: Secondary | ICD-10-CM | POA: Insufficient documentation

## 2015-03-12 DIAGNOSIS — I081 Rheumatic disorders of both mitral and tricuspid valves: Secondary | ICD-10-CM | POA: Insufficient documentation

## 2015-03-13 ENCOUNTER — Telehealth: Payer: Self-pay | Admitting: *Deleted

## 2015-03-13 ENCOUNTER — Encounter: Payer: Self-pay | Admitting: Cardiovascular Disease

## 2015-03-13 ENCOUNTER — Other Ambulatory Visit: Payer: Self-pay | Admitting: *Deleted

## 2015-03-13 DIAGNOSIS — I350 Nonrheumatic aortic (valve) stenosis: Secondary | ICD-10-CM

## 2015-03-13 NOTE — Telephone Encounter (Signed)
Mr. Getter is scheduled for TAVR-TF surgery on Tuesday 8/9 with Drs. McAlhany & Bartle.  He is to stop taking his plavix 5 days prior and to start 81mg  ASA daily.  Pre-op is on Friday 8/5 @ 10am.  He is aware of all instructions and has no further questions.  He will call me if anything comes up.

## 2015-03-13 NOTE — Progress Notes (Unsigned)
This note is to document a telephone conversation with the patient and his wife. We have had extensive review of this patient's case amongst the members of the multidisciplinary heart valve team. The patient has long-standing hypertrophic cardiomyopathy and has developed stage D symptomatic aortic valve stenosis. We have reviewed treatment options, specifically the pros and cons of TAVR versus conventional aortic valve replacement with myectomy.  After discussion with the patient and his wife, and they strongly favor TAVR as a treatment option. They understand that there are additional procedural risks that are related to his severe septal hypertrophy. They also understand that he may be left with a subvalvular gradient. Despite these risks, considering his advanced age and progressive functional decline, he would have a high-likelihood of prolonged/complicated recovery from conventional surgery. The patient will be scheduled for TAVR with Dr. Angelena Form and Dr. Cyndia Bent on August 9th which is our next available date for surgery. All questions were answered.  Sherren Mocha 03/13/2015 10:24 AM

## 2015-03-18 ENCOUNTER — Other Ambulatory Visit: Payer: Self-pay | Admitting: Cardiology

## 2015-03-21 ENCOUNTER — Encounter (HOSPITAL_COMMUNITY): Payer: Self-pay

## 2015-03-21 ENCOUNTER — Ambulatory Visit (HOSPITAL_COMMUNITY)
Admission: RE | Admit: 2015-03-21 | Discharge: 2015-03-21 | Disposition: A | Discharge status: Home / Self Care | Payer: Medicare Other | Source: Ambulatory Visit | Attending: Cardiovascular Disease | Admitting: Cardiovascular Disease

## 2015-03-21 ENCOUNTER — Encounter (HOSPITAL_COMMUNITY)
Admission: RE | Admit: 2015-03-21 | Discharge: 2015-03-21 | Disposition: A | Discharge status: Home / Self Care | Payer: Medicare Other | Source: Ambulatory Visit | Attending: Cardiovascular Disease | Admitting: Cardiovascular Disease

## 2015-03-21 VITALS — BP 138/54 | HR 56 | Temp 97.4°F | Resp 20 | Ht 69.0 in | Wt 213.9 lb

## 2015-03-21 DIAGNOSIS — J9811 Atelectasis: Secondary | ICD-10-CM | POA: Diagnosis not present

## 2015-03-21 DIAGNOSIS — Z0183 Encounter for blood typing: Secondary | ICD-10-CM | POA: Insufficient documentation

## 2015-03-21 DIAGNOSIS — R001 Bradycardia, unspecified: Secondary | ICD-10-CM | POA: Insufficient documentation

## 2015-03-21 DIAGNOSIS — I517 Cardiomegaly: Secondary | ICD-10-CM | POA: Insufficient documentation

## 2015-03-21 DIAGNOSIS — Z01818 Encounter for other preprocedural examination: Secondary | ICD-10-CM | POA: Diagnosis not present

## 2015-03-21 DIAGNOSIS — I447 Left bundle-branch block, unspecified: Secondary | ICD-10-CM | POA: Insufficient documentation

## 2015-03-21 DIAGNOSIS — I350 Nonrheumatic aortic (valve) stenosis: Secondary | ICD-10-CM

## 2015-03-21 DIAGNOSIS — Z01812 Encounter for preprocedural laboratory examination: Secondary | ICD-10-CM | POA: Insufficient documentation

## 2015-03-21 LAB — PROTIME-INR
INR: 1.09 (ref 0.00–1.49)
Prothrombin Time: 14.3 seconds (ref 11.6–15.2)

## 2015-03-21 LAB — COMPREHENSIVE METABOLIC PANEL
ALT: 16 U/L — ABNORMAL LOW (ref 17–63)
AST: 20 U/L (ref 15–41)
Albumin: 3.7 g/dL (ref 3.5–5.0)
Alkaline Phosphatase: 58 U/L (ref 38–126)
Anion gap: 8 (ref 5–15)
BUN: 19 mg/dL (ref 6–20)
CALCIUM: 9.6 mg/dL (ref 8.9–10.3)
CO2: 23 mmol/L (ref 22–32)
Chloride: 109 mmol/L (ref 101–111)
Creatinine, Ser: 0.87 mg/dL (ref 0.61–1.24)
GFR calc Af Amer: >60 mL/min (ref >60)
Glucose, Bld: 109 mg/dL — ABNORMAL HIGH (ref 65–99)
POTASSIUM: 4.7 mmol/L (ref 3.5–5.1)
Sodium: 140 mmol/L (ref 135–145)
TOTAL PROTEIN: 6.2 g/dL — AB (ref 6.5–8.1)
Total Bilirubin: 0.6 mg/dL (ref 0.3–1.2)

## 2015-03-21 LAB — URINALYSIS, ROUTINE W REFLEX MICROSCOPIC
Bilirubin Urine: NEGATIVE
Glucose, UA: NEGATIVE mg/dL
Hgb urine dipstick: NEGATIVE
Ketones, ur: NEGATIVE mg/dL
Leukocytes, UA: NEGATIVE
Nitrite: NEGATIVE
PH: 6 (ref 5.0–8.0)
Protein, ur: NEGATIVE mg/dL
Specific Gravity, Urine: 1.022 (ref 1.005–1.030)
Urobilinogen, UA: 1 mg/dL (ref 0.0–1.0)

## 2015-03-21 LAB — CBC
HCT: 44.6 % (ref 39.0–52.0)
Hemoglobin: 14.6 g/dL (ref 13.0–17.0)
MCH: 29.4 pg (ref 26.0–34.0)
MCHC: 32.7 g/dL (ref 30.0–36.0)
MCV: 89.7 fL (ref 78.0–100.0)
Platelets: 176 10*3/uL (ref 150–400)
RBC: 4.97 MIL/uL (ref 4.22–5.81)
RDW: 13.3 % (ref 11.5–15.5)
WBC: 6.9 10*3/uL (ref 4.0–10.5)

## 2015-03-21 LAB — BLOOD GAS, ARTERIAL
Acid-base deficit: 1.1 mmol/L (ref 0.0–2.0)
BICARBONATE: 22.9 meq/L (ref 20.0–24.0)
Drawn by: 206361
FIO2: 0.21
O2 Saturation: 96.9 %
Patient temperature: 98.6
TCO2: 24 mmol/L (ref 0–100)
pCO2 arterial: 37.2 mmHg (ref 35.0–45.0)
pH, Arterial: 7.407 (ref 7.350–7.450)
pO2, Arterial: 85 mmHg (ref 80.0–100.0)

## 2015-03-21 LAB — SURGICAL PCR SCREEN
MRSA, PCR: NEGATIVE
Staphylococcus aureus: NEGATIVE

## 2015-03-21 LAB — ABO/RH: ABO/RH(D): A POS

## 2015-03-21 LAB — APTT: aPTT: 28 seconds (ref 24–37)

## 2015-03-21 NOTE — Progress Notes (Signed)
   03/21/15 1029  OBSTRUCTIVE SLEEP APNEA  Have you ever been diagnosed with sleep apnea through a sleep study? No  Do you snore loudly (loud enough to be heard through closed doors)?  0  Do you often feel tired, fatigued, or sleepy during the daytime? 1 (gets sleepy in afternoon....blames it on getting up frequently to urinate)  Has anyone observed you stop breathing during your sleep? 0  Do you have, or are you being treated for high blood pressure? 1  BMI more than 35 kg/m2? 0  Age over 35 years old? 1  Neck circumference greater than 40 cm/16 inches? 1  Gender: 1

## 2015-03-21 NOTE — Progress Notes (Signed)
Was instructed to stop Plavix on 8/3 which he has done. Now taking a BASA 81mg  and will take one the day before.  Last saw Dr. Meda Coffee 1 month ago

## 2015-03-21 NOTE — Pre-Procedure Instructions (Signed)
BURCH MARCHUK  03/21/2015      [id number] - [place], [place] - [address] [place] [address] Phone: [phone] Fax: [phone]    Your procedure is scheduled on Tuesday, August 9th   Report to [facility] Admitting at 5:30 AM  Call this number if you have problems the morning of surgery:  [phone]   Remember:  Do not eat food or drink liquids after midnight Monday.  Take these medicines the morning of surgery with A SIP OF WATER : ?   Do not wear jewelry no rings or watches.  Do not wear lotions or colognes.  You may NOT wear deodorant the day of surgery.             Men may shave face and neck.   Do not bring valuables to the hospital.  [facility] is not responsible for any belongings or valuables.  Contacts, dentures or bridgework may not be worn into surgery.  Leave your suitcase in the car.  After surgery it may be brought to your room. For patients admitted to the hospital, discharge time will be determined by your treatment team.    Name and phone number of your driver:     Special instructions: "Preparing for Surgery" instruction sheet.  Please read over the following fact sheets that you were given. Pain Booklet, Coughing and Deep Breathing, Blood Transfusion Information, MRSA Information and Surgical Site Infection Prevention

## 2015-03-22 LAB — HEMOGLOBIN A1C
Hgb A1c MFr Bld: 6.1 % — ABNORMAL HIGH (ref 4.8–5.6)
MEAN PLASMA GLUCOSE: 128 mg/dL

## 2015-03-24 MED ORDER — POTASSIUM CHLORIDE 2 MEQ/ML IV SOLN
80.0000 meq | INTRAVENOUS | Status: DC
  Filled 2015-03-24: qty 40

## 2015-03-24 MED ORDER — DEXTROSE 5 % IV SOLN
1.5000 g | INTRAVENOUS | Status: AC
  Administered 2015-03-25: 1.5 g via INTRAVENOUS
  Filled 2015-03-24: qty 1.5

## 2015-03-24 MED ORDER — MAGNESIUM SULFATE 50 % IJ SOLN
40.0000 meq | INTRAMUSCULAR | Status: DC
  Filled 2015-03-24: qty 10

## 2015-03-24 MED ORDER — DEXTROSE 5 % IV SOLN
30.0000 ug/min | INTRAVENOUS | Status: DC
  Filled 2015-03-24: qty 2

## 2015-03-24 MED ORDER — NOREPINEPHRINE BITARTRATE 1 MG/ML IV SOLN
0.0000 ug/min | INTRAVENOUS | Status: DC
  Filled 2015-03-24: qty 4

## 2015-03-24 MED ORDER — SODIUM CHLORIDE 0.9 % IV SOLN
INTRAVENOUS | Status: DC
  Administered 2015-03-25: 1.2 [IU]/h via INTRAVENOUS
  Filled 2015-03-24: qty 2.5

## 2015-03-24 MED ORDER — SODIUM CHLORIDE 0.9 % IV SOLN
INTRAVENOUS | Status: DC
  Filled 2015-03-24: qty 30

## 2015-03-24 MED ORDER — DOPAMINE-DEXTROSE 3.2-5 MG/ML-% IV SOLN
0.0000 ug/kg/min | INTRAVENOUS | Status: DC
  Filled 2015-03-24: qty 250

## 2015-03-24 MED ORDER — NITROGLYCERIN IN D5W 200-5 MCG/ML-% IV SOLN
2.0000 ug/min | INTRAVENOUS | Status: DC
  Filled 2015-03-24: qty 250

## 2015-03-24 MED ORDER — EPINEPHRINE HCL 1 MG/ML IJ SOLN
0.0000 ug/min | INTRAVENOUS | Status: DC
  Filled 2015-03-24: qty 4

## 2015-03-24 MED ORDER — VANCOMYCIN HCL 10 G IV SOLR
1250.0000 mg | INTRAVENOUS | Status: AC
  Administered 2015-03-25: 1250 mg via INTRAVENOUS
  Filled 2015-03-24: qty 1250

## 2015-03-24 MED ORDER — DEXMEDETOMIDINE HCL IN NACL 400 MCG/100ML IV SOLN
0.1000 ug/kg/h | INTRAVENOUS | Status: DC
  Filled 2015-03-24: qty 100

## 2015-03-25 ENCOUNTER — Encounter (HOSPITAL_COMMUNITY)
Admission: RE | Disposition: A | Discharge status: Home / Self Care | Payer: Medicare Other | Source: Ambulatory Visit | Attending: Cardiovascular Disease

## 2015-03-25 ENCOUNTER — Inpatient Hospital Stay (HOSPITAL_COMMUNITY): Payer: Medicare Other

## 2015-03-25 ENCOUNTER — Inpatient Hospital Stay (HOSPITAL_COMMUNITY)
Admission: RE | Admit: 2015-03-25 | Discharge: 2015-03-27 | DRG: 267 | Disposition: A | Discharge status: Home / Self Care | Payer: Medicare Other | Source: Ambulatory Visit | Attending: Cardiovascular Disease | Admitting: Cardiovascular Disease

## 2015-03-25 ENCOUNTER — Inpatient Hospital Stay (HOSPITAL_COMMUNITY): Payer: Medicare Other | Admitting: Anesthesiology

## 2015-03-25 ENCOUNTER — Inpatient Hospital Stay (HOSPITAL_COMMUNITY)
Admission: RE | Admit: 2015-03-25 | Discharge: 2015-03-25 | Disposition: A | Discharge status: Home / Self Care | Payer: Medicare Other | Source: Ambulatory Visit | Attending: Cardiovascular Disease | Admitting: Cardiovascular Disease

## 2015-03-25 ENCOUNTER — Encounter (HOSPITAL_COMMUNITY): Payer: Self-pay | Admitting: *Deleted

## 2015-03-25 DIAGNOSIS — I10 Essential (primary) hypertension: Secondary | ICD-10-CM | POA: Diagnosis not present

## 2015-03-25 DIAGNOSIS — Z8249 Family history of ischemic heart disease and other diseases of the circulatory system: Secondary | ICD-10-CM

## 2015-03-25 DIAGNOSIS — Z823 Family history of stroke: Secondary | ICD-10-CM | POA: Diagnosis not present

## 2015-03-25 DIAGNOSIS — I422 Other hypertrophic cardiomyopathy: Secondary | ICD-10-CM | POA: Diagnosis not present

## 2015-03-25 DIAGNOSIS — I700 Atherosclerosis of aorta: Secondary | ICD-10-CM | POA: Diagnosis not present

## 2015-03-25 DIAGNOSIS — Z79899 Other long term (current) drug therapy: Secondary | ICD-10-CM

## 2015-03-25 DIAGNOSIS — H9190 Unspecified hearing loss, unspecified ear: Secondary | ICD-10-CM | POA: Diagnosis not present

## 2015-03-25 DIAGNOSIS — Z7902 Long term (current) use of antithrombotics/antiplatelets: Secondary | ICD-10-CM | POA: Diagnosis not present

## 2015-03-25 DIAGNOSIS — Z954 Presence of other heart-valve replacement: Secondary | ICD-10-CM | POA: Diagnosis not present

## 2015-03-25 DIAGNOSIS — M1990 Unspecified osteoarthritis, unspecified site: Secondary | ICD-10-CM | POA: Diagnosis not present

## 2015-03-25 DIAGNOSIS — Z006 Encounter for examination for normal comparison and control in clinical research program: Secondary | ICD-10-CM

## 2015-03-25 DIAGNOSIS — J9811 Atelectasis: Secondary | ICD-10-CM | POA: Diagnosis not present

## 2015-03-25 DIAGNOSIS — Z8673 Personal history of transient ischemic attack (TIA), and cerebral infarction without residual deficits: Secondary | ICD-10-CM | POA: Diagnosis not present

## 2015-03-25 DIAGNOSIS — G4730 Sleep apnea, unspecified: Secondary | ICD-10-CM | POA: Diagnosis present

## 2015-03-25 DIAGNOSIS — Z7982 Long term (current) use of aspirin: Secondary | ICD-10-CM

## 2015-03-25 DIAGNOSIS — Z8546 Personal history of malignant neoplasm of prostate: Secondary | ICD-10-CM

## 2015-03-25 DIAGNOSIS — I350 Nonrheumatic aortic (valve) stenosis: Secondary | ICD-10-CM | POA: Diagnosis not present

## 2015-03-25 DIAGNOSIS — I359 Nonrheumatic aortic valve disorder, unspecified: Secondary | ICD-10-CM | POA: Diagnosis not present

## 2015-03-25 DIAGNOSIS — I77810 Thoracic aortic ectasia: Secondary | ICD-10-CM | POA: Diagnosis present

## 2015-03-25 DIAGNOSIS — Z952 Presence of prosthetic heart valve: Secondary | ICD-10-CM

## 2015-03-25 DIAGNOSIS — J811 Chronic pulmonary edema: Secondary | ICD-10-CM | POA: Diagnosis not present

## 2015-03-25 DIAGNOSIS — I421 Obstructive hypertrophic cardiomyopathy: Secondary | ICD-10-CM | POA: Diagnosis not present

## 2015-03-25 DIAGNOSIS — Z953 Presence of xenogenic heart valve: Secondary | ICD-10-CM

## 2015-03-25 DIAGNOSIS — Z9889 Other specified postprocedural states: Secondary | ICD-10-CM | POA: Diagnosis not present

## 2015-03-25 HISTORY — DX: Presence of prosthetic heart valve: Z95.2

## 2015-03-25 HISTORY — PX: TEE WITHOUT CARDIOVERSION: SHX5443

## 2015-03-25 HISTORY — PX: TRANSCATHETER AORTIC VALVE REPLACEMENT, TRANSFEMORAL: SHX6400

## 2015-03-25 LAB — POCT I-STAT 3, ART BLOOD GAS (G3+)
ACID-BASE DEFICIT: 1 mmol/L (ref 0.0–2.0)
ACID-BASE DEFICIT: 1 mmol/L (ref 0.0–2.0)
BICARBONATE: 25.2 meq/L — AB (ref 20.0–24.0)
Bicarbonate: 25.9 mEq/L — ABNORMAL HIGH (ref 20.0–24.0)
Bicarbonate: 25.1 mEq/L — ABNORMAL HIGH (ref 20.0–24.0)
Bicarbonate: 24 mEq/L (ref 20.0–24.0)
O2 SAT: 100 %
O2 SAT: 97 %
O2 Saturation: 100 %
O2 Saturation: 100 %
PCO2 ART: 45 mmHg (ref 35.0–45.0)
PCO2 ART: 40.3 mmHg (ref 35.0–45.0)
PH ART: 7.349 — AB (ref 7.350–7.450)
PH ART: 7.37 (ref 7.350–7.450)
PO2 ART: 232 mmHg — AB (ref 80.0–100.0)
Patient temperature: 35.9
TCO2: 27 mmol/L (ref 0–100)
TCO2: 27 mmol/L (ref 0–100)
TCO2: 25 mmol/L (ref 0–100)
TCO2: 26 mmol/L (ref 0–100)
pCO2 arterial: 45.7 mmHg — ABNORMAL HIGH (ref 35.0–45.0)
pCO2 arterial: 43.5 mmHg (ref 35.0–45.0)
pH, Arterial: 7.368 (ref 7.350–7.450)
pH, Arterial: 7.378 (ref 7.350–7.450)
pO2, Arterial: 378 mmHg — ABNORMAL HIGH (ref 80.0–100.0)
pO2, Arterial: 85 mmHg (ref 80.0–100.0)
pO2, Arterial: 395 mmHg — ABNORMAL HIGH (ref 80.0–100.0)

## 2015-03-25 LAB — POCT I-STAT, CHEM 8
BUN: 24 mg/dL — AB (ref 6–20)
BUN: 23 mg/dL — AB (ref 6–20)
BUN: 22 mg/dL — ABNORMAL HIGH (ref 6–20)
BUN: 27 mg/dL — AB (ref 6–20)
BUN: 27 mg/dL — ABNORMAL HIGH (ref 6–20)
CHLORIDE: 104 mmol/L (ref 101–111)
CREATININE: 0.7 mg/dL (ref 0.61–1.24)
CREATININE: 1.3 mg/dL — AB (ref 0.61–1.24)
Calcium, Ion: 1.34 mmol/L — ABNORMAL HIGH (ref 1.13–1.30)
Calcium, Ion: 1.33 mmol/L — ABNORMAL HIGH (ref 1.13–1.30)
Calcium, Ion: 1.27 mmol/L (ref 1.13–1.30)
Calcium, Ion: 1.34 mmol/L — ABNORMAL HIGH (ref 1.13–1.30)
Calcium, Ion: 1.25 mmol/L (ref 1.13–1.30)
Chloride: 105 mmol/L (ref 101–111)
Chloride: 106 mmol/L (ref 101–111)
Chloride: 106 mmol/L (ref 101–111)
Chloride: 107 mmol/L (ref 101–111)
Creatinine, Ser: 0.7 mg/dL (ref 0.61–1.24)
Creatinine, Ser: 0.8 mg/dL (ref 0.61–1.24)
Creatinine, Ser: 1.3 mg/dL — ABNORMAL HIGH (ref 0.61–1.24)
GLUCOSE: 106 mg/dL — AB (ref 65–99)
GLUCOSE: 120 mg/dL — AB (ref 65–99)
GLUCOSE: 130 mg/dL — AB (ref 65–99)
Glucose, Bld: 222 mg/dL — ABNORMAL HIGH (ref 65–99)
Glucose, Bld: 271 mg/dL — ABNORMAL HIGH (ref 65–99)
HCT: 37 % — ABNORMAL LOW (ref 39.0–52.0)
HCT: 37 % — ABNORMAL LOW (ref 39.0–52.0)
HCT: 35 % — ABNORMAL LOW (ref 39.0–52.0)
HCT: 48 % (ref 39.0–52.0)
HEMATOCRIT: 38 % — AB (ref 39.0–52.0)
HEMOGLOBIN: 12.9 g/dL — AB (ref 13.0–17.0)
HEMOGLOBIN: 12.6 g/dL — AB (ref 13.0–17.0)
Hemoglobin: 12.6 g/dL — ABNORMAL LOW (ref 13.0–17.0)
Hemoglobin: 11.9 g/dL — ABNORMAL LOW (ref 13.0–17.0)
Hemoglobin: 16.3 g/dL (ref 13.0–17.0)
POTASSIUM: 3.8 mmol/L (ref 3.5–5.1)
Potassium: 4.3 mmol/L (ref 3.5–5.1)
Potassium: 4 mmol/L (ref 3.5–5.1)
Potassium: 4.1 mmol/L (ref 3.5–5.1)
Potassium: 3.7 mmol/L (ref 3.5–5.1)
SODIUM: 142 mmol/L (ref 135–145)
SODIUM: 140 mmol/L (ref 135–145)
SODIUM: 139 mmol/L (ref 135–145)
Sodium: 144 mmol/L (ref 135–145)
Sodium: 142 mmol/L (ref 135–145)
TCO2: 26 mmol/L (ref 0–100)
TCO2: 25 mmol/L (ref 0–100)
TCO2: 23 mmol/L (ref 0–100)
TCO2: 24 mmol/L (ref 0–100)
TCO2: 23 mmol/L (ref 0–100)

## 2015-03-25 LAB — CBC
HCT: 37.5 % — ABNORMAL LOW (ref 39.0–52.0)
Hemoglobin: 12.5 g/dL — ABNORMAL LOW (ref 13.0–17.0)
MCH: 29.4 pg (ref 26.0–34.0)
MCHC: 33.3 g/dL (ref 30.0–36.0)
MCV: 88.2 fL (ref 78.0–100.0)
Platelets: 126 10*3/uL — ABNORMAL LOW (ref 150–400)
RBC: 4.25 MIL/uL (ref 4.22–5.81)
RDW: 13.2 % (ref 11.5–15.5)
WBC: 5.7 10*3/uL (ref 4.0–10.5)

## 2015-03-25 LAB — GLUCOSE, CAPILLARY
GLUCOSE-CAPILLARY: 121 mg/dL — AB (ref 65–99)
GLUCOSE-CAPILLARY: 93 mg/dL (ref 65–99)
GLUCOSE-CAPILLARY: 97 mg/dL (ref 65–99)
GLUCOSE-CAPILLARY: 196 mg/dL — AB (ref 65–99)
Glucose-Capillary: 106 mg/dL — ABNORMAL HIGH (ref 65–99)
Glucose-Capillary: 101 mg/dL — ABNORMAL HIGH (ref 65–99)

## 2015-03-25 LAB — POCT I-STAT 4, (NA,K, GLUC, HGB,HCT)
GLUCOSE: 122 mg/dL — AB (ref 65–99)
HCT: 37 % — ABNORMAL LOW (ref 39.0–52.0)
HEMOGLOBIN: 12.6 g/dL — AB (ref 13.0–17.0)
POTASSIUM: 4.1 mmol/L (ref 3.5–5.1)
SODIUM: 142 mmol/L (ref 135–145)

## 2015-03-25 LAB — PROTIME-INR
INR: 1.31 (ref 0.00–1.49)
Prothrombin Time: 16.4 seconds — ABNORMAL HIGH (ref 11.6–15.2)

## 2015-03-25 LAB — PREPARE RBC (CROSSMATCH)

## 2015-03-25 LAB — APTT: aPTT: 37 seconds (ref 24–37)

## 2015-03-25 SURGERY — IMPLANTATION, AORTIC VALVE, TRANSCATHETER, FEMORAL APPROACH
Anesthesia: General

## 2015-03-25 MED ORDER — GLYCOPYRROLATE 0.2 MG/ML IJ SOLN
INTRAMUSCULAR | Status: DC | PRN
  Administered 2015-03-25: 0.4 mg via INTRAVENOUS

## 2015-03-25 MED ORDER — FAMOTIDINE IN NACL 20-0.9 MG/50ML-% IV SOLN
20.0000 mg | Freq: Two times a day (BID) | INTRAVENOUS | Status: AC
  Administered 2015-03-25: 20 mg via INTRAVENOUS
  Filled 2015-03-25: qty 50

## 2015-03-25 MED ORDER — ACETAMINOPHEN 160 MG/5ML PO SOLN
1000.0000 mg | Freq: Four times a day (QID) | ORAL | Status: DC
  Administered 2015-03-26: 1000 mg
  Filled 2015-03-25 (×2): qty 40

## 2015-03-25 MED ORDER — DEXMEDETOMIDINE HCL IN NACL 400 MCG/100ML IV SOLN
INTRAVENOUS | Status: DC | PRN
Start: 2015-03-25 — End: 2015-03-25
  Administered 2015-03-25: .3 ug/kg/h via INTRAVENOUS

## 2015-03-25 MED ORDER — PROTAMINE SULFATE 10 MG/ML IV SOLN
INTRAVENOUS | Status: DC | PRN
  Administered 2015-03-25: 100 mg via INTRAVENOUS

## 2015-03-25 MED ORDER — SODIUM CHLORIDE 0.9 % IV SOLN
INTRAVENOUS | Status: DC
  Administered 2015-03-25: 1.2 [IU]/h via INTRAVENOUS
  Filled 2015-03-25: qty 2.5

## 2015-03-25 MED ORDER — MIDAZOLAM HCL 2 MG/2ML IJ SOLN
INTRAMUSCULAR | Status: AC
  Filled 2015-03-25: qty 4

## 2015-03-25 MED ORDER — SODIUM CHLORIDE 0.9 % IJ SOLN
INTRAMUSCULAR | Status: AC
  Filled 2015-03-25: qty 10

## 2015-03-25 MED ORDER — METOPROLOL TARTRATE 1 MG/ML IV SOLN
INTRAVENOUS | Status: DC | PRN
  Administered 2015-03-25: 5 mg via INTRAVENOUS

## 2015-03-25 MED ORDER — PHENYLEPHRINE 40 MCG/ML (10ML) SYRINGE FOR IV PUSH (FOR BLOOD PRESSURE SUPPORT)
PREFILLED_SYRINGE | INTRAVENOUS | Status: AC
  Filled 2015-03-25: qty 10

## 2015-03-25 MED ORDER — CHLORHEXIDINE GLUCONATE 4 % EX LIQD: 60.0000 mL | Freq: Once | CUTANEOUS | Status: DC

## 2015-03-25 MED ORDER — PHENYLEPHRINE HCL 10 MG/ML IJ SOLN
0.0000 ug/min | INTRAVENOUS | Status: DC
  Filled 2015-03-25: qty 2

## 2015-03-25 MED ORDER — PROPOFOL 10 MG/ML IV BOLUS
INTRAVENOUS | Status: DC | PRN
  Administered 2015-03-25: 60 mg via INTRAVENOUS

## 2015-03-25 MED ORDER — MIDAZOLAM HCL 2 MG/2ML IJ SOLN: 2.0000 mg | INTRAMUSCULAR | Status: DC | PRN

## 2015-03-25 MED ORDER — PANTOPRAZOLE SODIUM 40 MG PO TBEC: 40.0000 mg | DELAYED_RELEASE_TABLET | Freq: Every day | ORAL | Status: DC

## 2015-03-25 MED ORDER — METOPROLOL TARTRATE 12.5 MG HALF TABLET: 12.5000 mg | ORAL_TABLET | Freq: Once | ORAL | Status: DC

## 2015-03-25 MED ORDER — INSULIN ASPART 100 UNIT/ML ~~LOC~~ SOLN
0.0000 [IU] | SUBCUTANEOUS | Status: DC
  Administered 2015-03-25: 4 [IU] via SUBCUTANEOUS
  Administered 2015-03-26: 2 [IU] via SUBCUTANEOUS

## 2015-03-25 MED ORDER — LACTATED RINGERS IV SOLN
INTRAVENOUS | Status: DC
  Administered 2015-03-25: 20 mL/h via INTRAVENOUS

## 2015-03-25 MED ORDER — LIDOCAINE HCL (CARDIAC) 20 MG/ML IV SOLN
INTRAVENOUS | Status: AC
  Filled 2015-03-25: qty 5

## 2015-03-25 MED ORDER — DEXTROSE 5 % IV SOLN
1.5000 g | Freq: Two times a day (BID) | INTRAVENOUS | Status: AC
  Administered 2015-03-25 – 2015-03-27 (×4): 1.5 g via INTRAVENOUS
  Filled 2015-03-25 (×5): qty 1.5

## 2015-03-25 MED ORDER — LACTATED RINGERS IV SOLN
INTRAVENOUS | Status: DC | PRN
  Administered 2015-03-25: 07:00:00 via INTRAVENOUS

## 2015-03-25 MED ORDER — ROCURONIUM BROMIDE 100 MG/10ML IV SOLN
INTRAVENOUS | Status: DC | PRN
  Administered 2015-03-25: 35 mg via INTRAVENOUS

## 2015-03-25 MED ORDER — ACETAMINOPHEN 650 MG RE SUPP
650.0000 mg | Freq: Once | RECTAL | Status: AC
Start: 2015-03-25 — End: 2015-03-25

## 2015-03-25 MED ORDER — ONDANSETRON HCL 4 MG/2ML IJ SOLN
INTRAMUSCULAR | Status: AC
  Filled 2015-03-25: qty 2

## 2015-03-25 MED ORDER — MORPHINE SULFATE 2 MG/ML IJ SOLN
2.0000 mg | INTRAMUSCULAR | Status: DC | PRN
  Administered 2015-03-25: 4 mg via INTRAVENOUS
  Filled 2015-03-25: qty 2

## 2015-03-25 MED ORDER — OXYCODONE HCL 5 MG PO TABS
5.0000 mg | ORAL_TABLET | ORAL | Status: DC | PRN
  Administered 2015-03-25 (×3): 10 mg via ORAL
  Filled 2015-03-25 (×3): qty 2

## 2015-03-25 MED ORDER — ONDANSETRON HCL 4 MG/2ML IJ SOLN
4.0000 mg | Freq: Four times a day (QID) | INTRAMUSCULAR | Status: DC | PRN
  Administered 2015-03-26: 4 mg via INTRAVENOUS
  Filled 2015-03-25 (×2): qty 2

## 2015-03-25 MED ORDER — LIDOCAINE HCL (CARDIAC) 20 MG/ML IV SOLN
INTRAVENOUS | Status: DC | PRN
  Administered 2015-03-25: 60 mg via INTRAVENOUS
  Administered 2015-03-25: 80 mg via INTRAVENOUS

## 2015-03-25 MED ORDER — ACETAMINOPHEN 160 MG/5ML PO SOLN
650.0000 mg | Freq: Once | ORAL | Status: AC
  Administered 2015-03-25: 650 mg
  Filled 2015-03-25: qty 20.3

## 2015-03-25 MED ORDER — SODIUM CHLORIDE 0.9 % IV SOLN
INTRAVENOUS | Status: AC
  Administered 2015-03-25: 11:00:00 via INTRAVENOUS

## 2015-03-25 MED ORDER — ACETAMINOPHEN 500 MG PO TABS
1000.0000 mg | ORAL_TABLET | Freq: Four times a day (QID) | ORAL | Status: DC
  Administered 2015-03-26: 1000 mg via ORAL
  Filled 2015-03-25 (×8): qty 2

## 2015-03-25 MED ORDER — MORPHINE SULFATE 2 MG/ML IJ SOLN: 1.0000 mg | INTRAMUSCULAR | Status: AC | PRN

## 2015-03-25 MED ORDER — NEOSTIGMINE METHYLSULFATE 10 MG/10ML IV SOLN
INTRAVENOUS | Status: AC
  Filled 2015-03-25: qty 1

## 2015-03-25 MED ORDER — SODIUM CHLORIDE 0.9 % IV SOLN
INTRAVENOUS | Status: DC
  Administered 2015-03-25: 20 mL/h via INTRAVENOUS

## 2015-03-25 MED ORDER — PHENYLEPHRINE HCL 10 MG/ML IJ SOLN
INTRAMUSCULAR | Status: DC | PRN
  Administered 2015-03-25 (×2): 40 ug via INTRAVENOUS

## 2015-03-25 MED ORDER — SUCCINYLCHOLINE CHLORIDE 20 MG/ML IJ SOLN
INTRAMUSCULAR | Status: DC | PRN
  Administered 2015-03-25: 60 mg via INTRAVENOUS

## 2015-03-25 MED ORDER — SODIUM CHLORIDE 0.9 % IR SOLN
Status: DC | PRN
  Administered 2015-03-25: 500 mL

## 2015-03-25 MED ORDER — VANCOMYCIN HCL IN DEXTROSE 1-5 GM/200ML-% IV SOLN
1000.0000 mg | Freq: Once | INTRAVENOUS | Status: AC
  Administered 2015-03-25: 1000 mg via INTRAVENOUS
  Filled 2015-03-25: qty 200

## 2015-03-25 MED ORDER — HEPARIN SODIUM (PORCINE) 1000 UNIT/ML IJ SOLN
INTRAMUSCULAR | Status: DC | PRN
  Administered 2015-03-25: 10000 [IU] via INTRAVENOUS

## 2015-03-25 MED ORDER — CETYLPYRIDINIUM CHLORIDE 0.05 % MT LIQD
7.0000 mL | Freq: Two times a day (BID) | OROMUCOSAL | Status: DC
Start: 2015-03-25 — End: 2015-03-27
  Administered 2015-03-25 – 2015-03-26 (×3): 7 mL via OROMUCOSAL

## 2015-03-25 MED ORDER — REMIFENTANIL HCL 1 MG IV SOLR
INTRAVENOUS | Status: DC | PRN
  Administered 2015-03-25: .25 ug/kg/min via INTRAVENOUS

## 2015-03-25 MED ORDER — IODIXANOL 320 MG/ML IV SOLN
INTRAVENOUS | Status: DC | PRN
  Administered 2015-03-25: 39.9 mL via INTRAVENOUS

## 2015-03-25 MED ORDER — SODIUM CHLORIDE 0.9 % IV SOLN: Freq: Once | INTRAVENOUS | Status: DC

## 2015-03-25 MED ORDER — SODIUM CHLORIDE 0.9 % IV SOLN: INTRAVENOUS | Status: DC

## 2015-03-25 MED ORDER — PHENYLEPHRINE HCL 10 MG/ML IJ SOLN
20.0000 mg | INTRAVENOUS | Status: DC | PRN
  Administered 2015-03-25: 15 ug/min via INTRAVENOUS

## 2015-03-25 MED ORDER — DOCUSATE SODIUM 100 MG PO CAPS
200.0000 mg | ORAL_CAPSULE | Freq: Every morning | ORAL | Status: DC
  Administered 2015-03-25 – 2015-03-26 (×2): 200 mg via ORAL
  Filled 2015-03-25 (×3): qty 2

## 2015-03-25 MED ORDER — ASPIRIN EC 81 MG PO TBEC
81.0000 mg | DELAYED_RELEASE_TABLET | Freq: Every day | ORAL | Status: DC
  Administered 2015-03-25 – 2015-03-26 (×2): 81 mg via ORAL
  Filled 2015-03-25 (×3): qty 1

## 2015-03-25 MED ORDER — DEXMEDETOMIDINE HCL IN NACL 200 MCG/50ML IV SOLN: 0.1000 ug/kg/h | INTRAVENOUS | Status: DC

## 2015-03-25 MED ORDER — ALBUMIN HUMAN 5 % IV SOLN
INTRAVENOUS | Status: DC | PRN
  Administered 2015-03-25 (×2): via INTRAVENOUS

## 2015-03-25 MED ORDER — CHLORHEXIDINE GLUCONATE 4 % EX LIQD: 30.0000 mL | CUTANEOUS | Status: DC

## 2015-03-25 MED ORDER — 0.9 % SODIUM CHLORIDE (POUR BTL) OPTIME
TOPICAL | Status: DC | PRN
  Administered 2015-03-25: 1000 mL

## 2015-03-25 MED ORDER — CLOPIDOGREL BISULFATE 75 MG PO TABS
75.0000 mg | ORAL_TABLET | Freq: Every morning | ORAL | Status: DC
  Administered 2015-03-25 – 2015-03-26 (×2): 75 mg via ORAL
  Filled 2015-03-25 (×3): qty 1

## 2015-03-25 MED ORDER — ROCURONIUM BROMIDE 50 MG/5ML IV SOLN
INTRAVENOUS | Status: AC
  Filled 2015-03-25: qty 1

## 2015-03-25 MED ORDER — SUCCINYLCHOLINE CHLORIDE 20 MG/ML IJ SOLN
INTRAMUSCULAR | Status: AC
  Filled 2015-03-25: qty 1

## 2015-03-25 MED ORDER — FENTANYL CITRATE (PF) 100 MCG/2ML IJ SOLN
INTRAMUSCULAR | Status: DC | PRN
  Administered 2015-03-25: 100 ug via INTRAVENOUS

## 2015-03-25 MED ORDER — PRAVASTATIN SODIUM 10 MG PO TABS
10.0000 mg | ORAL_TABLET | Freq: Every day | ORAL | Status: DC
  Administered 2015-03-25 – 2015-03-26 (×2): 10 mg via ORAL
  Filled 2015-03-25 (×3): qty 1

## 2015-03-25 MED ORDER — PROPOFOL 10 MG/ML IV BOLUS
INTRAVENOUS | Status: AC
  Filled 2015-03-25: qty 20

## 2015-03-25 MED ORDER — LABETALOL HCL 5 MG/ML IV SOLN
INTRAVENOUS | Status: AC
Start: 2015-03-25 — End: 2015-03-25
  Filled 2015-03-25: qty 4

## 2015-03-25 MED ORDER — EPHEDRINE SULFATE 50 MG/ML IJ SOLN
INTRAMUSCULAR | Status: AC
  Filled 2015-03-25: qty 1

## 2015-03-25 MED ORDER — FENTANYL CITRATE (PF) 250 MCG/5ML IJ SOLN
INTRAMUSCULAR | Status: AC
  Filled 2015-03-25: qty 5

## 2015-03-25 MED ORDER — ONDANSETRON HCL 4 MG/2ML IJ SOLN
INTRAMUSCULAR | Status: DC | PRN
  Administered 2015-03-25: 4 mg via INTRAVENOUS

## 2015-03-25 MED ORDER — METOPROLOL TARTRATE 1 MG/ML IV SOLN
INTRAVENOUS | Status: AC
  Filled 2015-03-25: qty 5

## 2015-03-25 MED ORDER — INSULIN REGULAR BOLUS VIA INFUSION
0.0000 [IU] | Freq: Three times a day (TID) | INTRAVENOUS | Status: DC
  Filled 2015-03-25: qty 10

## 2015-03-25 MED ORDER — LACTATED RINGERS IV SOLN: 500.0000 mL | Freq: Once | INTRAVENOUS | Status: DC | PRN

## 2015-03-25 MED ORDER — NITROGLYCERIN IN D5W 200-5 MCG/ML-% IV SOLN: 0.0000 ug/min | INTRAVENOUS | Status: DC

## 2015-03-25 MED ORDER — TRAMADOL HCL 50 MG PO TABS
50.0000 mg | ORAL_TABLET | ORAL | Status: DC | PRN
  Administered 2015-03-25: 100 mg via ORAL
  Filled 2015-03-25: qty 2

## 2015-03-25 MED ORDER — GLYCOPYRROLATE 0.2 MG/ML IJ SOLN
INTRAMUSCULAR | Status: AC
  Filled 2015-03-25: qty 2

## 2015-03-25 MED ORDER — NEOSTIGMINE METHYLSULFATE 10 MG/10ML IV SOLN
INTRAVENOUS | Status: DC | PRN
  Administered 2015-03-25: 3 mg via INTRAVENOUS

## 2015-03-25 MED ORDER — ALBUMIN HUMAN 5 % IV SOLN: 250.0000 mL | INTRAVENOUS | Status: DC | PRN

## 2015-03-25 MED ORDER — LABETALOL HCL 5 MG/ML IV SOLN
INTRAVENOUS | Status: DC | PRN
  Administered 2015-03-25: 10 mg via INTRAVENOUS

## 2015-03-25 MED FILL — Potassium Chloride Inj 2 mEq/ML: INTRAVENOUS | Qty: 40 | Status: AC

## 2015-03-25 MED FILL — Electrolyte-R (PH 7.4) Solution: INTRAVENOUS | Qty: 3000 | Status: AC

## 2015-03-25 MED FILL — Heparin Sodium (Porcine) Inj 1000 Unit/ML: INTRAMUSCULAR | Qty: 30 | Status: AC

## 2015-03-25 MED FILL — Magnesium Sulfate Inj 50%: INTRAMUSCULAR | Qty: 2 | Status: AC

## 2015-03-25 SURGICAL SUPPLY — 95 items
ADH SKN CLS APL DERMABOND .7 (GAUZE/BANDAGES/DRESSINGS) ×1
BAG BANDED W/RUBBER/TAPE 36X54 (MISCELLANEOUS) ×3 IMPLANT
BAG DECANTER FOR FLEXI CONT (MISCELLANEOUS) IMPLANT
BAG EQP BAND 135X91 W/RBR TAPE (MISCELLANEOUS) ×1
BAG SNAP BAND KOVER 36X36 (MISCELLANEOUS) ×6 IMPLANT
BLADE OSCILLATING /SAGITTAL (BLADE) IMPLANT
BLADE STERNUM SYSTEM 6 (BLADE) ×3 IMPLANT
BLADE SURG ROTATE 9660 (MISCELLANEOUS) IMPLANT
CABLE PACING FASLOC BIEGE (MISCELLANEOUS) ×2 IMPLANT
CABLE PACING FASLOC BLUE (MISCELLANEOUS) ×3 IMPLANT
CANNULA FEM VENOUS REMOTE 22FR (CANNULA) IMPLANT
CANNULA OPTISITE PERFUSION 16F (CANNULA) IMPLANT
CANNULA OPTISITE PERFUSION 18F (CANNULA) IMPLANT
CATH DIAG EXPO 6F AL2 (CATHETERS) ×6 IMPLANT
CATH EXPO 5F AR1 (CATHETERS) ×2 IMPLANT
CATH S G BIP PACING (SET/KITS/TRAYS/PACK) ×5 IMPLANT
CATH STRAIGHT 5FR 65CM (CATHETERS) ×2 IMPLANT
CLIP TI MEDIUM 24 (CLIP) ×3 IMPLANT
CLIP TI WIDE RED SMALL 24 (CLIP) ×3 IMPLANT
COVER BACK TABLE 24X17X13 BIG (DRAPES) ×3 IMPLANT
COVER BACK TABLE 60X90IN (DRAPES) ×2 IMPLANT
COVER DOME SNAP 22 D (MISCELLANEOUS) ×3 IMPLANT
COVER MAYO STAND STRL (DRAPES) ×5 IMPLANT
COVER TABLE BACK 60X90 (DRAPES) ×3 IMPLANT
CRADLE DONUT ADULT HEAD (MISCELLANEOUS) ×3 IMPLANT
DERMABOND ADVANCED (GAUZE/BANDAGES/DRESSINGS) ×2
DERMABOND ADVANCED .7 DNX12 (GAUZE/BANDAGES/DRESSINGS) ×1 IMPLANT
DRAPE INCISE IOBAN 66X45 STRL (DRAPES) IMPLANT
DRAPE SLUSH MACHINE 52X66 (DRAPES) ×3 IMPLANT
DRAPE TABLE COVER HEAVY DUTY (DRAPES) ×3 IMPLANT
DRSG TEGADERM 4X4.75 (GAUZE/BANDAGES/DRESSINGS) ×3 IMPLANT
ELECT REM PT RETURN 9FT ADLT (ELECTROSURGICAL) ×6
ELECTRODE REM PT RTRN 9FT ADLT (ELECTROSURGICAL) ×2 IMPLANT
FELT TEFLON 6X6 (MISCELLANEOUS) ×3 IMPLANT
FEMORAL VENOUS CANN RAP (CANNULA) IMPLANT
GAUZE SPONGE 4X4 12PLY STRL (GAUZE/BANDAGES/DRESSINGS) ×3 IMPLANT
GLOVE ECLIPSE 8.0 STRL XLNG CF (GLOVE) ×6 IMPLANT
GLOVE EUDERMIC 7 POWDERFREE (GLOVE) ×6 IMPLANT
GLOVE ORTHO TXT STRL SZ7.5 (GLOVE) ×6 IMPLANT
GOWN STRL REUS W/ TWL LRG LVL3 (GOWN DISPOSABLE) ×3 IMPLANT
GOWN STRL REUS W/ TWL XL LVL3 (GOWN DISPOSABLE) ×6 IMPLANT
GOWN STRL REUS W/TWL LRG LVL3 (GOWN DISPOSABLE) ×9
GOWN STRL REUS W/TWL XL LVL3 (GOWN DISPOSABLE) ×18
GUIDEWIRE SAF TJ AMPL .035X180 (WIRE) ×3 IMPLANT
GUIDEWIRE SAFE TJ AMPLATZ EXST (WIRE) ×2 IMPLANT
GUIDEWIRE STRAIGHT .035 260CM (WIRE) ×2 IMPLANT
INSERT FOGARTY 61MM (MISCELLANEOUS) ×3 IMPLANT
INSERT FOGARTY SM (MISCELLANEOUS) ×6 IMPLANT
INSERT FOGARTY XLG (MISCELLANEOUS) IMPLANT
KIT BASIN OR (CUSTOM PROCEDURE TRAY) ×3 IMPLANT
KIT DILATOR VASC 18G NDL (KITS) IMPLANT
KIT HEART LEFT (KITS) ×4 IMPLANT
KIT ROOM TURNOVER OR (KITS) ×3 IMPLANT
KIT SUCTION CATH 14FR (SUCTIONS) ×6 IMPLANT
NDL PERC 18GX7CM (NEEDLE) ×1 IMPLANT
NEEDLE PERC 18GX7CM (NEEDLE) ×3 IMPLANT
NS IRRIG 1000ML POUR BTL (IV SOLUTION) ×9 IMPLANT
PACK AORTA (CUSTOM PROCEDURE TRAY) ×3 IMPLANT
PAD ARMBOARD 7.5X6 YLW CONV (MISCELLANEOUS) ×6 IMPLANT
PAD ELECT DEFIB RADIOL ZOLL (MISCELLANEOUS) ×3 IMPLANT
SHEATH PINNACLE 6F 10CM (SHEATH) ×4 IMPLANT
SLEEVE REPOSITIONING LENGTH 30 (MISCELLANEOUS) ×2 IMPLANT
SPONGE GAUZE 4X4 12PLY STER LF (GAUZE/BANDAGES/DRESSINGS) ×4 IMPLANT
SPONGE LAP 4X18 X RAY DECT (DISPOSABLE) ×3 IMPLANT
STOPCOCK 4 WAY LG BORE MALE ST (IV SETS) ×2 IMPLANT
STOPCOCK MORSE 400PSI 3WAY (MISCELLANEOUS) ×3 IMPLANT
SUT ETHIBOND X763 2 0 SH 1 (SUTURE) ×3 IMPLANT
SUT GORETEX CV 4 TH 22 36 (SUTURE) ×5 IMPLANT
SUT GORETEX CV4 TH-18 (SUTURE) ×11 IMPLANT
SUT GORETEX TH-18 36 INCH (SUTURE) ×4 IMPLANT
SUT PROLENE 3 0 SH1 36 (SUTURE) IMPLANT
SUT PROLENE 4 0 RB 1 (SUTURE) ×3
SUT PROLENE 4-0 RB1 .5 CRCL 36 (SUTURE) ×1 IMPLANT
SUT PROLENE 5 0 C 1 36 (SUTURE) ×6 IMPLANT
SUT PROLENE 6 0 C 1 30 (SUTURE) ×6 IMPLANT
SUT SILK  1 MH (SUTURE) ×2
SUT SILK 1 MH (SUTURE) ×1 IMPLANT
SUT SILK 2 0 SH CR/8 (SUTURE) IMPLANT
SUT VIC AB 2-0 CT1 27 (SUTURE) ×3
SUT VIC AB 2-0 CT1 TAPERPNT 27 (SUTURE) ×1 IMPLANT
SUT VIC AB 2-0 CTX 36 (SUTURE) IMPLANT
SUT VIC AB 3-0 SH 8-18 (SUTURE) ×2 IMPLANT
SUT VIC AB 3-0 X1 27 (SUTURE) ×2 IMPLANT
SYR 30ML LL (SYRINGE) ×6 IMPLANT
SYR 50ML LL SCALE MARK (SYRINGE) ×3 IMPLANT
TAPE CLOTH SURG 4X10 WHT LF (GAUZE/BANDAGES/DRESSINGS) ×4 IMPLANT
TOWEL OR 17X26 10 PK STRL BLUE (TOWEL DISPOSABLE) ×6 IMPLANT
TRANSDUCER W/STOPCOCK (MISCELLANEOUS) ×4 IMPLANT
TRAY FOLEY IC TEMP SENS 16FR (CATHETERS) ×3 IMPLANT
TUBING ART PRESS 72  MALE/FEM (TUBING) ×2
TUBING ART PRESS 72 MALE/FEM (TUBING) IMPLANT
TUBING HIGH PRESSURE 120CM (CONNECTOR) ×3 IMPLANT
VALVE HEART TRANSCATH SZ3 29MM (Prosthesis & Implant Heart) ×2 IMPLANT
WIRE AMPLATZ SS-J .035X180CM (WIRE) ×2 IMPLANT
WIRE J 3MM .035X145CM (WIRE) ×2 IMPLANT

## 2015-03-25 NOTE — Anesthesia Procedure Notes (Addendum)
Procedure Name: Intubation Date/Time: 03/25/2015 8:13 AM Performed by: Rebekah Chesterfield L Pre-anesthesia Checklist: Patient identified, Emergency Drugs available, Suction available, Patient being monitored and Timeout performed Patient Re-evaluated:Patient Re-evaluated prior to inductionOxygen Delivery Method: Circle system utilized Preoxygenation: Pre-oxygenation with 100% oxygen Intubation Type: IV induction and Cricoid Pressure applied Ventilation: Mask ventilation without difficulty Laryngoscope Size: Mac and 4 Grade View: Grade III Tube type: Oral Tube size: 8.0 mm Number of attempts: 1 Airway Equipment and Method: Stylet Placement Confirmation: ETT inserted through vocal cords under direct vision,  positive ETCO2 and breath sounds checked- equal and bilateral Secured at: 22 cm Tube secured with: Tape Dental Injury: Teeth and Oropharynx as per pre-operative assessment  Difficulty Due To: Difficult Airway- due to reduced neck mobility and Difficult Airway- due to anterior larynx Future Recommendations: Recommend- induction with short-acting agent, and alternative techniques readily available

## 2015-03-25 NOTE — Progress Notes (Signed)
Echocardiogram Echocardiogram Transesophageal has been performed.  Jennette Dubin 03/25/2015, 9:54 AM

## 2015-03-25 NOTE — Progress Notes (Signed)
TCTS BRIEF SICU PROGRESS NOTE  Day of Surgery  S/P Procedure(s) (LRB): TRANSCATHETER AORTIC VALVE REPLACEMENT, TRANSFEMORAL (N/A) TRANSESOPHAGEAL ECHOCARDIOGRAM (TEE) (N/A)   Looks good and feels well Denies pain, SOB Eating supper NSR w/ stable hemodynamics UOP adequate Labs okay  Plan: Continue routine early post-TAVR  Rexene Alberts 03/25/2015 6:26 PM

## 2015-03-25 NOTE — Transfer of Care (Signed)
Immediate Anesthesia Transfer of Care Note  Patient: Robert Anderson  Procedure(s) Performed: Procedure(s): TRANSCATHETER AORTIC VALVE REPLACEMENT, TRANSFEMORAL (N/A) TRANSESOPHAGEAL ECHOCARDIOGRAM (TEE) (N/A)  Patient Location: SICU  Anesthesia Type:General  Level of Consciousness: awake, alert , oriented and patient cooperative  Airway & Oxygen Therapy: Patient Spontanous Breathing and Patient connected to nasal cannula oxygen  Post-op Assessment: Report given to RN, Post -op Vital signs reviewed and stable and Patient moving all extremities  Post vital signs: Reviewed and stable  Last Vitals:  Filed Vitals:   03/25/15 0551  BP: 144/59  Pulse: 59  Temp: 36.4 C  Resp: 20    Complications: No apparent anesthesia complications

## 2015-03-25 NOTE — Op Note (Signed)
CARDIOTHORACIC SURGERY OPERATIVE NOTE  Date of Procedure:  03/25/2015  Preoperative Diagnosis: Severe Aortic Stenosis   Postoperative Diagnosis: Same   Procedure:    Transcatheter Aortic Valve Replacement - Right Transfemoral Approach  Edwards Sapien 3 Transcatheter Heart Valve (size 29 mm, model # 9600TFX, serial # [id number])   Co-Surgeons: Gaye Pollack, MD and Lauree Chandler, MD  Assistants:             Valentina Gu. Roxy Manns, MD  Anesthesiologist:  Laurie Panda, MD  Echocardiographer:  Ena Dawley, MD  Pre-operative Echo Findings:   severe aortic stenosis   normal left ventricular systolic function   Post-operative Echo Findings:  trivial paravalvular leak  normal left ventricular systolic function      DETAILS OF THE OPERATIVE PROCEDURE  The majority of the procedure is documented separately in a procedure note by Dr. Angelena Form.   TRANSFEMORAL ACCESS:   A small incision is made in the right groin immediately over the common femoral artery. The subcutaneous tissues are divided with electrocautery and the anterior surface of the common femoral artery is identified. Sharp dissection is utilized to free up the artery proximally and distally and the vessel is encircled with a vessel loop.  A pair of CV-4 Gore-tex sutures are place as diamond-shaped purse-strings on the anterior surface of the femoral artery.  The patient is heparinized systemically and ACT verified > 250 seconds.  The common femoral artery is punctured using an  18 gauge needle and a soft J-tipped guidewire is passed into the common iliac artery under fluoroscopic guidance.  A 6 Fr straight diagnostic catheter is placed over the guidewire and the guidewire is removed.  An Amplatz super stiff guidewire is passed through the sheath into the descending thoracic aorta and the introducing diagnostic catheter is removed.  Serial dilators are passed over the guidewire under continuous fluoroscopic guidance,  making certain that each dilator passes easily all of the way into the distal abdominal aorta.  A 16 Fr Edwards E- sheath is passed over the guidewire into the abdominal aorta.  The introducing dilator is removed, the sheath is flushed with heparinized saline, and the sheath is secured to the skin.    FEMORAL SHEATH REMOVAL AND ARTERIAL CLOSURE:  After the completion of successful valve deployment as documented separately by Dr. Angelena Form, the femoral artery sheath is removed and the arteriotomy is closed using the previously placed Gore-tex purse-string sutures. Once the repair has been completed protamine was administered to reverse the anticoagulation. There was a strong pulse distal to the repair and the vessel was large with no visible narrowing. The incision is irrigated with saline solution and subsequently closed in multiple layers using absorbable suture.  The skin incision is closed using a subcuticular skin closure.     Gaye Pollack, MD 03/25/2015

## 2015-03-25 NOTE — Interval H&P Note (Signed)
History and Physical Interval Note:  03/25/2015 5:48 AM  Robert Anderson  has presented today for surgery, with the diagnosis of SEVERE AS  The various methods of treatment have been discussed with the patient and family. After consideration of risks, benefits and other options for treatment, the patient has consented to  Procedure(s): TRANSCATHETER AORTIC VALVE REPLACEMENT, TRANSFEMORAL (N/A) TRANSESOPHAGEAL ECHOCARDIOGRAM (TEE) (N/A) as a surgical intervention .  The patient's history has been reviewed, patient examined, no change in status, stable for surgery.  I have reviewed the patient's chart and labs.  Questions were answered to the patient's satisfaction.     Gaye Pollack

## 2015-03-25 NOTE — Progress Notes (Signed)
MD at bedside, DC left transvenous sheath, continue to monitor BP-hold on starting nitro, ok to give plavix and aspirin.  Rowe Pavy, RN

## 2015-03-25 NOTE — H&P (Signed)
[address]       [place] [address]             [phone]      Cardiothoracic Surgery Admission History and Physical    Referring Provider is Dorothy Spark, MD PCP is Joycelyn Man, MD  Chief Complaint  Patient presents with  . Aortic Stenosis    SEVERE...EVAL FOR TAVR.Marland KitchenECHO 08/05/14.Marland KitchenMarland KitchenCATH 02/20/15.Marland KitchenMarland KitchenPFT 02/27/15    HPI:  The patient is an 81 year old gentleman with a long history of hypertension, hypertrophic cardiomyopathy and stroke last year. He had expressive aphasia that resolved but he still has balance problems. He saw Dr. Meda Coffee at that time and an echo showed moderate to severe aortic stenosis with a mean gradient of 34 mm Hg and a peak of 56 mm Hg. There was severe concentric LVH with a resting LVOT gradient of 30 mm Hg increasing to 105 mm Hg with Valsalva and a hyperdynamic LV. He was started on Toprol and echo in December 2015 [facility] and LVOT obstruction had resolved with initiation of Toprol. However he had a residual mid-cavitary gradient of 80 mmHg. He has remained fairly active over the past few years going to the [facility] 3 times per week for about 30 minutes doing light weight training and riding a stationary bike and using the elliptical machine. He says that he quit going to the [facility] recently after he was referred for cath but is still riding a stationary bike at home. He does report some tiredness and exertional fatigue with activities like pushing his trash cans but does his daily routine without symptoms other than tiredness. He has had some dizziness last year but none recently. He denies chest pain. He underwent cath by Dr. Burt Knack on 02/20/2015 showing minor non-obstructive coronary disease and normal right heart hemodynamics.  He lives with his wife who is in good health. He is still very independent and drives his car but recently started using a cane to to balance problems because he was concerned that he might  fall.  Past Medical History  Diagnosis Date  . HYPERTENSION 05/24/2007    Qualifier: Diagnosis of By: Scherrie Gerlach   . DEGENERATIVE JOINT DISEASE, SPINE 05/24/2007    Qualifier: Diagnosis of By: Scherrie Gerlach   . ERECTILE DYSFUNCTION 05/24/2007    Qualifier: Diagnosis of By: Scherrie Gerlach   . HEARING LOSS 05/24/2007    Qualifier: Diagnosis of By: Scherrie Gerlach   . Aphasia 01/21/2014    5 min spell without assoc sx   . CATARACTS, BILATERAL 05/24/2007    Qualifier: Diagnosis of By: Scherrie Gerlach   . Heart murmur 01/21/2014    undefined apparently present for years   . History of prostate cancer 10/08/2010  . Spell of change in speech 01/21/2014    see above no slurring just couldnt get words out.   . Sleep apnea   . OA (osteoarthritis)   . Cancer     Past Surgical History  Procedure Laterality Date  . Prostate surgery    . Joint replacement    . Hemiarthroplasty shoulder fracture    . Cataract extraction      right 2004, left 2010  . Cardiac catheterization N/A 02/20/2015    Procedure: Right/Left Heart Cath and Coronary Angiography; Surgeon: Sherren Mocha, MD; Location: [facility]; Service: Cardiovascular; Laterality: N/A;    Family History  Problem Relation Age of Onset  . Heart  disease Mother   . Cancer Father   . Cancer Sister   . Prostate cancer Brother   . Heart disease Brother   . Lung cancer Brother   . CVA Mother   . CVA Father     History   Social History  . Marital Status: Married    Spouse Name: N/A  . Number of Children: N/A  . Years of Education: N/A   Occupational History  . Not on file.   Social History Main Topics  . Smoking status: Never Smoker   . Smokeless tobacco: Not on file  . Alcohol Use: No  . Drug Use: No  . Sexual Activity: No    Other Topics Concern  . Not on file   Social History Narrative    Current Outpatient Prescriptions  Medication Sig Dispense Refill  . clopidogrel (PLAVIX) 75 MG tablet Take 75 mg by mouth every morning.    . docusate sodium (COLACE) 100 MG capsule Take 200 mg by mouth every morning.     . metoprolol tartrate (LOPRESSOR) 25 MG tablet Take 12.5 mg by mouth 3 (three) times a week. Monday , Wednesday , Friday    . pravastatin (PRAVACHOL) 10 MG tablet Take 10 mg by mouth at bedtime.    . tamsulosin (FLOMAX) 0.4 MG CAPS capsule Take 0.4 mg by mouth 3 (three) times a week. Monday Wednesday Fridday     No current facility-administered medications for this visit.    No Known Allergies    Review of Systems:  General:normal appetite, decreased energy, no weight gain, no weight loss, no fever Cardiac:no chest pain with exertion, no chest pain at rest, mild SOB with mild exertion, no resting SOB, no PND, no orthopnea, no palpitations, no arrhythmia, no atrial fibrillation, no LE edema, no dizzy spells, no syncope Respiratory:no shortness of breath, no home oxygen, no productive cough, no dry cough, no bronchitis, no wheezing, no hemoptysis, no asthma, no pain with inspiration or cough, no sleep apnea, no CPAP at night GI:no difficulty swallowing, no reflux, no frequent heartburn, no hiatal hernia, no abdominal pain, no constipation, no diarrhea, no hematochezia, no hematemesis, no melena GU:no dysuria, no frequency, no urinary tract infection, no hematuria, no enlarged prostate, no kidney stones, no kidney disease Vascular:no pain suggestive of claudication, no pain in feet, no leg cramps, no varicose veins, no DVT, no non-healing foot  ulcer Neuro:Stoke vs TIA last June 2015, no seizures, no headaches, notemporary blindness one eye, no slurred speech, no peripheral neuropathy, no chronic pain, some instability of gait, no memory/cognitive dysfunction Musculoskeletal:Lower extremity arthritis, no joint swelling, no myalgias, no difficulty walking, normal mobility but some balance problems Skin:no rash, no itching, no skin infections, no pressure sores or ulcerations Psych:no anxiety, no depression, no nervousness, no unusual recent stress Eyes:no blurry vision, no floaters, no recent vision changes, wears glasses or contacts ENT:has hearing loss and wears hearing aid, no loose or painful teeth Hematologic:no easy bruising, no abnormal bleeding, no clotting disorder, no frequent epistaxis Endocrine:no diabetes       Physical Exam:  BP 105/63 mmHg  Pulse 86  Resp 20  Ht 5\' 9"  (1.753 m)  Wt 210 lb (95.255 kg)  BMI 31.00 kg/m2  SpO2 96% General:Elderly, well-appearing, in no distress HEENT:Unremarkable , NCAT, PERLA, EOMI, oropharynx clear Neck:no JVD, no bruits, no adenopathy or thyromegaly, carotid pulses normal with transmitted murmur to both sides of neck. Chest:clear to auscultation, symmetrical breath sounds, no wheezes, no rhonchi  CV:RRR, grade  III/VI crescendo/decrescendo murmur heard best at RSB, no diastolic murmur Abdomen:soft, non-tender, no masses or  organomegaly Extremities:warm, well-perfused, pulses palpable in feet, no LE edema Rectal/GUDeferred Neuro:Grossly non-focal and symmetrical throughout Skin:Clean and dry, no rashes, no breakdown   Diagnostic Tests:              [facility]            [address], [place] [address]              [phone]  ------------------------------------------------------------------- Transthoracic Echocardiography  Patient:  Pancho, Rushing MR #:    [id number] Study Date: 08/05/2014 Gender:   M Age:    22 Height:   177.8 cm Weight:   97.5 kg BSA:    2.22 m^2 Pt. Status: Room:  ATTENDING  Jenkins Rouge, M.D. SONOGRAPHER Victorio Palm, RDCS ORDERING   Ena Dawley, M.D. REFERRING  Ena Dawley, M.D. PERFORMING  [facility], Outpatient  cc:  ------------------------------------------------------------------- LV EF: 55% -  60%  ------------------------------------------------------------------- Indications:   HOCM (I42.1). Aortic stenosis. (I35.0).  ------------------------------------------------------------------- History:  PMH: Acquired from the patient and from the patient&'s chart. 5/6 Holosystolic murmur. Hypertrophic cardiomyopathy, with an ejection fraction of 70%by echocardiography. Outflow obstruction occurs with provocation. Borderline significant aortic stenosis. Risk factors: Hypertension.  ------------------------------------------------------------------- Study Conclusions  - Left ventricle: The cavity size was normal. Wall thickness was increased in a pattern of severe LVH. Systolic function was normal. The estimated ejection fraction was in the range of 55% to 60%. There was dynamic obstruction  during Valsalvain the outflow tract, with mid-cavity obliteration, a peak velocity of 448 cm/sec, and a peak gradient of 80 mm Hg. Doppler parameters are consistent with abnormal left ventricular relaxation (grade 1 diastolic dysfunction). - Aortic valve: Likely trileaflet with fused right and left cusps. There was severe stenosis. - Left atrium: The atrium was moderately dilated. - Atrial septum: No defect or patent foramen ovale was identified. - Impressions: No LVOT gradient or SAM appreciated Primary issues appears to be fixed valvular aortic stenosis  Impressions:  - No LVOT gradient or SAM appreciated Primary issues appears to be fixed valvular aortic stenosis  ------------------------------------------------------------------- Labs, prior tests, procedures, and surgery: Echocardiography (June 2015).  The study demonstrated systemic outflow obstruction. EF was 70%. Severe concentric LVH. LVOT obstruction increase from 24mmHG to 150 mmHG with Valsalva.  Transthoracic echocardiography. M-mode, complete 2D, spectral Doppler, and color Doppler. Birthdate: Patient birthdate: [birth year]. Age: Patient is 81 yr old. Sex: Gender: male. BMI: 30.8 kg/m^2. Blood pressure:   130/75 Patient status: Outpatient. Study date: Study date: 08/05/2014. Study time: 02:02 PM. Location: [facility]  -------------------------------------------------------------------  ------------------------------------------------------------------- Left ventricle: The cavity size was normal. Wall thickness was increased in a pattern of severe LVH. Systolic function was normal. The estimated ejection fraction was in the range of 55% to 60%. There was dynamic obstruction during Valsalvain the outflow tract, with mid-cavity obliteration, a peak velocity of 448 cm/sec, and a peak gradient of 80 mm Hg. Doppler parameters are consistent with abnormal left ventricular relaxation  (grade 1 diastolic dysfunction).  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 Severely calcified leaflets. Doppler:  There was severe stenosis.   Mean gradient (S): 34 mm Hg. Peak gradient (S): 66 mm Hg.  ------------------------------------------------------------------- Aorta: The aorta was normal, not dilated, and non-diseased.  ------------------------------------------------------------------- Mitral valve:  Mildly thickened leaflets . Doppler: There was  trivial regurgitation.  Peak gradient (D): 2 mm Hg.  ------------------------------------------------------------------- Left atrium: The atrium was moderately dilated.  ------------------------------------------------------------------- Atrial septum: No defect or patent foramen ovale was identified.  ------------------------------------------------------------------- Right ventricle: The cavity size was normal. Wall thickness was normal. Systolic function was normal.  ------------------------------------------------------------------- Pulmonic valve:  Structurally normal valve.  Cusp separation was normal. Doppler: Transvalvular velocity was within the normal range. There was trivial regurgitation.  ------------------------------------------------------------------- Tricuspid valve:  Structurally normal valve.  Leaflet separation was normal. Doppler: Transvalvular velocity was within the normal range. There was mild regurgitation.  ------------------------------------------------------------------- Right atrium: The atrium was normal in size.  ------------------------------------------------------------------- Pericardium: The pericardium was normal in appearance.  ------------------------------------------------------------------- Post procedure conclusions Ascending Aorta:  - The aorta was normal,  not dilated, and non-diseased.  ------------------------------------------------------------------- Measurements  Left ventricle             Value    Reference LV ID, ED, PLAX chordal        45  mm   43 - 52 LV ID, ES, PLAX chordal    (L)   22  mm   23 - 38 LV fx shortening, PLAX chordal     51  %   >=29 LV PW thickness, ED          15  mm   --------- IVS/LV PW ratio, ED      (H)   1.4     <=1.3 LV e&', lateral             4.35 cm/s  --------- LV E/e&', lateral            16.39    --------- LV e&', medial             3.7  cm/s  --------- LV E/e&', medial            19.27    --------- LV e&', average             4.03 cm/s  --------- LV E/e&', average            17.71    ---------  Ventricular septum           Value    Reference IVS thickness, ED           21  mm   ---------  LVOT                  Value    Reference LVOT ID, S               20  mm   --------- LVOT area               3.14 cm^2  ---------  Aortic valve              Value    Reference Aortic valve peak velocity, S     406  cm/s  --------- Aortic valve mean velocity, S     275  cm/s  --------- Aortic valve VTI, S          97.8 cm   --------- Aortic mean gradient, S        34  mm Hg --------- Aortic peak gradient, S        66  mm Hg ---------  Aorta                 Value    Reference Aortic root ID, ED  35  mm   ---------  Left atrium              Value    Reference LA ID, A-P, ES             47  mm   --------- LA ID/bsa, A-P             2.12 cm/m^2 <=2.2 LA volume, S               104  ml   --------- LA volume/bsa, S            46.8 ml/m^2 --------- LA volume, ES, 1-p A4C         105  ml   --------- LA volume/bsa, ES, 1-p A4C       47.3 ml/m^2 --------- LA volume, ES, 1-p A2C         104  ml   --------- LA volume/bsa, ES, 1-p A2C       46.8 ml/m^2 ---------  Mitral valve              Value    Reference Mitral E-wave peak velocity      71.3 cm/s  --------- Mitral deceleration time    (H)   370  ms   150 - 230 Mitral peak gradient, D        2   mm Hg --------- Mitral E/A ratio, peak         0.6     ---------  Pulmonary arteries           Value    Reference PA pressure, S, DP           27  mm Hg <=30  Tricuspid valve            Value    Reference Tricuspid regurg peak velocity     247  cm/s  --------- Tricuspid peak RV-RA gradient     24  mm Hg ---------  Systemic veins             Value    Reference Estimated CVP             3   mm Hg ---------  Right ventricle            Value    Reference RV pressure, S, DP           27  mm Hg <=30 RV s&', lateral, S           12.2 cm/s  ---------  Legend: (L) and (H) mark values outside specified reference range.  ------------------------------------------------------------------- Prepared and Electronically Authenticated by  Jenkins Rouge, M.D. 2015-12-21T15:25:44   Cardiac Cath:  Conclusion    FINAL CONCLUSIONS:  Minor nonobstructive coronary artery disease  Known severe aortic stenosis with a heavily calcified aortic valve and restricted aortic valve leaflet motion on plain fluoroscopy  Normal right heart hemodynamics  RECOMMENDATIONS:  Cardiac surgical evaluation by Dr Cyndia Bent for consideration of aortic valve replacement/TAVR     Coronary Findings    Dominance: Right   Left Anterior Descending  The vessel exhibits minimal luminal irregularities.   . Prox LAD lesion, 30% stenosed. calcified .     Left Circumflex  The vessel exhibits minimal luminal irregularities.     Right Coronary Artery  The vessel exhibits minimal luminal irregularities.      Left Heart    Aortic Valve The aortic valve on plain fluoroscopy has bulky severe leaflet calcification  with restricted mobility.    Coronary Diagrams    Diagnostic Diagram            Implants    Name ID Temporary Type Supply   No information to display    Hemo Data       Most Recent Value   Fick Cardiac Output  4.48 L/min   Fick Cardiac Output Index  2.1 (L/min)/BSA   RA A Wave  4 mmHg   RA V Wave  5 mmHg   RA Mean  2 mmHg   RV Systolic Pressure  27 mmHg   RV Diastolic Pressure  -1 mmHg   RV EDP  4 mmHg   PA Systolic Pressure  27 mmHg   PA Diastolic Pressure  7 mmHg   PA Mean  15 mmHg   PW A Wave  8 mmHg   PW V Wave  6 mmHg   PW Mean  5 mmHg   AO Systolic Pressure  952 mmHg   AO Diastolic Pressure  49 mmHg   AO Mean  70 mmHg   QP/QS  1   TPVR Index  7.14 HRUI   TSVR Index  33.29 HRUI   PVR SVR Ratio  0.15   TPVR/TSVR Ratio  0.21    Order-Level Documents:    There are no order-level documents.    Encounter-Level Documents - 02/06/15:      Scan on 02/21/2015 10:57 AM by Provider Default, MDScan on 02/21/2015 10:57 AM by Provider Default, MD     Scan on 02/21/2015 10:22 AM by Provider Default, MDScan on 02/21/2015 10:22 AM by Provider Default, MD     Scan on 02/20/2015 10:29 AM by Provider Default, MDScan on 02/20/2015 10:29 AM by Provider Default, MD     Electronic signature on 02/20/2015 6:54 AM    Signed    Electronically signed by Sherren Mocha, MD on 02/20/15 at 1039 EDT   Josue Hector, MD  Thu Feb 27, 2015 10:51:42 AM EDT     ADDENDUM REPORT: 02/27/2015 10:49  CLINICAL DATA: Aortic stenosis  EXAM: Cardiac TAVR CT  TECHNIQUE: The patient was scanned on a Philips 256 scanner. A 120 kV retrospective scan was triggered in the descending thoracic aorta at 111 HU's. Gantry rotation speed was 270 msecs and collimation was .9 mm. No beta blockade or nitro were given. The 3D data set was reconstructed in 5% intervals of the R-R cycle. Systolic and diastolic phases were analyzed on a dedicated work station using MPR, MIP and VRT modes. The patient received 80 cc of contrast.  FINDINGS: Aortic Valve: Severely calcified trileaflet valve. No significant calcification of the STJ or annulus  Aorta: Tortuous descending thoracic aorta with mild calcification of the inferior surface of the arch and mild ascending aortic root dilatation  Sinotubular Junction: 31 mm  Ascending Thoracic Aorta: 36 mm  Aortic Arch: 33 mm  Descending Thoracic Aorta: 26 mm  Sinus of Valsalva Measurements:  Non-coronary: 36 mm  Right -coronary: 35 mm  Left -coronary: 36 mm  Coronary Artery Height above Annulus:  Left Main: 15 mm  Right Coronary: 17 mm  Virtual Basal Annulus Measurements:  Maximum/Minimum Diameter: 32.2 mm x 25.4 mm  Perimeter: 90.63 mm  Area: 618 mm2  Coronary Arteries: Sufficient height above annulus for deployment and no anomalous origin see cardiac cath report  Optimum Fluoroscopic Angle for Delivery: LAO 12 degrees and Cranial 1 degree  IMPRESSION: 1) Severely calcified tri leaflet aortic valve suitable for  a 29 mm Sapein 3 vlave Annular Area 618 mm2  2) Tortuous descending thoracic aorta with mild aortic root dilatation no contraindications to transfemoral delivery  3) Suitable coronary ostia height for deployment  4) No LAA thrombus  5) Optimum  Angiographic Angle for Delivery LAO 12 degrees Cranial 1 Degree  Jenkins Rouge   Electronically Signed  By: Jenkins Rouge M.D.  On: 02/27/2015 10:49      Study Result     EXAM: OVER-READ INTERPRETATION CT CHEST  The following report is an over-read performed by radiologist Dr. Rebekah Chesterfield [facility] on 02/27/2015. This over-read does not include interpretation of cardiac or coronary anatomy or pathology. The coronary calcium score/coronary CTA interpretation by the cardiologist is attached.  COMPARISON: No priors.  FINDINGS: A contemporaneously CTA of the chest, abdomen and pelvis was also performed today.  IMPRESSION: Please see separate dictation for contemporaneously obtained CTA of the chest, abdomen and pelvis for full description of noncardiac findings.  Electronically Signed: By: Vinnie Langton M.D. On: 02/27/2015 10:22    CLINICAL DATA: 81 year old male with history of severe aortic stenosis. Preprocedural study prior to potential transcatheter aortic valve replacement.  EXAM: CT ANGIOGRAPHY CHEST, ABDOMEN AND PELVIS  TECHNIQUE: Multidetector CT imaging through the chest, abdomen and pelvis was performed using the standard protocol during bolus administration of intravenous contrast. Multiplanar reconstructed images and MIPs were obtained and reviewed to evaluate the vascular anatomy.  CONTRAST: 70 mL OMNIPAQUE IOHEXOL 350 MG/ML SOLN  COMPARISON: CT the abdomen and pelvis 02/25/2005.  FINDINGS: CTA CHEST FINDINGS  Mediastinum/Lymph Nodes: Heart size is enlarged. There is no significant pericardial fluid, thickening or pericardial calcification. There is atherosclerosis of the thoracic aorta, the great vessels of the mediastinum and the coronary arteries, including calcified atherosclerotic plaque in the left main, left anterior descending, left circumflex and right coronary arteries. Severe  calcifications of the aortic valve. No pathologically enlarged mediastinal or hilar lymph nodes. Esophagus is unremarkable in appearance. No axillary lymphadenopathy.  Lungs/Pleura: No acute consolidative airspace disease. No pleural effusions. No suspicious appearing pulmonary nodules or masses. Areas of mild scarring and/or subsegmental atelectasis are noted throughout the dependent portions of the lower lobes of the lungs bilaterally.  Musculoskeletal/Soft Tissues: Status post right shoulder arthroplasty. There are no aggressive appearing lytic or blastic lesions noted in the visualized portions of the skeleton.  CTA ABDOMEN AND PELVIS FINDINGS  Hepatobiliary: No cystic or solid hepatic lesions. No intra or extrahepatic biliary ductal dilatation. Gallbladder is normal in appearance.  Pancreas: No pancreatic mass. No pancreatic ductal dilatation. No pancreatic or peripancreatic fluid or inflammatory changes.  Spleen: Unremarkable.  Adrenals/Urinary Tract: Bilateral adrenal glands are normal in appearance. Several sub cm low-attenuation lesions are noted in the kidneys bilaterally, too small to definitively characterize, but favored to represent small cysts. In addition, there is a well-defined 2.9 cm low-attenuation nonenhancing lesion in the lower pole of the left kidney, compatible with a simple cyst. No hydroureteronephrosis. Urinary bladder is normal in appearance.  Stomach/Bowel: Normal appearance of the stomach. No pathologic dilatation of small bowel or colon. Numerous colonic diverticulae are noted, particularly in the descending and sigmoid colon, without surrounding inflammatory changes to suggest an acute diverticulitis at this time. Normal appendix.  Vascular/Lymphatic: Vascular findings and measurements pertinent to potential TVR procedure, as detailed below. No lymphadenopathy noted in the abdomen or pelvis.  Reproductive: Fiducial markers in the  prostate gland, presumably related to prior radiation therapy. Prostate gland and seminal vesicles are otherwise unremarkable  in appearance.  Other: No significant volume of ascites. No pneumoperitoneum.  Musculoskeletal: There are no aggressive appearing lytic or blastic lesions noted in the visualized portions of the skeleton.  VASCULAR MEASUREMENTS PERTINENT TO TAVR:  AORTA:  Minimal Aortic Diameter - 13 x 15 mm  Severity of Aortic Calcification - mild  RIGHT PELVIS:  Right Common Iliac Artery -  Minimal Diameter - 10.1 x 9.3 mm  Tortuosity - mild  Calcification - mild  Right External Iliac Artery -  Minimal Diameter - 11.0 x 9.8 mm  Tortuosity - severe  Calcification - none  Right Common Femoral Artery -  Minimal Diameter - 10.7 x 9.2 mm  Tortuosity - mild  Calcification - none  LEFT PELVIS:  Left Common Iliac Artery -  Minimal Diameter - 9.4 x 7.8 mm  Tortuosity - mild  Calcification - mild  Left External Iliac Artery -  Minimal Diameter - 9.2 x 10.6 mm  Tortuosity - severe  Calcification - none  Left Common Femoral Artery -  Minimal Diameter - 10.4 x 11.0 mm  Tortuosity - mild  Calcification - mild  Review of the MIP images confirms the above findings.  IMPRESSION: 1. Vascular findings and measurements pertinent to potential TVR procedure, as detailed above. This patient does appear to have suitable pelvic arterial access. Please be aware, however, that there is extensive tortuosity of the external iliac arteries bilaterally. 2. Extensive calcifications of the aortic valve, compatible with the reported clinical history of severe aortic stenosis. 3. Colonic diverticulosis without evidence of acute diverticulitis at this time. 4. Additional incidental findings, as above.   Electronically Signed  By: Vinnie Langton M.D.  On: 02/27/2015 11:24             *[address], [place] [id number]              [phone]  ------------------------------------------------------------------- Transesophageal Echocardiography  Patient:  Cruze, Zingaro MR #:    [id number] Study Date: 03/07/2015 Gender:   M Age:    24 Height: Weight: BSA: Pt. Status: Room:  cc:  ------------------------------------------------------------------- LV EF: 60% -  65%  ------------------------------------------------------------------- Study Conclusions  - Left ventricle: There was severe LVH more pronounced in the basal portion of the interventricular septum measuring 25 mm. This is causing narrowing of the LVOT without obstruction. There is no SAM. Systolic function was normal. The estimated ejection fraction was in the range of 60% to 65%. Wall motion was normal; there were no regional wall motion abnormalities. - Aortic valve: Cusp separation was severely restricted. - Aorta: There was moderate non-mobile atheroma. - Mitral valve: Moderate thickening predominantly of the anterior wall. There was mild to moderate regurgitation directed centrally. - Left atrium: The atrium was dilated. No evidence of thrombus in the atrial cavity or appendage. The appendage was morphologically a left appendage, multilobulated, and of normal size. Emptying velocity was normal. - Right ventricle: The cavity size was normal. Wall thickness was normal. Systolic function was normal. - Right atrium: No evidence of thrombus in the atrial cavity or appendage. - Tricuspid valve: There was mild regurgitation. - Pericardium, extracardiac: There was no pericardial effusion.  Impressions:  - There was severe LVH more pronounced in the basal portion of the interventricular septum measuring 25 mm. This  is causing narrowing of  the LVOT without obstruction or SAM. The LVOT diameter just bellow the aortic valve measures 26 mm. 22 mm when measured 4 mm bellow and 19 mm when measured 8 mm bellow the valve. This is caused by a significant basal septal thickening and might be a potential complication for valve deployment.  Diagnostic transesophageal echocardiography. 2D and color Doppler. Birthdate: Patient birthdate: [birth year]. Age: Patient is 81 yr old. Sex: Gender: male. Study date: Study date: 03/07/2015. Study time: 09:13 AM.  -------------------------------------------------------------------  ------------------------------------------------------------------- Left ventricle: There was severe LVH more pronounced in the basal portion of the interventricular septum measuring 25 mm. This is causing narrowing of the LVOT without obstruction. There is no SAM. Systolic function was normal. The estimated ejection fraction was in the range of 60% to 65%. Wall motion was normal; there were no regional wall motion abnormalities.  ------------------------------------------------------------------- Aortic valve:  Trileaflet; severely thickened, severely calcified leaflets. Cusp separation was severely restricted. Doppler: There was no significant regurgitation.  ------------------------------------------------------------------- Aorta: There was moderate non-mobile atheroma. There was no evidence for dissection. Aortic root: The aortic root was not dilated.  ------------------------------------------------------------------- Mitral valve: Moderate thickening predominantly of the anterior wall. Leaflet separation was normal. Doppler: There was mild to moderate regurgitation directed centrally.  ------------------------------------------------------------------- Left atrium: The atrium was dilated. No evidence of thrombus in the atrial cavity or appendage. The appendage was  morphologically a left appendage, multilobulated, and of normal size. Emptying velocity was normal.  ------------------------------------------------------------------- Right ventricle: The cavity size was normal. Wall thickness was normal. Systolic function was normal.  ------------------------------------------------------------------- Tricuspid valve:  Structurally normal valve.  Leaflet separation was normal. Doppler: There was mild regurgitation.  ------------------------------------------------------------------- Pulmonary artery:  The main pulmonary artery was normal-sized.  ------------------------------------------------------------------- Right atrium: The atrium was normal in size. No evidence of thrombus in the atrial cavity or appendage. The appendage was morphologically a right appendage.  ------------------------------------------------------------------- Pericardium: There was no pericardial effusion.  ------------------------------------------------------------------- Prepared and Electronically Authenticated by  Ena Dawley, M.D. 2016-07-22T18:40:11  STS Risk Calculator Procedure: AV Replacement Risk of Mortality: 3.002% Morbidity or Mortality: 20.623% Long Length of Stay: 9.881% Short Length of Stay: 22.766% Permanent Stroke: 3.227% Prolonged Ventilation: 12.03% DSW Infection: 0.269% Renal Failure: 6.59% Reoperation: 8.553%  Impression:  This 81 year old gentleman has severe stage D symptomatic aortic stenosis in addition to longstanding hypertrophic cardiomyopathy. I have personally reviewed his echo studies, catheterization, and CT scans. He has a trileaflet aortic valve with fusion of the left and right cusps with severe calcification and stenosis. 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 There is a mid-cavitary gradient of 80 mm Hg during valsalva. There is no distinct septal bulge. He has  tolerated his hypertrophic cardiomyopathy well but has developed some progression of exertional fatigue and dyspnea over the past year or so that is probably related to progression of his aortic stenosis. I think he would be a high risk candidate for open surgical AVR due to his advanced age and prior stroke with some ongoing balance problems. I think TAVR would be a reasonable option for him. The hypertrophic ventricle could pose some risk of valve dislodgement during deployment. His TEE does show marked concentric LVH with prominent basal septal hypertrophy but there is no obstruction in the LVOT and no SAM.  His CT scans show that he would be a candidate for a 29 mm Sapien 3 valve via a transfemoral route.   The patient has been counseled extensively  as to the relative risks and benefits of all options for the treatment of severe aortic stenosis including long term medical therapy, conventional surgery for aortic valve replacement, and transcatheter aortic valve replacement. Following the decision to consider proceeding with further evaluation for transcatheter aortic valve replacement, a discussion has been held regarding what types of management strategies would be attempted intraoperatively in the event of life-threatening complications, including whether or not the patient would be considered a candidate for the use of cardiopulmonary bypass and/or conversion to open sternotomy for attemptedsurgical intervention. The patient has been advised of a variety of complications that might develop including but not limited to risks of death, stroke, paravalvular leak, aortic dissection or other major vascular complications, aortic annulus rupture, device embolization, cardiac rupture or perforation, mitral regurgitation, acute myocardial infarction, arrhythmia, heart block or bradycardia requiring permanent pacemaker placement, congestive heart failure, respiratory failure, renal failure, pneumonia, infection,  other late complications related to structural valve deterioration or migration, or other complications that might ultimately cause a temporary or permanent loss of functional independence or other long term morbidity. The patient provides full informed consent for the procedure as described and all questions were answered.   Plan:   Transfemoral TAVR using a 29 mm Sapien 3 valve.  Gaye Pollack, MD

## 2015-03-25 NOTE — Op Note (Signed)
[facility]  TAVR OPERATIVE NOTE   Date of Procedure:  03/25/2015  Preoperative Diagnosis: Severe Aortic Stenosis   Postoperative Diagnosis: Same   Procedure:    Transcatheter Aortic Valve Replacement - Transfemoral Approach  Edwards Sapien 3 THV (size 29 mm, model  [id number], serial number [id number])    Co-Surgeons:  Gaye Pollack, MD and Lauree Chandler, MD  Assistants:   Darylene Price, MD  Anesthesiologist:  Ermalene Postin  Echocardiographer:  Meda Coffee  Pre-operative Echo Findings:  Severe aortic stenosis  Normal left ventricular systolic function  Post-operative Echo Findings:  Trivial paravalvular leak  Normal left ventricular systolic function  BRIEF CLINICAL NOTE AND INDICATIONS FOR SURGERY  81 year old gentleman with long-standing history of aortic stenosis, hypertension and hypertrophic obstructive cardiomyopathy who is here today for TAVR. The patient was noted to have a heart murmur and diagnosed with hypertrophic obstructive cardiomyopathy 20 years ago. He was told to avoid exercising in hot weather and to make certain never to allow him self to become dehydrated. He did quite well and remain physically active until a proximally 1 year ago when he suffered a brief TIA. Symptoms at the time were manifest as a 5 minute episode of aphasia which resolved. An echocardiogram was performed demonstrating the presence of moderate to severe aortic stenosis with normal left ventricular systolic function. He was referred to Dr. Meda Coffee for formal cardiology evaluation. Repeat echocardiogram performed 08/05/2014 confirmed the presence of severe aortic stenosis with peak velocity across the valve measuring 4 m/s corresponding to mean transvalvular gradient of 34 mmHg. The patient also was noted to have dynamic left obstruction during Valsalva maneuver with peak velocity increasing to 4.5 m/s. There was severe left ventricular hypertrophy. The patient was referred to Dr.  Burt Knack and underwent diagnostic cardiac catheterization on 02/20/2015. He was found to have mild nonobstructive coronary artery disease with normal right heart hemodynamics. Attempts to cross the aortic valve to directly measure transvalvular and left ventricular outflow tract gradients were unsuccessful. CT angiography has been performed to evaluate the potential feasibility of transcatheter aortic valve replacement.   During the course of the patient's preoperative work up they have been evaluated comprehensively by a multidisciplinary team of specialists coordinated through the [facility] in the [facility].  They have been demonstrated to suffer from symptomatic severe aortic stenosis as noted above. The patient has been counseled extensively as to the relative risks and benefits of all options for the treatment of severe aortic stenosis including long term medical therapy, conventional surgery for aortic valve replacement, and transcatheter aortic valve replacement.  The patient has been independently evaluated by two cardiac surgeons including Dr Roxy Manns and Dr. Cyndia Bent, and they are felt to be at high risk for conventional surgical aortic valve replacement based upon a predicted risk of mortality using the Society of Thor[employer] risk calculator of 2.8%. Both surgeons indicated the patient would be a poor candidate for conventional surgery because of comorbidities including HOCM, advanced age.   Based upon review of all of the patient's preoperative diagnostic tests they are felt to be candidate for transcatheter aortic valve replacement using the transfemoral approach as an alternative to high risk conventional surgery.    Following the decision to proceed with transcatheter aortic valve replacement, a discussion has been held regarding what types of management strategies would be attempted intraoperatively in the event of life-threatening  complications, including whether or not the patient would be considered a candidate for  the use of cardiopulmonary bypass and/or conversion to open sternotomy for attempted surgical intervention.  The patient has been advised of a variety of complications that might develop peculiar to this approach including but not limited to risks of death, stroke, paravalvular leak, aortic dissection or other major vascular complications, aortic annulus rupture, device embolization, cardiac rupture or perforation, acute myocardial infarction, arrhythmia, heart block or bradycardia requiring permanent pacemaker placement, congestive heart failure, respiratory failure, renal failure, pneumonia, infection, other late complications related to structural valve deterioration or migration, or other complications that might ultimately cause a temporary or permanent loss of functional independence or other long term morbidity.  The patient provides full informed consent for the procedure as described and all questions were answered preoperatively.    DETAILS OF THE OPERATIVE PROCEDURE  PREPARATION:    The patient is brought to the operating room on the above mentioned date and central monitoring was established by the anesthesia team including placement of Swan-Ganz catheter and radial arterial line. The patient is placed in the supine position on the operating table.  Intravenous antibiotics are administered. General endotracheal anesthesia is induced uneventfully. A Foley catheter is placed.  Baseline transesophageal echocardiogram was performed. The patient's chest, abdomen, both groins, and both lower extremities are prepared and draped in a sterile manner. A time out procedure is performed.   PERIPHERAL ACCESS:    Using the modified Seldinger technique, femoral arterial and venous access was obtained with placement of 6 Fr sheaths on the left side.  A pigtail diagnostic catheter was passed through the left femoral  arterial sheath under fluoroscopic guidance into the aortic root.  A temporary transvenous pacemaker catheter was passed through the left femoral venous sheath under fluoroscopic guidance into the right ventricle.  The pacemaker was tested to ensure stable lead placement and pacemaker capture. Aortic root angiography was performed in order to determine the optimal angiographic angle for valve deployment.   TRANSFEMORAL ACCESS:   A right femoral arterial cutdown was performed by Dr Cyndia Bent. Please see his separate operative note for details. The patient was heparinized systemically and ACT verified > 250 seconds.    A 16 Fr transfemoral E-sheath was introduced into the right femoral artery after progressively dilating over an Amplatz superstiff wire. An AL-2 catheter was used to direct a straight-tip exchange length wire across the native aortic valve into the left ventricle. This was exchanged out for a pigtail catheter and position was confirmed in the LV apex. Simultaneous LV and Ao pressures were recorded.  The pigtail catheter was then exchanged for an Amplatz Extra-stiff wire in the LV apex. At that point, BAV was performed using a 26 mm valvuloplasty balloon. This was repeated with a 26 mm True balloon.  Once optimal position was achieved, BAV was done under rapid ventricular pacing at 180 bpm. The patient recovered well hemodynamically.   TRANSCATHETER HEART VALVE DEPLOYMENT:  An Edwards Sapien 3 THV (size 29 mm) was prepared and crimped per manufacturer's guidelines, and the proper orientation of the valve is confirmed on the [employer] delivery system. The valve was advanced through the introducer sheath using normal technique until in an appropriate position in the abdominal aorta beyond the sheath tip. The balloon was then retracted and using the fine-tuning wheel was centered on the valve. The valve was then advanced across the aortic arch using appropriate flexion of the catheter. The  valve was carefully positioned across the aortic valve annulus. The Commander catheter was retracted using normal technique.  Once final position of the valve has been confirmed by angiographic assessment, the valve is deployed while temporarily holding ventilation and during rapid ventricular pacing to maintain systolic blood pressure < 50 mmHg and pulse pressure < 10 mmHg. The balloon inflation is held for >3 seconds after reaching full deployment volume. Once the balloon has fully deflated the balloon is retracted into the ascending aorta and valve function is assessed using TEE. There is felt to be trivial paravalvular leak and no central aortic insufficiency.  The patient's hemodynamic recovery following valve deployment is good.  The deployment balloon and guidewire are both removed. Echo demostrated acceptable post-procedural gradients, stable mitral valve function, and trivial AI.   PROCEDURE COMPLETION:  The sheath was then removed and arteriotomy repaired by Dr Cyndia Bent. Please see his separate report for details. Protamine was administered once femoral arterial repair was complete. The temporary pacemaker, pigtail catheters and femoral sheaths were removed with manual pressure used for hemostasis.   The patient tolerated the procedure well and is transported to the surgical intensive care in stable condition. There were no immediate intraoperative complications. All sponge instrument and needle counts are verified correct at completion of the operation.   No blood products were administered during the operation.  The patient received a total of 39.9 mL of intravenous contrast during the procedure.  Ryenne Lynam MD 03/25/2015 9:55 AM

## 2015-03-26 ENCOUNTER — Inpatient Hospital Stay (HOSPITAL_COMMUNITY): Payer: Medicare Other

## 2015-03-26 DIAGNOSIS — I359 Nonrheumatic aortic valve disorder, unspecified: Secondary | ICD-10-CM

## 2015-03-26 DIAGNOSIS — I350 Nonrheumatic aortic (valve) stenosis: Principal | ICD-10-CM

## 2015-03-26 DIAGNOSIS — Z954 Presence of other heart-valve replacement: Secondary | ICD-10-CM

## 2015-03-26 LAB — GLUCOSE, CAPILLARY
GLUCOSE-CAPILLARY: 115 mg/dL — AB (ref 65–99)
GLUCOSE-CAPILLARY: 103 mg/dL — AB (ref 65–99)
Glucose-Capillary: 112 mg/dL — ABNORMAL HIGH (ref 65–99)
Glucose-Capillary: 128 mg/dL — ABNORMAL HIGH (ref 65–99)
Glucose-Capillary: 153 mg/dL — ABNORMAL HIGH (ref 65–99)
Glucose-Capillary: 64 mg/dL — ABNORMAL LOW (ref 65–99)
Glucose-Capillary: 96 mg/dL (ref 65–99)

## 2015-03-26 LAB — BASIC METABOLIC PANEL
Anion gap: 6 (ref 5–15)
BUN: 14 mg/dL (ref 6–20)
CO2: 26 mmol/L (ref 22–32)
Calcium: 8.6 mg/dL — ABNORMAL LOW (ref 8.9–10.3)
Chloride: 107 mmol/L (ref 101–111)
Creatinine, Ser: 0.94 mg/dL (ref 0.61–1.24)
GFR calc Af Amer: >60 mL/min (ref >60)
GFR calc non Af Amer: >60 mL/min (ref >60)
Glucose, Bld: 126 mg/dL — ABNORMAL HIGH (ref 65–99)
Potassium: 4.3 mmol/L (ref 3.5–5.1)
Sodium: 139 mmol/L (ref 135–145)

## 2015-03-26 LAB — CBC
HCT: 38.1 % — ABNORMAL LOW (ref 39.0–52.0)
HEMOGLOBIN: 12.8 g/dL — AB (ref 13.0–17.0)
MCH: 30 pg (ref 26.0–34.0)
MCHC: 33.6 g/dL (ref 30.0–36.0)
MCV: 89.4 fL (ref 78.0–100.0)
PLATELETS: 121 10*3/uL — AB (ref 150–400)
RBC: 4.26 MIL/uL (ref 4.22–5.81)
RDW: 13.2 % (ref 11.5–15.5)
WBC: 8.1 10*3/uL (ref 4.0–10.5)

## 2015-03-26 LAB — MAGNESIUM: Magnesium: 1.9 mg/dL (ref 1.7–2.4)

## 2015-03-26 NOTE — Anesthesia Preprocedure Evaluation (Signed)
Anesthesia Evaluation  Patient identified by MRN, date of birth, ID band Patient awake    Reviewed: Allergy & Precautions, NPO status , Patient's Chart, lab work & pertinent test results, reviewed documented beta blocker date and time   History of Anesthesia Complications Negative for: history of anesthetic complications  Airway Mallampati: II  TM Distance: >3 FB Neck ROM: Full    Dental no notable dental hx.    Pulmonary sleep apnea , neg COPDneg recent URI,  breath sounds clear to auscultation        Cardiovascular hypertension, Pt. on medications and Pt. on home beta blockers + Valvular Problems/Murmurs AS Rhythm:Regular + Systolic murmurs    Neuro/Psych TIAnegative psych ROS   GI/Hepatic negative GI ROS, Neg liver ROS,   Endo/Other  negative endocrine ROS  Renal/GU negative Renal ROS     Musculoskeletal   Abdominal   Peds  Hematology negative hematology ROS (+)   Anesthesia Other Findings   Reproductive/Obstetrics                             Anesthesia Physical Anesthesia Plan  ASA: IV  Anesthesia Plan: General   Post-op Pain Management:    Induction: Intravenous  Airway Management Planned: Oral ETT  Additional Equipment: Arterial line, TEE, CVP, PA Cath and Ultrasound Guidance Line Placement  Intra-op Plan:   Post-operative Plan: Extubation in OR and Possible Post-op intubation/ventilation  Informed Consent: I have reviewed the patients History and Physical, chart, labs and discussed the procedure including the risks, benefits and alternatives for the proposed anesthesia with the patient or authorized representative who has indicated his/her understanding and acceptance.   Dental advisory given  Plan Discussed with: CRNA and Surgeon  Anesthesia Plan Comments:         Anesthesia Quick Evaluation

## 2015-03-26 NOTE — Progress Notes (Signed)
  Echocardiogram 2D Echocardiogram has been performed.  Darlina Sicilian M 03/26/2015, 10:25 AM

## 2015-03-26 NOTE — Anesthesia Postprocedure Evaluation (Signed)
  Anesthesia Post-op Note  Patient: Robert Anderson  Procedure(s) Performed: Procedure(s): TRANSCATHETER AORTIC VALVE REPLACEMENT, TRANSFEMORAL (N/A) TRANSESOPHAGEAL ECHOCARDIOGRAM (TEE) (N/A)  Patient Location: ICU  Anesthesia Type:General  Level of Consciousness: awake  Airway and Oxygen Therapy: Patient Spontanous Breathing and Patient connected to nasal cannula oxygen  Post-op Pain: none  Post-op Assessment: Post-op Vital signs reviewed, Patient's Cardiovascular Status Stable, Respiratory Function Stable, Patent Airway, No signs of Nausea or vomiting and Pain level controlled              Post-op Vital Signs: Reviewed and stable  Last Vitals:  Filed Vitals:   03/26/15 1200  BP: 139/55  Pulse: 59  Temp:   Resp: 12    Complications: No apparent anesthesia complications

## 2015-03-26 NOTE — Progress Notes (Signed)
Pt received from 2 heart to room 2W27, alert and oriented, vitals stable, denies pain, was a little nauseated but relief with Zofran from [place]. Will continue to monitor pt .

## 2015-03-26 NOTE — Progress Notes (Signed)
     SUBJECTIVE: Feels great. No chest pain or SOB.   BP 106/49 mmHg  Pulse 54  Temp(Src) 98.1 F (36.7 C) (Oral)  Resp 19  Ht 5\' 9"  (1.753 m)  Wt 216 lb 14.9 oz (98.4 kg)  BMI 32.02 kg/m2  SpO2 95%  Intake/Output Summary (Last 24 hours) at 03/26/15 0719 Last data filed at 03/26/15 0700  Gross per 24 hour  Intake 3138.82 ml  Output   1660 ml  Net 1478.82 ml    PHYSICAL EXAM General: Well developed, well nourished, in no acute distress. Alert and oriented x 3.  Psych:  Good affect, responds appropriately Neck: No JVD. No masses noted.  Lungs: Clear bilaterally with no wheezes or rhonci noted.  Heart: RRR with systolic murmur noted. Abdomen: Bowel sounds are present. Soft, non-tender.  Extremities: No lower extremity edema.   LABS: Basic Metabolic Panel:  Recent Labs  03/25/15 1232 03/26/15 0455  NA 139 139  K 3.7 4.3  CL 107 107  CO2  --  26  GLUCOSE 271* 126*  BUN 27* 14  CREATININE 1.30* 0.94  CALCIUM  --  8.6*  MG  --  1.9   CBC:  Recent Labs  03/25/15 1051  03/25/15 1232 03/26/15 0455  WBC 5.7  --   --  8.1  HGB 12.5*  < > 16.3 12.8*  HCT 37.5*  < > 48.0 38.1*  MCV 88.2  --   --  89.4  PLT 126*  --   --  121*  < > = values in this interval not displayed.   Current Meds: . sodium chloride   Intravenous Once  . acetaminophen  1,000 mg Oral 4 times per day   Or  . acetaminophen (TYLENOL) oral liquid 160 mg/5 mL  1,000 mg Per Tube 4 times per day  . antiseptic oral rinse  7 mL Mouth Rinse BID  . aspirin EC  81 mg Oral Daily  . cefUROXime (ZINACEF)  IV  1.5 g Intravenous Q12H  . clopidogrel  75 mg Oral q morning - 10a  . docusate sodium  200 mg Oral q morning - 10a  . famotidine (PEPCID) IV  20 mg Intravenous Q12H  . insulin aspart  0-24 Units Subcutaneous 6 times per day  . [START ON 03/27/2015] pantoprazole  40 mg Oral Daily  . pravastatin  10 mg Oral QHS     ASSESSMENT AND PLAN:  1. Severe aortic valve stenosis: POD# 1 s/p TAVR. He is  doing well. BP is stable. He is in sinus this am.  Will continue ASA and Plavix. Echo today to assess valve.   2. HOCM: Will follow HR. Will not restart beta blocker today with bradycardia.   D/C lines and foley. Transfer to telemetry unit.   MCALHANY,CHRISTOPHER  8/10/20167:19 AM

## 2015-03-26 NOTE — Progress Notes (Signed)
Hypoglycemic Event  CBG: 64  Treatment: 15 GM carbohydrate snack  Symptoms: None  Follow-up CBG: Time:0015 CBG Result:112  Possible Reasons for Event: Unknown  Comments/MD notified:NA protocol    Samule Ohm  Remember to initiate Hypoglycemia Order Set & complete

## 2015-03-26 NOTE — Progress Notes (Signed)
Pt c/o nausea. Nurse noticed possible change in EKG from morning EKG. Obtained stat EKG to r/o any abnormalities. EKG showed no change from morning EKG. Nurse will continue to monitor closely.   Melinda Crutch RN

## 2015-03-27 ENCOUNTER — Other Ambulatory Visit: Payer: Self-pay | Admitting: Physician Assistant

## 2015-03-27 ENCOUNTER — Encounter (HOSPITAL_COMMUNITY): Payer: Self-pay | Admitting: Cardiovascular Disease

## 2015-03-27 DIAGNOSIS — Z952 Presence of prosthetic heart valve: Secondary | ICD-10-CM

## 2015-03-27 LAB — BASIC METABOLIC PANEL
Anion gap: 7 (ref 5–15)
BUN: 12 mg/dL (ref 6–20)
CHLORIDE: 107 mmol/L (ref 101–111)
CO2: 26 mmol/L (ref 22–32)
Calcium: 9 mg/dL (ref 8.9–10.3)
Creatinine, Ser: 0.93 mg/dL (ref 0.61–1.24)
GFR calc Af Amer: >60 mL/min (ref >60)
GFR calc non Af Amer: >60 mL/min (ref >60)
Glucose, Bld: 100 mg/dL — ABNORMAL HIGH (ref 65–99)
POTASSIUM: 4 mmol/L (ref 3.5–5.1)
SODIUM: 140 mmol/L (ref 135–145)

## 2015-03-27 LAB — CBC
HCT: 39.7 % (ref 39.0–52.0)
Hemoglobin: 13.1 g/dL (ref 13.0–17.0)
MCH: 29.6 pg (ref 26.0–34.0)
MCHC: 33 g/dL (ref 30.0–36.0)
MCV: 89.6 fL (ref 78.0–100.0)
PLATELETS: 133 10*3/uL — AB (ref 150–400)
RBC: 4.43 MIL/uL (ref 4.22–5.81)
RDW: 13.1 % (ref 11.5–15.5)
WBC: 7.6 10*3/uL (ref 4.0–10.5)

## 2015-03-27 LAB — GLUCOSE, CAPILLARY: GLUCOSE-CAPILLARY: 108 mg/dL — AB (ref 65–99)

## 2015-03-27 MED ORDER — METOPROLOL TARTRATE 12.5 MG HALF TABLET
12.5000 mg | ORAL_TABLET | Freq: Two times a day (BID) | ORAL | Status: DC
  Administered 2015-03-27: 12.5 mg via ORAL
  Filled 2015-03-27 (×2): qty 1

## 2015-03-27 NOTE — Discharge Instructions (Signed)
Aortic Valve Replacement  Aortic valve replacement is a procedure to replace an aortic valve that cannot be repaired. An artificial (prosthetic) valve is used to do this. Three types of prosthetic valves are available:   Mechanical valves made entirely from man-made materials.   Donor valves made from human donors. These are only used in special situations.   Biological valves made from animal tissues.  The type of prosthetic valve used will be determined based on various factors, including your age, your lifestyle, and other medical conditions you have.  LET [facility] CARE PROVIDER KNOW ABOUT:  Any allergies you have.  All medicines you are taking, including vitamins, herbs, eye drops, creams, and over-the-counter medicines.  Previous problems you or members of your family have had with the use of anesthetics.  Any blood disorders you have.  Previous surgeries you have had.  Medical conditions you have. RISKS AND COMPLICATIONS Generally, this is a safe procedure. However, as with any procedure, problems can occur. Possible problems include:   Blood clotting caused by the new valve. Replacement with a mechanical valve requires lifelong treatment with medicine to prevent blood clots.   Infection in the new valve.   Valve failure.   Bleeding.   Reaction to anesthetics.  BEFORE THE PROCEDURE  Ask your health care provider about:  Changing or stopping your regular medicines. This is especially important if you are taking diabetes medicines or blood thinners.  Taking medicines such as aspirin and ibuprofen. These medicines can thin your blood. Do not take these medicines before your procedure if your health care provider asks you not to.  Do not eat or drink anything after midnight on the night before the procedure or as directed by your health care provider. PROCEDURE  The surgeon may use either an open technique or a minimally invasive technique for this surgery:    Traditional open surgery  You will be given a medicine that makes you fall asleep (general anesthetic).  You will then be placed on a heart-lung bypass machine. This machine provides oxygen to your blood while the heart is undergoing surgery.  During surgery, the surgeon will make a large cut (incision) in the chest.  The heart will be cooled to slow or stop the heartbeat.  The damaged aortic valve will be removed and replaced with a prosthetic heart valve.  The incision will then be closed with staples or stitches. Minimally invasive surgery This is done through a smaller incision. This still requires general anesthetic and the heart-lung bypass machine. Your heart will be cooled to slow or stop the heartbeat, allowing the damaged valve to be removed and replaced with the new valve. The smaller incision will then be closed. If your condition allows for this procedure, there is often less blood loss, less pain, and faster recovery compared to traditional open surgery.  AFTER THE PROCEDURE You will be monitored closely in a recovery area. From there, you will likely go to an intensive care unit.  Document Released: 12/22/2004 Document Revised: 12/17/2013 Document Reviewed: 01/09/2013 [facility] Patient Information 2015 [place], [place]. This information is not intended to replace advice given to you by your health care provider. Make sure you discuss any questions you have with your health care provider.  No driving for 24 hours. No lifting over 5 lbs for 1 week. No sexual activity for 1 week. Keep procedure site clean & dry. If you notice increased pain, swelling, bleeding or pus, call/return!  You should hold off shower for  3-4 days and no soaking baths/hot tubs/pools for 1 week. Keep right groin area clean and dry. Contact cardiology if any sign of infection or redness spreading out from surgical area.

## 2015-03-27 NOTE — Progress Notes (Signed)
      [address]       [place] [address]             [phone]        2 Days Post-Op Procedure(s) (LRB): TRANSCATHETER AORTIC VALVE REPLACEMENT, TRANSFEMORAL (N/A) TRANSESOPHAGEAL ECHOCARDIOGRAM (TEE) (N/A)  Subjective: Patient without complaints this am  Objective: Vital signs in last 24 hours: Temp:  [97.7 F (36.5 C)-98.3 F (36.8 C)] 98.3 F (36.8 C) (08/11 0541) Pulse Rate:  [54-75] 63 (08/11 0541) Cardiac Rhythm:  [-] Sinus bradycardia (08/10 2017) Resp:  [12-23] 17 (08/11 0541) BP: (109-144)/(45-74) 141/61 mmHg (08/11 0541) SpO2:  [90 %-97 %] 95 % (08/11 0541) Weight:  [213 lb 3.2 oz (96.707 kg)] 213 lb 3.2 oz (96.707 kg) (08/11 0541)  Current Weight  03/27/15 213 lb 3.2 oz (96.707 kg)      Intake/Output from previous day: 08/10 0701 - 08/11 0700 In: 230 [P.O.:120; I.V.:110] Out: 1005 [Urine:1005]   Physical Exam:  Cardiovascular: RRR, systolic murmur Pulmonary: Clear to auscultation bilaterally; no rales, wheezes, or rhonchi. Abdomen: Soft, non tender, bowel sounds present. Extremities: No lower extremity edema. Wounds: Clean and dry.  No erythema or signs of infection.  Lab Results: CBC: Recent Labs  03/26/15 0455 03/27/15 0527  WBC 8.1 7.6  HGB 12.8* 13.1  HCT 38.1* 39.7  PLT 121* 133*   BMET:  Recent Labs  03/26/15 0455 03/27/15 0527  NA 139 140  K 4.3 4.0  CL 107 107  CO2 26 26  GLUCOSE 126* 100*  BUN 14 12  CREATININE 0.94 0.93  CALCIUM 8.6* 9.0    PT/INR:  Lab Results  Component Value Date   INR 1.31 03/25/2015   INR 1.09 03/21/2015   INR 1.05 02/20/2015   ABG:  INR: Will add last result for INR, ABG once components are confirmed Will add last 4 CBG results once components are confirmed  Assessment/Plan:  1. CV - SR in the 60's. On Lopressor 12.5 mg bid, ecasa 81 mg daily, and Plavix 75 mg daily. 2. Per cardiology, stable for discharge.     Margie Brink MPA-C 03/27/2015,7:45  AM

## 2015-03-27 NOTE — Progress Notes (Signed)
SUBJECTIVE: No chest pain or SOB. No events.   BP 141/61 mmHg  Pulse 63  Temp(Src) 98.3 F (36.8 C) (Oral)  Resp 17  Ht 5\' 9"  (1.753 m)  Wt 213 lb 3.2 oz (96.707 kg)  BMI 31.47 kg/m2  SpO2 95%  Intake/Output Summary (Last 24 hours) at 03/27/15 0704 Last data filed at 03/27/15 0400  Gross per 24 hour  Intake    230 ml  Output   1005 ml  Net   -775 ml    PHYSICAL EXAM General: Well developed, well nourished, in no acute distress. Alert and oriented x 3.  Psych:  Good affect, responds appropriately Neck: No JVD. No masses noted.  Lungs: Clear bilaterally with no wheezes or rhonci noted.  Heart: RRR with systolic murmur noted. Abdomen: Bowel sounds are present. Soft, non-tender.  Extremities: No lower extremity edema.   LABS: Basic Metabolic Panel:  Recent Labs  03/26/15 0455 03/27/15 0527  NA 139 140  K 4.3 4.0  CL 107 107  CO2 26 26  GLUCOSE 126* 100*  BUN 14 12  CREATININE 0.94 0.93  CALCIUM 8.6* 9.0  MG 1.9  --    CBC:  Recent Labs  03/26/15 0455 03/27/15 0527  WBC 8.1 7.6  HGB 12.8* 13.1  HCT 38.1* 39.7  MCV 89.4 89.6  PLT 121* 133*    Current Meds: . sodium chloride   Intravenous Once  . acetaminophen  1,000 mg Oral 4 times per day   Or  . acetaminophen (TYLENOL) oral liquid 160 mg/5 mL  1,000 mg Per Tube 4 times per day  . antiseptic oral rinse  7 mL Mouth Rinse BID  . aspirin EC  81 mg Oral Daily  . clopidogrel  75 mg Oral q morning - 10a  . docusate sodium  200 mg Oral q morning - 10a  . insulin aspart  0-24 Units Subcutaneous 6 times per day  . pantoprazole  40 mg Oral Daily  . pravastatin  10 mg Oral QHS   Echo 03/26/15: Left ventricle: The cavity size was normal. There was moderate concentric hypertrophy and moderate to severe basal septal hypertrophy. Systolic function was hyperdynamic. The estimated ejection fraction was in the range of 70% to 75%. There was dynamic obstruction at restin the mid cavity, with a  peak velocity of 3.5 cm/sec and a peak gradient of 0 mm Hg. Wall motion was normal; there were no regional wall motion abnormalities. Features are consistent with a pseudonormal left ventricular filling pattern, with concomitant abnormal relaxation and increased filling pressure (grade 2 diastolic dysfunction). - Aortic valve: A bioprosthesis was present and functioning normally. Pulsed Doppler inside the prosthetic valve shows a peak velocity of roughly 2 m/s (i.e peak transvalvular gradient approximately 16 mm Hg. Continuous wave velocities and derived gradients do not reflect valve stenosis or LV outflow obstruction, but rather elevated subvalvular velocities. There may be systolic anterior motion of the mitral valve causing LV outflow obstruction, but 2D image quality is poor and limits the evaluation. - Mitral valve: There was mild regurgitation. - Left atrium: The atrium was severely dilated.  ASSESSMENT AND PLAN:  1. Severe aortic valve stenosis: POD# 2 s/p TAVR. He is doing well. BP is stable. He is in sinus this am. Will continue ASA and Plavix. Echo 03/26/15 with normally functioning bioprosthetic aortic valve   2. HOCM: Will restart low dose beta blocker at home dose. His HR is in the 60s while awake, sinus.  3. Dispo: D/C home today. He has follow up arranged already with Dr. Cyndia Bent 04/09/15 at 1:30 pm. He will need f/u with me in 1 month (has to be me) with echo same day before his appt with me. He will need instructions for femoral wound care.   Robert Anderson  8/11/20167:04 AM

## 2015-03-27 NOTE — Discharge Summary (Signed)
Discharge Summary   Patient ID: Robert Anderson,  MRN: [id number], DOB/AGE: 11/21/1923 81 y.o.  Admit date: 03/25/2015 Discharge date: 03/27/2015  Primary Care Provider: TODD,JEFFREY ALLEN Primary Cardiologist: Dr. Meda Coffee Valvular specialist: Dr. Burt Knack Cardiothoracic surgeon: Dr. Cyndia Bent  Discharge Diagnoses Principal Problem:   S/P TAVR (transcatheter aortic valve replacement) Active Problems:   Severe aortic valve stenosis   Allergies No Known Allergies  Procedures  TAVR 03/25/2015 by Dr. Cyndia Bent and Dr. Angelena Form  Pre-operative Echo Findings:  Severe aortic stenosis  Normal left ventricular systolic function  Post-operative Echo Findings:  Trivial paravalvular leak  Normal left ventricular systolic function  Edwards Sapien 3 THV (size 29 mm)    TTE 03/26/2015 LV EF: 70% -  75%  ------------------------------------------------------------------- Indications:   Aortic stenosis 424.1.  ------------------------------------------------------------------- History:  PMH: Hypertrophic Cardiomyopathy. Risk factors: Hypertension.  ------------------------------------------------------------------- Study Conclusions  - Left ventricle: The cavity size was normal. There was moderate concentric hypertrophy and moderate to severe basal septal hypertrophy. Systolic function was hyperdynamic. The estimated ejection fraction was in the range of 70% to 75%. There was dynamic obstruction at restin the mid cavity, with a peak velocity of 3.5 cm/sec and a peak gradient of 0 mm Hg. Wall motion was normal; there were no regional wall motion abnormalities. Features are consistent with a pseudonormal left ventricular filling pattern, with concomitant abnormal relaxation and increased filling pressure (grade 2 diastolic dysfunction). - Aortic valve: A bioprosthesis was present and functioning normally. Pulsed Doppler inside the prosthetic valve shows a  peak velocity of roughly 2 m/s (i.e peak transvalvular gradient approximately 16 mm Hg. Continuous wave velocities and derived gradients do not reflect valve stenosis or LV outflow obstruction, but rather elevated subvalvular velocities. There may be systolic anterior motion of the mitral valve causing LV outflow obstruction, but 2D image quality is poor and limits the evaluation. - Mitral valve: There was mild regurgitation. - Left atrium: The atrium was severely dilated.    Hospital Course  The patient is a 81 year old gentleman with long-standing history of aortic stenosis, HTN and HCM who was seen in the office on 03/05/2015 by Dr Roxy Manns of cardiothoracic surgery for second surgical opinion to discuss treatment options for management of severe symptomatic aortic stenosis. He was previously seen by Dr. Cyndia Bent who discussed treatment options including conventional surgical aortic valve replacement w or w/o septal myomectomy versus TAVR. Per record, he originally noted to have a heart murmur and diagnosed with hypertrophic cardiomyopathy roughly 20 years ago. He was doing well until approximately one year ago when he suffered a brief TIA. Echocardiogram done at the time showed presence of moderate to severe aortic stenosis with normal left ventricular systolic function. He was referred to Dr. Meda Coffee for cardiology evaluation. Echocardiogram performed on 12/04/2013 confirmed the presence of severe AS with mean transvalvular gradient of 34. He also demonstrated dynamic obstruction with valsalva maneuver with peak velocity increased to 4.5 m/s, severe left ventricular hypertrophy. He was later referred to Dr. 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 It was unsuccessful to cross the aortic valve to directly measure transvalvular and LV outflow tract gradient. CTA was done to evaluate for  potential possibility of TAVR. During the office visit with Dr. Roxy Manns, various option were discussed again with the patient include conventional aortic valve replacement w or w/o septal myomectomy, transcatheter aortic valve replacement, versus palliative medical therapy were compared and contrasted and strength. Per Dr.  Roxy Manns, he favors transesophageal echocardiogram to further evaluate the presence and severity of septal hypertrophy. If TEE confirms findings consistent with significant subvalvular obstruction, he would favor conventional surgery which offers ability to definitively treat LV outflow tract obstruction and avoid any potential complication related to malposition of the transcatheter aortic valve. TEE was done as outpatient on 7/22. Patient was contacted by Dr. Burt Knack over the phone on 7/28, after extensive review of the patient's case among members of the multidisciplinary heart valves team, various options and benefits and risk of TAVR versus conventional aortic valve replacement with myomectomy were discussed with patient and his wife, they strongly favor TAVR treatment option. They understood that additional procedural risk related to his severe septal hypertrophy. And understand that he may be left with a subvalvular gradient. Despite this risk, considering his advanced age and progressive functional decline, he would have a high likelihood of prolonged and complicated recovery from conventional surgery. He was scheduled for TAVR on 03/25/2015 using a 22mm Sapien 3 valve.  He underwent a scheduled procedure on 8/9 via right femoral arterial cutdown performed by Dr. Cyndia Bent. Edward Sapien 3 THV (size 25mm) was placed by Dr. Cyndia Bent and Dr. Angelena Form. Postprocedure, the balloon was inflated and then held for >3 seconds after reaching full deployment volume. Once the balloon was deflated and retracted from the ascending aorta, the valve position was assessed using TEE. There is felt to be trivial  perivalvular leak and no central aortic insufficiency. His hemodynamic recovery following valve deployment is good. He was seen in the morning of 03/27/2015, he appears to be in sinus rhythm in the morning. Echocardiogram performed on 03/26/2015 showed a normal functioning bioprosthetic aortic valve. He will continue on aspirin and Plavix. He is deemed stable for discharge from cardiology perspective. Outpatient follow-up with Dr. Cyndia Bent has already been arranged. I will arrange outpatient follow-up with Dr. Angelena Form in one month with echocardiogram done on the same day.   Discharge Vitals Blood pressure 143/63, pulse 64, temperature 98.1 F (36.7 C), temperature source Oral, resp. rate 18, height 5\' 9"  (1.753 m), weight 213 lb 3.2 oz (96.707 kg), SpO2 94 %.  Filed Weights   03/25/15 1134 03/26/15 0500 03/27/15 0541  Weight: 212 lb 15.4 oz (96.6 kg) 216 lb 14.9 oz (98.4 kg) 213 lb 3.2 oz (96.707 kg)    Labs  CBC  Recent Labs  03/26/15 0455 03/27/15 0527  WBC 8.1 7.6  HGB 12.8* 13.1  HCT 38.1* 39.7  MCV 89.4 89.6  PLT 121* 924*   Basic Metabolic Panel  Recent Labs  03/26/15 0455 03/27/15 0527  NA 139 140  K 4.3 4.0  CL 107 107  CO2 26 26  GLUCOSE 126* 100*  BUN 14 12  CREATININE 0.94 0.93  CALCIUM 8.6* 9.0  MG 1.9  --     Disposition  Pt is being discharged home today in good condition.  Follow-up Plans & Appointments      Follow-up Information    Follow up with Gaye Pollack, MD On 04/09/2015.   Specialty:  Cardiothoracic Surgery   Why:  Appointment time is at 1:30 pm   Contact information:   [address] [place] [address] [phone]       Follow up with Lauree Chandler, MD On 04/25/2015.   Specialty:  Cardiology   Why:  11:30am. Cardiology followup   Contact information:   [address] [place] [address] [phone]  Follow up with [facility] On 04/25/2015.   Why:  10:30AM. Echocardiogram. Follow by  followup with Dr. Angelena Form at 11:30am.   Contact information:   [address] [place] [address] [phone]      Discharge Medications    Medication List    TAKE these medications        aspirin EC 81 MG tablet  Take 81 mg by mouth daily.     clopidogrel 75 MG tablet  Commonly known as:  PLAVIX  Take 75 mg by mouth every morning.     docusate sodium 100 MG capsule  Commonly known as:  COLACE  Take 200 mg by mouth every morning.     metoprolol tartrate 25 MG tablet  Commonly known as:  LOPRESSOR  TAKE 1/2 TABLET TWICE A DAY     pravastatin 10 MG tablet  Commonly known as:  PRAVACHOL  Take 10 mg by mouth at bedtime.     tamsulosin 0.4 MG Caps capsule  Commonly known as:  FLOMAX  Take 0.4 mg by mouth 3 (three) times a week. Monday Wednesday Fridday        Outstanding Labs/Studies  Echocardiogram on the same day of your followup with Dr. Angelena Form  Duration of Discharge Encounter   Greater than 30 minutes including physician time.  Hilbert Corrigan PA-C Pager: [phone] 03/27/2015, 10:30 AM

## 2015-03-27 NOTE — Progress Notes (Signed)
Pt walked the hallway with no distress or SOB b4 discharge. Pt D/C home per MD order, D/C instructions reviewed with pt and wife, all questions answered. Pt aware of follow up appt., IVs removed and site looks clean and intact, Pt verbalized understanding of discharged instructions. Robert Anderson

## 2015-03-27 NOTE — Care Management Important Message (Signed)
Important Message  Patient Details  Name: Robert Anderson MRN: [id number] Date of Birth: [birth year]   Medicare Important Message Given:  N/A - LOS <3 / Initial given by admissions    Dawayne Patricia, RN 03/27/2015, 10:31 AM

## 2015-03-28 LAB — TYPE AND SCREEN
ABO/RH(D): A POS
Antibody Screen: NEGATIVE
Unit division: 0
Unit division: 0

## 2015-04-01 MED FILL — Insulin Regular (Human) Inj 100 Unit/ML: INTRAMUSCULAR | Qty: 2.5 | Status: AC

## 2015-04-09 ENCOUNTER — Ambulatory Visit (INDEPENDENT_AMBULATORY_CARE_PROVIDER_SITE_OTHER): Payer: Medicare Other | Admitting: Surgery

## 2015-04-09 ENCOUNTER — Encounter: Payer: Self-pay | Admitting: Surgery

## 2015-04-09 VITALS — BP 110/61 | HR 77 | Resp 20 | Ht 69.0 in | Wt 206.0 lb

## 2015-04-09 DIAGNOSIS — Z954 Presence of other heart-valve replacement: Secondary | ICD-10-CM | POA: Diagnosis not present

## 2015-04-09 DIAGNOSIS — I350 Nonrheumatic aortic (valve) stenosis: Secondary | ICD-10-CM | POA: Diagnosis not present

## 2015-04-09 DIAGNOSIS — Z952 Presence of prosthetic heart valve: Secondary | ICD-10-CM

## 2015-04-09 NOTE — Progress Notes (Signed)
      HPI: Patient returns for routine postoperative follow-up having undergone right transfemoral TAVR with a 29 mm Sapien 3 valve on 03/25/2015. The patient's early postoperative recovery while in the hospital was notable for an uncomplicated postop course. Since hospital discharge the patient reports that he has been feeling well. He is anxious to return to the gym. He is concerned about his metoprolol dose since he was only on 12.5 mg three days per week prior to surgery per Dr. Sherren Mocha and was sent home on 12.5 mg bid. He denies any dizziness. His stamina is improved but not back to normal.   Current Outpatient Prescriptions  Medication Sig Dispense Refill  . aspirin EC 81 MG tablet Take 81 mg by mouth daily.    . clopidogrel (PLAVIX) 75 MG tablet Take 75 mg by mouth every morning.    . docusate sodium (COLACE) 100 MG capsule Take 200 mg by mouth every morning.     . metoprolol tartrate (LOPRESSOR) 25 MG tablet TAKE 1/2 TABLET TWICE A DAY 180 tablet 1  . pravastatin (PRAVACHOL) 10 MG tablet Take 10 mg by mouth at bedtime.    . tamsulosin (FLOMAX) 0.4 MG CAPS capsule Take 0.4 mg by mouth 3 (three) times a week. Monday Wednesday Fridday     No current facility-administered medications for this visit.    Physical Exam: BP 110/61 mmHg  Pulse 77  Resp 20  Ht 5\' 9"  (1.753 m)  Wt 206 lb (93.441 kg)  BMI 30.41 kg/m2  SpO2 98% He looks well Cardiac exam shows a regular rate and rhythm with normal heart sounds. There is a 2/6 systolic murmur loudest at the left lower sternal border. There is no diastolic murmur. The right groin incision is healing well There is no peripheral edema  Diagnostic Tests:  None today  Impression:  He is doing well following TAVR. I told him that he can return to the gym to do aerobic activity on his exercise bike and elliptical. He should refrain from weight lifting at this time. I will let Dr. Angelena Form address his metoprolol dose at his next visit. His BP  and pulse are fine today and I think he would benefit from daily beta blocker due to his hypertrophic cardiomyopathy.   Plan:  He will follow up with Dr. Angelena Form in early September and will have an echo at that time.   Gaye Pollack, MD [facility] [phone]

## 2015-04-18 ENCOUNTER — Other Ambulatory Visit: Payer: Self-pay | Admitting: Neurology

## 2015-04-20 ENCOUNTER — Other Ambulatory Visit: Payer: Self-pay | Admitting: Neurology

## 2015-04-25 ENCOUNTER — Encounter: Payer: Self-pay | Admitting: Cardiovascular Disease

## 2015-04-25 ENCOUNTER — Ambulatory Visit (INDEPENDENT_AMBULATORY_CARE_PROVIDER_SITE_OTHER): Payer: Medicare Other | Admitting: Cardiovascular Disease

## 2015-04-25 ENCOUNTER — Ambulatory Visit (HOSPITAL_COMMUNITY): Payer: Medicare Other | Attending: Internal Medicine

## 2015-04-25 ENCOUNTER — Other Ambulatory Visit: Payer: Self-pay

## 2015-04-25 VITALS — BP 108/52 | HR 50 | Ht 69.0 in | Wt 260.4 lb

## 2015-04-25 DIAGNOSIS — I10 Essential (primary) hypertension: Secondary | ICD-10-CM | POA: Diagnosis not present

## 2015-04-25 DIAGNOSIS — Z954 Presence of other heart-valve replacement: Secondary | ICD-10-CM

## 2015-04-25 DIAGNOSIS — I350 Nonrheumatic aortic (valve) stenosis: Secondary | ICD-10-CM

## 2015-04-25 DIAGNOSIS — E785 Hyperlipidemia, unspecified: Secondary | ICD-10-CM | POA: Diagnosis not present

## 2015-04-25 DIAGNOSIS — I517 Cardiomegaly: Secondary | ICD-10-CM | POA: Insufficient documentation

## 2015-04-25 DIAGNOSIS — Z952 Presence of prosthetic heart valve: Secondary | ICD-10-CM | POA: Insufficient documentation

## 2015-04-25 NOTE — Patient Instructions (Signed)
Medication Instructions:  Your physician has recommended you make the following change in your medication: Stop aspirin   Labwork: none  Testing/Procedures: Your physician has requested that you have an echocardiogram. Echocardiography is a painless test that uses sound waves to create images of your heart. It provides your doctor with information about the size and shape of your heart and how well your heart's chambers and valves are working. This procedure takes approximately one hour. There are no restrictions for this procedure. To be done in 12 months on day of appt with Dr. Angelena Form.     Follow-Up: Your physician wants you to follow-up in:  12 months with Dr. Angelena Form. You will receive a reminder letter in the mail two months in advance. If you don't receive a letter, please call our office to schedule the follow-up appointment.  Your physician recommends that you schedule a follow-up appointment in: December with Dr. Tanna Furry for December 20,2016 at 11:00   Any Other Special Instructions Will Be Listed Below (If Applicable).

## 2015-04-25 NOTE — Progress Notes (Signed)
No chief complaint on file.     History of Present Illness: 81 y.o. male with history of severe aortic valve stenosis, HOCM who recently underwent TAVR on 03/25/15. who presents for evaluation of severe aortic stenosis. His TAVR was performed from the femoral artery approach and a 29 mm Sapien 3 valve was placed. His post-operative course was uneventful. He has seen Dr. Cyndia Bent in follow up and was doing well. He is here today for 30 day follow up. He continues on metoprolol 12.5 mg po BID. He is back to exercising and is feeling well with no chest pain or SOB. He is followed in our office by Dr. Meda Coffee. He has no complaints today. He is asking if he should take metoprolol twice daily.   Primary Care Physician: Todd  Last Lipid Profile:Lipid Panel     Component Value Date/Time   CHOL 188 01/22/2014 1618   TRIG 321* 01/22/2014 1618   HDL 39* 01/22/2014 1618   CHOLHDL 4.8 01/22/2014 1618   VLDL 64* 01/22/2014 1618   LDLCALC 85 01/22/2014 1618     Past Medical History  Diagnosis Date  . HYPERTENSION 05/24/2007    Qualifier: Diagnosis of  By: Scherrie Gerlach    . ERECTILE DYSFUNCTION 05/24/2007    Qualifier: Diagnosis of  By: Scherrie Gerlach    . HEARING LOSS 05/24/2007    Qualifier: Diagnosis of  By: Scherrie Gerlach    . TIA (transient ischemic attack) 01/21/2014    5 min spell aphasia without assoc sx    . CATARACTS, BILATERAL 05/24/2007    Qualifier: Diagnosis of  By: Scherrie Gerlach    . Heart murmur 01/21/2014    undefined apparently present for years    . History of prostate cancer 10/08/2010  . Aphasia 01/21/2014    see above no slurring just couldnt get words out.   Marland Kitchen Squamous cell skin cancer     left lower leg  . Severe aortic stenosis 02/04/2015  . Hypertrophic obstructive cardiomyopathy 02/01/2014  . CVA (cerebral infarction) 02/19/2014    Small infarct high posterior left frontal lobe on MRI   . Sleep apnea     NEVER WAS TESTED...DOESNT HAVE  . DEGENERATIVE JOINT DISEASE,  SPINE 05/24/2007    Qualifier: Diagnosis of  By: Scherrie Gerlach    . OA (osteoarthritis)     both hips  . 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 Prostate surgery    . Joint replacement    . Hemiarthroplasty shoulder fracture    . Cataract extraction      right 2004, left 2010  . Cardiac catheterization N/A 02/20/2015    Procedure: Right/Left Heart Cath and Coronary Angiography;  Surgeon: Sherren Mocha, MD;  Location: [facility];  Service: Cardiovascular;  Laterality: N/A;  . Tee without cardioversion N/A 03/07/2015    Procedure: TRANSESOPHAGEAL ECHOCARDIOGRAM (TEE);  Surgeon: Dorothy Spark, MD;  Location: [facility];  Service: Cardiovascular;  Laterality: N/A;  . Eye surgery    . Tonsillectomy    . Anterior cervical discectomy/fusion      10-12 yrs ago...Dr. Louanne Skye  . External radiation      2010 -- 40 treatments  . Transcatheter aortic valve replacement, transfemoral N/A 03/25/2015    Procedure: TRANSCATHETER AORTIC VALVE REPLACEMENT, TRANSFEMORAL;  Surgeon: Harrell Gave  Santina Evans, MD;  Location: [facility];  Service: Open Heart Surgery;  Laterality: N/A;  . Tee without cardioversion N/A 03/25/2015    Procedure: TRANSESOPHAGEAL ECHOCARDIOGRAM (TEE);  Surgeon: Burnell Blanks, MD;  Location: [facility];  Service: Open Heart Surgery;  Laterality: N/A;    Current Outpatient Prescriptions  Medication Sig Dispense Refill  . aspirin EC 81 MG tablet Take 81 mg by mouth daily.    . clopidogrel (PLAVIX) 75 MG tablet TAKE 1 TABLET (75 MG TOTAL) BY MOUTH DAILY. 90 tablet 2  . docusate sodium (COLACE) 100 MG capsule Take 200 mg by mouth every morning.     . metoprolol tartrate (LOPRESSOR) 25 MG tablet TAKE 1/2 TABLET TWICE A DAY 180 tablet 1  . pravastatin (PRAVACHOL) 10 MG tablet Take 10 mg by mouth at bedtime.    .  tamsulosin (FLOMAX) 0.4 MG CAPS capsule Take 0.4 mg by mouth 3 (three) times a week. Monday Wednesday Fridday     No current facility-administered medications for this visit.    No Known Allergies  Social History   Social History  . Marital Status: Married    Spouse Name: N/A  . Number of Children: N/A  . Years of Education: N/A   Occupational History  . Not on file.   Social History Main Topics  . Smoking status: Never Smoker   . Smokeless tobacco: Not on file  . Alcohol Use: No  . Drug Use: No  . Sexual Activity: No   Other Topics Concern  . Not on file   Social History Narrative    Family History  Problem Relation Age of Onset  . Heart disease Mother   . Cancer Father   . Cancer Sister   . Prostate cancer Brother   . Heart disease Brother   . Lung cancer Brother   . CVA Mother   . CVA Father     Review of Systems:  As stated in the HPI and otherwise negative.   BP 108/52 mmHg  Pulse 50  Ht 5\' 9"  (1.753 m)  Wt 260 lb 6.4 oz (118.117 kg)  BMI 38.44 kg/m2  SpO2 98%  Physical Examination: General: Well developed, well nourished, NAD HEENT: OP clear, mucus membranes moist SKIN: warm, dry. No rashes. Neuro: No focal deficits Musculoskeletal: Muscle strength 5/5 all ext Psychiatric: Mood and affect normal Neck: No JVD, no carotid bruits, no thyromegaly, no lymphadenopathy. Lungs:Clear bilaterally, no wheezes, rhonci, crackles Cardiovascular: Regular rate and rhythm. No murmurs, gallops or rubs. Abdomen:Soft. Bowel sounds present. Non-tender.  Extremities: No lower extremity edema. Pulses are 2 + in the bilateral DP/PT.  Echo 04/25/15:   EKG:  EKG is not ordered today. The ekg ordered today demonstrates   Recent Labs: 05/16/2014: TSH 0.89 03/21/2015: ALT 16* 03/26/2015: Magnesium 1.9 03/27/2015: BUN 12; Creatinine, Ser 0.93; Hemoglobin 13.1; Platelets 133*; Potassium 4.0; Sodium 140   Lipid Panel    Component Value Date/Time   CHOL 188 01/22/2014  1618   TRIG 321* 01/22/2014 1618   HDL 39* 01/22/2014 1618   CHOLHDL 4.8 01/22/2014 1618   VLDL 64* 01/22/2014 1618   LDLCALC 85 01/22/2014 1618   LDLDIRECT 134.9 05/03/2013 0951     Wt Readings from Last 3 Encounters:  04/25/15 260 lb 6.4 oz (118.117 kg)  04/09/15 206 lb (93.441 kg)  03/27/15 213 lb 3.2 oz (96.707 kg)     Other studies Reviewed: Additional studies/ records that were reviewed today include: . Review of the above records  demonstrates:    Assessment and Plan:   1. Severe aortic valve stenosis: He is s/p TAVR on 03/25/15. He has done well. Echo today shows normally functioning aortic valve prosthesis. He is NYHA class 2. Will continue Plavix for now. Will stop ASA. Will continue metoprolol 12.5 mg po BID give his HOCM. I will plan to see him back in one year for TAVR follow up. He will continue to follow with Dr. Meda Coffee.   Current medicines are reviewed at length with the patient today.  The patient does not have concerns regarding medicines.  The following changes have been made:  no change  Labs/ tests ordered today include:  No orders of the defined types were placed in this encounter.    Disposition:   FU with me in 12  months with echo. Follow up Dr. Meda Coffee 3 months.   Signed, Lauree Chandler, MD 04/25/2015 11:31 AM    [facility] [address], [place], [place]  [address] Phone: [phone]; Fax: [phone]

## 2015-05-01 ENCOUNTER — Telehealth: Payer: Self-pay | Admitting: Cardiovascular Disease

## 2015-05-01 NOTE — Telephone Encounter (Signed)
New message    Request for surgical clearance:  1. What type of surgery is being performed? Routine Cleaning  2. When is this surgery scheduled? September 20th  3. Are there any medications that need to be held prior to surgery and how long? Not sure   4. Name of physician performing surgery? Dr Lenore Cordia  5. What is your office phone and fax number? [facility] [phone]  Fax [phone]  Is pt cleared to having cleaning since he had valve replacement on August 9th?  Does pt need pre-medication? Pt is already taking 2 mg amoxicillin 1 hour before dental visit

## 2015-05-01 NOTE — Telephone Encounter (Signed)
He can use the same antibiotic prophylaxis that he uses typically. He will always need to take antibiotics before dental cleaning. Darlina Guys

## 2015-05-01 NOTE — Telephone Encounter (Signed)
**Note De-Identified  Obfuscation** This message has been manually faxed to [facility] @ [phone]. I did receive conformation that the fax went successfully.

## 2015-05-01 NOTE — Telephone Encounter (Signed)
**Note De-Identified  Obfuscation** Rosann Auerbach is advised that Dr Angelena Form and his nurse are not in the office at this time and that I am forwarding this message to them. She is advised that we will contact her with Dr Angelena Form recommendations when available.  She verbalized understanding.

## 2015-05-05 ENCOUNTER — Telehealth: Payer: Self-pay | Admitting: Cardiovascular Disease

## 2015-05-05 NOTE — Telephone Encounter (Signed)
I would advise to stop taking metoprolol and have him come to the clinic to be evaluated by one of the PAs. Thank you, KN

## 2015-05-05 NOTE — Telephone Encounter (Signed)
Informed the pt and wife that per Dr Meda Coffee she recommends that the pt stop taking metoprolol and have him come in to the clinic to see one of our Extenders for a follow-up visit for dizziness, stopping BP med, and s/p TAVR on 8/9. Informed the pt and wife that I have him scheduled to come in and see Kerin Ransom PA-C on this Thursday 05/08/15 at 1030 at our office on [address].  Both the pt and wife verbalized understanding and agrees with this plan.

## 2015-05-05 NOTE — Telephone Encounter (Signed)
New message      Pt had a TAVR procedure on 03-25-15.  He is very dizzy and has been for a couple of days. Could this be from the procedure?

## 2015-05-05 NOTE — Telephone Encounter (Signed)
Pts wife calling to obtain recommendations from Dr Meda Coffee about the pts complaints of dizziness over the course of 3 days.  Wife states that the pt has no complaints of cp, sob, palpitations, pre-syncopal or syncopal episodes.  Wife reports that this usually occurs when the pt is up ambulating.  Wife reports that they consistently monitor his BP and its remained in the 116/60s range.  Pressure doesn't go up or drop with positional changes.  Wife reports the pt had a TAVR back in 03/25/15, and has had no problems since this.  Wife reports that the pt could be pushing himself to hard, for he has increased his exercise to 5 days a week at the [facility].  Wife reports that the pt has had episodes of dizziness in the past, and was diagnosed with vertigo, but no meds were given.  Wife would like for Dr Meda Coffee to advise if the pts BP medicine could be causing this, or could this be vertigo.  Wife reports that she is encouraging the pt to drink more fluids daily, especially with increasing his activity level.  Wife reports also that she is going to be making the pt an appt with his PCP, for he needs to have his ears checked, for he is known to get wax buildup, which has caused him vertigo in the past.  He usually gets his ears irrigated for this reason.  Informed the wife that Dr Meda Coffee is out of the office today, but I will route this message to her for further review and recommendation and follow-up with the pt and wife thereafter.  Advised the wife to maintain good hydration for the pt and make sure he slowly makes positional changes.  Wife verbalized understanding and agrees with this plan.

## 2015-05-08 ENCOUNTER — Ambulatory Visit (INDEPENDENT_AMBULATORY_CARE_PROVIDER_SITE_OTHER): Payer: Medicare Other | Admitting: Cardiology

## 2015-05-08 ENCOUNTER — Encounter: Payer: Self-pay | Admitting: Cardiology

## 2015-05-08 VITALS — BP 120/70 | HR 80 | Ht 69.0 in | Wt 212.0 lb

## 2015-05-08 DIAGNOSIS — Z952 Presence of prosthetic heart valve: Secondary | ICD-10-CM

## 2015-05-08 DIAGNOSIS — I10 Essential (primary) hypertension: Secondary | ICD-10-CM

## 2015-05-08 DIAGNOSIS — Z954 Presence of other heart-valve replacement: Secondary | ICD-10-CM

## 2015-05-08 DIAGNOSIS — R42 Dizziness and giddiness: Secondary | ICD-10-CM

## 2015-05-08 DIAGNOSIS — I421 Obstructive hypertrophic cardiomyopathy: Secondary | ICD-10-CM

## 2015-05-08 DIAGNOSIS — I447 Left bundle-branch block, unspecified: Secondary | ICD-10-CM | POA: Diagnosis not present

## 2015-05-08 MED ORDER — METOPROLOL TARTRATE 25 MG PO TABS: ORAL_TABLET | ORAL | Status: DC

## 2015-05-08 NOTE — Progress Notes (Signed)
05/08/2015 [name]   07-Dec-1923  [id number]  Primary Physician TODD,JEFFREY Zenia Resides, MD Primary Cardiologist: Dr Jeananne Rama  HPI:  81 y.o. male with history of severe aortic valve stenosis, HOCM who recently underwent TAVR on 03/25/15. He was seen by Dr Julianne Handler 04/25/15 and was doing well. Since then he has had some dizziness and is seen now in [facility] for follow up.           The pt says he got up 4 days ago to use the bathroom and was "unsteady" on his feet. He denies any trouble with his speech or arm/leg weakness. He denies any nausea, diaphoresis, or palpitations. He called and was told to stop his metoprolol by Dr Meda Coffee 9/19. He says he felt better yesterday and has had no further dizziness.    Current Outpatient Prescriptions  Medication Sig Dispense Refill  . clopidogrel (PLAVIX) 75 MG tablet TAKE 1 TABLET (75 MG TOTAL) BY MOUTH DAILY. 90 tablet 2  . docusate sodium (COLACE) 100 MG capsule Take 200 mg by mouth every morning.     . pravastatin (PRAVACHOL) 10 MG tablet Take 10 mg by mouth at bedtime.    . tamsulosin (FLOMAX) 0.4 MG CAPS capsule Take 0.4 mg by mouth 3 (three) times a week. Monday Wednesday Fridday    . metoprolol tartrate (LOPRESSOR) 25 MG tablet Take 12.5mg  daily for 5 days if tolerating increase to twice daily     No current facility-administered medications for this visit.    No Known Allergies  Social History   Social History  . Marital Status: Married    Spouse Name: N/A  . Number of Children: N/A  . Years of Education: N/A   Occupational History  . Not on file.   Social History Main Topics  . Smoking status: Never Smoker   . Smokeless tobacco: Not on file  . Alcohol Use: No  . Drug Use: No  . Sexual Activity: No   Other Topics Concern  . Not on file   Social History Narrative     Review of Systems: General: negative for chills, fever, night sweats or weight changes.  Cardiovascular: negative for chest pain, dyspnea on  exertion, edema, orthopnea, palpitations, paroxysmal nocturnal dyspnea or shortness of breath Dermatological: negative for rash Respiratory: negative for cough or wheezing Urologic: negative for hematuria- hx of prostate cancer Abdominal: negative for nausea, vomiting, diarrhea, bright red blood per rectum, melena, or hematemesis Neurologic: negative for visual changes, syncope, or dizziness-CVA last years, transient aphasia All other systems reviewed and are otherwise negative except as noted above. HOH- has hearing aid   Blood pressure 120/70, pulse 80, height 5\' 9"  (1.753 m), weight 212 lb (96.163 kg).  General appearance: alert, cooperative and no distress Neck: no JVD Lungs: clear to auscultation bilaterally Heart: regular rate and rhythm Extremities: no edema  EKG NSR, 1st degree AVB, LBBB  ASSESSMENT AND PLAN:   LBBB (left bundle branch block) And 1st degree AVB  Hypertrophic obstructive cardiomyopathy Hyperdynamic LVF with LVH and grade 1 diastolic dysfunction on echo 04/25/15  S/P TAVR (transcatheter aortic valve replacement) 29 mm Edwards Sapien 3 transcatheter heart valve placed via open right transfemoral approach  Dizziness Seen as an add on in [facility] for recent dizziness.  CVA (cerebral infarction) Small infarct high posterior left frontal lobe on MRI June 2015    PLAN: I'm not sure the etiology of his dizziness. I was initially concerned about possible posterior circulation  stroke but his symptoms resolved in 48 hrs. He has never had vertigo and denies any recent URI. He does have conduction disease by EKG. His B/P at is not low- 165-790 systolic. The pt did express some frustration, getting multiple recommendations from several different providers about medication dosing and B/P recommendations..  Dr Reatha Armour note indicated he wanted the pt on low dose beta blocker. I suggested Mr Bulman try resuming his Metoprolol in a few days at 12.5 mg daily. If he  tolerates this for a few days he can increase this to 12.5 mg BID. F/U with Dr Meda Coffee is scheduled. If he has recurrent dizziness on Metoprolol this will need to be discontinued and we will need to discuss with Dr Meda Coffee if we should try another beta blocker (? Bystolic) vs a Ca++ blocker. (LVH and diastolic dysfunction on echo).   Kerin Ransom K PA-C 05/08/2015 11:17 AM

## 2015-05-08 NOTE — Patient Instructions (Signed)
Medication Instructions:  RESUME Metoprolol 12.5mg  daily for 5 days. If tolerating then INCREASE Metoprolol to 12.5mg  twice daily  Labwork: None ordered  Testing/Procedures: None ordered  Follow-Up: Follow up as planned with Dr.Nelson in December 2016  Any Other Special Instructions Will Be Listed Below (If Applicable).

## 2015-05-08 NOTE — Assessment & Plan Note (Signed)
Small infarct high posterior left frontal lobe on MRI June 2015

## 2015-05-08 NOTE — Assessment & Plan Note (Signed)
Hyperdynamic LVF with LVH and grade 1 diastolic dysfunction on echo 04/25/15

## 2015-05-08 NOTE — Assessment & Plan Note (Signed)
Seen as an add on in [facility] for recent dizziness.

## 2015-05-08 NOTE — Assessment & Plan Note (Signed)
And 1st degree AVB

## 2015-05-08 NOTE — Assessment & Plan Note (Signed)
29 mm Edwards Sapien 3 transcatheter heart valve placed via open right transfemoral approach

## 2015-05-20 ENCOUNTER — Other Ambulatory Visit (INDEPENDENT_AMBULATORY_CARE_PROVIDER_SITE_OTHER): Payer: Medicare Other

## 2015-05-20 DIAGNOSIS — I10 Essential (primary) hypertension: Secondary | ICD-10-CM

## 2015-05-20 DIAGNOSIS — Z0000 Encounter for general adult medical examination without abnormal findings: Secondary | ICD-10-CM

## 2015-05-20 LAB — CBC WITH DIFFERENTIAL/PLATELET
BASOS PCT: 0.5 % (ref 0.0–3.0)
Basophils Absolute: 0 10*3/uL (ref 0.0–0.1)
EOS PCT: 2.4 % (ref 0.0–5.0)
Eosinophils Absolute: 0.1 10*3/uL (ref 0.0–0.7)
HCT: 46.3 % (ref 39.0–52.0)
HEMOGLOBIN: 15.2 g/dL (ref 13.0–17.0)
Lymphocytes Relative: 25.7 % (ref 12.0–46.0)
Lymphs Abs: 1.4 10*3/uL (ref 0.7–4.0)
MCHC: 32.9 g/dL (ref 30.0–36.0)
MCV: 89.9 fl (ref 78.0–100.0)
MONOS PCT: 7.7 % (ref 3.0–12.0)
Monocytes Absolute: 0.4 10*3/uL (ref 0.1–1.0)
Neutro Abs: 3.6 10*3/uL (ref 1.4–7.7)
Neutrophils Relative %: 63.7 % (ref 43.0–77.0)
Platelets: 192 10*3/uL (ref 150.0–400.0)
RBC: 5.15 Mil/uL (ref 4.22–5.81)
RDW: 13.7 % (ref 11.5–15.5)
WBC: 5.7 10*3/uL (ref 4.0–10.5)

## 2015-05-20 LAB — POCT URINALYSIS DIPSTICK
BILIRUBIN UA: NEGATIVE
GLUCOSE UA: NEGATIVE
KETONES UA: NEGATIVE
Leukocytes, UA: NEGATIVE
Nitrite, UA: NEGATIVE
PH UA: 6.5
Protein, UA: NEGATIVE
RBC UA: NEGATIVE
Spec Grav, UA: 1.025
Urobilinogen, UA: 0.2

## 2015-05-20 LAB — BASIC METABOLIC PANEL
BUN: 21 mg/dL (ref 6–23)
CHLORIDE: 106 meq/L (ref 96–112)
CO2: 31 mEq/L (ref 19–32)
Calcium: 9.9 mg/dL (ref 8.4–10.5)
Creatinine, Ser: 0.94 mg/dL (ref 0.40–1.50)
GFR: 80.34 mL/min (ref >60.00)
GLUCOSE: 107 mg/dL — AB (ref 70–99)
POTASSIUM: 4.3 meq/L (ref 3.5–5.1)
SODIUM: 143 meq/L (ref 135–145)

## 2015-05-20 LAB — HEPATIC FUNCTION PANEL
ALBUMIN: 4 g/dL (ref 3.5–5.2)
ALT: 14 U/L (ref 0–53)
AST: 15 U/L (ref 0–37)
Alkaline Phosphatase: 65 U/L (ref 39–117)
Bilirubin, Direct: 0.1 mg/dL (ref 0.0–0.3)
TOTAL PROTEIN: 6.4 g/dL (ref 6.0–8.3)
Total Bilirubin: 0.6 mg/dL (ref 0.2–1.2)

## 2015-05-20 LAB — LIPID PANEL
CHOLESTEROL: 158 mg/dL (ref 0–200)
HDL: 45.1 mg/dL (ref >39.00)
LDL Cholesterol: 80 mg/dL (ref 0–99)
NonHDL: 112.5
Total CHOL/HDL Ratio: 3
Triglycerides: 161 mg/dL — ABNORMAL HIGH (ref 0.0–149.0)
VLDL: 32.2 mg/dL (ref 0.0–40.0)

## 2015-05-20 LAB — TSH: TSH: 2.68 u[IU]/mL (ref 0.35–4.50)

## 2015-05-20 LAB — PSA: PSA: 0.07 ng/mL — ABNORMAL LOW (ref 0.10–4.00)

## 2015-05-27 ENCOUNTER — Encounter: Payer: Self-pay | Admitting: Family Medicine

## 2015-05-27 ENCOUNTER — Ambulatory Visit (INDEPENDENT_AMBULATORY_CARE_PROVIDER_SITE_OTHER): Payer: Medicare Other | Admitting: Family Medicine

## 2015-05-27 VITALS — BP 128/80 | HR 90 | Temp 98.7°F | Ht 69.0 in | Wt 213.0 lb

## 2015-05-27 DIAGNOSIS — Z23 Encounter for immunization: Secondary | ICD-10-CM

## 2015-05-27 DIAGNOSIS — I10 Essential (primary) hypertension: Secondary | ICD-10-CM

## 2015-05-27 DIAGNOSIS — I421 Obstructive hypertrophic cardiomyopathy: Secondary | ICD-10-CM

## 2015-05-27 DIAGNOSIS — H9193 Unspecified hearing loss, bilateral: Secondary | ICD-10-CM

## 2015-05-27 DIAGNOSIS — Z8546 Personal history of malignant neoplasm of prostate: Secondary | ICD-10-CM

## 2015-05-27 DIAGNOSIS — Z0000 Encounter for general adult medical examination without abnormal findings: Secondary | ICD-10-CM

## 2015-05-27 DIAGNOSIS — E785 Hyperlipidemia, unspecified: Secondary | ICD-10-CM | POA: Diagnosis not present

## 2015-05-27 MED ORDER — PRAVASTATIN SODIUM 10 MG PO TABS: 10.0000 mg | ORAL_TABLET | Freq: Every day | ORAL | Status: DC

## 2015-05-27 MED ORDER — METOPROLOL TARTRATE 25 MG PO TABS: ORAL_TABLET | ORAL | Status: DC

## 2015-05-27 MED ORDER — TAMSULOSIN HCL 0.4 MG PO CAPS: 0.4000 mg | ORAL_CAPSULE | ORAL | Status: DC

## 2015-05-27 MED ORDER — CLOPIDOGREL BISULFATE 75 MG PO TABS: ORAL_TABLET | ORAL | Status: DC

## 2015-05-27 NOTE — Patient Instructions (Signed)
Continue current medications  Continue diet and exercise program  Follow-up in one year sooner if any problems.......... Cory nafzinger..........Marland Kitchen our new adult nurse practitioner from day.

## 2015-05-27 NOTE — Progress Notes (Signed)
   Subjective:    Patient ID: Robert Anderson, male    DOB: 11-Jun-1924, 81 y.o.   MRN: [id number]  HPI Robert Anderson is an 81 year old married male nonsmoker who comes in today for general physical evaluation because of a history of hypertension, cardiomyopathy, hyperlipidemia, status post prostate cancer, history of aortic valve replacement  He states overall he is doing well. He goes to the [facility] about for 5 days per week.  He gets routine eye care, dental care, colonoscopies no longer indicated  Vaccinations updated today. He was given a flu shot and a tetanus booster.  Cognitive function normal home health safety reviewed no issues identified, no guns in the house, he does have a healthcare power of attorney and living well  Med list reviewed it's been unchanged except his cardiologist increased his beta blocker from 12.5 mg daily to 12.5 mg twice a day.   Review of Systems  Constitutional: Negative.   HENT: Negative.   Eyes: Negative.   Respiratory: Negative.   Cardiovascular: Negative.   Gastrointestinal: Negative.   Endocrine: Negative.   Genitourinary: Negative.   Musculoskeletal: Negative.   Skin: Negative.   Allergic/Immunologic: Negative.   Neurological: Negative.   Hematological: Negative.   Psychiatric/Behavioral: Negative.        Objective:   Physical Exam  Constitutional: He is oriented to person, place, and time. He appears well-developed and well-nourished.  HENT:  Head: Normocephalic and atraumatic.  Right Ear: External ear normal.  Left Ear: External ear normal.  Nose: Nose normal.  Mouth/Throat: Oropharynx is clear and moist.  Eyes: Conjunctivae and EOM are normal. Pupils are equal, round, and reactive to light.  Neck: Normal range of motion. Neck supple. No JVD present. No tracheal deviation present. No thyromegaly present.  Cardiovascular: Normal rate, regular rhythm, normal heart sounds and intact distal pulses.  Exam reveals no gallop and no friction rub.     No murmur heard. No carotid nor aortic bruits  Pulmonary/Chest: Effort normal and breath sounds normal. No stridor. No respiratory distress. He has no wheezes. He has no rales. He exhibits no tenderness.  Abdominal: Soft. Bowel sounds are normal. He exhibits no distension and no mass. There is no tenderness. There is no rebound and no guarding.  Musculoskeletal: Normal range of motion. He exhibits no edema or tenderness.  Fungal infection of all his toes with a lot of deformity. Advised to see a podiatrist  Lymphadenopathy:    He has no cervical adenopathy.  Neurological: He is alert and oriented to person, place, and time. He has normal reflexes. No cranial nerve deficit. He exhibits normal muscle tone.  Skin: Skin is warm and dry. No rash noted. No erythema. No pallor.  Psychiatric: He has a normal mood and affect. His behavior is normal. Judgment and thought content normal.  Nursing note and vitals reviewed.         Assessment & Plan:  . Healthy male  History of aortic valve replacement continue Plavix  Cardiomyopathy continue Lopressor 12.5 mg twice a day  Hyperlipidemia continue Pravachol 10 mg daily  History of prostate cancer on Flomax 0.53 times weekly  Severe hearing loss,,,,,,,, continue bilateral hearing aids

## 2015-05-29 DIAGNOSIS — H6122 Impacted cerumen, left ear: Secondary | ICD-10-CM | POA: Diagnosis not present

## 2015-05-29 DIAGNOSIS — H903 Sensorineural hearing loss, bilateral: Secondary | ICD-10-CM | POA: Diagnosis not present

## 2015-05-30 ENCOUNTER — Telehealth: Payer: Self-pay | Admitting: Cardiology

## 2015-05-30 NOTE — Telephone Encounter (Signed)
Courtney From [facility] calling to confirm if the pt has a history of Heart Failure.  Spoke with medical records, and its ok to give Courtney at [facility] this information.  Informed Loma Sousa that there is no diagnosis of Heart Failure noted in the pts chart.  Courtney RN with [facility] verbalized understanding and gracious for all the assistance provided.

## 2015-05-30 NOTE — Telephone Encounter (Signed)
New Message    [facility] calling to speak to you about patient care

## 2015-06-16 ENCOUNTER — Telehealth: Payer: Self-pay | Admitting: Cardiovascular Disease

## 2015-06-16 DIAGNOSIS — R42 Dizziness and giddiness: Secondary | ICD-10-CM

## 2015-06-16 NOTE — Telephone Encounter (Signed)
Robert Anderson, He is a patient of Meda Coffee. I performed TAVR a few months back. I will route this to Dr. Meda Coffee. He has HOCM and that is why he is on the beta blocker. He may need to be added on to see Dr. Meda Coffee this week or an office APP. chris

## 2015-06-16 NOTE — Telephone Encounter (Signed)
Yesterday, the patient stood up, became dizzy, and the dizziness has still not resolved.  He got up to use the bathroom last night, fell, and endured a "pretty bad skin tear" on his L arm and bruised the top of his hand.  Yesterday, his BP was 120/50. No HR available. Patient's wife st he is still dizzy now.  Patient is currently taking lower dose of Metoprolol. He is taking 1/2 tablet (12.5 mg) daily for now (he was instructed to decrease to 1/2 tablet daily for 5 days before increasing to 1/2 tablet BID by Dr. Sherren Mocha). Encouraged patient to stay hydrated and move slowly, especially when moving from lying and sitting to standing.  Reviewed basic first aid instructions for skin tear. Informed patient's wife that Dr. Angelena Form is not in the office today, but they will be called with further recommendations as soon as possible.

## 2015-06-16 NOTE — Telephone Encounter (Signed)
New message   Wife calling     Pt C/O Syncope: STAT if syncope occurred within 30 minutes and pt complains of lightheadedness High Priority if episode of passing out, completely, today or in last 24 hours   1. Did you pass out today? No   2. When is the last time you passed out? Dizziness  on yesterday unable to walk - wife states he has to hold on items to walk   3. Has this occurred multiple times? Since yesterday    4. Did you have any symptoms prior to passing out? Very lightheaded , fell last night

## 2015-06-17 NOTE — Telephone Encounter (Signed)
I agree with hydration, I would bring him to the clinic and check CMP and CBC, he has HOCM and should be on a BB unless very bradycardic hypotensive. I dont think I am in the clinic for the rest of the week, please have one of APP to see him.

## 2015-06-17 NOTE — Telephone Encounter (Signed)
OV scheduled with Melina Copa tomorrow at [phone]. Patient understands he will have blood work drawn at this time.

## 2015-06-18 ENCOUNTER — Other Ambulatory Visit (INDEPENDENT_AMBULATORY_CARE_PROVIDER_SITE_OTHER): Payer: Medicare Other | Admitting: *Deleted

## 2015-06-18 ENCOUNTER — Ambulatory Visit (INDEPENDENT_AMBULATORY_CARE_PROVIDER_SITE_OTHER): Payer: Medicare Other | Admitting: Physician Assistant

## 2015-06-18 ENCOUNTER — Encounter: Payer: Self-pay | Admitting: Physician Assistant

## 2015-06-18 VITALS — BP 90/50 | HR 40 | Ht 69.0 in | Wt 216.0 lb

## 2015-06-18 DIAGNOSIS — I350 Nonrheumatic aortic (valve) stenosis: Secondary | ICD-10-CM

## 2015-06-18 DIAGNOSIS — I959 Hypotension, unspecified: Secondary | ICD-10-CM

## 2015-06-18 DIAGNOSIS — R42 Dizziness and giddiness: Secondary | ICD-10-CM | POA: Diagnosis not present

## 2015-06-18 DIAGNOSIS — I421 Obstructive hypertrophic cardiomyopathy: Secondary | ICD-10-CM

## 2015-06-18 DIAGNOSIS — R001 Bradycardia, unspecified: Secondary | ICD-10-CM

## 2015-06-18 DIAGNOSIS — I10 Essential (primary) hypertension: Secondary | ICD-10-CM | POA: Diagnosis not present

## 2015-06-18 DIAGNOSIS — I459 Conduction disorder, unspecified: Secondary | ICD-10-CM

## 2015-06-18 DIAGNOSIS — Z952 Presence of prosthetic heart valve: Secondary | ICD-10-CM

## 2015-06-18 DIAGNOSIS — Z954 Presence of other heart-valve replacement: Secondary | ICD-10-CM

## 2015-06-18 LAB — COMPREHENSIVE METABOLIC PANEL
ALT: 13 U/L (ref 9–46)
AST: 13 U/L (ref 10–35)
Albumin: 3.9 g/dL (ref 3.6–5.1)
Alkaline Phosphatase: 69 U/L (ref 40–115)
BUN: 25 mg/dL (ref 7–25)
CO2: 24 mmol/L (ref 20–31)
Calcium: 9.4 mg/dL (ref 8.6–10.3)
Chloride: 107 mmol/L (ref 98–110)
Creat: 1.24 mg/dL — ABNORMAL HIGH (ref 0.70–1.11)
Glucose, Bld: 97 mg/dL (ref 65–99)
Potassium: 4.6 mmol/L (ref 3.5–5.3)
Sodium: 138 mmol/L (ref 135–146)
Total Bilirubin: 0.5 mg/dL (ref 0.2–1.2)
Total Protein: 6.4 g/dL (ref 6.1–8.1)

## 2015-06-18 LAB — CBC WITH DIFFERENTIAL/PLATELET
Basophils Absolute: 0 10*3/uL (ref 0.0–0.1)
Basophils Relative: 0 % (ref 0–1)
Eosinophils Absolute: 0.1 10*3/uL (ref 0.0–0.7)
Eosinophils Relative: 1 % (ref 0–5)
HCT: 42.8 % (ref 39.0–52.0)
Hemoglobin: 14.2 g/dL (ref 13.0–17.0)
Lymphocytes Relative: 21 % (ref 12–46)
Lymphs Abs: 1.9 10*3/uL (ref 0.7–4.0)
MCH: 29.7 pg (ref 26.0–34.0)
MCHC: 33.2 g/dL (ref 30.0–36.0)
MCV: 89.5 fL (ref 78.0–100.0)
MPV: 10.4 fL (ref 8.6–12.4)
Monocytes Absolute: 0.9 10*3/uL (ref 0.1–1.0)
Monocytes Relative: 10 % (ref 3–12)
Neutro Abs: 6.2 10*3/uL (ref 1.7–7.7)
Neutrophils Relative %: 68 % (ref 43–77)
Platelets: 189 10*3/uL (ref 150–400)
RBC: 4.78 MIL/uL (ref 4.22–5.81)
RDW: 13.4 % (ref 11.5–15.5)
WBC: 9.1 10*3/uL (ref 4.0–10.5)

## 2015-06-18 NOTE — Progress Notes (Signed)
Cardiology Office Note Date:  06/18/2015  Patient ID:  Robert Anderson, DOB 12-15-1923, MRN [id number] PCP:  Joycelyn Man, MD  Cardiologist: Randon Goldsmith for TAVR)  Chief Complaint: dizziness  History of Present Illness: Robert Anderson is a 81 y.o. male with history of severe AS s/p TAVR 03/25/15, HOCM, LBBB, 1st degree AVB, essential HTN, h/o TIA & CVA who presents for follow-up. LHC 02/2015: minor nonobstructive CAD. 2D echo 04/25/15: vigorous LV systolic function with near-cavity obliteration, moderate LVH, EF 65-70%, grade 1 DD, mod LAE. He was seen for dizziness on 05/08/15 at which time his metoprolol was re-introduced given HOCM. He was advised to take 1/2 tablet daily for several days and if tolerated, increase to BID. He did so and initially felt OK. However, on Sunday 06/15/15 he was walking to the bathroom and suddenly felt worsening dizziness with blackened vision. He fell, striking his arm on the door frame. He is not sure if he lost consciousness. His wife came to his side right away and he was oriented. He decided to drop his metporolol back to once per day. Since that time he's had continued intermittent dizziness and sustained another fall yesterday where he just suddenly felt weak. He did not have syncope at that time. He denies any head injury with either event. He endorses continued DOE. No chest pain. No bleeding reported. HR in clinic today was 34 by EKG - f/u readings 40 by manual pulse check. Surprisingly he says he feels better today and has not had any dizziness or syncope.  TSH WNL just a few weeks ago. Dr. Meda Coffee has ordered CMET and CBC for today via phone note.  Orthostatics reveal: Lying P 40, BP 90/50 Sitting P 40, BP 80/40 Standing 83m P 40, BP 80/40 Standing 38m P 43, BP 90/40  Past Medical History  Diagnosis Date  . Essential hypertension   . ERECTILE DYSFUNCTION   . HEARING LOSS   . TIA (transient ischemic attack) 01/21/2014    5 min spell aphasia without assoc  sx    . CATARACTS, BILATERAL   . History of prostate cancer 10/08/2010  . Squamous cell skin cancer     left lower leg  . Severe aortic stenosis     a. s/p TAVR 03/2015. Pre-op  LHC 02/2015: minor nonobstructive CAD.  Marland Kitchen Hypertrophic obstructive cardiomyopathy ([facility])   . CVA (cerebral infarction) 02/19/2014    Small infarct high posterior left frontal lobe on MRI   . DEGENERATIVE JOINT DISEASE, SPINE   . OA (osteoarthritis)     both hips  . 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 LBBB (left bundle branch block)   . First degree AV block     Past Surgical History  Procedure Laterality Date  . Prostate surgery    . Joint replacement    . Hemiarthroplasty shoulder fracture    . Cataract extraction      right 2004, left 2010  . Cardiac catheterization N/A 02/20/2015    Procedure: Right/Left Heart Cath and Coronary Angiography;  Surgeon: Sherren Mocha, MD;  Location: [facility];  Service: Cardiovascular;  Laterality: N/A;  . Tee without cardioversion N/A 03/07/2015    Procedure: TRANSESOPHAGEAL ECHOCARDIOGRAM (TEE);  Surgeon: Dorothy Spark, MD;  Location: [facility];  Service: Cardiovascular;  Laterality: N/A;  . Eye surgery    . Tonsillectomy    .  Anterior cervical discectomy/fusion      10-12 yrs ago...Dr. Louanne Skye  . External radiation      2010 -- 40 treatments  . Transcatheter aortic valve replacement, transfemoral N/A 03/25/2015    Procedure: TRANSCATHETER AORTIC VALVE REPLACEMENT, TRANSFEMORAL;  Surgeon: Burnell Blanks, MD;  Location: [facility];  Service: Open Heart Surgery;  Laterality: N/A;  . Tee without cardioversion N/A 03/25/2015    Procedure: TRANSESOPHAGEAL ECHOCARDIOGRAM (TEE);  Surgeon: Burnell Blanks, MD;  [place] [facility];  Service: Open Heart Surgery;  Laterality: N/A;    Current Outpatient Prescriptions  Medication Sig Dispense  Refill  . clopidogrel (PLAVIX) 75 MG tablet TAKE 1 TABLET (75 MG TOTAL) BY MOUTH DAILY. 90 tablet 3  . docusate sodium (COLACE) 100 MG capsule Take 200 mg by mouth every morning.     . metoprolol tartrate (LOPRESSOR) 25 MG tablet Take one half tablet twice daily (Patient taking differently: Take 25 mg by mouth. Take one half tablet twice daily.  PATIENT IS TAKING 1/2  TAB M-T-W DAILY THIS WEEK ONLY  HIMSELF) 100 tablet 3  . pravastatin (PRAVACHOL) 10 MG tablet Take 1 tablet (10 mg total) by mouth at bedtime. 100 tablet 3  . tamsulosin (FLOMAX) 0.4 MG CAPS capsule Take 1 capsule (0.4 mg total) by mouth 3 (three) times a week. Monday Wednesday Fridday 100 capsule 3   No current facility-administered medications for this visit.    Allergies:   Review of patient's allergies indicates no known allergies.   Social History:  The patient  reports that he has never smoked. He does not have any smokeless tobacco history on file. He reports that he does not drink alcohol or use illicit drugs.   Family History:  The patient's family history includes CVA in his father and mother; Cancer in his father and sister; Heart attack in his brother; Heart disease in his brother and mother; Lung cancer in his brother; Prostate cancer in his brother; Stroke in his mother. There is no history of Hypertension.  ROS:  Please see the history of present illness.  All other systems are reviewed and otherwise negative.   PHYSICAL EXAM:  VS:  BP 90/50 mmHg  Pulse 40  Ht 5\' 9"  (1.753 m)  Wt 216 lb (97.977 kg)  BMI 31.88 kg/m2 BMI: Body mass index is 31.88 kg/(m^2). Well nourished, well developed WM, in no acute distress HEENT: normocephalic, atraumatic Neck: no JVD, carotid bruits or masses Cardiac:  Marked sinus bradycardia with 2/6 SEM Lungs:  clear to auscultation bilaterally, no wheezing, rhonchi or rales Abd: soft, nontender, no hepatomegaly, + BS MS: no deformity or atrophy Ext: chronic skin thickening but no  edema Skin: warm and dry, no rash Neuro:  moves all extremities spontaneously, no focal abnormalities noted, follows commands Psych: euthymic mood, full affect   EKG:  Done today shows sinus bradycardia with 2:1 heart block, 34bpm, NSIVCD, nonspecific ST-T changes  Recent Labs: 03/26/2015: Magnesium 1.9 05/20/2015: ALT 14; BUN 21; Creatinine, Ser 0.94; Hemoglobin 15.2; Platelets 192.0; Potassium 4.3; Sodium 143; TSH 2.68  05/20/2015: Cholesterol 158; HDL 45.10; LDL Cholesterol 80; Total CHOL/HDL Ratio 3; Triglycerides 161.0*; VLDL 32.2   CrCl cannot be calculated (Patient has no serum creatinine result on file.).   Wt Readings from Last 3 Encounters:  06/18/15 216 lb (97.977 kg)  05/27/15 213 lb (96.616 kg)  05/08/15 212 lb (96.163 kg)     Other studies reviewed: Additional studies/records reviewed today include: summarized above  ASSESSMENT AND PLAN:  1. Dizziness with 2:1 heart block today - he has sustained 2 falls in the last 4 days. Suspect that his significant bradycardia and hypotension were playing a role. I discussed his case with Dr. Lovena Le given HR 30s-40s in clinic today. Surprisingly the patient says he feels better today. He was not symptomatic during orthostatics. Per d/w MD, will stop metoprolol and have Mr. Willemsen return on Monday to clinic for recheck EKG and blood pressure. It's hard to know if his original dizziness was due to periodic bradycardia or due to his HOCM/near-cavity obliteration. His BP and HR now preclude traditional therapies for HOCM (i.e. verapamil, BB therapy). I recommended supportive care with increased fluid intake - he admits he hardly drinks anything and his wife is constantly on him to stay hydrated. He borrowed a walker from a friend. We will prescribe a rolling walker for him. He was instructed not to drive until further notice. He will obtain the labs that were ordered by Dr. Meda Coffee. Event monitoring may be helpful in the future, depending on how  things go at f/u appt. Warning precautions reviewed. 2. Essential HTN, now hypotension - follow BP with discontinuation of metoprolol. 3. HOCM - see above. 4. Severe AS s/p TAVR - echo in 04/2015 was stable. Maintained on Plavix.  Disposition: F/u in clinic on Monday (flex clinic appropriate).  Current medicines are reviewed at length with the patient today.  The patient did not have any concerns regarding medicines.  Raechel Ache PA-C 06/18/2015 4:11 PM     [facility] [place] [address] [phone] (office)  [phone] (fax)

## 2015-06-18 NOTE — Patient Instructions (Signed)
Medication Instructions:  Your physician has recommended you make the following change in your medication:  1-STOP Metoprolol  Labwork: NONE  Testing/Procedures: NONE  Follow-Up: Your physician recommends that you schedule a follow-up appointment on Monday. FLEX schedule is okay to schedule on if need be.   Any Other Special Instructions Will Be Listed Below (If Applicable).     If you need a refill on your cardiac medications before your next appointment, please call your pharmacy.

## 2015-06-19 ENCOUNTER — Encounter: Payer: Self-pay | Admitting: Family Medicine

## 2015-06-19 ENCOUNTER — Ambulatory Visit (INDEPENDENT_AMBULATORY_CARE_PROVIDER_SITE_OTHER): Payer: Medicare Other | Admitting: Family Medicine

## 2015-06-19 VITALS — BP 118/56 | HR 44 | Temp 97.4°F | Ht 69.0 in

## 2015-06-19 DIAGNOSIS — R001 Bradycardia, unspecified: Secondary | ICD-10-CM

## 2015-06-19 DIAGNOSIS — I421 Obstructive hypertrophic cardiomyopathy: Secondary | ICD-10-CM

## 2015-06-19 DIAGNOSIS — S51812A Laceration without foreign body of left forearm, initial encounter: Secondary | ICD-10-CM | POA: Diagnosis not present

## 2015-06-19 NOTE — Progress Notes (Signed)
Pre visit review using our clinic review tool, if applicable. No additional management support is needed unless otherwise documented below in the visit note. 

## 2015-06-19 NOTE — Progress Notes (Signed)
HPI:  Acute visit for:  L forearm skin abrasion: -fell yesterday and the day before and scraped his L forearm -wife has doctored this at home, no further bleeding -denies: fevers, chills, swelling, erythema of area -saw his cardiologist yesterday for his dizziness and per notes felt 2ndary to 2:1 heart block with periodic bradycardia or his "HOCM/near-cavity obliteration" Hx of Severe AS s/p TAVR. His bb was held and he reports he if feeling better today with no further falls and has f/u with his cardiologist in a few days  ROS: See pertinent positives and negatives per HPI.  Past Medical History  Diagnosis Date  . Essential hypertension   . ERECTILE DYSFUNCTION   . HEARING LOSS   . TIA (transient ischemic attack) 01/21/2014    5 min spell aphasia without assoc sx    . CATARACTS, BILATERAL   . History of prostate cancer 10/08/2010  . Squamous cell skin cancer     left lower leg  . Severe aortic stenosis     a. s/p TAVR 03/2015. Pre-op  LHC 02/2015: minor nonobstructive CAD.  Marland Kitchen Hypertrophic obstructive cardiomyopathy ([place])   . CVA (cerebral infarction) 02/19/2014    Small infarct high posterior left frontal lobe on MRI   . DEGENERATIVE JOINT DISEASE, SPINE   . OA (osteoarthritis)     both hips  . 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 LBBB (left bundle branch block)   . First degree AV block   . Bradycardia     a. HR 30s in clinic 06/2015 - BB discontinued.  Marland Kitchen Heart block     Past Surgical History  Procedure Laterality Date  . Prostate surgery    . Joint replacement    . Hemiarthroplasty shoulder fracture    . Cataract extraction      right 2004, left 2010  . Cardiac catheterization N/A 02/20/2015    Procedure: Right/Left Heart Cath and Coronary Angiography;  Surgeon: Sherren Mocha, MD;  Location: [facility];  Service: Cardiovascular;  Laterality:  N/A;  . Tee without cardioversion N/A 03/07/2015    Procedure: TRANSESOPHAGEAL ECHOCARDIOGRAM (TEE);  Surgeon: Dorothy Spark, MD;  Location: [facility];  Service: Cardiovascular;  Laterality: N/A;  . Eye surgery    . Tonsillectomy    . Anterior cervical discectomy/fusion      10-12 yrs ago...Dr. Louanne Skye  . External radiation      2010 -- 40 treatments  . Transcatheter aortic valve replacement, transfemoral N/A 03/25/2015    Procedure: TRANSCATHETER AORTIC VALVE REPLACEMENT, TRANSFEMORAL;  Surgeon: Burnell Blanks, MD;  Location: [facility];  Service: Open Heart Surgery;  Laterality: N/A;  . Tee without cardioversion N/A 03/25/2015    Procedure: TRANSESOPHAGEAL ECHOCARDIOGRAM (TEE);  Surgeon: Burnell Blanks, MD;  Location: [facility];  Service: Open Heart Surgery;  Laterality: N/A;    Family History  Problem Relation Age of Onset  . Heart disease Mother   . Cancer Father   . Cancer Sister   . Prostate cancer Brother   . Heart disease Brother   . Lung cancer Brother   . CVA Mother   . CVA Father   . Heart attack Brother   . Hypertension Neg Hx   . Stroke Mother     Social History   Social History  . Marital Status: Married    Spouse Name: N/A  . Number of  Children: N/A  . Years of Education: N/A   Social History Main Topics  . Smoking status: Never Smoker   . Smokeless tobacco: None  . Alcohol Use: No  . Drug Use: No  . Sexual Activity: No   Other Topics Concern  . None   Social History Narrative     Current outpatient prescriptions:  .  clopidogrel (PLAVIX) 75 MG tablet, TAKE 1 TABLET (75 MG TOTAL) BY MOUTH DAILY., Disp: 90 tablet, Rfl: 3 .  docusate sodium (COLACE) 100 MG capsule, Take 200 mg by mouth every morning. , Disp: , Rfl:  .  pravastatin (PRAVACHOL) 10 MG tablet, Take 1 tablet (10 mg total) by mouth at bedtime., Disp: 100 tablet, Rfl: 3 .  tamsulosin (FLOMAX) 0.4 MG CAPS capsule, Take 1 capsule (0.4 mg total) by mouth 3 (three) times a week. Monday  Wednesday Fridday, Disp: 100 capsule, Rfl: 3  EXAM:  Filed Vitals:   06/19/15 1531  BP: 118/56  Pulse: 44  Temp: 97.4 F (36.3 C)    There is no weight on file to calculate BMI.  GENERAL: vitals reviewed and listed above, alert, oriented, appears well hydrated and in no acute distress  HEENT: atraumatic, conjunttiva clear, no obvious abnormalities on inspection of external nose and ears  NECK: no obvious masses on inspection  LUNGS: clear to auscultation bilaterally, no wheezes, rales or rhonchi, good air movement  CV: brady, SEM  MS: moves all extremities without noticeable abnormality - using walker  SKIN: large superficial skin tear L forearm, no signs of infection, no active bleeding  PSYCH: pleasant and cooperative, no obvious depression or anxiety  ASSESSMENT AND PLAN:  Discussed the following assessment and plan:  Skin tear of forearm without complication, left, initial encounter  Hypertrophic obstructive cardiomyopathy (HCC)  Bradycardia  -wound debrided, dressing appied and wound care recs and supplies given, return precuations -cont to hold BB - feels better today, still brady - but improved -follow up with cards as plan, emergency precautions/advise to notify cards if feeling bad again -fall precautions -follow up next week -Patient advised to return or notify a doctor immediately if symptoms worsen or persist or new concerns arise.  There are no Patient Instructions on file for this visit.   Robert Benton R.

## 2015-06-19 NOTE — Patient Instructions (Signed)
Schedule follow up next week with your doctor  Follow up with your cardiologist immediately if feeling bad again  Change dressing daily and seek care immediately if bleeding that does not stop or any concerns for infection per our discussiong

## 2015-06-23 ENCOUNTER — Observation Stay (HOSPITAL_COMMUNITY): Payer: Medicare Other

## 2015-06-23 ENCOUNTER — Ambulatory Visit (INDEPENDENT_AMBULATORY_CARE_PROVIDER_SITE_OTHER): Payer: Medicare Other | Admitting: Nurse Practitioner

## 2015-06-23 ENCOUNTER — Other Ambulatory Visit: Payer: Self-pay | Admitting: Nurse Practitioner

## 2015-06-23 ENCOUNTER — Inpatient Hospital Stay (HOSPITAL_COMMUNITY)
Admission: AD | Admit: 2015-06-23 | Discharge: 2015-06-25 | DRG: 243 | Disposition: A | Discharge status: Home / Self Care | Payer: Medicare Other | Source: Ambulatory Visit | Attending: Internal Medicine | Admitting: Internal Medicine

## 2015-06-23 ENCOUNTER — Encounter: Payer: Self-pay | Admitting: Nurse Practitioner

## 2015-06-23 VITALS — BP 150/62 | HR 40 | Ht 69.0 in | Wt 216.8 lb

## 2015-06-23 DIAGNOSIS — Z9841 Cataract extraction status, right eye: Secondary | ICD-10-CM | POA: Diagnosis not present

## 2015-06-23 DIAGNOSIS — Z950 Presence of cardiac pacemaker: Secondary | ICD-10-CM

## 2015-06-23 DIAGNOSIS — I421 Obstructive hypertrophic cardiomyopathy: Secondary | ICD-10-CM | POA: Diagnosis not present

## 2015-06-23 DIAGNOSIS — Z981 Arthrodesis status: Secondary | ICD-10-CM

## 2015-06-23 DIAGNOSIS — T82897A Other specified complication of cardiac prosthetic devices, implants and grafts, initial encounter: Secondary | ICD-10-CM | POA: Diagnosis not present

## 2015-06-23 DIAGNOSIS — I10 Essential (primary) hypertension: Secondary | ICD-10-CM | POA: Diagnosis present

## 2015-06-23 DIAGNOSIS — Z79899 Other long term (current) drug therapy: Secondary | ICD-10-CM

## 2015-06-23 DIAGNOSIS — M160 Bilateral primary osteoarthritis of hip: Secondary | ICD-10-CM | POA: Diagnosis not present

## 2015-06-23 DIAGNOSIS — Z9842 Cataract extraction status, left eye: Secondary | ICD-10-CM | POA: Diagnosis not present

## 2015-06-23 DIAGNOSIS — Z8673 Personal history of transient ischemic attack (TIA), and cerebral infarction without residual deficits: Secondary | ICD-10-CM | POA: Diagnosis not present

## 2015-06-23 DIAGNOSIS — I6309 Cerebral infarction due to thrombosis of other precerebral artery: Secondary | ICD-10-CM

## 2015-06-23 DIAGNOSIS — Z952 Presence of prosthetic heart valve: Secondary | ICD-10-CM

## 2015-06-23 DIAGNOSIS — I319 Disease of pericardium, unspecified: Secondary | ICD-10-CM | POA: Diagnosis not present

## 2015-06-23 DIAGNOSIS — Z7902 Long term (current) use of antithrombotics/antiplatelets: Secondary | ICD-10-CM | POA: Diagnosis not present

## 2015-06-23 DIAGNOSIS — H9190 Unspecified hearing loss, unspecified ear: Secondary | ICD-10-CM | POA: Diagnosis present

## 2015-06-23 DIAGNOSIS — I442 Atrioventricular block, complete: Secondary | ICD-10-CM

## 2015-06-23 DIAGNOSIS — I313 Pericardial effusion (noninflammatory): Secondary | ICD-10-CM | POA: Diagnosis not present

## 2015-06-23 DIAGNOSIS — Z01818 Encounter for other preprocedural examination: Secondary | ICD-10-CM | POA: Diagnosis not present

## 2015-06-23 DIAGNOSIS — Z9181 History of falling: Secondary | ICD-10-CM

## 2015-06-23 DIAGNOSIS — J811 Chronic pulmonary edema: Secondary | ICD-10-CM | POA: Diagnosis not present

## 2015-06-23 DIAGNOSIS — R001 Bradycardia, unspecified: Secondary | ICD-10-CM | POA: Diagnosis present

## 2015-06-23 DIAGNOSIS — Y710 Diagnostic and monitoring cardiovascular devices associated with adverse incidents: Secondary | ICD-10-CM | POA: Diagnosis present

## 2015-06-23 DIAGNOSIS — Z954 Presence of other heart-valve replacement: Secondary | ICD-10-CM

## 2015-06-23 DIAGNOSIS — I480 Paroxysmal atrial fibrillation: Secondary | ICD-10-CM | POA: Diagnosis not present

## 2015-06-23 HISTORY — DX: Presence of cardiac pacemaker: Z95.0

## 2015-06-23 HISTORY — DX: Cerebral infarction, unspecified: I63.9

## 2015-06-23 HISTORY — DX: Malignant neoplasm of prostate: C61

## 2015-06-23 HISTORY — DX: Hyperlipidemia, unspecified: E78.5

## 2015-06-23 LAB — COMPREHENSIVE METABOLIC PANEL
ALT: 14 U/L — ABNORMAL LOW (ref 17–63)
AST: 17 U/L (ref 15–41)
Albumin: 3.5 g/dL (ref 3.5–5.0)
Alkaline Phosphatase: 62 U/L (ref 38–126)
Anion gap: 6 (ref 5–15)
BUN: 25 mg/dL — ABNORMAL HIGH (ref 6–20)
CO2: 26 mmol/L (ref 22–32)
Calcium: 9.3 mg/dL (ref 8.9–10.3)
Chloride: 106 mmol/L (ref 101–111)
Creatinine, Ser: 1.22 mg/dL (ref 0.61–1.24)
GFR calc Af Amer: 59 mL/min — ABNORMAL LOW (ref >60)
GFR calc non Af Amer: 51 mL/min — ABNORMAL LOW (ref >60)
Glucose, Bld: 97 mg/dL (ref 65–99)
Potassium: 4.4 mmol/L (ref 3.5–5.1)
Sodium: 138 mmol/L (ref 135–145)
Total Bilirubin: 0.6 mg/dL (ref 0.3–1.2)
Total Protein: 6.4 g/dL — ABNORMAL LOW (ref 6.5–8.1)

## 2015-06-23 LAB — CBC
HCT: 43.7 % (ref 39.0–52.0)
Hemoglobin: 14.4 g/dL (ref 13.0–17.0)
MCH: 30 pg (ref 26.0–34.0)
MCHC: 33 g/dL (ref 30.0–36.0)
MCV: 91 fL (ref 78.0–100.0)
Platelets: 176 10*3/uL (ref 150–400)
RBC: 4.8 MIL/uL (ref 4.22–5.81)
RDW: 13.3 % (ref 11.5–15.5)
WBC: 7.5 10*3/uL (ref 4.0–10.5)

## 2015-06-23 LAB — PROTIME-INR
INR: 1.1 (ref 0.00–1.49)
Prothrombin Time: 14.4 seconds (ref 11.6–15.2)

## 2015-06-23 LAB — TSH: TSH: 1.704 u[IU]/mL (ref 0.350–4.500)

## 2015-06-23 LAB — URINALYSIS, ROUTINE W REFLEX MICROSCOPIC
Bilirubin Urine: NEGATIVE
Glucose, UA: NEGATIVE mg/dL
Hgb urine dipstick: NEGATIVE
Ketones, ur: NEGATIVE mg/dL
Leukocytes, UA: NEGATIVE
Nitrite: NEGATIVE
Protein, ur: NEGATIVE mg/dL
Specific Gravity, Urine: 1.017 (ref 1.005–1.030)
Urobilinogen, UA: 0.2 mg/dL (ref 0.0–1.0)
pH: 5.5 (ref 5.0–8.0)

## 2015-06-23 LAB — APTT: aPTT: 30 seconds (ref 24–37)

## 2015-06-23 MED ORDER — ASPIRIN EC 81 MG PO TBEC
81.0000 mg | DELAYED_RELEASE_TABLET | Freq: Every day | ORAL | Status: DC
  Administered 2015-06-23 – 2015-06-24 (×2): 81 mg via ORAL
  Filled 2015-06-23 (×2): qty 1

## 2015-06-23 MED ORDER — CEFAZOLIN SODIUM-DEXTROSE 2-3 GM-% IV SOLR
2.0000 g | INTRAVENOUS | Status: AC
  Administered 2015-06-24: 2 g via INTRAVENOUS
  Filled 2015-06-23: qty 50

## 2015-06-23 MED ORDER — CHLORHEXIDINE GLUCONATE 4 % EX LIQD
60.0000 mL | Freq: Once | CUTANEOUS | Status: DC
  Filled 2015-06-23: qty 15

## 2015-06-23 MED ORDER — SODIUM CHLORIDE 0.9 % IV SOLN: INTRAVENOUS | Status: DC

## 2015-06-23 MED ORDER — TAMSULOSIN HCL 0.4 MG PO CAPS
0.4000 mg | ORAL_CAPSULE | ORAL | Status: DC
  Administered 2015-06-23: 0.4 mg via ORAL
  Filled 2015-06-23: qty 1

## 2015-06-23 MED ORDER — CHLORHEXIDINE GLUCONATE 4 % EX LIQD: 60.0000 mL | Freq: Once | CUTANEOUS | Status: DC

## 2015-06-23 MED ORDER — CLOPIDOGREL BISULFATE 75 MG PO TABS
75.0000 mg | ORAL_TABLET | Freq: Every day | ORAL | Status: DC
  Administered 2015-06-24: 75 mg via ORAL
  Filled 2015-06-23: qty 1

## 2015-06-23 MED ORDER — CHLORHEXIDINE GLUCONATE 4 % EX LIQD
60.0000 mL | Freq: Once | CUTANEOUS | Status: AC
  Administered 2015-06-23: 4 via TOPICAL
  Filled 2015-06-23: qty 15

## 2015-06-23 MED ORDER — CEFAZOLIN SODIUM-DEXTROSE 2-3 GM-% IV SOLR
2.0000 g | INTRAVENOUS | Status: DC
  Filled 2015-06-23: qty 50

## 2015-06-23 MED ORDER — SODIUM CHLORIDE 0.9 % IR SOLN
80.0000 mg | Status: AC
  Administered 2015-06-24: 80 mg
  Filled 2015-06-23: qty 2

## 2015-06-23 MED ORDER — PRAVASTATIN SODIUM 10 MG PO TABS
10.0000 mg | ORAL_TABLET | Freq: Every day | ORAL | Status: DC
  Administered 2015-06-23: 10 mg via ORAL
  Filled 2015-06-23 (×2): qty 1

## 2015-06-23 MED ORDER — DOCUSATE SODIUM 100 MG PO CAPS
200.0000 mg | ORAL_CAPSULE | Freq: Every day | ORAL | Status: DC
  Administered 2015-06-24: 200 mg via ORAL
  Filled 2015-06-23: qty 2

## 2015-06-23 NOTE — Progress Notes (Signed)
CARDIOLOGY OFFICE NOTE  Date:  06/23/2015    Robert Anderson Date of Birth: 05/19/1924 Medical Record #[id number]  PCP:  Joycelyn Man, MD  Cardiologist:  Meda Coffee    Chief Complaint  Patient presents with  . Dizziness    Follow up visit - seen for Dr. Meda Coffee.   . Irregular Heart Beat    History of Present Illness: Robert Anderson is a 81 y.o. male who presents today for a follow up visit. Seen for Dr. Meda Coffee. This is a 5 day check. He has a history of severe AS s/p TAVR 03/25/15, HOCM, LBBB, 1st degree AVB, essential HTN, h/o TIA & CVA.  LHC 02/2015: minor nonobstructive CAD. 2D echo 04/25/15: vigorous LV systolic function with near-cavity obliteration, moderate LVH, EF 65-70%, grade 1 DD, mod LAE.   He was seen for dizziness on 05/08/15 at which time his metoprolol was re-introduced given HOCM. He was advised to take 1/2 tablet daily for several days and if tolerated, increase to BID. He did so and initially felt OK. However, on Sunday 06/15/15 he was walking to the bathroom and suddenly felt worsening dizziness with blackened vision. He fell, striking his arm on the door frame. He is not sure if he lost consciousness. His wife came to his side right away and he was oriented. He decided to drop his metporolol back to once per day. Since that time he's had continued intermittent dizziness and sustained another fall last week where he just suddenly felt weak. He did not have syncope at that time. He denied any head injury with either event. He endorsed continued DOE. No chest pain. No bleeding reported.   Seen last week by Melina Copa, PA and HR was 34 by EKG - f/u readings 40 by manual pulse check. Surprisingly he said he was feeling better. EKG with 2:1 heart block noted with profound bradycardia. Metoprolol was stopped and he was to come back for repeat evaluation.  Comes back today. Here with wife and daughter. Still feels dizzy. HR still low. BP has improved. He has not been able to return to  the gym. Has had another fall since he was last here - from being too dizzy. No significant injury but had to see PCP due to his arm being scraped up. BP has come up nicely.   Past Medical History  Diagnosis Date  . Essential hypertension   . ERECTILE DYSFUNCTION   . HEARING LOSS   . TIA (transient ischemic attack) 01/21/2014    5 min spell aphasia without assoc sx    . CATARACTS, BILATERAL   . History of prostate cancer 10/08/2010  . Squamous cell skin cancer     left lower leg  . Severe aortic stenosis     a. s/p TAVR 03/2015. Pre-op  LHC 02/2015: minor nonobstructive CAD.  Marland Kitchen Hypertrophic obstructive cardiomyopathy ([place])   . CVA (cerebral infarction) 02/19/2014    Small infarct high posterior left frontal lobe on MRI   . DEGENERATIVE JOINT DISEASE, SPINE   . OA (osteoarthritis)     both hips  . 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 LBBB (left bundle branch block)   . First degree AV block   . Bradycardia     a. HR 30s in clinic 06/2015 - BB discontinued.  Marland Kitchen Heart block     Past Surgical History  Procedure Laterality Date  . Prostate surgery    . Joint replacement    . Hemiarthroplasty shoulder fracture    . Cataract extraction      right 2004, left 2010  . Cardiac catheterization N/A 02/20/2015    Procedure: Right/Left Heart Cath and Coronary Angiography;  Surgeon: Sherren Mocha, MD;  Location: [facility];  Service: Cardiovascular;  Laterality: N/A;  . Tee without cardioversion N/A 03/07/2015    Procedure: TRANSESOPHAGEAL ECHOCARDIOGRAM (TEE);  Surgeon: Dorothy Spark, MD;  Location: [facility];  Service: Cardiovascular;  Laterality: N/A;  . Eye surgery    . Tonsillectomy    . Anterior cervical discectomy/fusion      10-12 yrs ago...Dr. Louanne Skye  . External radiation      2010 -- 40 treatments  . Transcatheter aortic valve replacement, transfemoral  N/A 03/25/2015    Procedure: TRANSCATHETER AORTIC VALVE REPLACEMENT, TRANSFEMORAL;  Surgeon: Burnell Blanks, MD;  Location: [facility];  Service: Open Heart Surgery;  Laterality: N/A;  . Tee without cardioversion N/A 03/25/2015    Procedure: TRANSESOPHAGEAL ECHOCARDIOGRAM (TEE);  Surgeon: Burnell Blanks, MD;  Location: [facility];  Service: Open Heart Surgery;  Laterality: N/A;     Medications: Current Outpatient Prescriptions  Medication Sig Dispense Refill  . clopidogrel (PLAVIX) 75 MG tablet TAKE 1 TABLET (75 MG TOTAL) BY MOUTH DAILY. 90 tablet 3  . docusate sodium (COLACE) 100 MG capsule Take 200 mg by mouth every morning.     . pravastatin (PRAVACHOL) 10 MG tablet Take 1 tablet (10 mg total) by mouth at bedtime. 100 tablet 3  . tamsulosin (FLOMAX) 0.4 MG CAPS capsule Take 1 capsule (0.4 mg total) by mouth 3 (three) times a week. Monday Wednesday Fridday 100 capsule 3   No current facility-administered medications for this visit.    Allergies: No Known Allergies  Social History: The patient  reports that he has never smoked. He does not have any smokeless tobacco history on file. He reports that he does not drink alcohol or use illicit drugs.   Family History: The patient's family history includes CVA in his father and mother; Cancer in his father and sister; Heart attack in his brother; Heart disease in his brother and mother; Lung cancer in his brother; Prostate cancer in his brother; Stroke in his mother. There is no history of Hypertension.   Review of Systems: Please see the history of present illness.   Otherwise, the review of systems is positive for none.   All other systems are reviewed and negative.   Physical Exam: VS:  BP 150/62 mmHg  Pulse 40  Ht 5\' 9"  (1.753 m)  Wt 216 lb 12.8 oz (98.34 kg)  BMI 32.00 kg/m2 .  BMI Body mass index is 32 kg/(m^2).  Wt Readings from Last 3 Encounters:  06/23/15 216 lb 12.8 oz (98.34 kg)  06/18/15 216 lb (97.977 kg)  05/27/15  213 lb (96.616 kg)    General: Pleasant. Elderly male - looks chronically ill but in no acute distress. Left arm dressing in place.  HEENT: Normal. Neck: Supple, no JVD, carotid bruits, or masses noted.  Cardiac: Irregular irregular rhythm. Harsh systolic murmur. Rate is slow.   Respiratory:  Lungs are clear to auscultation bilaterally with normal work of breathing.  GI: Soft and nontender.  MS: No deformity or atrophy. Gait and ROM intact. Using a walker.  Skin: Warm and dry. Color is normal.  Neuro:  Strength and sensation are intact and no gross  focal deficits noted.  Psych: Alert, appropriate and with normal affect.   LABORATORY DATA:  EKG:  EKG is ordered today. This demonstrates complete heart block - rate of 40 - reviewed with Dr. Lovena Le here in the office.  Lab Results  Component Value Date   WBC 9.1 06/18/2015   HGB 14.2 06/18/2015   HCT 42.8 06/18/2015   PLT 189 06/18/2015   GLUCOSE 97 06/18/2015   CHOL 158 05/20/2015   TRIG 161.0* 05/20/2015   HDL 45.10 05/20/2015   LDLDIRECT 134.9 05/03/2013   LDLCALC 80 05/20/2015   ALT 13 06/18/2015   AST 13 06/18/2015   NA 138 06/18/2015   K 4.6 06/18/2015   CL 107 06/18/2015   CREATININE 1.24* 06/18/2015   BUN 25 06/18/2015   CO2 24 06/18/2015   TSH 2.68 05/20/2015   PSA 0.07* 05/20/2015   INR 1.31 03/25/2015   HGBA1C 6.1* 03/21/2015    BNP (last 3 results) No results for input(s): BNP in the last 8760 hours.  ProBNP (last 3 results) No results for input(s): PROBNP in the last 8760 hours.   Other Studies Reviewed Today:  Echo Study Conclusions from 04/2015  - Left ventricle: Vigorous LV systolic function with near cavity obliteration. The cavity size was normal. Wall thickness was increased in a pattern of moderate LVH. Systolic function was vigorous. The estimated ejection fraction was in the range of 65% to 70%. Doppler parameters are consistent with abnormal left ventricular relaxation (grade 1  diastolic dysfunction). - Left atrium: The atrium was moderately dilated.   Assessment/Plan: 1. Persistent dizziness with prior 2:1 heart block - now off of metoprolol.  It's hard to know if his original dizziness was due to periodic bradycardia or due to his HOCM/near-cavity obliteration. He looks to now be in complete heart block - reviewed with Dr. Lovena Le - will proceed with admission with plans for [facility] tomorrow per EP.  2. Essential HTN - BP has come up and is stable at present - this is off of metoprolol. May be able to restart once PPM in place.  3. HOCM - see above. 4. Severe AS s/p TAVR - echo in 04/2015 was stable. Maintained on Plavix. 5. Recent falls   Current medicines are reviewed with the patient today.  The patient does not have concerns regarding medicines other than what has been noted above.  The following changes have been made:  See above.  Labs/ tests ordered today include:   No orders of the defined types were placed in this encounter.     Disposition:   To be admitted to [facility]. Labs tonight. PPM implant tomorrow - currently scheduled for 6pm with Dr. Lovena Le   Patient is agreeable to this plan and will call if any problems develop in the interim.   Signed: Burtis Junes, RN, ANP-C 06/23/2015 3:24 PM  [facility] [address] [place], [place]  [address] Phone: [phone] Fax: [phone]

## 2015-06-23 NOTE — H&P (Signed)
Robert Anderson  06/23/2015 2:30 PM  Office Visit  MRN:  [id number]   Description: Male DOB: Feb 23, 1924  Provider: Burtis Junes, NP  Department: [facility] [address] [facility]       Vital Signs  Most recent update: 06/23/2015 2:59 PM by Tamsen Snider    BP Pulse Ht Wt BMI    150/62 mmHg 40 5\' 9"  (1.753 m) 216 lb 12.8 oz (98.34 kg) 32.00 kg/m2    Vitals History     Progress Notes      Burtis Junes, NP at 06/23/2015 2:46 PM     Status: Signed       Expand All Collapse All       CARDIOLOGY OFFICE NOTE  Date: 06/23/2015    Robert Anderson Date of Birth: 1924/04/02 Medical Record #[id number]  PCP: Joycelyn Man, MD Cardiologist: Meda Coffee   Chief Complaint  Patient presents with  . Dizziness    Follow up visit - seen for Dr. Meda Coffee.   . Irregular Heart Beat    History of Present Illness: Robert Anderson is a 81 y.o. male who presents today for a follow up visit. Seen for Dr. Meda Coffee. This is a 5 day check. He has a history of severe AS s/p TAVR 03/25/15, HOCM, LBBB, 1st degree AVB, essential HTN, h/o TIA & CVA. LHC 02/2015: minor nonobstructive CAD. 2D echo 04/25/15: vigorous LV systolic function with near-cavity obliteration, moderate LVH, EF 65-70%, grade 1 DD, mod LAE.   He was seen for dizziness on 05/08/15 at which time his metoprolol was re-introduced given HOCM. He was advised to take 1/2 tablet daily for several days and if tolerated, increase to BID. He did so and initially felt OK. However, on Sunday 06/15/15 he was walking to the bathroom and suddenly felt worsening dizziness with blackened vision. He fell, striking his arm on the door frame. He is not sure if he lost consciousness. His wife came to his side right away and he was oriented. He decided to drop his metporolol back to once per day. Since that time he's had continued intermittent dizziness and sustained another fall last week where he just suddenly felt weak. He did not have syncope at that time.  He denied any head injury with either event. He endorsed continued DOE. No chest pain. No bleeding reported.   Seen last week by Melina Copa, PA and HR was 34 by EKG - f/u readings 40 by manual pulse check. Surprisingly he said he was feeling better. EKG with 2:1 heart block noted with profound bradycardia. Metoprolol was stopped and he was to come back for repeat evaluation.  Comes back today. Here with wife and daughter. Still feels dizzy. HR still low. BP has improved. He has not been able to return to the gym. Has had another fall since he was last here - from being too dizzy. No significant injury but had to see PCP due to his arm being scraped up. BP has come up nicely.   Past Medical History  Diagnosis Date  . Essential hypertension   . ERECTILE DYSFUNCTION   . HEARING LOSS   . TIA (transient ischemic attack) 01/21/2014    5 min spell aphasia without assoc sx   . CATARACTS, BILATERAL   . History of prostate cancer 10/08/2010  . Squamous cell skin cancer     left lower leg  . Severe aortic stenosis     a. s/p TAVR 03/2015. Pre-op LHC 02/2015: minor nonobstructive CAD.  Marland Kitchen  Hypertrophic obstructive cardiomyopathy ([place])   . CVA (cerebral infarction) 02/19/2014    Small infarct high posterior left frontal lobe on MRI   . DEGENERATIVE JOINT DISEASE, SPINE   . OA (osteoarthritis)     both hips  . 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 LBBB (left bundle branch block)   . First degree AV block   . Bradycardia     a. HR 30s in clinic 06/2015 - BB discontinued.  Marland Kitchen Heart block     Past Surgical History  Procedure Laterality Date  . Prostate surgery    . Joint replacement    . Hemiarthroplasty shoulder fracture    . Cataract extraction      right 2004, left 2010  .  Cardiac catheterization N/A 02/20/2015    Procedure: Right/Left Heart Cath and Coronary Angiography; Surgeon: Sherren Mocha, MD; Location: [facility]; Service: Cardiovascular; Laterality: N/A;  . Tee without cardioversion N/A 03/07/2015    Procedure: TRANSESOPHAGEAL ECHOCARDIOGRAM (TEE); Surgeon: Dorothy Spark, MD; Location: [facility]; Service: Cardiovascular; Laterality: N/A;  . Eye surgery    . Tonsillectomy    . Anterior cervical discectomy/fusion      10-12 yrs ago...Dr. Louanne Skye  . External radiation      2010 -- 40 treatments  . Transcatheter aortic valve replacement, transfemoral N/A 03/25/2015    Procedure: TRANSCATHETER AORTIC VALVE REPLACEMENT, TRANSFEMORAL; Surgeon: Burnell Blanks, MD; Location: [facility]; Service: Open Heart Surgery; Laterality: N/A;  . Tee without cardioversion N/A 03/25/2015    Procedure: TRANSESOPHAGEAL ECHOCARDIOGRAM (TEE); Surgeon: Burnell Blanks, MD; Location: [facility]; Service: Open Heart Surgery; Laterality: N/A;     Medications: Current Outpatient Prescriptions  Medication Sig Dispense Refill  . clopidogrel (PLAVIX) 75 MG tablet TAKE 1 TABLET (75 MG TOTAL) BY MOUTH DAILY. 90 tablet 3  . docusate sodium (COLACE) 100 MG capsule Take 200 mg by mouth every morning.     . pravastatin (PRAVACHOL) 10 MG tablet Take 1 tablet (10 mg total) by mouth at bedtime. 100 tablet 3  . tamsulosin (FLOMAX) 0.4 MG CAPS capsule Take 1 capsule (0.4 mg total) by mouth 3 (three) times a week. Monday Wednesday Fridday 100 capsule 3   No current facility-administered medications for this visit.    Allergies: No Known Allergies  Social History: The patient  reports that he has never smoked. He does not have any smokeless tobacco history on file. He reports that he does not drink alcohol or use illicit drugs.  Family History: The patient's family history includes  CVA in his father and mother; Cancer in his father and sister; Heart attack in his brother; Heart disease in his brother and mother; Lung cancer in his brother; Prostate cancer in his brother; Stroke in his mother. There is no history of Hypertension.   Review of Systems: Please see the history of present illness. Otherwise, the review of systems is positive for none. All other systems are reviewed and negative.   Physical Exam: VS: BP 150/62 mmHg  Pulse 40  Ht 5\' 9"  (1.753 m)  Wt 216 lb 12.8 oz (98.34 kg)  BMI 32.00 kg/m2 . BMI Body mass index is 32 kg/(m^2).  Wt Readings from Last 3 Encounters:  06/23/15 216 lb 12.8 oz (98.34 kg)  06/18/15 216 lb (97.977 kg)  05/27/15 213 lb (96.616 kg)    General: Pleasant. Elderly male - looks  chronically ill but in no acute distress. Left arm dressing in place.  HEENT: Normal.  Neck: Supple, no JVD, carotid bruits, or masses noted.  Cardiac: Irregular irregular rhythm. Harsh systolic murmur. Rate is slow.  Respiratory: Lungs are clear to auscultation bilaterally with normal work of breathing.  GI: Soft and nontender.  MS: No deformity or atrophy. Gait and ROM intact. Using a walker.  Skin: Warm and dry. Color is normal.  Neuro: Strength and sensation are intact and no gross focal deficits noted.  Psych: Alert, appropriate and with normal affect.   LABORATORY DATA:  EKG: EKG is ordered today. This demonstrates complete heart block - rate of 40 - reviewed with Dr. Lovena Le here in the office.   Recent Labs    Lab Results  Component Value Date   WBC 9.1 06/18/2015   HGB 14.2 06/18/2015   HCT 42.8 06/18/2015   PLT 189 06/18/2015   GLUCOSE 97 06/18/2015   CHOL 158 05/20/2015   TRIG 161.0* 05/20/2015   HDL 45.10 05/20/2015   LDLDIRECT 134.9 05/03/2013   LDLCALC 80 05/20/2015   ALT 13 06/18/2015   AST 13 06/18/2015   NA 138 06/18/2015   K 4.6  06/18/2015   CL 107 06/18/2015   CREATININE 1.24* 06/18/2015   BUN 25 06/18/2015   CO2 24 06/18/2015   TSH 2.68 05/20/2015   PSA 0.07* 05/20/2015   INR 1.31 03/25/2015   HGBA1C 6.1* 03/21/2015      BNP (last 3 results)  Recent Labs (within last 365 days)    No results for input(s): BNP in the last 8760 hours.    ProBNP (last 3 results)  Recent Labs (within last 365 days)    No results for input(s): PROBNP in the last 8760 hours.     Other Studies Reviewed Today:  Echo Study Conclusions from 04/2015  - Left ventricle: Vigorous LV systolic function with near cavity obliteration. The cavity size was normal. Wall thickness was increased in a pattern of moderate LVH. Systolic function was vigorous. The estimated ejection fraction was in the range of 65% to 70%. Doppler parameters are consistent with abnormal left ventricular relaxation (grade 1 diastolic dysfunction). - Left atrium: The atrium was moderately dilated.   Assessment/Plan: 1. Persistent dizziness with prior 2:1 heart block - now off of metoprolol. It's hard to know if his original dizziness was due to periodic bradycardia or due to his HOCM/near-cavity obliteration. He looks to now be in complete heart block - reviewed with Dr. Lovena Le - will proceed with admission with plans for [facility] tomorrow per EP. Patient is on Plavix - will hold on Lovenox.  2. Essential HTN - BP has come up and is stable at present - this is off of metoprolol. May be able to restart once PPM in place.  3. HOCM - see above. 4. Severe AS s/p TAVR - echo in 04/2015 was stable. Maintained on Plavix. 5. Recent falls   Current medicines are reviewed with the patient today. The patient does not have concerns regarding medicines other than what has been noted above.  The following changes have been made: See above.  Labs/ tests ordered today include:   No orders of the defined types were placed in  this encounter.    Disposition: To be admitted to [facility]. Labs tonight. PPM implant tomorrow - currently scheduled for 6pm with Dr. Lovena Le   Patient is agreeable to this plan and will call if any problems develop in the interim.  Signed: Burtis Junes, RN, ANP-C 06/23/2015 3:24 PM  [facility] [address] [place], [place] [address] Phone: [phone] Fax: [phone]                   Referring Provider     Dorena Cookey, MD     Diagnoses     AV block, 3rd degree [facility]) - Primary    ICD-9-CM: 426.0 ICD-10-CM: I44.2    HOCM (hypertrophic obstructive cardiomyopathy) ([place])     ICD-9-CM: 425.11 ICD-10-CM: I42.1    S/P TAVR (transcatheter aortic valve replacement)     ICD-9-CM: V43.3 ICD-10-CM: Z95.4       Reason for Visit     Dizziness    Follow up visit - seen for Dr. Meda Coffee.     Irregular Heart Beat    Reason for Visit History        Level of Service     PR OFFICE OUTPATIENT VISIT 25 MINUTES [99214]      Follow-up and Disposition     Routing History       All Charges for This Encounter     Code Description Service Date Service Provider Modifiers Qty    [place], COMPLETE 06/23/2015 Burtis Junes, NP  1    [phone] PR OFFICE OUTPATIENT VISIT 25 MINUTES 06/23/2015 Burtis Junes, NP  1      AVS Reports     No AVS Snapshots are available for this encounter.     Routing History     There are no sent or routed communications associated with this encounter.     Chart Reviewed By     Dorothy Spark, MD on 06/23/2015 3:32 PM     Previous Visit       Provider Department Encounter #    06/19/2015 3:30 PM Truitt Merle, NP [facility] [id number]

## 2015-06-24 ENCOUNTER — Encounter (HOSPITAL_COMMUNITY)
Admission: AD | Disposition: A | Discharge status: Home / Self Care | Payer: Self-pay | Source: Ambulatory Visit | Attending: Internal Medicine

## 2015-06-24 ENCOUNTER — Encounter (HOSPITAL_COMMUNITY): Payer: Self-pay | Admitting: General Practice

## 2015-06-24 ENCOUNTER — Ambulatory Visit (HOSPITAL_COMMUNITY): Admission: RE | Admit: 2015-06-24 | Payer: Medicare Other | Source: Ambulatory Visit | Admitting: Internal Medicine

## 2015-06-24 DIAGNOSIS — Z952 Presence of prosthetic heart valve: Secondary | ICD-10-CM | POA: Diagnosis not present

## 2015-06-24 DIAGNOSIS — Z79899 Other long term (current) drug therapy: Secondary | ICD-10-CM | POA: Diagnosis not present

## 2015-06-24 DIAGNOSIS — I319 Disease of pericardium, unspecified: Secondary | ICD-10-CM | POA: Diagnosis not present

## 2015-06-24 DIAGNOSIS — I480 Paroxysmal atrial fibrillation: Secondary | ICD-10-CM | POA: Diagnosis not present

## 2015-06-24 DIAGNOSIS — I421 Obstructive hypertrophic cardiomyopathy: Secondary | ICD-10-CM | POA: Diagnosis present

## 2015-06-24 DIAGNOSIS — Z9842 Cataract extraction status, left eye: Secondary | ICD-10-CM | POA: Diagnosis not present

## 2015-06-24 DIAGNOSIS — I442 Atrioventricular block, complete: Secondary | ICD-10-CM | POA: Diagnosis not present

## 2015-06-24 DIAGNOSIS — R0781 Pleurodynia: Secondary | ICD-10-CM | POA: Diagnosis not present

## 2015-06-24 DIAGNOSIS — I313 Pericardial effusion (noninflammatory): Secondary | ICD-10-CM | POA: Diagnosis not present

## 2015-06-24 DIAGNOSIS — Y710 Diagnostic and monitoring cardiovascular devices associated with adverse incidents: Secondary | ICD-10-CM | POA: Diagnosis present

## 2015-06-24 DIAGNOSIS — R0789 Other chest pain: Secondary | ICD-10-CM | POA: Diagnosis not present

## 2015-06-24 DIAGNOSIS — Z950 Presence of cardiac pacemaker: Secondary | ICD-10-CM | POA: Diagnosis not present

## 2015-06-24 DIAGNOSIS — I4891 Unspecified atrial fibrillation: Secondary | ICD-10-CM | POA: Diagnosis not present

## 2015-06-24 DIAGNOSIS — T82120A Displacement of cardiac electrode, initial encounter: Secondary | ICD-10-CM | POA: Diagnosis not present

## 2015-06-24 DIAGNOSIS — R42 Dizziness and giddiness: Secondary | ICD-10-CM | POA: Diagnosis present

## 2015-06-24 DIAGNOSIS — T82897A Other specified complication of cardiac prosthetic devices, implants and grafts, initial encounter: Secondary | ICD-10-CM | POA: Diagnosis not present

## 2015-06-24 DIAGNOSIS — H9190 Unspecified hearing loss, unspecified ear: Secondary | ICD-10-CM | POA: Diagnosis present

## 2015-06-24 DIAGNOSIS — R079 Chest pain, unspecified: Secondary | ICD-10-CM | POA: Diagnosis not present

## 2015-06-24 DIAGNOSIS — Z9889 Other specified postprocedural states: Secondary | ICD-10-CM | POA: Diagnosis not present

## 2015-06-24 DIAGNOSIS — Z7902 Long term (current) use of antithrombotics/antiplatelets: Secondary | ICD-10-CM | POA: Diagnosis not present

## 2015-06-24 DIAGNOSIS — Z8673 Personal history of transient ischemic attack (TIA), and cerebral infarction without residual deficits: Secondary | ICD-10-CM | POA: Diagnosis not present

## 2015-06-24 DIAGNOSIS — Z9841 Cataract extraction status, right eye: Secondary | ICD-10-CM | POA: Diagnosis not present

## 2015-06-24 DIAGNOSIS — Z9181 History of falling: Secondary | ICD-10-CM | POA: Diagnosis not present

## 2015-06-24 DIAGNOSIS — Z45018 Encounter for adjustment and management of other part of cardiac pacemaker: Secondary | ICD-10-CM | POA: Diagnosis not present

## 2015-06-24 DIAGNOSIS — M160 Bilateral primary osteoarthritis of hip: Secondary | ICD-10-CM | POA: Diagnosis present

## 2015-06-24 DIAGNOSIS — I10 Essential (primary) hypertension: Secondary | ICD-10-CM | POA: Diagnosis not present

## 2015-06-24 DIAGNOSIS — I309 Acute pericarditis, unspecified: Secondary | ICD-10-CM | POA: Diagnosis not present

## 2015-06-24 DIAGNOSIS — R001 Bradycardia, unspecified: Secondary | ICD-10-CM | POA: Diagnosis present

## 2015-06-24 DIAGNOSIS — I350 Nonrheumatic aortic (valve) stenosis: Secondary | ICD-10-CM | POA: Diagnosis not present

## 2015-06-24 DIAGNOSIS — R071 Chest pain on breathing: Secondary | ICD-10-CM | POA: Diagnosis not present

## 2015-06-24 DIAGNOSIS — R0602 Shortness of breath: Secondary | ICD-10-CM | POA: Diagnosis not present

## 2015-06-24 DIAGNOSIS — Z981 Arthrodesis status: Secondary | ICD-10-CM | POA: Diagnosis not present

## 2015-06-24 HISTORY — DX: Presence of cardiac pacemaker: Z95.0

## 2015-06-24 HISTORY — PX: INSERT / REPLACE / REMOVE PACEMAKER: SUR710

## 2015-06-24 HISTORY — PX: EP IMPLANTABLE DEVICE: SHX172B

## 2015-06-24 LAB — MRSA PCR SCREENING: MRSA by PCR: NEGATIVE

## 2015-06-24 SURGERY — PACEMAKER IMPLANT

## 2015-06-24 MED ORDER — CEFAZOLIN SODIUM-DEXTROSE 2-3 GM-% IV SOLR
INTRAVENOUS | Status: AC
  Filled 2015-06-24: qty 50

## 2015-06-24 MED ORDER — MIDAZOLAM HCL 5 MG/5ML IJ SOLN
INTRAMUSCULAR | Status: DC | PRN
  Administered 2015-06-24 (×2): 1 mg via INTRAVENOUS

## 2015-06-24 MED ORDER — ACETAMINOPHEN 325 MG PO TABS
325.0000 mg | ORAL_TABLET | ORAL | Status: DC | PRN
  Filled 2015-06-24: qty 2

## 2015-06-24 MED ORDER — CEFAZOLIN SODIUM 1-5 GM-% IV SOLN
1.0000 g | Freq: Four times a day (QID) | INTRAVENOUS | Status: AC
  Administered 2015-06-24 – 2015-06-25 (×3): 1 g via INTRAVENOUS
  Filled 2015-06-24 (×3): qty 50

## 2015-06-24 MED ORDER — SODIUM CHLORIDE 0.9 % IV SOLN: Freq: Once | INTRAVENOUS | Status: DC

## 2015-06-24 MED ORDER — PRAVASTATIN SODIUM 10 MG PO TABS
10.0000 mg | ORAL_TABLET | Freq: Every day | ORAL | Status: DC
  Administered 2015-06-24: 21:00:00 10 mg via ORAL
  Filled 2015-06-24 (×2): qty 1

## 2015-06-24 MED ORDER — LIDOCAINE HCL (PF) 1 % IJ SOLN
INTRAMUSCULAR | Status: DC | PRN
  Administered 2015-06-24: 45 mL via SUBCUTANEOUS

## 2015-06-24 MED ORDER — LIDOCAINE HCL (PF) 1 % IJ SOLN
INTRAMUSCULAR | Status: AC
  Filled 2015-06-24: qty 60

## 2015-06-24 MED ORDER — FENTANYL CITRATE (PF) 100 MCG/2ML IJ SOLN
INTRAMUSCULAR | Status: DC | PRN
  Administered 2015-06-24: 25 ug via INTRAVENOUS

## 2015-06-24 MED ORDER — DOCUSATE SODIUM 100 MG PO CAPS
200.0000 mg | ORAL_CAPSULE | Freq: Every day | ORAL | Status: DC
  Administered 2015-06-25: 200 mg via ORAL
  Filled 2015-06-24: qty 2

## 2015-06-24 MED ORDER — IBUPROFEN 200 MG PO TABS
600.0000 mg | ORAL_TABLET | Freq: Every day | ORAL | Status: DC | PRN
  Administered 2015-06-24: 23:00:00 600 mg via ORAL
  Filled 2015-06-24 (×2): qty 3

## 2015-06-24 MED ORDER — CEFAZOLIN SODIUM-DEXTROSE 2-3 GM-% IV SOLR
INTRAVENOUS | Status: DC | PRN
  Administered 2015-06-24: 2 g via INTRAVENOUS

## 2015-06-24 MED ORDER — ONDANSETRON HCL 4 MG/2ML IJ SOLN: 4.0000 mg | Freq: Four times a day (QID) | INTRAMUSCULAR | Status: DC | PRN

## 2015-06-24 MED ORDER — HEPARIN (PORCINE) IN NACL 2-0.9 UNIT/ML-% IJ SOLN
INTRAMUSCULAR | Status: AC
  Filled 2015-06-24: qty 1000

## 2015-06-24 MED ORDER — CLOPIDOGREL BISULFATE 75 MG PO TABS
75.0000 mg | ORAL_TABLET | Freq: Every day | ORAL | Status: DC
  Administered 2015-06-25: 75 mg via ORAL
  Filled 2015-06-24: qty 1

## 2015-06-24 MED ORDER — CHLORHEXIDINE GLUCONATE 4 % EX LIQD
CUTANEOUS | Status: AC
  Filled 2015-06-24: qty 15

## 2015-06-24 MED ORDER — FENTANYL CITRATE (PF) 100 MCG/2ML IJ SOLN
INTRAMUSCULAR | Status: AC
  Filled 2015-06-24: qty 4

## 2015-06-24 MED ORDER — MIDAZOLAM HCL 5 MG/5ML IJ SOLN
INTRAMUSCULAR | Status: AC
  Filled 2015-06-24: qty 25

## 2015-06-24 MED ORDER — TAMSULOSIN HCL 0.4 MG PO CAPS
0.4000 mg | ORAL_CAPSULE | ORAL | Status: DC
  Administered 2015-06-25: 0.4 mg via ORAL
  Filled 2015-06-24: qty 1

## 2015-06-24 SURGICAL SUPPLY — 7 items
CABLE SURGICAL S-101-97-12 (CABLE) ×2 IMPLANT
LEAD TENDRIL SDX 2088TC-52CM (Lead) ×2 IMPLANT
LEAD TENDRIL SDX 2088TC-58CM (Lead) ×2 IMPLANT
PAD DEFIB LIFELINK (PAD) ×2 IMPLANT
PPM ASSURITY DR PM2240 (Pacemaker) ×2 IMPLANT
SHEATH CLASSIC 7F (SHEATH) ×4 IMPLANT
TRAY PACEMAKER INSERTION (PACKS) ×2 IMPLANT

## 2015-06-24 NOTE — H&P (View-Only) (Signed)
CARDIOLOGY OFFICE NOTE  Date: 06/23/2015    Robert Anderson Date of Birth: 01/09/1924 Medical Record #[id number]  PCP: Joycelyn Man, MD Cardiologist: Meda Coffee   Chief Complaint  Patient presents with  . Dizziness    Follow up visit - seen for Dr. Meda Coffee.   . Irregular Heart Beat    History of Present Illness: Robert Anderson is a 81 y.o. male who presents today for a follow up visit. Seen for Dr. Meda Coffee. This is a 5 day check. He has a history of severe AS s/p TAVR 03/25/15, HOCM, LBBB, 1st degree AVB, essential HTN, h/o TIA & CVA. LHC 02/2015: minor nonobstructive CAD. 2D echo 04/25/15: vigorous LV systolic function with near-cavity obliteration, moderate LVH, EF 65-70%, grade 1 DD, mod LAE.   He was seen for dizziness on 05/08/15 at which time his metoprolol was re-introduced given HOCM. He was advised to take 1/2 tablet daily for several days and if tolerated, increase to BID. He did so and initially felt OK. However, on Sunday 06/15/15 he was walking to the bathroom and suddenly felt worsening dizziness with blackened vision. He fell, striking his arm on the door frame. He is not sure if he lost consciousness. His wife came to his side right away and he was oriented. He decided to drop his metporolol back to once per day. Since that time he's had continued intermittent dizziness and sustained another fall last week where he just suddenly felt weak. He did not have syncope at that time. He denied any head injury with either event. He endorsed continued DOE. No chest pain. No bleeding reported.   Seen last week by Melina Copa, PA and HR was 34 by EKG - f/u readings 40 by manual pulse check. Surprisingly he said he was feeling better. EKG with 2:1 heart block noted with profound bradycardia. Metoprolol was stopped and he was to come back for repeat evaluation.  Comes back today. Here with wife and daughter. Still feels dizzy. HR still low. BP has improved. He has not  been able to return to the gym. Has had another fall since he was last here - from being too dizzy. No significant injury but had to see PCP due to his arm being scraped up. BP has come up nicely.   Past Medical History  Diagnosis Date  . Essential hypertension   . ERECTILE DYSFUNCTION   . HEARING LOSS   . TIA (transient ischemic attack) 01/21/2014    5 min spell aphasia without assoc sx   . CATARACTS, BILATERAL   . History of prostate cancer 10/08/2010  . Squamous cell skin cancer     left lower leg  . Severe aortic stenosis     a. s/p TAVR 03/2015. Pre-op LHC 02/2015: minor nonobstructive CAD.  Marland Kitchen Hypertrophic obstructive cardiomyopathy ([place])   . CVA (cerebral infarction) 02/19/2014    Small infarct high posterior left frontal lobe on MRI   . DEGENERATIVE JOINT DISEASE, SPINE   . OA (osteoarthritis)     both hips  . 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 LBBB (left bundle branch block)   . First degree AV block   . Bradycardia     a. HR 30s in clinic 06/2015 - BB discontinued.  Marland Kitchen Heart block     Past Surgical History  Procedure Laterality Date  . Prostate surgery    .  Joint replacement    . Hemiarthroplasty shoulder fracture    . Cataract extraction      right 2004, left 2010  . Cardiac catheterization N/A 02/20/2015    Procedure: Right/Left Heart Cath and Coronary Angiography; Surgeon: Sherren Mocha, MD; Location: [facility]; Service: Cardiovascular; Laterality: N/A;  . Tee without cardioversion N/A 03/07/2015    Procedure: TRANSESOPHAGEAL ECHOCARDIOGRAM (TEE); Surgeon: Dorothy Spark, MD; Location: [place]; Service:  Cardiovascular; Laterality: N/A;  . Eye surgery    . Tonsillectomy    . Anterior cervical discectomy/fusion      10-12 yrs ago...Dr. Louanne Skye  . External radiation      2010 -- 40 treatments  . Transcatheter aortic valve replacement, transfemoral N/A 03/25/2015    Procedure: TRANSCATHETER AORTIC VALVE REPLACEMENT, TRANSFEMORAL; Surgeon: Burnell Blanks, MD; Location: [facility]; Service: Open Heart Surgery; Laterality: N/A;  . Tee without cardioversion N/A 03/25/2015    Procedure: TRANSESOPHAGEAL ECHOCARDIOGRAM (TEE); Surgeon: Burnell Blanks, MD; Location: [facility]; Service: Open Heart Surgery; Laterality: N/A;     Medications: Current Outpatient Prescriptions  Medication Sig Dispense Refill  . clopidogrel (PLAVIX) 75 MG tablet TAKE 1 TABLET (75 MG TOTAL) BY MOUTH DAILY. 90 tablet 3  . docusate sodium (COLACE) 100 MG capsule Take 200 mg by mouth every morning.     . pravastatin (PRAVACHOL) 10 MG tablet Take 1 tablet (10 mg total) by mouth at bedtime. 100 tablet 3  . tamsulosin (FLOMAX) 0.4 MG CAPS capsule Take 1 capsule (0.4 mg total) by mouth 3 (three) times a week. Monday Wednesday Fridday 100 capsule 3   No current facility-administered medications for this visit.    Allergies: No Known Allergies  Social History: The patient reports that he has never smoked. He does not have any smokeless tobacco history on file. He reports that he does not drink alcohol or use illicit drugs.  Family History: The patient's family history includes CVA in his father and mother; Cancer in his father and sister; Heart attack in his brother; Heart disease in his brother and mother; Lung cancer in his brother; Prostate cancer in his brother; Stroke in his mother. There is no history of Hypertension.   Review of Systems: Please see the history of present  illness. Otherwise, the review of systems is positive for none. All other systems are reviewed and negative.   Physical Exam: VS: BP 150/62 mmHg  Pulse 40  Ht 5\' 9"  (1.753 m)  Wt 216 lb 12.8 oz (98.34 kg)  BMI 32.00 kg/m2 . BMI Body mass index is 32 kg/(m^2).  Wt Readings from Last 3 Encounters:  06/23/15 216 lb 12.8 oz (98.34 kg)  06/18/15 216 lb (97.977 kg)  05/27/15 213 lb (96.616 kg)    General: Pleasant. Elderly male - looks chronically ill but in no acute distress. Left arm dressing in place.  HEENT: Normal.  Neck: Supple, no JVD, carotid bruits, or masses noted.  Cardiac: Irregular irregular rhythm. Harsh systolic murmur. Rate is slow.  Respiratory: Lungs are clear to auscultation bilaterally with normal work of breathing.  GI: Soft and nontender.  MS: No deformity or atrophy. Gait and ROM intact. Using a walker.  Skin: Warm and dry. Color is normal.  Neuro: Strength and sensation are intact and no gross focal deficits noted.  Psych: Alert, appropriate and with normal affect.   LABORATORY DATA:  EKG: EKG is ordered today. This demonstrates complete heart block - rate of 40 - reviewed with Dr. Lovena Le here in  the office.   Recent Labs    Lab Results  Component Value Date   WBC 9.1 06/18/2015   HGB 14.2 06/18/2015   HCT 42.8 06/18/2015   PLT 189 06/18/2015   GLUCOSE 97 06/18/2015   CHOL 158 05/20/2015   TRIG 161.0* 05/20/2015   HDL 45.10 05/20/2015   LDLDIRECT 134.9 05/03/2013   LDLCALC 80 05/20/2015   ALT 13 06/18/2015   AST 13 06/18/2015   NA 138 06/18/2015   K 4.6 06/18/2015   CL 107 06/18/2015   CREATININE 1.24* 06/18/2015   BUN 25 06/18/2015   CO2 24 06/18/2015   TSH 2.68 05/20/2015   PSA 0.07* 05/20/2015   INR 1.31 03/25/2015   HGBA1C  6.1* 03/21/2015      BNP (last 3 results)  Recent Labs (within last 365 days)    No results for input(s): BNP in the last 8760 hours.    ProBNP (last 3 results)  Recent Labs (within last 365 days)    No results for input(s): PROBNP in the last 8760 hours.     Other Studies Reviewed Today:  Echo Study Conclusions from 04/2015  - Left ventricle: Vigorous LV systolic function with near cavity obliteration. The cavity size was normal. Wall thickness was increased in a pattern of moderate LVH. Systolic function was vigorous. The estimated ejection fraction was in the range of 65% to 70%. Doppler parameters are consistent with abnormal left ventricular relaxation (grade 1 diastolic dysfunction). - Left atrium: The atrium was moderately dilated.   Assessment/Plan: 1. Persistent dizziness with prior 2:1 heart block - now off of metoprolol. It's hard to know if his original dizziness was due to periodic bradycardia or due to his HOCM/near-cavity obliteration. He looks to now be in complete heart block - reviewed with Dr. Lovena Le - will proceed with admission with plans for [facility] tomorrow per EP. Patient is on Plavix - will hold on Lovenox.  2. Essential HTN - BP has come up and is stable at present - this is off of metoprolol. May be able to restart once PPM in place.  3. HOCM - see above. 4. Severe AS s/p TAVR - echo in 04/2015 was stable. Maintained on Plavix. 5. Recent falls   Current medicines are reviewed with the patient today. The patient does not have concerns regarding medicines other than what has been noted above.  The following changes have been made: See above.  Labs/ tests ordered today include:   No orders of the defined types were placed in this encounter.    Disposition: To be admitted to [facility]. Labs tonight. PPM implant tomorrow - currently scheduled for 6pm with Dr. Lovena Le   Patient is agreeable to this plan and will call if any  problems develop in the interim.   Signed: Burtis Junes, RN, ANP-C           EP Attending  Patient seen and examined. I have reviewed the findings as noted above. The patient presents with symptomatic bradycardia due to high grade AV block and Stokes Adams attacks. His AV nodal blocking drugs were stopped completely last week and he has persistent AV block. Yesterday he had complete heart block. He is now referred to consider PPM insertion. I have discussed the risks/benefits/goals/expectations of the procedure with the patient and he wishes to proceed.   Mikle Bosworth.D.

## 2015-06-24 NOTE — Consult Note (Signed)
CARDIOLOGY OFFICE NOTE  Date: 06/23/2015    Robert Anderson Date of Birth: [birth year] Medical Record #[id number]  PCP: Joycelyn Man, MD Cardiologist: Meda Coffee   Chief Complaint  Patient presents with  . Dizziness    Follow up visit - seen for Dr. Meda Coffee.   . Irregular Heart Beat    History of Present Illness: Robert Anderson is a 81 y.o. male who presents today for a follow up visit. Seen for Dr. Meda Coffee. This is a 5 day check. He has a history of severe AS s/p TAVR 03/25/15, HOCM, LBBB, 1st degree AVB, essential HTN, h/o TIA & CVA. LHC 02/2015: minor nonobstructive CAD. 2D echo 04/25/15: vigorous LV systolic function with near-cavity obliteration, moderate LVH, EF 65-70%, grade 1 DD, mod LAE.   He was seen for dizziness on 05/08/15 at which time his metoprolol was re-introduced given HOCM. He was advised to take 1/2 tablet daily for several days and if tolerated, increase to BID. He did so and initially felt OK. However, on Sunday 06/15/15 he was walking to the bathroom and suddenly felt worsening dizziness with blackened vision. He fell, striking his arm on the door frame. He is not sure if he lost consciousness. His wife came to his side right away and he was oriented. He decided to drop his metporolol back to once per day. Since that time he's had continued intermittent dizziness and sustained another fall last week where he just suddenly felt weak. He did not have syncope at that time. He denied any head injury with either event. He endorsed continued DOE. No chest pain. No bleeding reported.   Seen last week by Melina Copa, PA and HR was 34 by EKG - f/u readings 40 by manual pulse check. Surprisingly he said he was feeling better. EKG with 2:1 heart block noted with profound bradycardia. Metoprolol was stopped and he was to come back for repeat evaluation.  Comes back today. Here with wife and daughter. Still feels dizzy. HR still low. BP has improved. He has not  been able to return to the gym. Has had another fall since he was last here - from being too dizzy. No significant injury but had to see PCP due to his arm being scraped up. BP has come up nicely.   Past Medical History  Diagnosis Date  . Essential hypertension   . ERECTILE DYSFUNCTION   . HEARING LOSS   . TIA (transient ischemic attack) 01/21/2014    5 min spell aphasia without assoc sx   . CATARACTS, BILATERAL   . History of prostate cancer 10/08/2010  . Squamous cell skin cancer     left lower leg  . Severe aortic stenosis     a. s/p TAVR 03/2015. Pre-op LHC 02/2015: minor nonobstructive CAD.  Marland Kitchen Hypertrophic obstructive cardiomyopathy ([place])   . CVA (cerebral infarction) 02/19/2014    Small infarct high posterior left frontal lobe on MRI   . DEGENERATIVE JOINT DISEASE, SPINE   . OA (osteoarthritis)     both hips  . 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 LBBB (left bundle branch block)   . First degree AV block   . Bradycardia     a. HR 30s in clinic 06/2015 - BB discontinued.  Marland Kitchen Heart block     Past Surgical History  Procedure Laterality Date  . Prostate surgery    .  Joint replacement    . Hemiarthroplasty shoulder fracture    . Cataract extraction      right 2004, left 2010  . Cardiac catheterization N/A 02/20/2015    Procedure: Right/Left Heart Cath and Coronary Angiography; Surgeon: Sherren Mocha, MD; Location: [facility]; Service: Cardiovascular; Laterality: N/A;  . Tee without cardioversion N/A 03/07/2015    Procedure: TRANSESOPHAGEAL ECHOCARDIOGRAM (TEE); Surgeon: Dorothy Spark, MD; Location: [facility]; Service:  Cardiovascular; Laterality: N/A;  . Eye surgery    . Tonsillectomy    . Anterior cervical discectomy/fusion      10-12 yrs ago...Dr. Louanne Skye  . External radiation      2010 -- 40 treatments  . Transcatheter aortic valve replacement, transfemoral N/A 03/25/2015    Procedure: TRANSCATHETER AORTIC VALVE REPLACEMENT, TRANSFEMORAL; Surgeon: Burnell Blanks, MD; Location: [facility]; Service: Open Heart Surgery; Laterality: N/A;  . Tee without cardioversion N/A 03/25/2015    Procedure: TRANSESOPHAGEAL ECHOCARDIOGRAM (TEE); Surgeon: Burnell Blanks, MD; Location: [facility]; Service: Open Heart Surgery; Laterality: N/A;     Medications: Current Outpatient Prescriptions  Medication Sig Dispense Refill  . clopidogrel (PLAVIX) 75 MG tablet TAKE 1 TABLET (75 MG TOTAL) BY MOUTH DAILY. 90 tablet 3  . docusate sodium (COLACE) 100 MG capsule Take 200 mg by mouth every morning.     . pravastatin (PRAVACHOL) 10 MG tablet Take 1 tablet (10 mg total) by mouth at bedtime. 100 tablet 3  . tamsulosin (FLOMAX) 0.4 MG CAPS capsule Take 1 capsule (0.4 mg total) by mouth 3 (three) times a week. Monday Wednesday Fridday 100 capsule 3   No current facility-administered medications for this visit.    Allergies: No Known Allergies  Social History: The patient reports that he has never smoked. He does not have any smokeless tobacco history on file. He reports that he does not drink alcohol or use illicit drugs.  Family History: The patient's family history includes CVA in his father and mother; Cancer in his father and sister; Heart attack in his brother; Heart disease in his brother and mother; Lung cancer in his brother; Prostate cancer in his brother; Stroke in his mother. There is no history of Hypertension.   Review of Systems: Please see the history of present  illness. Otherwise, the review of systems is positive for none. All other systems are reviewed and negative.   Physical Exam: VS: BP 150/62 mmHg  Pulse 40  Ht 5\' 9"  (1.753 m)  Wt 216 lb 12.8 oz (98.34 kg)  BMI 32.00 kg/m2 . BMI Body mass index is 32 kg/(m^2).  Wt Readings from Last 3 Encounters:  06/23/15 216 lb 12.8 oz (98.34 kg)  06/18/15 216 lb (97.977 kg)  05/27/15 213 lb (96.616 kg)    General: Pleasant. Elderly male - looks chronically ill but in no acute distress. Left arm dressing in place.  HEENT: Normal.  Neck: Supple, no JVD, carotid bruits, or masses noted.  Cardiac: Irregular irregular rhythm. Harsh systolic murmur. Rate is slow.  Respiratory: Lungs are clear to auscultation bilaterally with normal work of breathing.  GI: Soft and nontender.  MS: No deformity or atrophy. Gait and ROM intact. Using a walker.  Skin: Warm and dry. Color is normal.  Neuro: Strength and sensation are intact and no gross focal deficits noted.  Psych: Alert, appropriate and with normal affect.   LABORATORY DATA:  EKG: EKG is ordered today. This demonstrates complete heart block - rate of 40 - reviewed with Dr. Lovena Le here in  the office.   Recent Labs    Lab Results  Component Value Date   WBC 9.1 06/18/2015   HGB 14.2 06/18/2015   HCT 42.8 06/18/2015   PLT 189 06/18/2015   GLUCOSE 97 06/18/2015   CHOL 158 05/20/2015   TRIG 161.0* 05/20/2015   HDL 45.10 05/20/2015   LDLDIRECT 134.9 05/03/2013   LDLCALC 80 05/20/2015   ALT 13 06/18/2015   AST 13 06/18/2015   NA 138 06/18/2015   K 4.6 06/18/2015   CL 107 06/18/2015   CREATININE 1.24* 06/18/2015   BUN 25 06/18/2015   CO2 24 06/18/2015   TSH 2.68 05/20/2015   PSA 0.07* 05/20/2015   INR 1.31 03/25/2015   HGBA1C  6.1* 03/21/2015      BNP (last 3 results)  Recent Labs (within last 365 days)    No results for input(s): BNP in the last 8760 hours.    ProBNP (last 3 results)  Recent Labs (within last 365 days)    No results for input(s): PROBNP in the last 8760 hours.     Other Studies Reviewed Today:  Echo Study Conclusions from 04/2015  - Left ventricle: Vigorous LV systolic function with near cavity obliteration. The cavity size was normal. Wall thickness was increased in a pattern of moderate LVH. Systolic function was vigorous. The estimated ejection fraction was in the range of 65% to 70%. Doppler parameters are consistent with abnormal left ventricular relaxation (grade 1 diastolic dysfunction). - Left atrium: The atrium was moderately dilated.   Assessment/Plan: 1. Persistent dizziness with prior 2:1 heart block - now off of metoprolol. It's hard to know if his original dizziness was due to periodic bradycardia or due to his HOCM/near-cavity obliteration. He looks to now be in complete heart block - reviewed with Dr. Lovena Le - will proceed with admission with plans for [facility] tomorrow per EP. Patient is on Plavix - will hold on Lovenox.  2. Essential HTN - BP has come up and is stable at present - this is off of metoprolol. May be able to restart once PPM in place.  3. HOCM - see above. 4. Severe AS s/p TAVR - echo in 04/2015 was stable. Maintained on Plavix. 5. Recent falls   Current medicines are reviewed with the patient today. The patient does not have concerns regarding medicines other than what has been noted above.  The following changes have been made: See above.  Labs/ tests ordered today include:   No orders of the defined types were placed in this encounter.    Disposition: To be admitted to [facility]. Labs tonight. PPM implant tomorrow - currently scheduled for 6pm with Dr. Lovena Le   Patient is agreeable to this plan and will call if any  problems develop in the interim.   Signed: Burtis Junes, RN, ANP-C           EP Attending  Patient seen and examined. I have reviewed the findings as noted above. The patient presents with symptomatic bradycardia due to high grade AV block and Stokes Adams attacks. His AV nodal blocking drugs were stopped completely last week and he has persistent AV block. Yesterday he had complete heart block. He is now referred to consider PPM insertion. I have discussed the risks/benefits/goals/expectations of the procedure with the patient and he wishes to proceed.   Mikle Bosworth.D.

## 2015-06-24 NOTE — Interval H&P Note (Signed)
History and Physical Interval Note:  06/24/2015 1:05 PM  Robert Anderson  has presented today for surgery, with the diagnosis of hb  The various methods of treatment have been discussed with the patient and family. After consideration of risks, benefits and other options for treatment, the patient has consented to  Procedure(s): Pacemaker Implant (N/A) as a surgical intervention .  The patient's history has been reviewed, patient examined, no change in status, stable for surgery.  I have reviewed the patient's chart and labs.  Questions were answered to the patient's satisfaction.     Cristopher Peru

## 2015-06-25 ENCOUNTER — Inpatient Hospital Stay (HOSPITAL_COMMUNITY)
Admission: EM | Admit: 2015-06-25 | Discharge: 2015-06-28 | Disposition: A | Discharge status: Home / Self Care | Payer: Medicare Other | Source: Home / Self Care | Attending: Cardiology | Admitting: Cardiology

## 2015-06-25 ENCOUNTER — Telehealth: Payer: Self-pay | Admitting: Internal Medicine

## 2015-06-25 ENCOUNTER — Encounter (HOSPITAL_COMMUNITY): Payer: Self-pay | Admitting: Internal Medicine

## 2015-06-25 ENCOUNTER — Observation Stay (HOSPITAL_BASED_OUTPATIENT_CLINIC_OR_DEPARTMENT_OTHER): Payer: Medicare Other

## 2015-06-25 ENCOUNTER — Other Ambulatory Visit: Payer: Self-pay

## 2015-06-25 ENCOUNTER — Inpatient Hospital Stay (HOSPITAL_COMMUNITY): Payer: Medicare Other

## 2015-06-25 DIAGNOSIS — R079 Chest pain, unspecified: Secondary | ICD-10-CM | POA: Diagnosis not present

## 2015-06-25 DIAGNOSIS — R071 Chest pain on breathing: Secondary | ICD-10-CM

## 2015-06-25 DIAGNOSIS — Z95818 Presence of other cardiac implants and grafts: Secondary | ICD-10-CM

## 2015-06-25 DIAGNOSIS — R0789 Other chest pain: Secondary | ICD-10-CM | POA: Diagnosis not present

## 2015-06-25 DIAGNOSIS — I313 Pericardial effusion (noninflammatory): Secondary | ICD-10-CM

## 2015-06-25 DIAGNOSIS — I442 Atrioventricular block, complete: Secondary | ICD-10-CM | POA: Diagnosis present

## 2015-06-25 DIAGNOSIS — I3139 Other pericardial effusion (noninflammatory): Secondary | ICD-10-CM

## 2015-06-25 DIAGNOSIS — I350 Nonrheumatic aortic (valve) stenosis: Secondary | ICD-10-CM

## 2015-06-25 DIAGNOSIS — I10 Essential (primary) hypertension: Secondary | ICD-10-CM

## 2015-06-25 DIAGNOSIS — I309 Acute pericarditis, unspecified: Secondary | ICD-10-CM | POA: Diagnosis present

## 2015-06-25 DIAGNOSIS — Z950 Presence of cardiac pacemaker: Secondary | ICD-10-CM

## 2015-06-25 HISTORY — DX: Unspecified atrial fibrillation: I48.91

## 2015-06-25 HISTORY — DX: Other pericardial effusion (noninflammatory): I31.39

## 2015-06-25 HISTORY — DX: Pericardial effusion (noninflammatory): I31.3

## 2015-06-25 LAB — CBC WITH DIFFERENTIAL/PLATELET
Basophils Absolute: 0 10*3/uL (ref 0.0–0.1)
Basophils Relative: 0 %
EOS ABS: 0 10*3/uL (ref 0.0–0.7)
Eosinophils Relative: 0 %
HEMATOCRIT: 40.8 % (ref 39.0–52.0)
HEMOGLOBIN: 13.3 g/dL (ref 13.0–17.0)
LYMPHS ABS: 0.8 10*3/uL (ref 0.7–4.0)
Lymphocytes Relative: 7 %
MCH: 29.4 pg (ref 26.0–34.0)
MCHC: 32.6 g/dL (ref 30.0–36.0)
MCV: 90.1 fL (ref 78.0–100.0)
MONO ABS: 0.5 10*3/uL (ref 0.1–1.0)
MONOS PCT: 4 %
NEUTROS PCT: 89 %
Neutro Abs: 10.3 10*3/uL — ABNORMAL HIGH (ref 1.7–7.7)
Platelets: 167 10*3/uL (ref 150–400)
RBC: 4.53 MIL/uL (ref 4.22–5.81)
RDW: 13.2 % (ref 11.5–15.5)
WBC: 11.5 10*3/uL — ABNORMAL HIGH (ref 4.0–10.5)

## 2015-06-25 LAB — BASIC METABOLIC PANEL
Anion gap: 7 (ref 5–15)
BUN: 25 mg/dL — AB (ref 6–20)
CHLORIDE: 106 mmol/L (ref 101–111)
CO2: 23 mmol/L (ref 22–32)
CREATININE: 1.35 mg/dL — AB (ref 0.61–1.24)
Calcium: 9.3 mg/dL (ref 8.9–10.3)
GFR calc Af Amer: 52 mL/min — ABNORMAL LOW (ref >60)
GFR calc non Af Amer: 45 mL/min — ABNORMAL LOW (ref >60)
Glucose, Bld: 190 mg/dL — ABNORMAL HIGH (ref 65–99)
Potassium: 4.7 mmol/L (ref 3.5–5.1)
SODIUM: 136 mmol/L (ref 135–145)

## 2015-06-25 LAB — TROPONIN I: Troponin I: 0.03 ng/mL (ref <0.031)

## 2015-06-25 MED ORDER — PRAVASTATIN SODIUM 20 MG PO TABS
10.0000 mg | ORAL_TABLET | Freq: Every day | ORAL | Status: DC
  Administered 2015-06-25 – 2015-06-27 (×3): 10 mg via ORAL
  Filled 2015-06-25 (×3): qty 1

## 2015-06-25 MED ORDER — MORPHINE SULFATE (PF) 4 MG/ML IV SOLN
4.0000 mg | Freq: Once | INTRAVENOUS | Status: AC
  Administered 2015-06-25: 4 mg via INTRAVENOUS
  Filled 2015-06-25: qty 1

## 2015-06-25 MED ORDER — METOPROLOL TARTRATE 25 MG PO TABS: 12.5000 mg | ORAL_TABLET | Freq: Two times a day (BID) | ORAL | Status: DC

## 2015-06-25 MED ORDER — COLCHICINE 0.6 MG PO TABS
0.6000 mg | ORAL_TABLET | Freq: Once | ORAL | Status: AC
  Administered 2015-06-25: 0.6 mg via ORAL
  Filled 2015-06-25: qty 1

## 2015-06-25 MED ORDER — YOU HAVE A PACEMAKER BOOK
Freq: Once | Status: AC
  Administered 2015-06-25: 06:00:00
  Filled 2015-06-25 (×2): qty 1

## 2015-06-25 MED ORDER — FENTANYL CITRATE (PF) 100 MCG/2ML IJ SOLN
25.0000 ug | INTRAMUSCULAR | Status: DC | PRN
  Administered 2015-06-25: 25 ug via INTRAVENOUS
  Filled 2015-06-25: qty 2

## 2015-06-25 MED ORDER — OXYCODONE HCL 5 MG PO TABS
5.0000 mg | ORAL_TABLET | ORAL | Status: DC | PRN
  Administered 2015-06-25 – 2015-06-26 (×5): 5 mg via ORAL
  Filled 2015-06-25 (×5): qty 1

## 2015-06-25 MED ORDER — ACETAMINOPHEN 325 MG PO TABS: 650.0000 mg | ORAL_TABLET | ORAL | Status: DC | PRN

## 2015-06-25 MED ORDER — TAMSULOSIN HCL 0.4 MG PO CAPS
0.4000 mg | ORAL_CAPSULE | ORAL | Status: DC
  Administered 2015-06-27: 0.4 mg via ORAL
  Filled 2015-06-25: qty 1

## 2015-06-25 MED ORDER — ONDANSETRON HCL 4 MG/2ML IJ SOLN: 4.0000 mg | Freq: Four times a day (QID) | INTRAMUSCULAR | Status: DC | PRN

## 2015-06-25 NOTE — ED Notes (Signed)
Pt.; was discharged this morning after having a pacemaker placed.  He is having chest pain with each beat.  Describes at as throbbing. Pt. Continues to have lightheadeness .  Dr. Lovena Le is aware that the pt. Is coming.  He placed the pacer

## 2015-06-25 NOTE — Telephone Encounter (Signed)
Received a call from Bing Ree, [place], she has talked with the patient and he told her he is having pain with inspiration.  She has asked that he come back to the ER to be evaluated.

## 2015-06-25 NOTE — Discharge Summary (Signed)
ELECTROPHYSIOLOGY PROCEDURE DISCHARGE SUMMARY    Patient ID: Robert Anderson,  MRN: [id number], DOB/AGE: [birth year] 81 y.o.  Admit date: 06/23/2015 Discharge date: 06/25/2015  Primary Care Physician: Evette Georges, MD Primary Cardiologist: Dr. Delton See Electrophysiologist: Dr. Ladona Ridgel  Primary Discharge Diagnosis:  Symptomatic bradycardia status post pacemaker implantation this admission  (the patient denies any episodes of syncope, only episodes of weakness/dizziness associated with falls)  Secondary Discharge Diagnosis:  Severe AS s/p TAVR (03/25/15) HOCM HTN  No Known Allergies   Procedures This Admission:  1.  Implantation of a STJ dual chamber PPM on 06/24/15 by Dr Ladona Ridgel.  The patient received a St. Jude (serial number [id number]) pacemaker with [employer]. Jude (serial number [id number]) right atrial lead and a St. Jude (serial number [id number]) right ventricular lead  There were no immediate post procedure complications. 2.  CXR on 06/25/15 demonstrated no pneumothorax status post device implantation.   Brief HPI: Robert Anderson is a 81 y.o. male was referred to [facility] from the office with symptomatic bradycardia and CHB for PPM implantation.  Past medical history includes aortic stenosis with TAVR in August of this year, HTN, HOCM.  The patient has had symptomatic bradycardia without no reversible causes identified.  Risks, benefits, and alternatives to PPM implantation were reviewed with the patient who wished to proceed.   Hospital Course:  The patient was admitted from the office 06/23/15 with c/o peristant fatigue/weakness, falls and dizzy spells.  He had been seen last week and noted to have 2:1 AVblock with HR 30's and his Metoprolol was stopped, he returned for a f/u visit on the day of admission with ongoing symptoms despite no betablocker and was found to be in CHB.  He underwent implantation of a STJ PPM with details as outlined above yesterday.  He  was monitored on telemetry overnight  which demonstrated AVpacing  Left chest was without hematoma or ecchymosis.  The device was interrogated and found to be functioning normally.  CXR was obtained and demonstrated no pneumothorax status post device implantation.  His BP has been somewhat high and outpatient notes mention that Dr. Sanjuana Kava recommended he be on low dose betablocker,  we Robert Anderson resume his Metoprolol at it's last dose of 25mg  1/2 tab BID (he has the medication at home) . He is asked to monitor for any recurrent symptoms closely with resumption of this medicine noting at one of his outpatient visits he had some orthostatic changes (while in 2:1 heart block and bradycardic).  Wound care, arm mobility, and restrictions were reviewed with the patient.  The patient's RN inquired about PT evaluation for the patient though the patient declined the evaluation, states he routinely exercises on a stationary bike and goes to the [facility] for exercise, though just not in the last week or so feeling weaker then usual with falls/dizzy spells, most likely secondary to his bradycardia. The patient was examined by Dr. Elberta Fortis and considered stable for discharge to home.  He reports feeling well this morning, denies any CP, palpitations or SOB.  He has not felt dizzy here.  He is instructed to resume activity slowly and follow up with his PMD as well, he has a visit with his cardiologist in the next couple weeks as well. I have instructed the patient/family he is not to drive until cleared by cardiology service, with what sounds a possible syncopal episode some weeks ago.   Physical Exam: Filed Vitals:   06/24/15 1658 06/24/15 2126 06/25/15 0441 06/25/15 4696  BP: 136/45 154/63 152/46   Pulse: 68 61 62 68  Temp: 97.7 F (36.5 C) 97.8 F (36.6 C) 97.7 F (36.5 C) 97.7 F (36.5 C)  TempSrc: Oral Oral Oral Oral  Resp: 16 25 20 18   Height:      Weight:   214 lb 15.2 oz (97.5 kg)   SpO2: 96% 93% 95%     GEN- The patient is well appearing, alert  and oriented x 3 today.   HEENT: normocephalic, atraumatic; sclera clear, conjunctiva pink; hearing intact; oropharynx clear; neck supple, no JVP Lungs- Clear to ausculation bilaterally, normal work of breathing.  No wheezes, rales, rhonchi Heart- Regular rate and rhythm, no murmurs, rubs or gallops GI- soft, non-tender, non-distended, bowel sounds present Extremities- no clubbing, cyanosis, or edema MS- no significant deformity or atrophy Skin- warm and dry, no rash or lesion, left chest without hematoma/ecchymosis Psych- euthymic mood, full affect Neuro- no gross deficits   Labs:   Lab Results  Component Value Date   WBC 7.5 06/23/2015   HGB 14.4 06/23/2015   HCT 43.7 06/23/2015   MCV 91.0 06/23/2015   PLT 176 06/23/2015     Recent Labs Lab 06/23/15 1723  NA 138  K 4.4  CL 106  CO2 26  BUN 25*  CREATININE 1.22  CALCIUM 9.3  PROT 6.4*  BILITOT 0.6  ALKPHOS 62  ALT 14*  AST 17  GLUCOSE 97    Discharge Medications:    Medication List    TAKE these medications        clopidogrel 75 MG tablet  Commonly known as:  PLAVIX  TAKE 1 TABLET (75 MG TOTAL) BY MOUTH DAILY.     docusate sodium 100 MG capsule  Commonly known as:  COLACE  Take 200 mg by mouth daily.     ibuprofen 600 MG tablet  Commonly known as:  ADVIL,MOTRIN  Take 600 mg by mouth daily as needed (pain).     metoprolol tartrate 25 MG tablet  Commonly known as:  LOPRESSOR  Take 0.5 tablets (12.5 mg total) by mouth 2 (two) times daily.     pravastatin 10 MG tablet  Commonly known as:  PRAVACHOL  Take 1 tablet (10 mg total) by mouth at bedtime.     tamsulosin 0.4 MG Caps capsule  Commonly known as:  FLOMAX  Take 1 capsule (0.4 mg total) by mouth 3 (three) times a week. Monday Wednesday Fridday        Disposition: Home Discharge Instructions    Diet - low sodium heart healthy    Complete by:  As directed      Increase activity slowly    Complete by:  As directed           Follow-up  Information    Follow up with [facility] On 07/03/2015.   Specialty:  Cardiology   Why:  11:30AM wound check   Contact information:   [address] [place] [address] [phone]      Follow up with Lewayne Bunting, MD On 09/25/2015.   Specialty:  Cardiology   Why:  11;45   Contact information:   [address] [place] [address] [phone]       Duration of Discharge Encounter: Greater than 30 minutes including physician time.  SignedFrancis Dowse, PA-C 06/25/2015 10:38 AM   I have seen and examined this patient with Francis Dowse.  Agree with above, note added  to reflect my findings.  On exam, regular rhythm, no murmurs.  Had  Pacemaker placed yesterday due to complete heart block. Chest x-ray today shows no acute abnormalities and stable pacemaker leads. Interrogation shows no acute abnormalities a pacing 36% with ventricular pacing. We'll plan to discharge today with follow-up in device clinic in 10-14 days. Have discussed wound care and activity restrictions as well.  Robert Anderson M. Meleny Tregoning MD 06/25/2015 2:11 PM

## 2015-06-25 NOTE — Progress Notes (Signed)
  Echocardiogram 2D Echocardiogram has been performed.  Robert Anderson 06/25/2015, 5:38 PM

## 2015-06-25 NOTE — Discharge Instructions (Signed)
° ° °  Supplemental Discharge Instructions for  Pacemaker/Defibrillator Patients  Activity No heavy lifting or vigorous activity with your left arm for 6 to 8 weeks.  Do not raise your left arm above your head for one week.  Gradually raise your affected arm as drawn below.          06/28/15                    06/29/15                   06/30/15               07/01/15  NO DRIVING  until cleared by cardiologist  ; you may begin driving on  [place] the wound area clean and dry.  Do not get this area wet for one week. No showers for one week; you may shower on 07/01/15    . - The tape/steri-strips on your wound will fall off; do not pull them off.  No bandage is needed on the site.  DO  NOT apply any creams, oils, or ointments to the wound area. - If you notice any drainage or discharge from the wound, any swelling or bruising at the site, or you develop a fever > 101? F after you are discharged home, call the office at once.  Special Instructions - You are still able to use cellular telephones; use the ear opposite the side where you have your pacemaker/defibrillator.  Avoid carrying your cellular phone near your device. - When traveling through airports, show security personnel your identification card to avoid being screened in the metal detectors.  Ask the security personnel to use the hand wand. - Avoid arc welding equipment, MRI testing (magnetic resonance imaging), TENS units (transcutaneous nerve stimulators).  Call the office for questions about other devices. - Avoid electrical appliances that are in poor condition or are not properly grounded. - Microwave ovens are safe to be near or to operate.  Additional information for defibrillator patients should your device go off: - If your device goes off ONCE and you feel fine afterward, notify the device clinic nurses. - If your device goes off ONCE and you do not feel well afterward, call 911. - If your device goes off TWICE,  call 911. - If your device goes off THREE times in one day, call 911.  DO NOT DRIVE YOURSELF OR A FAMILY MEMBER WITH A DEFIBRILLATOR TO THE HOSPITAL--CALL 911.

## 2015-06-25 NOTE — ED Provider Notes (Signed)
Unfortunately the patient left the emergency department prior to his portable chest x-ray.  Vital signs are stable.  I discussed the case with Ms. Idolina Primer the on-call physician assistant who will follow-up on his chest x-ray on the floor.  We have contacted nursing staff on the floor and they're aware that the chest x-ray needs to be completed.   Jola Schmidt, MD 06/25/15 1820

## 2015-06-25 NOTE — ED Notes (Signed)
Pts pace maker being interrogated at the moment.

## 2015-06-25 NOTE — ED Provider Notes (Signed)
CSN: [id number]     Arrival date & time 06/25/15  1548 History   First MD Initiated Contact with Patient 06/25/15 1610     Chief Complaint  Patient presents with  . Chest Pain      HPI Patient presents to the emergency department complaining of pleuritic anterior left-sided chest pain and some pain with range of motion of his left shoulder since time of discharge yesterday where he underwent permanent pacemaker placement in his left upper chest.  He denies shortness of breath.  Denies orthopnea.  He called the electrophysiology team who recommended that he come to the ER for evaluation.  Denies fevers or chills.  No productive cough.  He reports that his pain is persistently increased since this morning.  No other complaints.   Past Medical History  Diagnosis Date  . Essential hypertension   . ERECTILE DYSFUNCTION   . HEARING LOSS   . TIA (transient ischemic attack) 01/21/2014    5 min spell aphasia without assoc sx    . Severe aortic stenosis     a. s/p TAVR 03/2015. Pre-op  LHC 02/2015: minor nonobstructive CAD.  Marland Kitchen Hypertrophic obstructive cardiomyopathy ([place])   . Stroke ([place]) 02/19/2014    Small infarct high posterior left frontal lobe on MRI   . 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 LBBB (left bundle branch block)   . First degree AV block   . Bradycardia     a. HR 30s in clinic 06/2015 - BB discontinued.  Marland Kitchen Heart block   . DEGENERATIVE JOINT DISEASE, SPINE   . OA (osteoarthritis)     both hips  . Hyperlipidemia   . Presence of permanent cardiac pacemaker 06/24/2015  . Squamous cell skin cancer     left lower leg  . Prostate cancer [facility]) 2010    "external radiation; 40 treatments"   Past Surgical History  Procedure Laterality Date  . Joint replacement    . Cataract extraction w/ intraocular lens  implant, bilateral  2004; 2010    right; left  . Tee  without cardioversion N/A 03/07/2015    Procedure: TRANSESOPHAGEAL ECHOCARDIOGRAM (TEE);  Surgeon: Dorothy Spark, MD;  Location: [place];  Service: Cardiovascular;  Laterality: N/A;  . Total shoulder arthroplasty Right 2007    "wore it out playing tennis"  . Tonsillectomy    . Anterior cervical decomp/discectomy fusion  ~ 2000    Dr. Louanne Skye  . Transcatheter aortic valve replacement, transfemoral N/A 03/25/2015    Procedure: TRANSCATHETER AORTIC VALVE REPLACEMENT, TRANSFEMORAL;  Surgeon: Burnell Blanks, MD;  Location: [place];  Service: Open Heart Surgery;  Laterality: N/A;  . Tee without cardioversion N/A 03/25/2015    Procedure: TRANSESOPHAGEAL ECHOCARDIOGRAM (TEE);  Surgeon: Burnell Blanks, MD;  Location: [place];  Service: Open Heart Surgery;  Laterality: N/A;  . Back surgery    . Cardiac catheterization N/A 02/20/2015    Procedure: Right/Left Heart Cath and Coronary Angiography;  Surgeon: Sherren Mocha, MD;  Location: [place] CV LAB;  Service: Cardiovascular;  Laterality: N/A;  . Cardiac valve replacement    . Insert / replace / remove pacemaker  06/24/2015  . Squamous cell carcinoma excision Left X 3    "lower leg"  . Ep implantable device N/A 06/24/2015    Procedure: Pacemaker Implant;  Surgeon: Evans Lance, MD;  Location: [place] CV LAB;  Service: Cardiovascular;  Laterality: N/A;   Family History  Problem Relation Age of Onset  . Heart disease Mother   . Cancer Father   . Cancer Sister   . Prostate cancer Brother   . Heart disease Brother   . Lung cancer Brother   . CVA Mother   . CVA Father   . Heart attack Brother   . Hypertension Neg Hx   . Stroke Mother    Social History  Substance Use Topics  . Smoking status: Never Smoker   . Smokeless tobacco: Never Used  . Alcohol Use: No    Review of Systems  All other systems reviewed and are negative.     Allergies  Review of patient's allergies indicates no known allergies.  Home  Medications   Prior to Admission medications   Medication Sig Start Date End Date Taking? Authorizing Provider  clopidogrel (PLAVIX) 75 MG tablet TAKE 1 TABLET (75 MG TOTAL) BY MOUTH DAILY. Patient taking differently: Take 75 mg by mouth daily.  05/27/15   Dorena Cookey, MD  docusate sodium (COLACE) 100 MG capsule Take 200 mg by mouth daily.     Historical Provider, MD  ibuprofen (ADVIL,MOTRIN) 600 MG tablet Take 600 mg by mouth daily as needed (pain).    Historical Provider, MD  metoprolol tartrate (LOPRESSOR) 25 MG tablet Take 0.5 tablets (12.5 mg total) by mouth 2 (two) times daily. 06/25/15   Renee Dyane Dustman, PA-C  pravastatin (PRAVACHOL) 10 MG tablet Take 1 tablet (10 mg total) by mouth at bedtime. 05/27/15   Dorena Cookey, MD  tamsulosin (FLOMAX) 0.4 MG CAPS capsule Take 1 capsule (0.4 mg total) by mouth 3 (three) times a week. Monday Wednesday Fridday Patient taking differently: Take 0.4 mg by mouth 3 (three) times a week. Monday, Wednesday and Friday at bedtime 05/27/15   Dorena Cookey, MD   BP 101/52 mmHg  Pulse 71  Temp(Src) 98 F (36.7 C) (Oral)  Resp 26  SpO2 94% Physical Exam  Constitutional: He is oriented to person, place, and time. He appears well-developed and well-nourished.  HENT:  Head: Normocephalic and atraumatic.  Eyes: EOM are normal.  Neck: Normal range of motion.  Cardiovascular: Normal rate, regular rhythm, normal heart sounds and intact distal pulses.   Pulmonary/Chest: Effort normal and breath sounds normal. No respiratory distress.  Post procedural wounds in left upper chest with small hematoma and surrounding erythema or drainage.   Abdominal: Soft. He exhibits no distension. There is no tenderness.  Musculoskeletal: Normal range of motion.  Pain in left chest with ROM of left arm and shoulder. Normal left radial pulse  Neurological: He is alert and oriented to person, place, and time.  Skin: Skin is warm and dry.  Psychiatric: He has a normal mood  and affect. Judgment normal.  Nursing note and vitals reviewed.   ED Course  Procedures (including critical care time) Labs Review Labs Reviewed  CBC WITH DIFFERENTIAL/PLATELET  BASIC METABOLIC PANEL  TROPONIN I    Imaging Review Dg Chest 2 View  06/25/2015  CLINICAL DATA:  Status post permanent pacemaker placement EXAM: CHEST  2 VIEW COMPARISON:  Chest x-ray of June 23, 2015 FINDINGS: The patient has undergone permanent pacemaker placement. Radiographic positioning of the electrodes and of the generator is good. The lungs are adequately inflated. There is no pneumothorax or pleural effusion. The cardiac silhouette remains enlarged. The prosthetic aortic valve cage is visible. The pulmonary vascularity is mildly prominent centrally but stable.  The bony thorax exhibits no acute abnormality. IMPRESSION: There is no postprocedure complication following permanent pacemaker placement. There is stable enlargement of the cardiac silhouette without significant pulmonary vascular congestion. Electronically Signed   By: David  Martinique M.D.   On: 06/25/2015 07:23   X-ray Chest Pa And Lateral  06/23/2015  CLINICAL DATA:  Increased dizziness for 2 weeks with 1 fall, preoperative pacemaker. Diagnosed with bradycardia today. EXAM: CHEST  2 VIEW COMPARISON:  Chest x-ray dated 03/26/2015. FINDINGS: Cardiomegaly is unchanged. Aortic valve hardware appears stable in position. Central pulmonary vascular congestion is unchanged, probably chronic. More peripheral lungs are clear. No pleural effusion seen. No pneumothorax seen. Mild degenerative change again noted within the thoracic spine. No acute osseous abnormality. IMPRESSION: Fairly marked cardiomegaly, stable. Central pulmonary vascular congestion is unchanged, probably a chronic mild congestive heart failure. No frank pulmonary edema pattern. Lungs otherwise clear. Electronically Signed   By: Franki Cabot M.D.   On: 06/23/2015 18:34   I have personally  reviewed and evaluated these images and lab results as part of my medical decision-making.   EKG Interpretation   Date/Time:  Wednesday June 25 2015 15:55:28 EST Ventricular Rate:  71 PR Interval:  180 QRS Duration: 182 QT Interval:  474 QTC Calculation: 515 R Axis:   -82 Text Interpretation:  ATRIAL SENSING Ventricular-paced rhythm Abnormal ECG  When compared with ECG of 06/25/2015, ATRIAL SENSING VENTRICULAR PACED  RHYTHM has replaced AV dual-paced rhythm Confirmed by [facility]  MD, DAVID  ([id number]) on 06/25/2015 4:01:02 PM      MDM   Final diagnoses:  Chest pain, unspecified chest pain type   Pt seen and evaluated by Dr Curt Bears, EP, who is concerned based on bedside US for possible small pericardial effusion. Recommends admission, culturing, chest x-ray, formal 2-D echo.  Pain treated.  Temporary admission orders placed.  I spoke with the EP interrogation team who interrogated his pacemaker and states that his settings and readings are the same as they were at time of discharge.      Jola Schmidt, MD 06/25/15 [phone]

## 2015-06-25 NOTE — H&P (Signed)
EP CONSULT NOTE     Primary Care Physician: Joycelyn Man, MD Referring Physician:  Admit Date: 06/25/2015  Reason for consultation:  Robert Anderson is a 81 y.o. male with a h/o TIA, severe aortic stenosis status post TAVR, CVA, complete heart block status post dual-chamber pacemaker. The patient was doing well and was discharged today but has since developed chest pain. In the emergency room his chest pain continues. He says the pain is ongoing worse with deep inspiration and laying flat, improved when he sits up. His interrogation from this morning shows that the device was working without any issues and stable lead parameters. A bedside echocardiogram today showed a small pericardial effusion.    Today, he denies symptoms of palpitations, chest pain, shortness of breath, orthopnea, PND, lower extremity edema, dizziness, presyncope, syncope, or neurologic sequela. The patient is tolerating medications without difficulties and is otherwise without complaint today.   Past Medical History  Diagnosis Date  . Essential hypertension   . ERECTILE DYSFUNCTION   . HEARING LOSS   . TIA (transient ischemic attack) 01/21/2014    5 min spell aphasia without assoc sx    . Severe aortic stenosis     a. s/p TAVR 03/2015. Pre-op  LHC 02/2015: minor nonobstructive CAD.  Marland Kitchen Hypertrophic obstructive cardiomyopathy ([place])   . Stroke ([place]) 02/19/2014    Small infarct high posterior left frontal lobe on MRI   . 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 LBBB (left bundle branch block)   . First degree AV block   . Bradycardia     a. HR 30s in clinic 06/2015 - BB discontinued.  Marland Kitchen Heart block   . DEGENERATIVE JOINT DISEASE, SPINE   . OA (osteoarthritis)     both hips  . Hyperlipidemia   . Presence of permanent cardiac pacemaker 06/24/2015  . Squamous cell skin cancer     left lower leg  .  Prostate cancer [facility]) 2010    "external radiation; 40 treatments"   Past Surgical History  Procedure Laterality Date  . Joint replacement    . Cataract extraction w/ intraocular lens  implant, bilateral  2004; 2010    right; left  . Tee without cardioversion N/A 03/07/2015    Procedure: TRANSESOPHAGEAL ECHOCARDIOGRAM (TEE);  Surgeon: Dorothy Spark, MD;  Location: [place];  Service: Cardiovascular;  Laterality: N/A;  . Total shoulder arthroplasty Right 2007    "wore it out playing tennis"  . Tonsillectomy    . Anterior cervical decomp/discectomy fusion  ~ 2000    Dr. Louanne Skye  . Transcatheter aortic valve replacement, transfemoral N/A 03/25/2015    Procedure: TRANSCATHETER AORTIC VALVE REPLACEMENT, TRANSFEMORAL;  Surgeon: Burnell Blanks, MD;  Location: [place];  Service: Open Heart Surgery;  Laterality: N/A;  . Tee without cardioversion N/A 03/25/2015    Procedure: TRANSESOPHAGEAL ECHOCARDIOGRAM (TEE);  Surgeon: Burnell Blanks, MD;  Location: [place];  Service: Open Heart Surgery;  Laterality: N/A;  . Back surgery    . Cardiac catheterization N/A 02/20/2015    Procedure: Right/Left Heart Cath and Coronary Angiography;  Surgeon: Sherren Mocha, MD;  Location: [place] CV LAB;  Service: Cardiovascular;  Laterality: N/A;  . Cardiac valve replacement    . Insert / replace / remove pacemaker  06/24/2015  . Squamous cell carcinoma excision Left X 3    "lower leg"  . Ep implantable  device N/A 06/24/2015    Procedure: Pacemaker Implant;  Surgeon: Evans Lance, MD;  Location: [place] CV LAB;  Service: Cardiovascular;  Laterality: N/A;    . colchicine  0.6 mg Oral Once  .  morphine injection  4 mg Intravenous Once      No Known Allergies  Social History   Social History  . Marital Status: Married    Spouse Name: N/A  . Number of Children: N/A  . Years of Education: N/A   Occupational History  . Not on file.   Social History Main Topics  . Smoking status: Never  Smoker   . Smokeless tobacco: Never Used  . Alcohol Use: No  . Drug Use: No  . Sexual Activity: Not Currently   Other Topics Concern  . Not on file   Social History Narrative    Family History  Problem Relation Age of Onset  . Heart disease Mother   . Cancer Father   . Cancer Sister   . Prostate cancer Brother   . Heart disease Brother   . Lung cancer Brother   . CVA Mother   . CVA Father   . Heart attack Brother   . Hypertension Neg Hx   . Stroke Mother     ROS- All systems are reviewed and negative except as per the HPI above  Physical Exam: Telemetry: Filed Vitals:   06/25/15 1555 06/25/15 1623  BP: 102/51 115/62  Pulse: 72 64  Temp: 98 F (36.7 C)   TempSrc: Oral   Resp: 16 16  SpO2: 93% 96%    GEN- The patient is well appearing, alert and oriented x 3 today.   Head- normocephalic, atraumatic Eyes-  Sclera clear, conjunctiva pink Ears- hearing intact Oropharynx- clear Neck- supple, no JVP Lymph- no cervical lymphadenopathy Lungs- Clear to ausculation bilaterally, normal work of breathing Heart- Regular rate and rhythm, no murmurs, rubs or gallops, PMI not laterally displaced GI- soft, NT, ND, + BS Extremities- no clubbing, cyanosis, or edema MS- no significant deformity or atrophy Skin- no rash or lesion Psych- euthymic mood, full affect Neuro- strength and sensation are intact  EKG: A sense, V paced rate 71  Labs:   Lab Results  Component Value Date   WBC 7.5 06/23/2015   HGB 14.4 06/23/2015   HCT 43.7 06/23/2015   MCV 91.0 06/23/2015   PLT 176 06/23/2015    Recent Labs Lab 06/23/15 1723  NA 138  K 4.4  CL 106  CO2 26  BUN 25*  CREATININE 1.22  CALCIUM 9.3  PROT 6.4*  BILITOT 0.6  ALKPHOS 62  ALT 14*  AST 17  GLUCOSE 97   No results found for: CKTOTAL, CKMB, CKMBINDEX, TROPONINI Lab Results  Component Value Date   CHOL 158 05/20/2015   CHOL 188 01/22/2014   CHOL 206* 05/03/2013   Lab Results  Component Value Date    HDL 45.10 05/20/2015   HDL 39* 01/22/2014   HDL 42.70 05/03/2013   Lab Results  Component Value Date   LDLCALC 80 05/20/2015   LDLCALC 85 01/22/2014   LDLCALC 127* 10/08/2010   Lab Results  Component Value Date   TRIG 161.0* 05/20/2015   TRIG 321* 01/22/2014   TRIG 184.0* 05/03/2013   Lab Results  Component Value Date   CHOLHDL 3 05/20/2015   CHOLHDL 4.8 01/22/2014   CHOLHDL 5 05/03/2013   Lab Results  Component Value Date   LDLDIRECT 134.9 05/03/2013  ASSESSMENT AND PLAN:   1. Chest pain: Pain in the setting of a new pacemaker placed yesterday which is worse with deep inspiration and lying flat. Echo shows a new effusion. This is likely caused by perforation of one of the 2 leads. We Liam Cammarata get a formal echo tonight as well as a chest x-ray. We Allsion Nogales also give him colchicine for pericardial pain and morphine for breakthrough pain. We Jamika Sadek plan on lead revision tomorrow. Should he become unstable overnight he may need an emergent pericardiocentesis.  2. Essential hypertension: Blood pressure is currently 115/62 we Dantre Yearwood plan to hold his blood pressure medications overnight prior to his procedure tomorrow.  3. Severe aortic stenosis status post TAVR: We'll hold the Plavix overnight as he is potentially bleeding into his pericardium.  Fidencia Mccloud Meredith Leeds, MD 06/25/2015  4:23 PM

## 2015-06-25 NOTE — Telephone Encounter (Signed)
Spoke with wife and this pain is probably due to his incision as he describes the pain as he feels when his heart beats.  Almost sounds like a throbbing pain.  He has not eaten and I have asked that he get something to eat and and take some Tylenol for pain.  The wife was speaking with patient and they agree with plan.  I will call them back this afternoon to see if this has helped

## 2015-06-25 NOTE — ED Notes (Signed)
Echo at bedside

## 2015-06-25 NOTE — Telephone Encounter (Signed)
New message      Pt c/o of Chest Pain: STAT if CP now or developed within 24 hours  1. Are you having CP right now? yes 2. Are you experiencing any other symptoms (ex. SOB, nausea, vomiting, sweating)? no 3. How long have you been experiencing CP?  Couple of hours  4. Is your CP continuous or coming and going?  Comes and goes  5. Have you taken Nitroglycerin?  Pt had pacemaker put in yesterday---just home from hospital today?

## 2015-06-26 ENCOUNTER — Encounter (HOSPITAL_COMMUNITY)
Admission: EM | Disposition: A | Discharge status: Home / Self Care | Payer: Medicare Other | Source: Home / Self Care | Attending: Cardiology

## 2015-06-26 DIAGNOSIS — T82120A Displacement of cardiac electrode, initial encounter: Secondary | ICD-10-CM

## 2015-06-26 DIAGNOSIS — R0781 Pleurodynia: Secondary | ICD-10-CM

## 2015-06-26 HISTORY — PX: EP IMPLANTABLE DEVICE: SHX172B

## 2015-06-26 LAB — BASIC METABOLIC PANEL
ANION GAP: 9 (ref 5–15)
BUN: 24 mg/dL — AB (ref 6–20)
CALCIUM: 9.3 mg/dL (ref 8.9–10.3)
CO2: 22 mmol/L (ref 22–32)
Chloride: 104 mmol/L (ref 101–111)
Creatinine, Ser: 1.1 mg/dL (ref 0.61–1.24)
GFR calc Af Amer: >60 mL/min (ref >60)
GFR calc non Af Amer: 58 mL/min — ABNORMAL LOW (ref >60)
GLUCOSE: 142 mg/dL — AB (ref 65–99)
Potassium: 4.2 mmol/L (ref 3.5–5.1)
Sodium: 135 mmol/L (ref 135–145)

## 2015-06-26 LAB — CBC
HCT: 39.2 % (ref 39.0–52.0)
HEMOGLOBIN: 12.8 g/dL — AB (ref 13.0–17.0)
MCH: 29.4 pg (ref 26.0–34.0)
MCHC: 32.7 g/dL (ref 30.0–36.0)
MCV: 89.9 fL (ref 78.0–100.0)
Platelets: 152 10*3/uL (ref 150–400)
RBC: 4.36 MIL/uL (ref 4.22–5.81)
RDW: 13.3 % (ref 11.5–15.5)
WBC: 11.5 10*3/uL — ABNORMAL HIGH (ref 4.0–10.5)

## 2015-06-26 SURGERY — LEAD REVISION/REPAIR
Anesthesia: LOCAL

## 2015-06-26 MED ORDER — LIDOCAINE-EPINEPHRINE 1 %-1:100000 IJ SOLN
INTRAMUSCULAR | Status: DC | PRN
  Administered 2015-06-26: 41 mL

## 2015-06-26 MED ORDER — ACETAMINOPHEN 325 MG PO TABS: 325.0000 mg | ORAL_TABLET | ORAL | Status: DC | PRN

## 2015-06-26 MED ORDER — CHLORHEXIDINE GLUCONATE 4 % EX LIQD
60.0000 mL | Freq: Once | CUTANEOUS | Status: AC
  Administered 2015-06-26: 4 via TOPICAL
  Filled 2015-06-26: qty 60

## 2015-06-26 MED ORDER — SODIUM CHLORIDE 0.9 % IR SOLN
80.0000 mg | Status: AC
  Administered 2015-06-26: 80 mg
  Filled 2015-06-26: qty 2

## 2015-06-26 MED ORDER — LIDOCAINE-EPINEPHRINE 1 %-1:100000 IJ SOLN
INTRAMUSCULAR | Status: AC
  Filled 2015-06-26: qty 1

## 2015-06-26 MED ORDER — MIDAZOLAM HCL 5 MG/5ML IJ SOLN
INTRAMUSCULAR | Status: DC | PRN
  Administered 2015-06-26 (×4): 1 mg via INTRAVENOUS

## 2015-06-26 MED ORDER — MIDAZOLAM HCL 5 MG/5ML IJ SOLN
INTRAMUSCULAR | Status: AC
  Filled 2015-06-26: qty 25

## 2015-06-26 MED ORDER — SODIUM CHLORIDE 0.9 % IV SOLN
INTRAVENOUS | Status: DC | PRN
  Administered 2015-06-26: 50 mL/h via INTRAVENOUS

## 2015-06-26 MED ORDER — SODIUM CHLORIDE 0.9 % IV SOLN
INTRAVENOUS | Status: DC
  Administered 2015-06-26: 12:00:00 via INTRAVENOUS

## 2015-06-26 MED ORDER — SODIUM CHLORIDE 0.9 % IR SOLN
Status: AC
  Filled 2015-06-26: qty 2

## 2015-06-26 MED ORDER — LIDOCAINE HCL (PF) 1 % IJ SOLN: INTRAMUSCULAR | Status: DC | PRN

## 2015-06-26 MED ORDER — CEFAZOLIN SODIUM-DEXTROSE 2-3 GM-% IV SOLR
INTRAVENOUS | Status: AC
  Filled 2015-06-26: qty 50

## 2015-06-26 MED ORDER — HEPARIN (PORCINE) IN NACL 2-0.9 UNIT/ML-% IJ SOLN
INTRAMUSCULAR | Status: AC
  Filled 2015-06-26: qty 500

## 2015-06-26 MED ORDER — FENTANYL CITRATE (PF) 100 MCG/2ML IJ SOLN
INTRAMUSCULAR | Status: AC
  Filled 2015-06-26: qty 4

## 2015-06-26 MED ORDER — COLCHICINE 0.6 MG PO TABS
0.6000 mg | ORAL_TABLET | Freq: Two times a day (BID) | ORAL | Status: DC
  Administered 2015-06-26 – 2015-06-27 (×3): 0.6 mg via ORAL
  Filled 2015-06-26 (×3): qty 1

## 2015-06-26 MED ORDER — FENTANYL CITRATE (PF) 100 MCG/2ML IJ SOLN
INTRAMUSCULAR | Status: DC | PRN
  Administered 2015-06-26 (×2): 12.5 ug via INTRAVENOUS
  Administered 2015-06-26: 25 ug via INTRAVENOUS

## 2015-06-26 MED ORDER — ONDANSETRON HCL 4 MG/2ML IJ SOLN: 4.0000 mg | Freq: Four times a day (QID) | INTRAMUSCULAR | Status: DC | PRN

## 2015-06-26 MED ORDER — CEFAZOLIN SODIUM 1-5 GM-% IV SOLN
1.0000 g | Freq: Four times a day (QID) | INTRAVENOUS | Status: AC
  Administered 2015-06-26 – 2015-06-27 (×3): 1 g via INTRAVENOUS
  Filled 2015-06-26 (×4): qty 50

## 2015-06-26 MED ORDER — CEFAZOLIN SODIUM-DEXTROSE 2-3 GM-% IV SOLR
2.0000 g | INTRAVENOUS | Status: AC
  Administered 2015-06-26: 2 g via INTRAVENOUS

## 2015-06-26 SURGICAL SUPPLY — 3 items
CABLE SURGICAL S-101-97-12 (CABLE) ×2 IMPLANT
PAD DEFIB LIFELINK (PAD) ×1 IMPLANT
TRAY PACEMAKER INSERTION (PACKS) ×1 IMPLANT

## 2015-06-26 NOTE — Progress Notes (Signed)
SUBJECTIVE: The patient is feeling better this morning but c/w chest discomfort, feels better sitting up.  At this time, he denies shortness of breath, or any new concerns.  . colchicine  0.6 mg Oral BID  . pravastatin  10 mg Oral QHS  . tamsulosin  0.4 mg Oral Once per day on Mon Wed Fri      OBJECTIVE: Physical Exam: Filed Vitals:   06/25/15 2332 06/26/15 0411 06/26/15 0744 06/26/15 0758  BP: 114/51 126/52  102/46  Pulse: 72 65  64  Temp: 98.5 F (36.9 C) 98.3 F (36.8 C)  99.1 F (37.3 C)  TempSrc: Oral Oral  Oral  Resp: 20 18  20   Height:   5\' 9"  (1.753 m)   Weight:  212 lb 4.8 oz (96.299 kg)    SpO2: 93% 94%  92%    Intake/Output Summary (Last 24 hours) at 06/26/15 0949 Last data filed at 06/26/15 0850  Gross per 24 hour  Intake      0 ml  Output    675 ml  Net   -675 ml    Telemetry reveals SR with V pacing GEN- The patient is well appearing, alert and oriented x 3 today.   Head- normocephalic, atraumatic Eyes-  Sclera clear, conjunctiva pink Ears- hearing intact Oropharynx- clear Neck- supple, no JVP Lungs- Clear to ausculation bilaterally, normal work of breathing Heart- Regular rate and rhythm, no significant murmurs, no rubs or gallops GI- soft, NT, ND, + BS Extremities- no clubbing, cyanosis, or edema Skin- no rash or lesion Psych- euthymic mood, full affect Neuro- no gross deficits appreciated, hard of hearing  LABS: Basic Metabolic Panel:  Recent Labs  06/25/15 1636 06/26/15 0416  NA 136 135  K 4.7 4.2  CL 106 104  CO2 23 22  GLUCOSE 190* 142*  BUN 25* 24*  CREATININE 1.35* 1.10  CALCIUM 9.3 9.3   Liver Function Tests:  Recent Labs  06/23/15 1723  AST 17  ALT 14*  ALKPHOS 62  BILITOT 0.6  PROT 6.4*  ALBUMIN 3.5   CBC:  Recent Labs  06/25/15 1636 06/26/15 0416  WBC 11.5* 11.5*  NEUTROABS 10.3*  --   HGB 13.3 12.8*  HCT 40.8 39.2  MCV 90.1 89.9  PLT 167 152   Cardiac Enzymes:  Recent Labs  06/25/15 1636    TROPONINI 0.03   Thyroid Function Tests:  Recent Labs  06/23/15 1723  TSH 1.704   EKG is SR with V pacing 06/25/15: Echocardiogram Study Conclusions - Left ventricle: The cavity size was normal. Wall thickness was increased in a pattern of mild LVH. Systolic function was normal. The estimated ejection fraction was in the range of 60% to 65%. Wall motion was normal; there were no regional wall motion abnormalities. Doppler parameters are consistent with abnormal left ventricular relaxation (grade 1 diastolic dysfunction). - Aortic valve: A bioprosthesis was present. - Left atrium: The atrium was moderately dilated. - Pericardium, extracardiac: A small, loculated pericardial effusion was identified anterior to the heart, along the left ventricular free wall, and along the right ventricular free wall. The fluid had no internal echoes.There was no evidence of hemodynamic compromise.  RADIOLOGY: Dg Chest 2 View 06/25/2015  CLINICAL DATA:  Status post permanent pacemaker placement EXAM: CHEST  2 VIEW COMPARISON:  Chest x-ray of June 23, 2015 FINDINGS: The patient has undergone permanent pacemaker placement. Radiographic positioning of the electrodes and of the generator is good. The lungs are adequately inflated. There  is no pneumothorax or pleural effusion. The cardiac silhouette remains enlarged. The prosthetic aortic valve cage is visible. The pulmonary vascularity is mildly prominent centrally but stable. The bony thorax exhibits no acute abnormality. IMPRESSION: There is no postprocedure complication following permanent pacemaker placement. There is stable enlargement of the cardiac silhouette without significant pulmonary vascular congestion. Electronically Signed   By: David  Martinique M.D.   On: 06/25/2015 07:23     Dg Chest Portable 1 View 06/25/2015  CLINICAL DATA:  Center CP, SOB. Pt had a pacemaker placed yesterday and was released from the hospital this AM.  Shortly after discharge, pt began experiencing the chest pain and was advised to return to the ED. Hx of HTN, stroke, s/p transcatheter aortic valve replacement. EXAM: PORTABLE CHEST 1 VIEW COMPARISON:  06/25/2015 FINDINGS: Cardiac silhouette is mildly enlarged. Changes cardiac surgery post stable. No mediastinal or hilar masses or evidence of adenopathy. Left anterior chest wall sequential pacemaker is stable in well positioned on this single view. Mild linear lung base subsegmental atelectasis. No pneumonia or edema. No pleural effusion or pneumothorax. Bony thorax is demineralized. There is a partly imaged well positioned right shoulder prosthesis. IMPRESSION: 1. No acute cardiopulmonary disease. Electronically Signed   By: Lajean Manes M.D.   On: 06/25/2015 19:43    ASSESSMENT AND PLAN:  Active Problems: 1. Chest pain   Pleuritic chest pain, worse with inspiration and supine, better when sitting up more   s/p PPM implant 06/24/15 for symptomatic bradycardia and CHB   Echo shows new small pericardial effusion without hemodynamic compromise   Device interrogation is stable   Suspect lead perforation   VSS   Plan for lead revision today with Dr. Curt Bears  2. HTN   BP is stable, off his metoprolol  Tommye Standard, PA-C 06/26/2015 9:49 AM  .I have seen and examined this patient with Tommye Standard.  Agree with above, note added to reflect my findings.  On exam, regular rhythm, no murmurs, lungs clear, pain with laying flat and deep breathing.  Patient had pacemaker placed 2 days ago and has since had chest pain with deep breathing and lying flat. Also had a small pericardial effusion on echo. We'll plan today on lead revision. I discussed the risks and benefits with the patient. Risks include bleeding infection and tamponade among others.     Envy Meno M. Amanat Hackel MD 06/26/2015 11:08 AM

## 2015-06-26 NOTE — Progress Notes (Signed)
UR Completed [place] Guiles Graves-Bigelow, RN,BSN [phone]  

## 2015-06-27 ENCOUNTER — Encounter (HOSPITAL_COMMUNITY): Payer: Self-pay | Admitting: Cardiology

## 2015-06-27 ENCOUNTER — Inpatient Hospital Stay (HOSPITAL_COMMUNITY): Payer: Medicare Other

## 2015-06-27 DIAGNOSIS — I4891 Unspecified atrial fibrillation: Secondary | ICD-10-CM

## 2015-06-27 MED ORDER — COLCHICINE 0.6 MG PO TABS
0.6000 mg | ORAL_TABLET | Freq: Every day | ORAL | Status: DC
  Administered 2015-06-28: 0.6 mg via ORAL
  Filled 2015-06-27: qty 1

## 2015-06-27 MED ORDER — PERFLUTREN LIPID MICROSPHERE
1.0000 mL | INTRAVENOUS | Status: AC | PRN
  Administered 2015-06-27: 4 mL via INTRAVENOUS
  Filled 2015-06-27: qty 10

## 2015-06-27 MED ORDER — CLOPIDOGREL BISULFATE 75 MG PO TABS
75.0000 mg | ORAL_TABLET | Freq: Every day | ORAL | Status: DC
  Administered 2015-06-27 – 2015-06-28 (×2): 75 mg via ORAL
  Filled 2015-06-27 (×2): qty 1

## 2015-06-27 MED FILL — Heparin Sodium (Porcine) 2 Unit/ML in Sodium Chloride 0.9%: INTRAMUSCULAR | Qty: 500 | Status: AC

## 2015-06-27 NOTE — Care Management Important Message (Signed)
Important Message  Patient Details  Name: Robert Anderson MRN: [id number] Date of Birth: 1924-04-13   Medicare Important Message Given:  Yes    [name] [name] 06/27/2015, 2:26 PM

## 2015-06-27 NOTE — Progress Notes (Signed)
  Echocardiogram 2D Echocardiogram has been performed.  Robert Anderson 06/27/2015, 11:58 AM

## 2015-06-27 NOTE — Progress Notes (Signed)
SUBJECTIVE: The patient is doing well today. He reports he is feeling much better and denies any ongoing CP.   At this time, he denies shortness of breath, or any new concerns.  No palpitations.  . colchicine  0.6 mg Oral BID  . pravastatin  10 mg Oral QHS  . tamsulosin  0.4 mg Oral Once per day on Mon Wed Fri      OBJECTIVE: Physical Exam: Filed Vitals:   06/27/15 0410 06/27/15 0642 06/27/15 0700 06/27/15 1144  BP: 127/57  116/54 109/57  Pulse: 69  70 66  Temp:   97.4 F (36.3 C) 97.7 F (36.5 C)  TempSrc:   Oral Oral  Resp: 20  20 18   Height:      Weight:  209 lb 14.4 oz (95.21 kg)    SpO2: 95%  97% 95%    Intake/Output Summary (Last 24 hours) at 06/27/15 1155 Last data filed at 06/27/15 0641  Gross per 24 hour  Intake 511.84 ml  Output    850 ml  Net -338.16 ml    Telemetry reveals Afib, with V pacing   GEN- The patient is elderly, well appearing, alert and oriented x 3 today.   Head- normocephalic, atraumatic Eyes-  Sclera clear, conjunctiva pink Ears- hearing intact Oropharynx- clear Neck- supple, no JVP Lungs- Clear to ausculation bilaterally, normal work of breathing Heart- Regular rate and rhythm, no significant murmurs, no rubs or gallops GI- soft, NT, ND, + BS Extremities- no clubbing, cyanosis, or edema Skin- no rash or lesion Psych- euthymic mood, full affect Neuro- no gross deficits appreciated  Pacer site dry, no bleeding, mild ecchymosis, stable  LABS: Basic Metabolic Panel:  Recent Labs  06/25/15 1636 06/26/15 0416  NA 136 135  K 4.7 4.2  CL 106 104  CO2 23 22  GLUCOSE 190* 142*  BUN 25* 24*  CREATININE 1.35* 1.10  CALCIUM 9.3 9.3   CBC:  Recent Labs  06/25/15 1636 06/26/15 0416  WBC 11.5* 11.5*  NEUTROABS 10.3*  --   HGB 13.3 12.8*  HCT 40.8 39.2  MCV 90.1 89.9  PLT 167 152   Cardiac Enzymes:  Recent Labs  06/25/15 1636  TROPONINI 0.03   EKG appears Aflutter, V paced  RADIOLOGY: Dg Chest 2  View 06/27/2015  CLINICAL DATA:  Short of breath.  Pacemaker placement EXAM: CHEST  2 VIEW COMPARISON:  06/25/2015 FINDINGS: Left subclavian dual lead pacemaker in satisfactory position and alignment. T AVR noted Cardiac enlargement without heart failure. Bibasilar atelectasis unchanged. Negative for pulmonary edema. IMPRESSION: Bibasilar atelectasis.  No change from the prior study. Electronically Signed   By: Franchot Gallo M.D.   On: 06/27/2015 07:19   Dg Chest 2 View 06/25/2015  CLINICAL DATA:  Status post permanent pacemaker placement EXAM: CHEST  2 VIEW COMPARISON:  Chest x-ray of June 23, 2015 FINDINGS: The patient has undergone permanent pacemaker placement. Radiographic positioning of the electrodes and of the generator is good. The lungs are adequately inflated. There is no pneumothorax or pleural effusion. The cardiac silhouette remains enlarged. The prosthetic aortic valve cage is visible. The pulmonary vascularity is mildly prominent centrally but stable. The bony thorax exhibits no acute abnormality. IMPRESSION: There is no postprocedure complication following permanent pacemaker placement. There is stable enlargement of the cardiac silhouette without significant pulmonary vascular congestion. Electronically Signed   By: David  Martinique M.D.   On: 06/25/2015 07:23   F/u Echo is pending  ASSESSMENT AND PLAN:  Active  Problems: 1. Pleuritic CP, new small pericardial effusion s/p PPM implant     Pain is resolved now s/p PPM leads revised yesterday     PPM interrogation this morning notes AFib, normal function     BP is stable  2. He developed PAFib this morning at 6:30AM     Likely secondary to pericarditis     We Sheala Dosh check another echo today, if pericardial effusion stable Jaydalyn Demattia resume his Plavix and monitor him overnight   3. 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 HTN 5. Hx of TIA/CVA   Tommye Standard, PA-C 06/27/2015 11:55 AM  I have seen and examined this patient with Tommye Standard.  Agree with  above, note added to reflect my findings.  On exam, irregular rhythm, lungs clear, no murmurs.  Had lead revison yesterday for mircoperforation.  Lead parameters stable, CXR unchanged.  Went into AF overnight.  Could be due to pericarditis.  Due to his effusion, Najeeb Uptain not anticoagulate.  Kent Braunschweig see him back at his 10 day appointment and make decisions on anticoagulation at that time.  No further chest pain.    Tenna Lacko M. Kendale Rembold MD 06/28/2015 10:17 AM

## 2015-06-28 ENCOUNTER — Encounter (HOSPITAL_COMMUNITY): Payer: Self-pay | Admitting: Physician Assistant

## 2015-06-28 DIAGNOSIS — I313 Pericardial effusion (noninflammatory): Secondary | ICD-10-CM

## 2015-06-28 DIAGNOSIS — I3139 Other pericardial effusion (noninflammatory): Secondary | ICD-10-CM

## 2015-06-28 DIAGNOSIS — Z950 Presence of cardiac pacemaker: Secondary | ICD-10-CM

## 2015-06-28 DIAGNOSIS — I309 Acute pericarditis, unspecified: Secondary | ICD-10-CM

## 2015-06-28 MED ORDER — ACETAMINOPHEN 325 MG PO TABS: 325.0000 mg | ORAL_TABLET | ORAL | Status: DC | PRN

## 2015-06-28 MED ORDER — COLCHICINE 0.6 MG PO TABS: 0.6000 mg | ORAL_TABLET | Freq: Every day | ORAL | Status: DC

## 2015-06-28 NOTE — Progress Notes (Signed)
Pt given discharge instructions. Discharged instructions gone over with pt and wife. Pt verbalized understanding of discharge instructions. Pt discharged with wife.   Ruben Reason, RN

## 2015-06-28 NOTE — Discharge Instructions (Addendum)
Supplemental Discharge Instructions for  Pacemaker Patients  Activity No heavy lifting or vigorous activity with your left  arm for 6 to 8 weeks.  Do not raise your left  arm above your head for one week.  Gradually raise your affected arm as drawn below.           07/04/15                  07/05/15                     07/06/15                 07/07/15         NO DRIVING for now     ; you may begin driving when cleared by physician. WOUND CARE - Keep the wound area clean and dry.  Do not get this area wet for one week. No showers for one week; you may shower on   07/04/15     . - The tape/steri-strips on your wound will fall off; do not pull them off.  No bandage is needed on the site.  DO  NOT apply any creams, oils, or ointments to the wound area. - If you notice any drainage or discharge from the wound, any swelling or bruising at the site, or you develop a fever > 101? F after you are discharged home, call the office at once.  Special Instructions - You are still able to use cellular telephones; use the ear opposite the side where you have your pacemaker/defibrillator.  Avoid carrying your cellular phone near your device. - When traveling through airports, show security personnel your identification card to avoid being screened in the metal detectors.  Ask the security personnel to use the hand wand. - Avoid arc welding equipment, MRI testing (magnetic resonance imaging), TENS units (transcutaneous nerve stimulators).  Call the office for questions about other devices. - Avoid electrical appliances that are in poor condition or are not properly grounded. - Microwave ovens are safe to be near or to operate.  Additional information for defibrillator patients should your device go off: - If your device goes off ONCE and you feel fine afterward, notify the device clinic nurses. - If your device goes off ONCE and you do not feel well afterward, call 911. - If your device goes off  TWICE, call 911. - If your device goes off THREE times in one day, call 911.  DO NOT DRIVE YOURSELF OR A FAMILY MEMBER WITH A DEFIBRILLATOR TO THE HOSPITAL--CALL 911.   Heart-Healthy Eating Plan Many factors influence your heart health, including eating and exercise habits. Heart (coronary) risk increases with abnormal blood fat (lipid) levels. Heart-healthy meal planning includes limiting unhealthy fats, increasing healthy fats, and making other small dietary changes. This includes maintaining a healthy body weight to help keep lipid levels within a normal range. WHAT IS MY PLAN?  Your health care provider recommends that you:  Get no more than _________% of the total calories in your daily diet from fat.  Limit your intake of saturated fat to less than _________% of your total calories each day.  Limit the amount of cholesterol in your diet to less than _________ mg per day. WHAT TYPES OF FAT SHOULD I CHOOSE?  Choose healthy fats more often. Choose monounsaturated and polyunsaturated fats, such as olive oil and canola oil, flaxseeds, walnuts, almonds, and seeds.  Eat more omega-3 fats. Good choices  include salmon, mackerel, sardines, tuna, flaxseed oil, and ground flaxseeds. Aim to eat fish at least two times each week.  Limit saturated fats. Saturated fats are primarily found in animal products, such as meats, butter, and cream. Plant sources of saturated fats include palm oil, palm kernel oil, and coconut oil.  Avoid foods with partially hydrogenated oils in them. These contain trans fats. Examples of foods that contain trans fats are stick margarine, some tub margarines, cookies, crackers, and other baked goods. WHAT GENERAL GUIDELINES DO I NEED TO FOLLOW?  Check food labels carefully to identify foods with trans fats or high amounts of saturated fat.  Fill one half of your plate with vegetables and green salads. Eat 4-5 servings of vegetables per day. A serving of vegetables equals  1 cup of raw leafy vegetables,  cup of raw or cooked cut-up vegetables, or  cup of vegetable juice.  Fill one fourth of your plate with whole grains. Look for the word "whole" as the first word in the ingredient list.  Fill one fourth of your plate with lean protein foods.  Eat 4-5 servings of fruit per day. A serving of fruit equals one medium whole fruit,  cup of dried fruit,  cup of fresh, frozen, or canned fruit, or  cup of 100% fruit juice.  Eat more foods that contain soluble fiber. Examples of foods that contain this type of fiber are apples, broccoli, carrots, beans, peas, and barley. Aim to get 20-30 g of fiber per day.  Eat more home-cooked food and less restaurant, buffet, and fast food.  Limit or avoid alcohol.  Limit foods that are high in starch and sugar.  Avoid fried foods.  Cook foods by using methods other than frying. Baking, boiling, grilling, and broiling are all great options. Other fat-reducing suggestions include:  Removing the skin from poultry.  Removing all visible fats from meats.  Skimming the fat off of stews, soups, and gravies before serving them.  Steaming vegetables in water or broth.  Lose weight if you are overweight. Losing just 5-10% of your initial body weight can help your overall health and prevent diseases such as diabetes and heart disease.  Increase your consumption of nuts, legumes, and seeds to 4-5 servings per week. One serving of dried beans or legumes equals  cup after being cooked, one serving of nuts equals 1 ounces, and one serving of seeds equals  ounce or 1 tablespoon.  You may need to monitor your salt (sodium) intake, especially if you have high blood pressure. Talk with your health care provider or dietitian to get more information about reducing sodium. WHAT FOODS CAN I EAT? Grains Breads, including [place], white, pita, wheat, raisin, rye, oatmeal, and [place]. Tortillas that are neither fried nor made with lard or  trans fat. Low-fat rolls, including hotdog and hamburger buns and English muffins. Biscuits. Muffins. Waffles. Pancakes. Light popcorn. Whole-grain cereals. Flatbread. Melba toast. Pretzels. Breadsticks. Rusks. Low-fat snacks and crackers, including oyster, saltine, matzo, graham, animal, and rye. Rice and pasta, including brown rice and those that are made with whole wheat. Vegetables All vegetables. Fruits All fruits, but limit coconut. Meats and Other Protein Sources Lean, well-trimmed beef, veal, pork, and lamb. Chicken and [place] without skin. All fish and shellfish. Wild duck, rabbit, pheasant, and venison. Egg whites or low-cholesterol egg substitutes. Dried beans, peas, lentils, and tofu.Seeds and most nuts. Dairy Low-fat or nonfat cheeses, including ricotta, string, and mozzarella. Skim or 1% milk that is liquid, powdered,  or evaporated. Buttermilk that is made with low-fat milk. Nonfat or low-fat yogurt. Beverages Mineral water. Diet carbonated beverages. Sweets and Desserts Sherbets and fruit ices. Honey, jam, marmalade, jelly, and syrups. Meringues and gelatins. Pure sugar candy, such as hard candy, jelly beans, gumdrops, mints, marshmallows, and small amounts of dark chocolate. [employer] Eat all sweets and desserts in moderation. Fats and Oils Nonhydrogenated (trans-free) margarines. Vegetable oils, including soybean, sesame, sunflower, olive, peanut, safflower, corn, canola, and cottonseed. Salad dressings or mayonnaise that are made with a vegetable oil. Limit added fats and oils that you use for cooking, baking, salads, and as spreads. Other Cocoa powder. Coffee and tea. All seasonings and condiments. The items listed above may not be a complete list of recommended foods or beverages. Contact your dietitian for more options. WHAT FOODS ARE NOT RECOMMENDED? Grains Breads that are made with saturated or trans fats, oils, or whole milk. Croissants. Butter rolls. Cheese  breads. Sweet rolls. Donuts. Buttered popcorn. Chow mein noodles. High-fat crackers, such as cheese or butter crackers. Meats and Other Protein Sources Fatty meats, such as hotdogs, short ribs, sausage, spareribs, bacon, ribeye roast or steak, and mutton. High-fat deli meats, such as salami and bologna. Caviar. Domestic duck and goose. Organ meats, such as kidney, liver, sweetbreads, brains, gizzard, chitterlings, and heart. Dairy Cream, sour cream, cream cheese, and creamed cottage cheese. Whole milk cheeses, including blue (bleu), Monte[place], [place], [place], [place], [place], [place], [place], [place], and [place]. Whole or 2% milk that is liquid, evaporated, or condensed. Whole buttermilk. Cream sauce or high-fat cheese sauce. Yogurt that is made from whole milk. Beverages Regular sodas and drinks with added sugar. Sweets and Desserts Frosting. Pudding. Cookies. Cakes other than angel food cake. Candy that has milk chocolate or white chocolate, hydrogenated fat, butter, coconut, or unknown ingredients. Buttered syrups. Full-fat ice cream or ice cream drinks. Fats and Oils Gravy that has suet, meat fat, or shortening. Cocoa butter, hydrogenated oils, palm oil, coconut oil, palm kernel oil. These can often be found in baked products, candy, fried foods, nondairy creamers, and whipped toppings. Solid fats and shortenings, including bacon fat, salt pork, lard, and butter. Nondairy cream substitutes, such as coffee creamers and sour cream substitutes. Salad dressings that are made of unknown oils, cheese, or sour cream. The items listed above may not be a complete list of foods and beverages to avoid. Contact your dietitian for more information.   This information is not intended to replace advice given to you by your health care provider. Make sure you discuss any questions you have with your health care provider.   Document Released: 05/11/2008 Document Revised: 08/23/2014 Document Reviewed:  01/24/2014 Elsevier Interactive Patient Education [employer].

## 2015-06-28 NOTE — Progress Notes (Signed)
SUBJECTIVE: The patient is doing well today. He reports he is feeling much better and denies any ongoing CP.        . clopidogrel  75 mg Oral Daily  . colchicine  0.6 mg Oral Daily  . pravastatin  10 mg Oral QHS  . tamsulosin  0.4 mg Oral Once per day on Mon Wed Fri      OBJECTIVE: Physical Exam: Filed Vitals:   06/27/15 0700 06/27/15 1144 06/27/15 2057 06/28/15 0438  BP: 116/54 109/57 131/60 140/64  Pulse: 70 66 62 66  Temp: 97.4 F (36.3 C) 97.7 F (36.5 C) 97.4 F (36.3 C) 98 F (36.7 C)  TempSrc: Oral Oral Oral Oral  Resp: 20 18 20    Height:      Weight:    208 lb 6.4 oz (94.53 kg)  SpO2: 97% 95% 93% 93%    Intake/Output Summary (Last 24 hours) at 06/28/15 0932 Last data filed at 06/28/15 0737  Gross per 24 hour  Intake    940 ml  Output    980 ml  Net    -40 ml    Telemetry reveals Afib, with V pacing   GEN- The patient is elderly, well appearing, alert and oriented x 3 today.   Head- normocephalic, atraumatic Eyes-  Sclera clear, conjunctiva pink Ears-almost as deaf as a door nail Neck- supple, no JVP Lungs- Clear to ausculation bilaterally, normal work of breathing Heart- Regular rate and rhythm,2/6 murmur  Device pocket without swelling  GI- soft, NT, ND, + BS Extremities- no clubbing, cyanosis, or edema Skin- no rash or lesion Psych- euthymic mood, full affect Neuro- no gross deficits appreciated  Pacer site dry, no bleeding, mild ecchymosis, stable  LABS: Basic Metabolic Panel:  Recent Labs  06/25/15 1636 06/26/15 0416  NA 136 135  K 4.7 4.2  CL 106 104  CO2 23 22  GLUCOSE 190* 142*  BUN 25* 24*  CREATININE 1.35* 1.10  CALCIUM 9.3 9.3   CBC:  Recent Labs  06/25/15 1636 06/26/15 0416  WBC 11.5* 11.5*  NEUTROABS 10.3*  --   HGB 13.3 12.8*  HCT 40.8 39.2  MCV 90.1 89.9  PLT 167 152   Cardiac Enzymes:  Recent Labs  06/25/15 1636  TROPONINI 0.03   EKG appears Aflutter, V paced  RADIOLOGY: Dg Chest 2  View 06/27/2015  CLINICAL DATA:  Short of breath.  Pacemaker placement EXAM: CHEST  2 VIEW COMPARISON:  06/25/2015 FINDINGS: Left subclavian dual lead pacemaker in satisfactory position and alignment. T AVR noted Cardiac enlargement without heart failure. Bibasilar atelectasis unchanged. Negative for pulmonary edema. IMPRESSION: Bibasilar atelectasis.  No change from the prior study. Electronically Signed   By: Franchot Gallo M.D.   On: 06/27/2015 07:19   Dg Chest 2 View 06/25/2015  CLINICAL DATA:  Status post permanent pacemaker placement EXAM: CHEST  2 VIEW COMPARISON:  Chest x-ray of June 23, 2015 FINDINGS: The patient has undergone permanent pacemaker placement. Radiographic positioning of the electrodes and of the generator is good. The lungs are adequately inflated. There is no pneumothorax or pleural effusion. The cardiac silhouette remains enlarged. The prosthetic aortic valve cage is visible. The pulmonary vascularity is mildly prominent centrally but stable. The bony thorax exhibits no acute abnormality. IMPRESSION: There is no postprocedure complication following permanent pacemaker placement. There is stable enlargement of the cardiac silhouette without significant pulmonary vascular congestion. Electronically Signed   By: David  Martinique M.D.   On: 06/25/2015 07:23  F/u Echo is pending  ASSESSMENT AND PLAN:  Active Problems: 1. Pleuritic CP, new small pericardial effusion s/p PPM implant     Pain is resolved now s/p PPM leads revised yesterday     PPM interrogation this morning notes AFib, normal function     BP is stable  2. He developed PAFib this morning at 6:30AM     Likely secondary to pericarditis     We will check another echo today, if pericardial effusion stable will resume his Plavix and monitor him overnight   3. 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 HTN 5. Hx of TIA/CVA   No significant pericardial effusion. We'll plan discharge today resuming his Plavix in the context of his TAVR will  continue colchicine for 3 weeks.

## 2015-06-28 NOTE — Discharge Summary (Signed)
Discharge Summary   Patient ID: Robert Anderson, MRN: [id number], DOB/AGE: 1924/07/12 81 y.o.  Admit date: 06/25/2015 Discharge date: 06/28/2015   Patient Care Team: Dorena Cookey, MD as PCP - General Dorothy Spark, MD as Consulting Physician (Cardiology) Burnell Blanks, MD as Consulting Physician (Cardiology) Evans Lance, MD as Consulting Physician (Clinical Cardiac Electrophysiology)    Reason for Admission:  Chest Pain   Primary Discharge Diagnoses:  Principal Problem:   Pericardial effusion secondary to Pacemaker lead microperforation Active Problems:   Acute pericarditis in setting of pericardial effusion and microperforation   Complete heart block (HCC)   Cardiac pacemaker in situ     Wt Readings from Last 3 Encounters:  06/28/15 208 lb 6.4 oz (94.53 kg)  06/25/15 214 lb 15.2 oz (97.5 kg)  06/23/15 216 lb 12.8 oz (98.34 kg)    Secondary Discharge Diagnoses:   Past Medical History  Diagnosis Date  . Essential hypertension   . ERECTILE DYSFUNCTION   . HEARING LOSS   . TIA (transient ischemic attack) 01/21/2014    5 min spell aphasia without assoc sx    . Severe aortic stenosis     a. s/p TAVR 03/2015. Pre-op  LHC 02/2015: minor nonobstructive CAD.  Marland Kitchen Hypertrophic obstructive cardiomyopathy ([place])   . Stroke ([place]) 02/19/2014    Small infarct high posterior left frontal lobe on MRI   . 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 LBBB (left bundle branch block)   . First degree AV block   . Bradycardia     a. HR 30s in clinic 06/2015 - BB discontinued.  Marland Kitchen Heart block     s/p Pacemaker  . DEGENERATIVE JOINT DISEASE, SPINE   . OA (osteoarthritis)     both hips  . Hyperlipidemia   . Presence of permanent cardiac pacemaker 06/24/2015  . Squamous cell skin cancer     left lower leg  . Prostate cancer [facility]) 2010    "external radiation; 40  treatments"  . Pleural effusion 06/25/2015    "large"/report 06/25/2015  . Pericardial effusion     a. micorpeforation s/p lead revision 06/26/15  . Atrial fibrillation ([place])     a. dx after lead revision 06/26/15 >> anticoag not started due to recent pericardial effusion in setting of perforation and lead revision      Allergies:   No Known Allergies    Procedures Performed This Admission:    Pacemaker Lead Revision 06/26/15 CONCLUSIONS:  1. Successful revision of the right atrial and right ventricular leads of a certainly helps up [place] DR dual-chamber pacemaker for symptomatic bradycardia 2. No early apparent complications.    Hospital Course:  JAYCEON ERDOS is a 81 y.o. male with a hx of severe AS s/p TAVR 8/16, HOCM, LBBB, 1st degree AVB, HTN, prior TIA and CVA.  LHC in 7/16 demonstrated minor non-obstructive CAD.  Echo in 9/16 demonstrated vigorous LVEF, near cavity obliteration, mod LVH, EF 65-70%, Gr 1 DD, mod LAE.   Patient was seen 9/16 for dizziness and beta-blocker was resumed given HOCM.  Patient then had near syncopal episode 06/15/15.  He reduced his beta-blocker from bid to QD. He continued to feel weak and seen in FU in the office with 2:1 heart block and HR 34.  His beta-blocker was DC'd.  When seen in FU, he was noted to  be in complete heart block with HR 40.  He remained symptomatic.  He underwent dual chamber PPM implantation 06/24/15 with Dr. Cristopher Peru.  Prior to DC on 11/9, he was placed back on beta-blocker therapy.    He was readmitted on 11/9 with chest pain with deep breathing and lying flat. Echo demonstrated a small pericardial effusion.  Syndrome was felt to be related to microperforation.  Patient underwent revision of RA and RV lead revision by Dr. Allegra Lai 06/26/15.   Chest symptoms improved after lead revision. Patient did develop AFib yesterday felt to likely be due to pericarditis.  He was not placed on anticoagulation 2/2 pericardial  effusion in setting of microperforation.  Repeat echo demonstrated trivial effusion.  He was placed on colchicine x 3 weeks for treatment of pericarditis.  Plavix was resumed given recent TAVR.  He was evaluated by Dr. Virl Axe this AM and felt to be stable for DC.  He will keep FU with Pacemaker clinic this week.    Discharge Vitals:   Blood pressure 140/64, pulse 66, temperature 98 F (36.7 C), temperature source Oral, resp. rate 20, height 5\' 9"  (1.753 m), weight 208 lb 6.4 oz (94.53 kg), SpO2 93 %.   Labs:   Recent Labs  06/25/15 1636 06/26/15 0416  WBC 11.5* 11.5*  HGB 13.3 12.8*  HCT 40.8 39.2  MCV 90.1 89.9  PLT 167 152     Recent Labs  06/25/15 1636 06/26/15 0416  NA 136 135  K 4.7 4.2  CL 106 104  CO2 23 22  BUN 25* 24*  CREATININE 1.35* 1.10  CALCIUM 9.3 9.3     Recent Labs  06/25/15 1636  TROPONINI 0.03    Lab Results  Component Value Date   TSH 1.704 06/23/2015     Diagnostic Procedures and Studies:  Dg Chest 2 View  06/27/2015 IMPRESSION: Bibasilar atelectasis.  No change from the prior study. Electronically Signed   By: Franchot Gallo M.D.   On: 06/27/2015 07:19   Dg Chest 2 View  06/25/2015 IMPRESSION: There is no postprocedure complication following permanent pacemaker placement. There is stable enlargement of the cardiac silhouette without significant pulmonary vascular congestion. Electronically Signed   By: David  Martinique M.D.   On: 06/25/2015 07:23   X-ray Chest Pa And Lateral  06/23/2015 IMPRESSION: Fairly marked cardiomegaly, stable. Central pulmonary vascular congestion is unchanged, probably a chronic mild congestive heart failure. No frank pulmonary edema pattern. Lungs otherwise clear. Electronically Signed   By: Franki Cabot M.D.   On: 06/23/2015 18:34   Dg Chest Portable 1 View  06/25/2015 IMPRESSION: 1. No acute cardiopulmonary disease. Electronically Signed   By: Lajean Manes M.D.   On: 06/25/2015 19:43     2D  Echocardiogram 06/25/15 - Left ventricle: The cavity size was normal. Wall thickness was increased in a pattern of mild LVH. Systolic function was normal. The estimated ejection fraction was in the range of 60% to 65%. Wall motion was normal; there were no regional wall motion abnormalities. Doppler parameters are consistent with abnormal left ventricular relaxation (grade 1 diastolic dysfunction). - Aortic valve: A bioprosthesis was present. - Left atrium: The atrium was moderately dilated. - Pericardium, extracardiac: A small, loculated pericardial effusion was identified anterior to the heart, along the left ventricular free wall, and along the right ventricular free wall. The fluid had no internal echoes.There was no evidence of hemodynamic compromise.  2D Echocardiogram 06/27/15 - Left ventricle: The cavity size was  normal. Wall thickness was increased in a pattern of moderate LVH. Systolic function was vigorous. The estimated ejection fraction was in the range of 65% to 70%. Wall motion was normal; there were no regional wall motion abnormalities. Doppler parameters are consistent with abnormal left ventricular relaxation (grade 1 diastolic dysfunction). - Mitral valve: Mildly calcified annulus. - Left atrium: The atrium was mildly dilated. - Pericardium, extracardiac: A trivial pericardial effusion was identified.   Disposition:   Pt is being discharged home today in good condition.   Follow-up Plans & Appointments      Follow-up Information    Follow up with [facility] On 07/03/2015.   Specialty:  Cardiology   Why:  Wound Check with the Pacemaker Clinic at 11:30 AM   Contact information:   [address], [place] [phone]      Discharge Medications    Medication List    STOP taking these medications        ibuprofen 600 MG tablet  Commonly known as:   ADVIL,MOTRIN      TAKE these medications        acetaminophen 325 MG tablet  Commonly known as:  TYLENOL  Take 1-2 tablets (325-650 mg total) by mouth every 4 (four) hours as needed for mild pain.     clopidogrel 75 MG tablet  Commonly known as:  PLAVIX  TAKE 1 TABLET (75 MG TOTAL) BY MOUTH DAILY.     colchicine 0.6 MG tablet  Take 1 tablet (0.6 mg total) by mouth daily. For a total of 3 weeks, then stop     docusate sodium 100 MG capsule  Commonly known as:  COLACE  Take 200 mg by mouth daily.     metoprolol tartrate 25 MG tablet  Commonly known as:  LOPRESSOR  Take 0.5 tablets (12.5 mg total) by mouth 2 (two) times daily.     pravastatin 10 MG tablet  Commonly known as:  PRAVACHOL  Take 1 tablet (10 mg total) by mouth at bedtime.     tamsulosin 0.4 MG Caps capsule  Commonly known as:  FLOMAX  Take 1 capsule (0.4 mg total) by mouth 3 (three) times a week. Monday Wednesday Fridday         Outstanding Labs/Studies  1. None    Duration of Discharge Encounter: Greater than 30 minutes including physician and PA time.  Signed, Richardson Dopp, PA-C   06/28/2015 11:10 AM

## 2015-06-30 ENCOUNTER — Ambulatory Visit: Payer: Medicare Other | Admitting: Family Medicine

## 2015-07-03 ENCOUNTER — Encounter: Payer: Self-pay | Admitting: Internal Medicine

## 2015-07-03 ENCOUNTER — Ambulatory Visit (INDEPENDENT_AMBULATORY_CARE_PROVIDER_SITE_OTHER): Payer: Medicare Other | Admitting: *Deleted

## 2015-07-03 DIAGNOSIS — I442 Atrioventricular block, complete: Secondary | ICD-10-CM | POA: Diagnosis not present

## 2015-07-03 DIAGNOSIS — Z950 Presence of cardiac pacemaker: Secondary | ICD-10-CM

## 2015-07-03 LAB — CUP PACEART INCLINIC DEVICE CHECK
Battery Remaining Longevity: 82.8
Battery Voltage: 3.05 V
Implantable Lead Implant Date: 20161108
Implantable Lead Location: 753859
Lead Channel Impedance Value: 437.5 Ohm
Lead Channel Pacing Threshold Amplitude: 0.5 V
Lead Channel Pacing Threshold Amplitude: 1 V
Lead Channel Pacing Threshold Amplitude: 1 V
Lead Channel Pacing Threshold Pulse Width: 0.5 ms
Lead Channel Pacing Threshold Pulse Width: 0.5 ms
Lead Channel Sensing Intrinsic Amplitude: 12 mV
Lead Channel Setting Pacing Amplitude: 0.75 V
Lead Channel Setting Pacing Amplitude: 3.5 V
Lead Channel Setting Sensing Sensitivity: 5 mV
MDC IDC LEAD IMPLANT DT: 20161108
MDC IDC LEAD LOCATION: 753860
MDC IDC MSMT LEADCHNL RA PACING THRESHOLD PULSEWIDTH: 0.5 ms
MDC IDC MSMT LEADCHNL RA SENSING INTR AMPL: 3.6 mV
MDC IDC MSMT LEADCHNL RV IMPEDANCE VALUE: 562.5 Ohm
MDC IDC MSMT LEADCHNL RV PACING THRESHOLD AMPLITUDE: 0.5 V
MDC IDC MSMT LEADCHNL RV PACING THRESHOLD PULSEWIDTH: 0.5 ms
MDC IDC PG SERIAL: 7825693
MDC IDC SESS DTM: 20161117122906
MDC IDC SET LEADCHNL RV PACING PULSEWIDTH: 0.5 ms
MDC IDC STAT BRADY RA PERCENT PACED: 71 %
MDC IDC STAT BRADY RV PERCENT PACED: 99.72 %

## 2015-07-03 NOTE — Progress Notes (Signed)
Wound check appointment. Steri-strips removed. Wound without redness or edema. Incision edges approximated, wound well healed. Normal device function. Thresholds, sensing, and impedances consistent with implant measurements. Device programmed at 3.5V/auto capture programmed on for extra safety margin until 3 month visit. Histogram distribution appropriate for patient and level of activity. 10 mode switches- AF (longest 40mins) all occurred 06/27/15, no OAC. No high ventricular rates noted. Patient educated about wound care, arm mobility, lifting restrictions. ROV with GT 09/25/15.

## 2015-07-04 ENCOUNTER — Telehealth: Payer: Self-pay | Admitting: Physician Assistant

## 2015-07-04 NOTE — Telephone Encounter (Signed)
Dr Lovena Le looked at it today and says on;ly the one episode lasting 56 mins. He was NSR yesterday and has had no more episodes since. Will hold off on on anticoag for now per Dr Lovena Le until effusion size has resolved.  Robert Anderson       Previous Messages     ----- Message -----   From: Liliane Shi, PA-C   Sent: 07/03/2015  7:43 PM    To: Thompson Grayer, MD, Liliane Shi, PA-C, *   Was this patient's anticoagulation recommendation reviewed with EP today?  Richardson Dopp, PA-C   07/03/2015 7:43 PM   ----- Message -----   From: Liliane Shi, PA-C   Sent: 06/28/2015 10:36 AM    To: Thompson Grayer, MD, Liliane Shi, PA-C, *   This patient is being DC 06/28/2015 after readmit for microperforation and lead revision.  He went into AF but not put on anticoag due to pericardial effusion in setting of perforation  He has wound check 07/03/15.  He needs to have his case reviewed by EP prior to leaving office on 11/17.  Looks like Dr. Thompson Grayer in the office that day.  Richardson Dopp, PA-C   06/28/2015 10:38 AM

## 2015-07-06 ENCOUNTER — Emergency Department (HOSPITAL_COMMUNITY): Payer: Medicare Other

## 2015-07-06 ENCOUNTER — Emergency Department (HOSPITAL_BASED_OUTPATIENT_CLINIC_OR_DEPARTMENT_OTHER): Payer: Medicare Other

## 2015-07-06 ENCOUNTER — Observation Stay (HOSPITAL_COMMUNITY)
Admission: EM | Admit: 2015-07-06 | Discharge: 2015-07-07 | Disposition: A | Discharge status: Home / Self Care | Payer: Medicare Other | Attending: Cardiology | Admitting: Cardiology

## 2015-07-06 ENCOUNTER — Encounter (HOSPITAL_COMMUNITY): Payer: Self-pay | Admitting: Emergency Medicine

## 2015-07-06 DIAGNOSIS — Z8673 Personal history of transient ischemic attack (TIA), and cerebral infarction without residual deficits: Secondary | ICD-10-CM | POA: Diagnosis not present

## 2015-07-06 DIAGNOSIS — Z8546 Personal history of malignant neoplasm of prostate: Secondary | ICD-10-CM | POA: Diagnosis not present

## 2015-07-06 DIAGNOSIS — Z950 Presence of cardiac pacemaker: Secondary | ICD-10-CM | POA: Diagnosis present

## 2015-07-06 DIAGNOSIS — Z79899 Other long term (current) drug therapy: Secondary | ICD-10-CM | POA: Diagnosis not present

## 2015-07-06 DIAGNOSIS — E785 Hyperlipidemia, unspecified: Secondary | ICD-10-CM | POA: Insufficient documentation

## 2015-07-06 DIAGNOSIS — M160 Bilateral primary osteoarthritis of hip: Secondary | ICD-10-CM | POA: Insufficient documentation

## 2015-07-06 DIAGNOSIS — R079 Chest pain, unspecified: Secondary | ICD-10-CM

## 2015-07-06 DIAGNOSIS — R0602 Shortness of breath: Secondary | ICD-10-CM | POA: Diagnosis not present

## 2015-07-06 DIAGNOSIS — J9811 Atelectasis: Secondary | ICD-10-CM | POA: Diagnosis not present

## 2015-07-06 DIAGNOSIS — I10 Essential (primary) hypertension: Secondary | ICD-10-CM | POA: Diagnosis not present

## 2015-07-06 DIAGNOSIS — I3139 Other pericardial effusion (noninflammatory): Secondary | ICD-10-CM | POA: Diagnosis present

## 2015-07-06 DIAGNOSIS — R072 Precordial pain: Secondary | ICD-10-CM | POA: Diagnosis not present

## 2015-07-06 DIAGNOSIS — I313 Pericardial effusion (noninflammatory): Secondary | ICD-10-CM | POA: Diagnosis present

## 2015-07-06 DIAGNOSIS — Z7902 Long term (current) use of antithrombotics/antiplatelets: Secondary | ICD-10-CM | POA: Diagnosis not present

## 2015-07-06 DIAGNOSIS — I421 Obstructive hypertrophic cardiomyopathy: Secondary | ICD-10-CM | POA: Diagnosis not present

## 2015-07-06 DIAGNOSIS — I309 Acute pericarditis, unspecified: Secondary | ICD-10-CM | POA: Diagnosis not present

## 2015-07-06 DIAGNOSIS — I4891 Unspecified atrial fibrillation: Secondary | ICD-10-CM | POA: Insufficient documentation

## 2015-07-06 DIAGNOSIS — R109 Unspecified abdominal pain: Secondary | ICD-10-CM | POA: Diagnosis not present

## 2015-07-06 DIAGNOSIS — Z953 Presence of xenogenic heart valve: Secondary | ICD-10-CM | POA: Diagnosis not present

## 2015-07-06 LAB — BASIC METABOLIC PANEL
Anion gap: 7 (ref 5–15)
BUN: 18 mg/dL (ref 6–20)
CALCIUM: 9.7 mg/dL (ref 8.9–10.3)
CO2: 25 mmol/L (ref 22–32)
CREATININE: 1.04 mg/dL (ref 0.61–1.24)
Chloride: 106 mmol/L (ref 101–111)
GFR calc Af Amer: >60 mL/min (ref >60)
GLUCOSE: 142 mg/dL — AB (ref 65–99)
POTASSIUM: 4.4 mmol/L (ref 3.5–5.1)
SODIUM: 138 mmol/L (ref 135–145)

## 2015-07-06 LAB — HEPATIC FUNCTION PANEL
ALBUMIN: 3.6 g/dL (ref 3.5–5.0)
ALK PHOS: 67 U/L (ref 38–126)
ALT: 18 U/L (ref 17–63)
AST: 18 U/L (ref 15–41)
BILIRUBIN DIRECT: 0.2 mg/dL (ref 0.1–0.5)
BILIRUBIN TOTAL: 0.7 mg/dL (ref 0.3–1.2)
Indirect Bilirubin: 0.5 mg/dL (ref 0.3–0.9)
Total Protein: 6.7 g/dL (ref 6.5–8.1)

## 2015-07-06 LAB — CBC
HCT: 45.4 % (ref 39.0–52.0)
Hemoglobin: 14.8 g/dL (ref 13.0–17.0)
MCH: 29.4 pg (ref 26.0–34.0)
MCHC: 32.6 g/dL (ref 30.0–36.0)
MCV: 90.1 fL (ref 78.0–100.0)
PLATELETS: 238 10*3/uL (ref 150–400)
RBC: 5.04 MIL/uL (ref 4.22–5.81)
RDW: 13.1 % (ref 11.5–15.5)
WBC: 10.9 10*3/uL — AB (ref 4.0–10.5)

## 2015-07-06 LAB — I-STAT CHEM 8, ED
BUN: 21 mg/dL — ABNORMAL HIGH (ref 6–20)
CALCIUM ION: 1.24 mmol/L (ref 1.13–1.30)
CHLORIDE: 102 mmol/L (ref 101–111)
Creatinine, Ser: 0.9 mg/dL (ref 0.61–1.24)
GLUCOSE: 138 mg/dL — AB (ref 65–99)
HCT: 49 % (ref 39.0–52.0)
Hemoglobin: 16.7 g/dL (ref 13.0–17.0)
Potassium: 4.3 mmol/L (ref 3.5–5.1)
SODIUM: 140 mmol/L (ref 135–145)
TCO2: 25 mmol/L (ref 0–100)

## 2015-07-06 LAB — I-STAT TROPONIN, ED
TROPONIN I, POC: 0.02 ng/mL (ref 0.00–0.08)
Troponin i, poc: 0.03 ng/mL (ref 0.00–0.08)

## 2015-07-06 LAB — LIPASE, BLOOD: Lipase: 31 U/L (ref 11–51)

## 2015-07-06 MED ORDER — METOPROLOL TARTRATE 12.5 MG HALF TABLET
12.5000 mg | ORAL_TABLET | Freq: Two times a day (BID) | ORAL | Status: DC
  Administered 2015-07-06 – 2015-07-07 (×3): 12.5 mg via ORAL
  Filled 2015-07-06 (×3): qty 1

## 2015-07-06 MED ORDER — ENSURE ENLIVE PO LIQD
237.0000 mL | Freq: Two times a day (BID) | ORAL | Status: DC
  Administered 2015-07-06 – 2015-07-07 (×2): 237 mL via ORAL

## 2015-07-06 MED ORDER — ZOLPIDEM TARTRATE 5 MG PO TABS: 5.0000 mg | ORAL_TABLET | Freq: Every evening | ORAL | Status: DC | PRN

## 2015-07-06 MED ORDER — ACETAMINOPHEN 325 MG PO TABS: 325.0000 mg | ORAL_TABLET | ORAL | Status: DC | PRN

## 2015-07-06 MED ORDER — DOCUSATE SODIUM 100 MG PO CAPS
200.0000 mg | ORAL_CAPSULE | Freq: Every day | ORAL | Status: DC
  Administered 2015-07-06 – 2015-07-07 (×2): 200 mg via ORAL
  Filled 2015-07-06 (×2): qty 2

## 2015-07-06 MED ORDER — SODIUM CHLORIDE 0.9 % IV SOLN
INTRAVENOUS | Status: DC
  Administered 2015-07-06 (×2): via INTRAVENOUS

## 2015-07-06 MED ORDER — ALPRAZOLAM 0.25 MG PO TABS: 0.2500 mg | ORAL_TABLET | Freq: Two times a day (BID) | ORAL | Status: DC | PRN

## 2015-07-06 MED ORDER — IBUPROFEN 600 MG PO TABS
600.0000 mg | ORAL_TABLET | Freq: Three times a day (TID) | ORAL | Status: DC
  Administered 2015-07-06 – 2015-07-07 (×4): 600 mg via ORAL
  Filled 2015-07-06 (×4): qty 1

## 2015-07-06 MED ORDER — ONDANSETRON HCL 4 MG/2ML IJ SOLN
4.0000 mg | Freq: Once | INTRAMUSCULAR | Status: AC
  Administered 2015-07-06: 4 mg via INTRAVENOUS
  Filled 2015-07-06: qty 2

## 2015-07-06 MED ORDER — SODIUM CHLORIDE 0.9 % IV SOLN: 250.0000 mL | INTRAVENOUS | Status: DC | PRN

## 2015-07-06 MED ORDER — FENTANYL CITRATE (PF) 100 MCG/2ML IJ SOLN: 50.0000 ug | INTRAMUSCULAR | Status: DC | PRN

## 2015-07-06 MED ORDER — HYDROCODONE-ACETAMINOPHEN 5-325 MG PO TABS
1.0000 | ORAL_TABLET | ORAL | Status: DC | PRN
  Administered 2015-07-06: 1 via ORAL
  Filled 2015-07-06: qty 1

## 2015-07-06 MED ORDER — IOHEXOL 350 MG/ML SOLN
100.0000 mL | Freq: Once | INTRAVENOUS | Status: AC | PRN
  Administered 2015-07-06: 100 mL via INTRAVENOUS

## 2015-07-06 MED ORDER — ENOXAPARIN SODIUM 40 MG/0.4ML ~~LOC~~ SOLN
40.0000 mg | SUBCUTANEOUS | Status: DC
  Administered 2015-07-06: 40 mg via SUBCUTANEOUS
  Filled 2015-07-06: qty 0.4

## 2015-07-06 MED ORDER — CLOPIDOGREL BISULFATE 75 MG PO TABS
75.0000 mg | ORAL_TABLET | Freq: Every day | ORAL | Status: DC
  Administered 2015-07-06 – 2015-07-07 (×2): 75 mg via ORAL
  Filled 2015-07-06 (×2): qty 1

## 2015-07-06 MED ORDER — PRAVASTATIN SODIUM 10 MG PO TABS
10.0000 mg | ORAL_TABLET | Freq: Every day | ORAL | Status: DC
  Administered 2015-07-06: 10 mg via ORAL
  Filled 2015-07-06 (×2): qty 1

## 2015-07-06 MED ORDER — MORPHINE SULFATE (PF) 4 MG/ML IV SOLN
4.0000 mg | Freq: Once | INTRAVENOUS | Status: AC
  Administered 2015-07-06: 4 mg via INTRAVENOUS
  Filled 2015-07-06: qty 1

## 2015-07-06 MED ORDER — ONDANSETRON HCL 4 MG/2ML IJ SOLN: 4.0000 mg | Freq: Four times a day (QID) | INTRAMUSCULAR | Status: DC | PRN

## 2015-07-06 MED ORDER — HYDROCODONE-ACETAMINOPHEN 5-325 MG PO TABS: 1.0000 | ORAL_TABLET | Freq: Four times a day (QID) | ORAL | Status: DC | PRN

## 2015-07-06 MED ORDER — ACETAMINOPHEN 325 MG PO TABS: 650.0000 mg | ORAL_TABLET | ORAL | Status: DC | PRN

## 2015-07-06 MED ORDER — SODIUM CHLORIDE 0.9 % IJ SOLN: 3.0000 mL | Freq: Two times a day (BID) | INTRAMUSCULAR | Status: DC

## 2015-07-06 MED ORDER — TAMSULOSIN HCL 0.4 MG PO CAPS: 0.4000 mg | ORAL_CAPSULE | ORAL | Status: DC

## 2015-07-06 MED ORDER — NITROGLYCERIN 0.4 MG SL SUBL: 0.4000 mg | SUBLINGUAL_TABLET | SUBLINGUAL | Status: DC | PRN

## 2015-07-06 MED ORDER — SODIUM CHLORIDE 0.9 % IJ SOLN: 3.0000 mL | INTRAMUSCULAR | Status: DC | PRN

## 2015-07-06 MED ORDER — DOCUSATE SODIUM 100 MG PO CAPS: 100.0000 mg | ORAL_CAPSULE | Freq: Two times a day (BID) | ORAL | Status: DC

## 2015-07-06 NOTE — ED Notes (Signed)
Pt reports sternal chest pain starting yesterday around 1700, states minimal SHOB, no nausea, diaphoresis. Recent pacemake placement.

## 2015-07-06 NOTE — Progress Notes (Signed)
Echocardiogram 2D Echocardiogram has been performed.  Robert Anderson 07/06/2015, 8:34 AM

## 2015-07-06 NOTE — ED Notes (Signed)
Dr. Leonides Schanz back in to speak with pt

## 2015-07-06 NOTE — ED Notes (Signed)
Pacemaker has been interrogated.  ?

## 2015-07-06 NOTE — ED Notes (Signed)
ECHO being completed at bedside.

## 2015-07-06 NOTE — ED Notes (Signed)
Patient transported to CT 

## 2015-07-06 NOTE — ED Provider Notes (Addendum)
TIME SEEN: 4:35 AM  CHIEF COMPLAINT: Chest pain  HPI: Pt is a 81 y.o. male with history of atrial fibrillation, hypertension, CVA, HOCM, LBBB, history of severe aortic stenosis status post aortic valve replacement on 03/25/2015, recent pacemaker placement on November 8 for complete heart block with revision on November 10 after lead perforation causing pericardial effusion who presents to the emergency department with complaints of chest pain that started yesterday at 5 PM. He is unable to describe the pain other than stating that it is severe. It is worse with deep inspiration. Denies radiation of pain. Denies abdominal pain or back pain but is tender when I palpate the middle of his abdomen. Does not appear exertional or related to changes in position. He denies a shortness of breath, nausea vomiting, diaphoresis or dizziness. States this feels very different than the pain he had when he was admitted for his pericardial effusion, lead perforation. He denies any known history of CAD. He is on Plavix. Denies history of PE or DVT. No lower extremity swelling or pain. Has history of right 1 diastolic dysfunction with EF of 60-65%.  ROS: See HPI Constitutional: no fever  Eyes: no drainage  ENT: no runny nose   Cardiovascular:  no chest pain  Resp: no SOB  GI: no vomiting GU: no dysuria Integumentary: no rash  Allergy: no hives  Musculoskeletal: no leg swelling  Neurological: no slurred speech ROS otherwise negative  PAST MEDICAL HISTORY/PAST SURGICAL HISTORY:  Past Medical History  Diagnosis Date  . Essential hypertension   . ERECTILE DYSFUNCTION   . HEARING LOSS   . TIA (transient ischemic attack) 01/21/2014    5 min spell aphasia without assoc sx    . Severe aortic stenosis     a. s/p TAVR 03/2015. Pre-op  LHC 02/2015: minor nonobstructive CAD.  Marland Kitchen Hypertrophic obstructive cardiomyopathy ([place])   . Stroke ([place]) 02/19/2014    Small infarct high posterior left frontal lobe on MRI   . S/P TAVR  (transcatheter aortic valve replacement) 03/25/2015    29 mm Edwards Sapien 3 transcatheter heart valve placed via open right transfemoral approach  . LBBB (left bundle branch block)   . First degree AV block   . Bradycardia     a. HR 30s in clinic 06/2015 - BB discontinued.  Marland Kitchen Heart block     s/p Pacemaker  . DEGENERATIVE JOINT DISEASE, SPINE   . OA (osteoarthritis)     both hips  . Hyperlipidemia   . Presence of permanent cardiac pacemaker 06/24/2015  . Squamous cell skin cancer     left lower leg  . Prostate cancer [facility]) 2010    "external radiation; 40 treatments"  . Pleural effusion 06/25/2015    "large"/report 06/25/2015  . Pericardial effusion     a. micorpeforation s/p lead revision 06/26/15  . Atrial fibrillation ([place])     a. dx after lead revision 06/26/15 >> anticoag not started due to recent pericardial effusion in setting of perforation and lead revision    MEDICATIONS:  Prior to Admission medications   Medication Sig Start Date End Date Taking? Authorizing Provider  acetaminophen (TYLENOL) 325 MG tablet Take 1-2 tablets (325-650 mg total) by mouth every 4 (four) hours as needed for mild pain. 06/28/15   Liliane Shi, PA-C  clopidogrel (PLAVIX) 75 MG tablet TAKE 1 TABLET (75 MG TOTAL) BY MOUTH DAILY. Patient taking differently: Take 75 mg by mouth daily.  05/27/15   Dorena Cookey, MD  colchicine  0.6 MG tablet Take 1 tablet (0.6 mg total) by mouth daily. For a total of 3 weeks, then stop 06/28/15   Liliane Shi, PA-C  docusate sodium (COLACE) 100 MG capsule Take 200 mg by mouth daily.     Historical Provider, MD  metoprolol tartrate (LOPRESSOR) 25 MG tablet Take 0.5 tablets (12.5 mg total) by mouth 2 (two) times daily. 06/25/15   Renee Dyane Dustman, PA-C  pravastatin (PRAVACHOL) 10 MG tablet Take 1 tablet (10 mg total) by mouth at bedtime. 05/27/15   Dorena Cookey, MD  tamsulosin (FLOMAX) 0.4 MG CAPS capsule Take 1 capsule (0.4 mg total) by mouth 3 (three) times a week.  Monday Wednesday Fridday Patient taking differently: Take 0.4 mg by mouth 3 (three) times a week. Monday, Wednesday and Friday at bedtime 05/27/15   Dorena Cookey, MD    ALLERGIES:  No Known Allergies  SOCIAL HISTORY:  Social History  Substance Use Topics  . Smoking status: Never Smoker   . Smokeless tobacco: Never Used  . Alcohol Use: No    FAMILY HISTORY: Family History  Problem Relation Age of Onset  . Heart disease Mother   . Cancer Father   . Cancer Sister   . Prostate cancer Brother   . Heart disease Brother   . Lung cancer Brother   . CVA Mother   . CVA Father   . Heart attack Brother   . Hypertension Neg Hx   . Stroke Mother     EXAM: BP 122/67 mmHg  Pulse 68  Temp(Src) 98.1 F (36.7 C)  Resp 18  Ht 5\' 9"  (1.753 m)  Wt 208 lb (94.348 kg)  BMI 30.70 kg/m2  SpO2 99% CONSTITUTIONAL: Alert and oriented and responds appropriately to questions. Elderly, appears uncomfortable, grimacing intermittently in pain HEAD: Normocephalic EYES: Conjunctivae clear, PERRL ENT: normal nose; no rhinorrhea; moist mucous membranes; pharynx without lesions noted NECK: Supple, no meningismus, no LAD  CARD: RRR; S1 and S2 appreciated; + murmur, no clicks, no rubs, no gallops CHEST:  Patient has a pacemaker noted in the left chest wall with associated yellow appearing ecchymosis with healing incision sites without tenderness, erythema or warmth, no fluctuance or induration; chest wall is nontender to palpation without crepitus, deformity, rash or lesions RESP: Normal chest excursion without splinting or tachypnea; breath sounds clear and equal bilaterally; no wheezes, no rhonchi, no rales, no hypoxia or respiratory distress, speaking full sentences ABD/GI: Normal bowel sounds; non-distended; soft, tender throughout the mid abdomen without pulsatile mass or ecchymosis, no rebound, no guarding, no peritoneal signs BACK:  The back appears normal and is non-tender to palpation, there is  no CVA tenderness EXT: Normal ROM in all joints; non-tender to palpation; no edema; normal capillary refill; no cyanosis, no calf tenderness or swelling    SKIN: Normal color for age and race; warm NEURO: Moves all extremities equally, sensation to light touch intact diffusely, cranial nerves II through XII intact PSYCH: The patient's mood and manner are appropriate. Grooming and personal hygiene are appropriate.  MEDICAL DECISION MAKING: Patient here with chest pain. Differential diagnosis includes ACS, PE, dissection, pericardial effusion, pneumonia, pericarditis. Bedside ultrasound shows no large pericardial effusion and normal cardiac contractility. EKG shows paced rhythm. Labs, chest x-ray pending. If creatinine normal will obtain CT of his chest, abdomen and pelvis to rule out dissection as he is complaining of severe chest pain and is tender throughout the abdomen.  ED PROGRESS: Patient continues to have pain despite IV  morphine. We'll continue pain control.    6:30 AM  Pt's labs unremarkable. Normal creatinine, LFTs and lipase. Troponin negative. CT shows no acute thoracic or abdominal aorta. He does have mild/moderate atherosclerosis without aneurysm or dissection. There is no pneumothorax. No pneumonia. No pulmonary edema. He does have a small hiatal hernia. He has a small high density pericardial effusion. Likely from recent microperforation. Bedside ultrasound performed in the emergency room shows no large effusion and he has normal contractility. No sign of RV strain on my ultrasound. Pacemaker interrogated with no abnormalities. Discussed with Dr. Radford Pax with cardiology. Will order a stat formal echocardiogram in the emergency department and continue to cycle troponins. Given she is with a high acuity patient she will discuss this with her oncoming colleague who will see the patient in the emergency department.   7:30 AM  D/w Dr. Stanford Breed with cardiology who will see pt in  consult.  8:00 AM  Awaiting cardiology disposition.  Signed out to Dr. Johnney Killian.  Pt getting echo now.   EKG Interpretation  Date/Time:  Sunday July 06 2015 04:33:27 EST Ventricular Rate:  79 PR Interval:  200 QRS Duration: 160 QT Interval:  434 QTC Calculation: 497 R Axis:   -91 Text Interpretation:  Sinus rhythm Right bundle branch block Left bundle branch block Anterolateral infarct, old Baseline wander in lead(s) II III aVR aVL aVF V1 V3 V5 Confirmed by Teran Daughenbaugh,  DO, Leisel Pinette [phone]) on 07/06/2015 5:58:24 AM         EKG Interpretation  Date/Time:  Sunday July 06 2015 04:40:08 EST Ventricular Rate:  63 PR Interval:  211 QRS Duration: 162 QT Interval:  471 QTC Calculation: 482 R Axis:   -88 Text Interpretation:  Atrial-paced rhythm IVCD, consider atypical RBBB LVH with IVCD, LAD and secondary repol abnrm Anterolateral infarct, old Baseline wander in lead(s) V3 Confirmed by Yaqub Arney,  DO, Bettyjo Lundblad ([id number]) on 07/06/2015 5:59:56 AM         Delice Bison Naomia Lenderman, DO 07/06/15 [place] Braiden Presutti, DO 07/06/15 [place], DO 07/06/15 [place] Lizandro Spellman, DO 07/06/15 [id number]

## 2015-07-06 NOTE — H&P (Signed)
CARDIOLOGY HISTORY AND PHYSICAL   Patient ID: KESTIN CONATSER MRN: [id number] DOB/AGE: 79-Feb-1925 79 y.o.  Admit date: 07/06/2015  Primary Physician   TODD,JEFFREY Zenia Resides, MD Primary Cardiologist   Dr Meda Coffee Electrophysiologist: Dr Lovena Le Reason for Consultation   Chest pain  HPI:Karthikeya A Longmire is a 81 y.o. year old male with a history of atrial fibrillation, hypertension, CVA, HOCM, LBBB, history of severe aortic stenosis status post aortic valve replacement on 03/25/2015, recent pacemaker placement on November 8 for complete heart block with revision on November 10 after lead perforation causing pericardial effusion.   He came to the ER with chest pain, cardiology to evaluate.  Mr. Lubinski has been gradually getting stronger since his recent discharge (06/28/2015). Yesterday, he was exercising his arm by putting it straight out from his shoulder. After that, he developed left shoulder pain which was relieved by Tylenol. However, he then developed substernal chest pain. Pain is worse with deep inspiration. He also describes pain with his heartbeat. No real change w/ position changes. Slightly different from previous chest pain after microperforation.   Past Medical History  Diagnosis Date  . Essential hypertension   . ERECTILE DYSFUNCTION   . HEARING LOSS   . TIA (transient ischemic attack) 01/21/2014    5 min spell aphasia without assoc sx    . Severe aortic stenosis     a. s/p TAVR 03/2015. Pre-op  LHC 02/2015: minor nonobstructive CAD.  Marland Kitchen Hypertrophic obstructive cardiomyopathy ([place])   . Stroke ([place]) 02/19/2014    Small infarct high posterior left frontal lobe on MRI   . 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 LBBB (left bundle branch block)   . First degree AV block   . Bradycardia     a. HR 30s in clinic 06/2015 - BB discontinued.  Marland Kitchen Heart block     s/p Pacemaker  .  DEGENERATIVE JOINT DISEASE, SPINE   . OA (osteoarthritis)     both hips  . Hyperlipidemia   . Presence of permanent cardiac pacemaker 06/24/2015    St Jude Assurity  . Squamous cell skin cancer     left lower leg  . Prostate cancer [facility]) 2010    "external radiation; 40 treatments"  . Pericardial effusion     a. micorpeforation s/p lead revision 06/26/15  . Atrial fibrillation ([place])     a. dx after lead revision 06/26/15 >> anticoag not started due to recent pericardial effusion in setting of perforation and lead revision     Past Surgical History  Procedure Laterality Date  . Joint replacement    . Cataract extraction w/ intraocular lens  implant, bilateral  2004; 2010    right; left  . Tee without cardioversion N/A 03/07/2015    Procedure: TRANSESOPHAGEAL ECHOCARDIOGRAM (TEE);  Surgeon: Dorothy Spark, MD;  Location: [place];  Service: Cardiovascular;  Laterality: N/A;  . Total shoulder arthroplasty Right 2007    "wore it out playing tennis"  . Tonsillectomy    . Anterior cervical decomp/discectomy fusion  ~ 2000    Dr. Louanne Skye  . Transcatheter aortic valve replacement, transfemoral N/A 03/25/2015    Procedure: TRANSCATHETER AORTIC VALVE REPLACEMENT, TRANSFEMORAL;  Surgeon: Burnell Blanks, MD;  Location: [place];  Service: Open Heart Surgery;  Laterality: N/A;  . Tee without cardioversion N/A 03/25/2015    Procedure: TRANSESOPHAGEAL ECHOCARDIOGRAM (TEE);  Surgeon: Burnell Blanks, MD;  Location: [place];  Service: Open Heart Surgery;  Laterality: N/A;  . Back surgery    . Cardiac catheterization N/A 02/20/2015    Procedure: Right/Left Heart Cath and Coronary Angiography;  Surgeon: Sherren Mocha, MD;  Location: [place] CV LAB;  Service: Cardiovascular;  Laterality: N/A;  . Cardiac valve replacement    . Insert / replace / remove pacemaker  06/24/2015  . Squamous cell carcinoma excision Left X 3    "lower leg"  . Ep implantable device N/A 06/24/2015    Procedure:  Pacemaker Implant;  Surgeon: Evans Lance, MD;  Location: [place] CV LAB;  Service: Cardiovascular; St Jude Assurity   . Ep implantable device N/A 06/26/2015    Procedure: Lead Revision/Repair;  Surgeon: Will Meredith Leeds, MD;  Location: [place] CV LAB;  Service: Cardiovascular;  Laterality: N/A;    No Known Allergies  I have reviewed the patient's current medications   . sodium chloride 125 mL/hr at 07/06/15 0524   Medication Sig  acetaminophen (TYLENOL) 325 MG tablet Take 1-2 tablets (325-650 mg total) by mouth every 4 (four) hours as needed for mild pain.  clopidogrel (PLAVIX) 75 MG tablet TAKE 1 TABLET (75 MG TOTAL) BY MOUTH DAILY. Patient taking differently: Take 75 mg by mouth daily.   colchicine 0.6 MG tablet Take 1 tablet (0.6 mg total) by mouth daily. For a total of 3 weeks, then stop  docusate sodium (COLACE) 100 MG capsule Take 200 mg by mouth daily.   metoprolol tartrate (LOPRESSOR) 25 MG tablet Take 0.5 tablets (12.5 mg total) by mouth 2 (two) times daily.  pravastatin (PRAVACHOL) 10 MG tablet Take 1 tablet (10 mg total) by mouth at bedtime.  tamsulosin (FLOMAX) 0.4 MG CAPS capsule Take 1 capsule (0.4 mg total) by mouth 3 (three) times a week. Monday Wednesday Fridday Patient taking differently: Take 0.4 mg by mouth 3 (three) times a week. Monday, Wednesday and Friday at bedtime     Social History   Social History  . Marital Status: Married    Spouse Name: N/A  . Number of Children: N/A  . Years of Education: N/A   Occupational History  . Retired    Social History Main Topics  . Smoking status: Never Smoker   . Smokeless tobacco: Never Used  . Alcohol Use: No  . Drug Use: No  . Sexual Activity: Not Currently   Other Topics Concern  . Not on file   Social History Narrative   Patient lives in [place] with his wife.    Family Status  Relation Status Death Age  . Mother Deceased 73  . Father Deceased 70  . Sister Deceased   . Brother  Deceased   . Maternal Grandmother Deceased   . Maternal Grandfather Deceased   . Paternal Grandmother Deceased   . Paternal Grandfather Deceased    Family History  Problem Relation Age of Onset  . Heart disease Mother   . Cancer Father   . Cancer Sister   . Prostate cancer Brother   . Heart disease Brother   . Lung cancer Brother   . CVA Mother   . CVA Father   . Heart attack Brother   . Hypertension Neg Hx   . Stroke Mother      ROS:  Full 14 point review of systems complete and found to be negative unless listed above.  Physical Exam: Blood pressure 122/55, pulse 65, temperature 98.1 F (36.7 C),  resp. rate 15, height 5\' 9"  (1.753 m), weight 208 lb (94.348 kg), SpO2 95 %.  General: Well developed, well nourished, male in no acute distress Head: Eyes PERRLA, No xanthomas.   Normocephalic and atraumatic, oropharynx without edema or exudate. Dentition: OK Lungs: slightly decreased BS bases Heart: HRRR S1 S2, no rub/gallop, soft murmur. pulses are 2+ upper extrem.  Slightly decreased both lower extrem Neck: soft R carotid bruit. No lymphadenopathy.  JVD not elevated. Abdomen: Bowel sounds present, abdomen soft and non-tender without masses or hernias noted. Msk:  No spine or cva tenderness. No weakness, no joint deformities or effusions. Extremities: No clubbing or cyanosis. No edema.  Neuro: Alert and oriented X 3. No focal deficits noted. Psych:  Good affect, responds appropriately Skin: No rashes or lesions noted.  Labs:   Lab Results  Component Value Date   WBC 10.9* 07/06/2015   HGB 16.7 07/06/2015   HCT 49.0 07/06/2015   MCV 90.1 07/06/2015   PLT 238 07/06/2015     Recent Labs Lab 07/06/15 0441 07/06/15 0448  NA 138 140  K 4.4 4.3  CL 106 102  CO2 25  --   BUN 18 21*  CREATININE 1.04 0.90  CALCIUM 9.7  --   PROT 6.7  --   BILITOT 0.7  --   ALKPHOS 67  --   ALT 18  --   AST 18  --   GLUCOSE 142* 138*  ALBUMIN 3.6  --    No results for input(s):  CKTOTAL, CKMB, TROPONINI in the last 72 hours.  Recent Labs  07/06/15 0447 07/06/15 0802  TROPIPOC 0.02 0.03   Lab Results  Component Value Date   CHOL 158 05/20/2015   HDL 45.10 05/20/2015   LDLCALC 80 05/20/2015   TRIG 161.0* 05/20/2015   LIPASE  Date/Time Value Ref Range Status  07/06/2015 04:41 AM 31 11 - 51 U/L Final   TSH  Date/Time Value Ref Range Status  06/23/2015 05:23 PM 1.704 0.350 - 4.500 uIU/mL Final  05/20/2015 08:48 AM 2.68 0.35 - 4.50 uIU/mL Final   Echo: 06/27/2015 - Left ventricle: The cavity size was normal. Wall thickness was increased in a pattern of moderate LVH. Systolic function was vigorous. The estimated ejection fraction was in the range of 65% to 70%. Wall motion was normal; there were no regional wall motion abnormalities. Doppler parameters are consistent with abnormal left ventricular relaxation (grade 1 diastolic dysfunction). - Mitral valve: Mildly calcified annulus. - Left atrium: The atrium was mildly dilated. - Pericardium, extracardiac: A trivial pericardial effusion was identified.  ECG:  A-V pacing  Cardiac Cath: 02/20/2015 FINAL CONCLUSIONS:  Minor nonobstructive coronary artery disease  Known severe aortic stenosis with a heavily calcified aortic valve and restricted aortic valve leaflet motion on plain fluoroscopy  Normal right heart hemodynamics  Radiology:  Dg Chest Portable 1 View  07/06/2015  CLINICAL DATA:  Chest pain tonight, recent pacemaker insertion. History of hypertension. EXAM: PORTABLE CHEST 1 VIEW COMPARISON:  Chest radiograph June 27, 2015 FINDINGS: Similar cardiomegaly, mediastinal silhouette is nonsuspicious. Calcified aortic knob. Cardiac valve replacement. No pleural effusion or focal consolidation. Bibasilar strandy densities with RIGHT lung base pleural thickening. Dual lead LEFT cardiac pacemaker in situ. No pneumothorax. Osteopenia. RIGHT shoulder arthroplasty. IMPRESSION: Stable  cardiomegaly, bibasilar atelectasis and pleural thickening. Electronically Signed   By: Elon Alas M.D.   On: 07/06/2015 05:10   Ct Angio Chest Aorta W/cm &/or Wo/cm  07/06/2015  CLINICAL DATA:  Sternal chest  pain. Shortness of breath. Chest and abdominal pain. Recent pacemaker placement. EXAM: CT ANGIOGRAPHY CHEST, ABDOMEN AND PELVIS TECHNIQUE: Multidetector CT imaging through the chest, abdomen and pelvis was performed using the standard protocol during bolus administration of intravenous contrast. Multiplanar reconstructed images and MIPs were obtained and reviewed to evaluate the vascular anatomy. CONTRAST:  168mL OMNIPAQUE IOHEXOL 350 MG/ML SOLN COMPARISON:  Chest radiograph earlier this day. CT angiography of the chest abdomen pelvis 02/27/2015 FINDINGS: CTA CHEST FINDINGS Normal caliber thoracic aorta without aneurysm, dissection, acute aortic syndrome or hematoma. Mild atherosclerosis. Post aortic valve replacement. Left-sided pacemaker in place, leads within the right atrium and ventricle. Small high-density pericardial effusion measuring up to 1.2 cm in depth. No pericardial thickening, enhancement or internal air. Cardiomegaly with coronary artery calcifications. No filling defects in the central pulmonary arteries. No mediastinal or hilar adenopathy. No pneumothorax. Mild motion artifact limits detailed evaluation. Compressive atelectasis in the right middle lobe, linear atelectasis in bilateral lower lobes. No consolidation to suggest pneumonia. No pulmonary mass or evident nodule. Review of the MIP images confirms the above findings. CTA ABDOMEN AND PELVIS FINDINGS Tortuous normal caliber abdominal aorta without aneurysm or dissection. Moderate calcified and noncalcified atheromatous plaque throughout. Patent celiac artery, SMA, and IMA. Patent single bilateral renal arteries. Small flap defect within the right external iliac artery is unchanged from prior exam, better visualized due to  phase of contrast bolus timing. Arterial phase imaging of the liver, gallbladder, spleen, pancreas, and adrenal glands are unremarkable. Symmetric renal enhancement without hydronephrosis. Cyst in the interpolar left kidney. Stomach is decompressed. Small hiatal hernia. There are no dilated or thickened bowel loops. Diverticulosis of the distal colon without diverticulitis. Appendix is normal. No free air, free fluid, or intra-abdominal fluid collection. In the pelvis the bladder is physiologically distended. No pelvic free fluid. Fat within both inguinal canals. Review of the osseous structures of the chest abdomen and pelvis demonstrates multilevel degenerative change throughout the thoracic and lumbar spine. There are no acute or suspicious osseous abnormalities. Review of the MIP images confirms the above findings. IMPRESSION: 1. No acute abnormality of the thoracic or abdominal aorta. Mild moderate atherosclerosis without aneurysm or dissection. 2. Small high-density pericardial effusion, likely sequela of pacemaker placement. This measures 1.2 cm in depth. 3. Scattered atelectasis within both lungs. Chronic findings in the abdomen and pelvis as described. Electronically Signed   By: Jeb Levering M.D.   On: 07/06/2015 06:13    ASSESSMENT AND PLAN:   The patient was seen today by Dr Stanford Breed, the patient evaluated and the data reviewed.  Principal Problem:   Pericarditis, acute - no rub on exam but sx are c/w this - rx w/ Ibuprofen 600 tid - hold colchicine for now. - EP to see in am  Active Problems:   Essential hypertension - good control now    Hypertrophic obstructive cardiomyopathy (HCC) - no volume overload, maintaining BP    Pericardial effusion secondary to Pacemaker lead microperforation - minimal on preliminary report    Cardiac pacemaker in situ - St Jude Assurity - will get device interrogated - sounds like diaphragmatic stimulation, see if setting changes will help - EP  to see in am  Signed: Lenoard Aden 07/06/2015 8:36 AM Beeper [id number]  Co-Sign MD As above, patient seen and examined. Briefly he is an 81 year old male with past medical history of recent TAVR, recent pacemaker placement followed by lead revision for microperforation, hypertrophic obstructive cardiomyopathy, paroxysmal atrial fibrillation with chest pain. Patient  recently discharged following admission for chest pain. This was felt to be secondary to lead perforation which required lead revision. He was doing well at home until yesterday afternoon. He states he was exercising his left upper extremity and developed chest pain. The pain worsened throughout the day and night and he presented to the emergency room. It increases with inspiration but is unlike his previous pain by his report. He also states it increases with each heartbeat. He denies dyspnea, palpitations, syncope or fever. Quick look echocardiogram shows trivial if any pericardial effusion. Chest CT shows no dissection with small pericardial effusion. Electrocardiogram shows AV pacing. Chest pain likely secondary to pericarditis. Will treat with nonsteroidal. We will have his device interrogated. Await final echocardiogram results. We'll ask electrophysiology to review tomorrow morning. Question diaphragmatic stimulation. Kirk Ruths

## 2015-07-06 NOTE — ED Notes (Signed)
Robert Anderson, [name] came back over and explained that the patient was still having cp.

## 2015-07-06 NOTE — ED Notes (Signed)
Cardiologist, MD at bedside

## 2015-07-06 NOTE — ED Notes (Signed)
Given coffee to spouse and friend

## 2015-07-07 DIAGNOSIS — I309 Acute pericarditis, unspecified: Secondary | ICD-10-CM | POA: Diagnosis not present

## 2015-07-07 LAB — COMPREHENSIVE METABOLIC PANEL
ALBUMIN: 2.7 g/dL — AB (ref 3.5–5.0)
ALK PHOS: 49 U/L (ref 38–126)
ALT: 12 U/L — ABNORMAL LOW (ref 17–63)
ANION GAP: 4 — AB (ref 5–15)
AST: 13 U/L — ABNORMAL LOW (ref 15–41)
BUN: 17 mg/dL (ref 6–20)
CALCIUM: 9 mg/dL (ref 8.9–10.3)
CHLORIDE: 109 mmol/L (ref 101–111)
CO2: 28 mmol/L (ref 22–32)
Creatinine, Ser: 1.03 mg/dL (ref 0.61–1.24)
GFR calc non Af Amer: >60 mL/min (ref >60)
Glucose, Bld: 120 mg/dL — ABNORMAL HIGH (ref 65–99)
POTASSIUM: 4.3 mmol/L (ref 3.5–5.1)
SODIUM: 141 mmol/L (ref 135–145)
Total Bilirubin: 0.7 mg/dL (ref 0.3–1.2)
Total Protein: 5.1 g/dL — ABNORMAL LOW (ref 6.5–8.1)

## 2015-07-07 LAB — CBC
HEMATOCRIT: 39.7 % (ref 39.0–52.0)
HEMOGLOBIN: 12.6 g/dL — AB (ref 13.0–17.0)
MCH: 29 pg (ref 26.0–34.0)
MCHC: 31.7 g/dL (ref 30.0–36.0)
MCV: 91.5 fL (ref 78.0–100.0)
Platelets: 188 10*3/uL (ref 150–400)
RBC: 4.34 MIL/uL (ref 4.22–5.81)
RDW: 13.1 % (ref 11.5–15.5)
WBC: 6.2 10*3/uL (ref 4.0–10.5)

## 2015-07-07 MED ORDER — COLCHICINE 0.6 MG PO TABS
0.6000 mg | ORAL_TABLET | Freq: Every day | ORAL | Status: DC
  Administered 2015-07-07: 0.6 mg via ORAL
  Filled 2015-07-07: qty 1

## 2015-07-07 MED ORDER — IBUPROFEN 200 MG PO TABS: ORAL_TABLET | ORAL | Status: DC

## 2015-07-07 MED ORDER — COLCHICINE 0.6 MG PO TABS: 0.6000 mg | ORAL_TABLET | Freq: Every day | ORAL | Status: DC

## 2015-07-07 MED ORDER — PANTOPRAZOLE SODIUM 40 MG PO TBEC: 40.0000 mg | DELAYED_RELEASE_TABLET | Freq: Every day | ORAL | Status: DC

## 2015-07-07 NOTE — Discharge Summary (Addendum)
Discharge Summary   Patient ID: Robert Anderson,  MRN: [id number], DOB/AGE: [birth year] 82 y.o.  Admit date: 07/06/2015 Discharge date: 07/07/2015  Primary Care Provider: TODD,JEFFREY ALLEN Primary Cardiologist: Dr Meda Coffee Electrophysiologist: Dr Lovena Le  Discharge Diagnoses Principal Problem:   Pericarditis, acute Active Problems:   Essential hypertension   Hypertrophic obstructive cardiomyopathy (HCC)   Pericardial effusion secondary to Pacemaker lead microperforation   Cardiac pacemaker in situ - St Jude Assurity   Allergies No Known Allergies  Procedures  CTA of chest and abdomen  07/06/2015 IMPRESSION: 1. No acute abnormality of the thoracic or abdominal aorta. Mild moderate atherosclerosis without aneurysm or dissection. 2. Small high-density pericardial effusion, likely sequela of pacemaker placement. This measures 1.2 cm in depth. 3. Scattered atelectasis within both lungs. Chronic findings in the abdomen and pelvis as described.    Echocardiogram 07/06/2015 LV EF: 60% -  65%  ------------------------------------------------------------------- Indications:   Chest pain 786.51.  ------------------------------------------------------------------- Study Conclusions  - Left ventricle: The cavity size was normal. There was moderate asymmetric septal hypertrophy. Systolic function was normal. The estimated ejection fraction was in the range of 60% to 65%. Wall motion was normal; there were no regional wall motion abnormalities. Doppler parameters are consistent with abnormal left ventricular relaxation (grade 1 diastolic dysfunction). - Aortic valve: Bioprosthetic aortic valve s/p TAVR. Trivial peri-valvular aortic insufficiency. There was a mild LV outflow tract gradient. However, this was not fully separated out from the gradient across the aortic valve. I do not think that the gradient across the bioprosthetic aortic valve was  significantly elevated. Mean gradient (S): 12 mm Hg. Peak gradient (S): 29 mm Hg. - Mitral valve: There was systolic anterior motion of the anterior leaflet of the mitral valve. Moderately calcified annulus. There was mild to moderate regurgitation. - Left atrium: The atrium was moderately dilated. - Right ventricle: The cavity size was normal. Pacer wire or catheter noted in right ventricle. Systolic function was normal. - Tricuspid valve: Peak RV-RA gradient (S): 29 mm Hg. - Pulmonary arteries: PA peak pressure: 32 mm Hg (S). - Inferior vena cava: The vessel was normal in size. The respirophasic diameter changes were in the normal range (>= 50%), consistent with normal central venous pressure. - Pericardium, extracardiac: A trivial pericardial effusion was identified posterior to the heart.  Impressions:  - Normal LV size with moderate asymmetric septal hypertrophy. EF 60-65%. There was systolic anterior motion of the anterior leaflet of the mitral valve with mild to moderate MR. There was a bioprosthetic aortic valve s/p TAVR with trivial AI that appeared to function normally. There appeared to be a small LV outflow tract gradient likely related to the [facility], but this was not fully delineated from the gradient across the aortic valve. Overall picture consistent with HOCM.    Hospital Course  Robert Anderson is 81 yo male with PMH of afib, HTN, CVA, HOCM, LBBB and h/o severe AS s/p AVR 03/25/2015, CHB s/p St Jude PPM 11/8 with revision on 11/10 after lead formation causing pericardial effusion. He presented to [facility] on 07/07[facility]2015 with chest pain which has been gradually getting stronger since his recent discharge on 11/12. He was admitted to cardiology service, electrophysiology service was asked to evaluate the patient given recent PPM placement. it was felt his chest pain is likely secondary to pericarditis. CTA of the abdomen and the chest showed  no acute abnormality of thoracic or abdominal aorta, no aneurysm or dissection seen, small hyperdensity pericardial effusion, scattered atelectasis. Echocardiogram  was obtained on the same day which showed EF 60-65%, no regional wall motion abnormality, grade 1 diastolic dysfunction, bioprosthetic aortic valve s/p CABG, trivial perivalvular aortic insufficiency, pacer wire in the right ventricle, PA peak pressure 34mmHg, positive HOCM.  He was seen on the following morning on 11/21, at which time his chest discomfort is getting better. His pacemaker has been interrogated and working normally. He has no diaphragmatic stimulation. He is deemed stable for discharge from electrophysiology perspective. We will continue daily colchicine in addition of 600 mg Motrin twice a day for 2 weeks with food (400 mg twice a day for 2 weeks then 200 mg twice a day for 2 weeks. The plan is to stop colchicine on his next follow-up. I have given him 2 month of protonix for GI protection while taking Motrin.   Discharge Vitals Blood pressure 134/60, pulse 60, temperature 97.6 F (36.4 C), temperature source Oral, resp. rate 18, height 5\' 9"  (1.753 m), weight 210 lb 15.7 oz (95.7 kg), SpO2 94 %.  Filed Weights   07/06/15 0443 07/06/15 1001 07/07/15 0321  Weight: 208 lb (94.348 kg) 210 lb 5.1 oz (95.4 kg) 210 lb 15.7 oz (95.7 kg)    Labs  CBC  Recent Labs  07/06/15 0441 07/06/15 0448 07/07/15 0220  WBC 10.9*  --  6.2  HGB 14.8 16.7 12.6*  HCT 45.4 49.0 39.7  MCV 90.1  --  91.5  PLT 238  --  0000000   Basic Metabolic Panel  Recent Labs  07/06/15 0441 07/06/15 0448 07/07/15 0220  NA 138 140 141  K 4.4 4.3 4.3  CL 106 102 109  CO2 25  --  28  GLUCOSE 142* 138* 120*  BUN 18 21* 17  CREATININE 1.04 0.90 1.03  CALCIUM 9.7  --  9.0   Liver Function Tests  Recent Labs  07/06/15 0441 07/07/15 0220  AST 18 13*  ALT 18 12*  ALKPHOS 67 49  BILITOT 0.7 0.7  PROT 6.7 5.1*  ALBUMIN 3.6 2.7*    Recent  Labs  07/06/15 0441  LIPASE 31   Disposition  Pt is being discharged home today in good condition.  Follow-up Plans & Appointments      Follow-up Information    Follow up with TODD,JEFFREY Zenia Resides, MD.   Specialty:  Family Medicine   Why:  Followup with your primary care provider   Contact information:   [place] [address] [phone]       Follow up with Dorothy Spark, MD On 08/05/2015.   Specialty:  Cardiology   Why:  @11 :00AM   Contact information:   [address] [place] [address] [phone]       Follow up with Cristopher Peru, MD On 09/25/2015.   Specialty:  Cardiology   Why:  @11 :45am.   Contact information:   [address] [place] [address] [phone]       Discharge Medications    Medication List    TAKE these medications        acetaminophen 325 MG tablet  Commonly known as:  TYLENOL  Take 1-2 tablets (325-650 mg total) by mouth every 4 (four) hours as needed for mild pain.     clopidogrel 75 MG tablet  Commonly known as:  PLAVIX  TAKE 1 TABLET (75 MG TOTAL) BY MOUTH DAILY.     colchicine 0.6 MG tablet  Take 1 tablet (0.6 mg total) by mouth  daily. For a total of 3 weeks, then stop     docusate sodium 100 MG capsule  Commonly known as:  COLACE  Take 200 mg by mouth daily.     docusate sodium 100 MG capsule  Commonly known as:  COLACE  Take 1 capsule (100 mg total) by mouth every 12 (twelve) hours.     HYDROcodone-acetaminophen 5-325 MG tablet  Commonly known as:  NORCO/VICODIN  Take 1 tablet by mouth every 6 (six) hours as needed.     ibuprofen 200 MG tablet  Commonly known as:  ADVIL,MOTRIN  Take 600mg  (3 of 200mg  pills) twice a day for 2 weeks, decrease to 400mg  twice a day for 2 weeks, then 200mg  twice a day for 2 weeks     metoprolol tartrate 25 MG tablet  Commonly known as:  LOPRESSOR  Take 0.5 tablets (12.5 mg total) by mouth 2 (two) times daily.      pantoprazole 40 MG tablet  Commonly known as:  PROTONIX  Take 1 tablet (40 mg total) by mouth daily.     pravastatin 10 MG tablet  Commonly known as:  PRAVACHOL  Take 1 tablet (10 mg total) by mouth at bedtime.     tamsulosin 0.4 MG Caps capsule  Commonly known as:  FLOMAX  Take 1 capsule (0.4 mg total) by mouth 3 (three) times a week. Monday Wednesday Fridday        Duration of Discharge Encounter   Greater than 30 minutes including physician time.  Hilbert Corrigan PA-C Pager: [phone] 07/07/2015, 9:21 AM   EP Attending  Patient seen and examined. Agree with findings above. [place] for discharge.  Mikle Bosworth.D.

## 2015-07-07 NOTE — Progress Notes (Addendum)
Patient ID: Robert Anderson, male   DOB: [birth year], 81 y.o.   MRN: [id number]    Patient Name: Robert Anderson Date of Encounter: 07/07/2015     Principal Problem:   Pericarditis, acute Active Problems:   Essential hypertension   Hypertrophic obstructive cardiomyopathy (HCC)   Pericardial effusion secondary to Pacemaker lead microperforation   Cardiac pacemaker in situ - [employer]   Pericarditis    SUBJECTIVE  "I feel better." Denies chest pain.  CURRENT MEDS . clopidogrel  75 mg Oral Daily  . docusate sodium  200 mg Oral Daily  . enoxaparin (LOVENOX) injection  40 mg Subcutaneous Q24H  . feeding supplement (ENSURE ENLIVE)  237 mL Oral BID BM  . ibuprofen  600 mg Oral TID  . metoprolol tartrate  12.5 mg Oral BID  . pravastatin  10 mg Oral QHS  . sodium chloride  3 mL Intravenous Q12H  . tamsulosin  0.4 mg Oral Once per day on Mon Wed Fri    OBJECTIVE  Filed Vitals:   07/06/15 1330 07/06/15 2015 07/07/15 0318 07/07/15 0321  BP: 104/46 130/50 134/60   Pulse: 63 59 60   Temp: 97.6 F (36.4 C) 97.4 F (36.3 C) 97.6 F (36.4 C)   TempSrc: Oral Oral Oral   Resp: 19 20 18    Height:      Weight:    210 lb 15.7 oz (95.7 kg)  SpO2: 95% 95% 94%     Intake/Output Summary (Last 24 hours) at 07/07/15 0845 Last data filed at 07/07/15 0316  Gross per 24 hour  Intake    240 ml  Output   1100 ml  Net   -860 ml   Filed Weights   07/06/15 0443 07/06/15 1001 07/07/15 0321  Weight: 208 lb (94.348 kg) 210 lb 5.1 oz (95.4 kg) 210 lb 15.7 oz (95.7 kg)    PHYSICAL EXAM  General: Pleasant, elderly man, NAD. Neuro: Alert and oriented X 3. Moves all extremities spontaneously. Psych: Normal affect. HEENT:  Normal  Neck: Supple without bruits or JVD. Lungs:  Resp regular and unlabored, CTA. Heart: RRR no s3, s4, or murmurs. Abdomen: Soft, non-tender, non-distended, BS + x 4.  Extremities: No clubbing, cyanosis or edema. DP/PT/Radials 2+ and equal bilaterally.  Accessory  Clinical Findings  CBC  Recent Labs  07/06/15 0441 07/06/15 0448 07/07/15 0220  WBC 10.9*  --  6.2  HGB 14.8 16.7 12.6*  HCT 45.4 49.0 39.7  MCV 90.1  --  91.5  PLT 238  --  188   Basic Metabolic Panel  Recent Labs  07/06/15 0441 07/06/15 0448 07/07/15 0220  NA 138 140 141  K 4.4 4.3 4.3  CL 106 102 109  CO2 25  --  28  GLUCOSE 142* 138* 120*  BUN 18 21* 17  CREATININE 1.04 0.90 1.03  CALCIUM 9.7  --  9.0   Liver Function Tests  Recent Labs  07/06/15 0441 07/07/15 0220  AST 18 13*  ALT 18 12*  ALKPHOS 67 49  BILITOT 0.7 0.7  PROT 6.7 5.1*  ALBUMIN 3.6 2.7*    Recent Labs  07/06/15 0441  LIPASE 31   Cardiac Enzymes No results for input(s): CKTOTAL, CKMB, CKMBINDEX, TROPONINI in the last 72 hours. BNP Invalid input(s): POCBNP D-Dimer No results for input(s): DDIMER in the last 72 hours. Hemoglobin A1C No results for input(s): HGBA1C in the last 72 hours. Fasting Lipid Panel No results for input(s): CHOL, HDL, LDLCALC, TRIG,  CHOLHDL, LDLDIRECT in the last 72 hours. Thyroid Function Tests No results for input(s): TSH, T4TOTAL, T3FREE, THYROIDAB in the last 72 hours.  Invalid input(s): FREET3  TELE  nsr with AV pacing  Radiology/Studies  Dg Chest 2 View  06/27/2015  CLINICAL DATA:  Short of breath.  Pacemaker placement EXAM: CHEST  2 VIEW COMPARISON:  06/25/2015 FINDINGS: Left subclavian dual lead pacemaker in satisfactory position and alignment. T AVR noted Cardiac enlargement without heart failure. Bibasilar atelectasis unchanged. Negative for pulmonary edema. IMPRESSION: Bibasilar atelectasis.  No change from the prior study. Electronically Signed   By: Marlan Palau M.D.   On: 06/27/2015 07:19   Dg Chest 2 View  06/25/2015  CLINICAL DATA:  Status post permanent pacemaker placement EXAM: CHEST  2 VIEW COMPARISON:  Chest x-ray of June 23, 2015 FINDINGS: The patient has undergone permanent pacemaker placement. Radiographic positioning of the  electrodes and of the generator is good. The lungs are adequately inflated. There is no pneumothorax or pleural effusion. The cardiac silhouette remains enlarged. The prosthetic aortic valve cage is visible. The pulmonary vascularity is mildly prominent centrally but stable. The bony thorax exhibits no acute abnormality. IMPRESSION: There is no postprocedure complication following permanent pacemaker placement. There is stable enlargement of the cardiac silhouette without significant pulmonary vascular congestion. Electronically Signed   By: David  [place] M.D.   On: 06/25/2015 07:23   X-ray Chest Pa And Lateral  06/23/2015  CLINICAL DATA:  Increased dizziness for 2 weeks with 1 fall, preoperative pacemaker. Diagnosed with bradycardia today. EXAM: CHEST  2 VIEW COMPARISON:  Chest x-ray dated 03/26/2015. FINDINGS: Cardiomegaly is unchanged. Aortic valve hardware appears stable in position. Central pulmonary vascular congestion is unchanged, probably chronic. More peripheral lungs are clear. No pleural effusion seen. No pneumothorax seen. Mild degenerative change again noted within the thoracic spine. No acute osseous abnormality. IMPRESSION: Fairly marked cardiomegaly, stable. Central pulmonary vascular congestion is unchanged, probably a chronic mild congestive heart failure. No frank pulmonary edema pattern. Lungs otherwise clear. Electronically Signed   By: Bary Richard M.D.   On: 06/23/2015 18:34   Dg Chest Portable 1 View  07/06/2015  CLINICAL DATA:  Chest pain tonight, recent pacemaker insertion. History of hypertension. EXAM: PORTABLE CHEST 1 VIEW COMPARISON:  Chest radiograph June 27, 2015 FINDINGS: Similar cardiomegaly, mediastinal silhouette is nonsuspicious. Calcified aortic knob. Cardiac valve replacement. No pleural effusion or focal consolidation. Bibasilar strandy densities with RIGHT lung base pleural thickening. Dual lead LEFT cardiac pacemaker in situ. No pneumothorax. Osteopenia. RIGHT  shoulder arthroplasty. IMPRESSION: Stable cardiomegaly, bibasilar atelectasis and pleural thickening. Electronically Signed   By: Awilda Metro M.D.   On: 07/06/2015 05:10   Dg Chest Portable 1 View  06/25/2015  CLINICAL DATA:  Center CP, SOB. Pt had a pacemaker placed yesterday and was released from the hospital this AM. Shortly after discharge, pt began experiencing the chest pain and was advised to return to the ED. Hx of HTN, stroke, s/p transcatheter aortic valve replacement. EXAM: PORTABLE CHEST 1 VIEW COMPARISON:  06/25/2015 FINDINGS: Cardiac silhouette is mildly enlarged. Changes cardiac surgery post stable. No mediastinal or hilar masses or evidence of adenopathy. Left anterior chest wall sequential pacemaker is stable in well positioned on this single view. Mild linear lung base subsegmental atelectasis. No pneumonia or edema. No pleural effusion or pneumothorax. Bony thorax is demineralized. There is a partly imaged well positioned right shoulder prosthesis. IMPRESSION: 1. No acute cardiopulmonary disease. Electronically Signed   By: Amie Portland  M.D.   On: 06/25/2015 19:43   Ct Angio Chest Aorta W/cm &/or Wo/cm  07/06/2015  CLINICAL DATA:  Sternal chest pain. Shortness of breath. Chest and abdominal pain. Recent pacemaker placement. EXAM: CT ANGIOGRAPHY CHEST, ABDOMEN AND PELVIS TECHNIQUE: Multidetector CT imaging through the chest, abdomen and pelvis was performed using the standard protocol during bolus administration of intravenous contrast. Multiplanar reconstructed images and MIPs were obtained and reviewed to evaluate the vascular anatomy. CONTRAST:  OMNIPAQUE IOHEXOL 350 MG/ML SOLN COMPARISON:  Chest radiograph earlier this day. CT angiography of the chest abdomen pelvis 02/27/2015 FINDINGS: CTA CHEST FINDINGS Normal caliber thoracic aorta without aneurysm, dissection, acute aortic syndrome or hematoma. Mild atherosclerosis. Post aortic valve replacement. Left-sided pacemaker in  place, leads within the right atrium and ventricle. Small high-density pericardial effusion measuring up to 1.2 cm in depth. No pericardial thickening, enhancement or internal air. Cardiomegaly with coronary artery calcifications. No filling defects in the central pulmonary arteries. No mediastinal or hilar adenopathy. No pneumothorax. Mild motion artifact limits detailed evaluation. Compressive atelectasis in the right middle lobe, linear atelectasis in bilateral lower lobes. No consolidation to suggest pneumonia. No pulmonary mass or evident nodule. Review of the MIP images confirms the above findings. CTA ABDOMEN AND PELVIS FINDINGS Tortuous normal caliber abdominal aorta without aneurysm or dissection. Moderate calcified and noncalcified atheromatous plaque throughout. Patent celiac artery, SMA, and IMA. Patent single bilateral renal arteries. Small flap defect within the right external iliac artery is unchanged from prior exam, better visualized due to phase of contrast bolus timing. Arterial phase imaging of the liver, gallbladder, spleen, pancreas, and adrenal glands are unremarkable. Symmetric renal enhancement without hydronephrosis. Cyst in the interpolar left kidney. Stomach is decompressed. Small hiatal hernia. There are no dilated or thickened bowel loops. Diverticulosis of the distal colon without diverticulitis. Appendix is normal. No free air, free fluid, or intra-abdominal fluid collection. In the pelvis the bladder is physiologically distended. No pelvic free fluid. Fat within both inguinal canals. Review of the osseous structures of the chest abdomen and pelvis demonstrates multilevel degenerative change throughout the thoracic and lumbar spine. There are no acute or suspicious osseous abnormalities. Review of the MIP images confirms the above findings. IMPRESSION: 1. No acute abnormality of the thoracic or abdominal aorta. Mild moderate atherosclerosis without aneurysm or dissection. 2. Small  high-density pericardial effusion, likely sequela of pacemaker placement. This measures 1.2 cm in depth. 3. Scattered atelectasis within both lungs. Chronic findings in the abdomen and pelvis as described. Electronically Signed   By: Rubye Oaks M.D.   On: 07/06/2015 06:13   Ct Cta Abd/pel W/cm &/or W/o Cm  07/06/2015  CLINICAL DATA:  Sternal chest pain. Shortness of breath. Chest and abdominal pain. Recent pacemaker placement. EXAM: CT ANGIOGRAPHY CHEST, ABDOMEN AND PELVIS TECHNIQUE: Multidetector CT imaging through the chest, abdomen and pelvis was performed using the standard protocol during bolus administration of intravenous contrast. Multiplanar reconstructed images and MIPs were obtained and reviewed to evaluate the vascular anatomy. CONTRAST:  OMNIPAQUE IOHEXOL 350 MG/ML SOLN COMPARISON:  Chest radiograph earlier this day. CT angiography of the chest abdomen pelvis 02/27/2015 FINDINGS: CTA CHEST FINDINGS Normal caliber thoracic aorta without aneurysm, dissection, acute aortic syndrome or hematoma. Mild atherosclerosis. Post aortic valve replacement. Left-sided pacemaker in place, leads within the right atrium and ventricle. Small high-density pericardial effusion measuring up to 1.2 cm in depth. No pericardial thickening, enhancement or internal air. Cardiomegaly with coronary artery calcifications. No filling defects in the central pulmonary  arteries. No mediastinal or hilar adenopathy. No pneumothorax. Mild motion artifact limits detailed evaluation. Compressive atelectasis in the right middle lobe, linear atelectasis in bilateral lower lobes. No consolidation to suggest pneumonia. No pulmonary mass or evident nodule. Review of the MIP images confirms the above findings. CTA ABDOMEN AND PELVIS FINDINGS Tortuous normal caliber abdominal aorta without aneurysm or dissection. Moderate calcified and noncalcified atheromatous plaque throughout. Patent celiac artery, SMA, and IMA. Patent single  bilateral renal arteries. Small flap defect within the right external iliac artery is unchanged from prior exam, better visualized due to phase of contrast bolus timing. Arterial phase imaging of the liver, gallbladder, spleen, pancreas, and adrenal glands are unremarkable. Symmetric renal enhancement without hydronephrosis. Cyst in the interpolar left kidney. Stomach is decompressed. Small hiatal hernia. There are no dilated or thickened bowel loops. Diverticulosis of the distal colon without diverticulitis. Appendix is normal. No free air, free fluid, or intra-abdominal fluid collection. In the pelvis the bladder is physiologically distended. No pelvic free fluid. Fat within both inguinal canals. Review of the osseous structures of the chest abdomen and pelvis demonstrates multilevel degenerative change throughout the thoracic and lumbar spine. There are no acute or suspicious osseous abnormalities. Review of the MIP images confirms the above findings. IMPRESSION: 1. No acute abnormality of the thoracic or abdominal aorta. Mild moderate atherosclerosis without aneurysm or dissection. 2. Small high-density pericardial effusion, likely sequela of pacemaker placement. This measures 1.2 cm in depth. 3. Scattered atelectasis within both lungs. Chronic findings in the abdomen and pelvis as described. Electronically Signed   By: Rubye Oaks M.D.   On: 07/06/2015 06:13    ASSESSMENT AND PLAN  1. Pleuritic chest pain 2. CHB, s/p PPM 3. Microperforation, s/p PM lead revision 4. Trivial pericardial effusion Rec: His chest pain has resolved with NSAIDS. He has a trivial effusion. His PPM has been interrogated and working normally. He has no diaphragmatic stimulation. I have reviewed the findings with the patient. Will plan to discharge home. I would like him to go home with daily colchicine in addition to Motrin 600 mg tid for 2 weeks with food, then 400 bid for 2 weeks, then 200 bid for two weeks. I will stop  colchicine when he sees me back in the office in 2 months.   Javione Gunawan,M.D.  07/07/2015 8:45 AM

## 2015-07-07 NOTE — Discharge Instructions (Signed)
Nonspecific Chest Pain  °Chest pain can be caused by many different conditions. There is always a chance that your pain could be related to something serious, such as a heart attack or a blood clot in your lungs. Chest pain can also be caused by conditions that are not life-threatening. If you have chest pain, it is very important to follow up with your health care provider. °CAUSES  °Chest pain can be caused by: °· Heartburn. °· Pneumonia or bronchitis. °· Anxiety or stress. °· Inflammation around your heart (pericarditis) or lung (pleuritis or pleurisy). °· A blood clot in your lung. °· A collapsed lung (pneumothorax). It can develop suddenly on its own (spontaneous pneumothorax) or from trauma to the chest. °· Shingles infection (varicella-zoster virus). °· Heart attack. °· Damage to the bones, muscles, and cartilage that make up your chest wall. This can include: °¨ Bruised bones due to injury. °¨ Strained muscles or cartilage due to frequent or repeated coughing or overwork. °¨ Fracture to one or more ribs. °¨ Sore cartilage due to inflammation (costochondritis). °RISK FACTORS  °Risk factors for chest pain may include: °· Activities that increase your risk for trauma or injury to your chest. °· Respiratory infections or conditions that cause frequent coughing. °· Medical conditions or overeating that can cause heartburn. °· Heart disease or family history of heart disease. °· Conditions or health behaviors that increase your risk of developing a blood clot. °· Having had chicken pox (varicella zoster). °SIGNS AND SYMPTOMS °Chest pain can feel like: °· Burning or tingling on the surface of your chest or deep in your chest. °· Crushing, pressure, aching, or squeezing pain. °· Dull or sharp pain that is worse when you move, cough, or take a deep breath. °· Pain that is also felt in your back, neck, shoulder, or arm, or pain that spreads to any of these areas. °Your chest pain may come and go, or it may stay  constant. °DIAGNOSIS °Lab tests or other studies may be needed to find the cause of your pain. Your health care provider may have you take a test called an ambulatory ECG (electrocardiogram). An ECG records your heartbeat patterns at the time the test is performed. You may also have other tests, such as: °· Transthoracic echocardiogram (TTE). During echocardiography, sound waves are used to create a picture of all of the heart structures and to look at how blood flows through your heart. °· Transesophageal echocardiogram (TEE). This is a more advanced imaging test that obtains images from inside your body. It allows your health care provider to see your heart in finer detail. °· Cardiac monitoring. This allows your health care provider to monitor your heart rate and rhythm in real time. °· Holter monitor. This is a portable device that records your heartbeat and can help to diagnose abnormal heartbeats. It allows your health care provider to track your heart activity for several days, if needed. °· Stress tests. These can be done through exercise or by taking medicine that makes your heart beat more quickly. °· Blood tests. °· Imaging tests. °TREATMENT  °Your treatment depends on what is causing your chest pain. Treatment may include: °· Medicines. These may include: °¨ Acid blockers for heartburn. °¨ Anti-inflammatory medicine. °¨ Pain medicine for inflammatory conditions. °¨ Antibiotic medicine, if an infection is present. °¨ Medicines to dissolve blood clots. °¨ Medicines to treat coronary artery disease. °· Supportive care for conditions that do not require medicines. This may include: °¨ Resting. °¨ Applying heat   or cold packs to injured areas.  Limiting activities until pain decreases. HOME CARE INSTRUCTIONS  If you were prescribed an antibiotic medicine, finish it all even if you start to feel better.  Avoid any activities that bring on chest pain.  Do not use any tobacco products, including  cigarettes, chewing tobacco, or electronic cigarettes. If you need help quitting, ask your health care provider.  Do not drink alcohol.  Take medicines only as directed by your health care provider.  Keep all follow-up visits as directed by your health care provider. This is important. This includes any further testing if your chest pain does not go away.  If heartburn is the cause for your chest pain, you may be told to keep your head raised (elevated) while sleeping. This reduces the chance that acid will go from your stomach into your esophagus.  Make lifestyle changes as directed by your health care provider. These may include:  Getting regular exercise. Ask your health care provider to suggest some activities that are safe for you.  Eating a heart-healthy diet. A registered dietitian can help you to learn healthy eating options.  Maintaining a healthy weight.  Managing diabetes, if necessary.  Reducing stress. SEEK MEDICAL CARE IF:  Your chest pain does not go away after treatment.  You have a rash with blisters on your chest.  You have a fever. SEEK IMMEDIATE MEDICAL CARE IF:   Your chest pain is worse.  You have an increasing cough, or you cough up blood.  You have severe abdominal pain.  You have severe weakness.  You faint.  You have chills.  You have sudden, unexplained chest discomfort.  You have sudden, unexplained discomfort in your arms, back, neck, or jaw.  You have shortness of breath at any time.  You suddenly start to sweat, or your skin gets clammy.  You feel nauseous or you vomit.  You suddenly feel light-headed or dizzy.  Your heart begins to beat quickly, or it feels like it is skipping beats. These symptoms may represent a serious problem that is an emergency. Do not wait to see if the symptoms will go away. Get medical help right away. Call your local emergency services (911 in the [place] Do not drive yourself to the hospital.   This  information is not intended to replace advice given to you by your health care provider. Make sure you discuss any questions you have with your health care provider.   Document Released: 05/12/2005 Document Revised: 08/23/2014 Document Reviewed: 03/08/2014 Elsevier Interactive Patient Education 2016 [place] for pericarditis, take cholchicine daily, take 3 tablets of 200mg  ibuprofen (600mg  total) twice a day for 2 weeks, then decrease to 400mg  twice a day for 2 weeks, then 200mg  twice a day for 2 weeks.

## 2015-07-07 NOTE — Progress Notes (Signed)
Assisted/ observed nursing student pulling and administering medications; capped off IV fluids.

## 2015-07-07 NOTE — Progress Notes (Signed)
Reviewed AVS with patient and his wife for d/c instructions, appointments, medications and answered all questions. IV access and leads removed. Volunteer will assist to exit via w/c with wife and possessions.

## 2015-07-08 ENCOUNTER — Telehealth: Payer: Self-pay | Admitting: Cardiology

## 2015-07-08 NOTE — Telephone Encounter (Signed)
D/C phone call . Appt on 08/05/15 at 11am w/ Dr. Meda Coffee at the [address] office .   Thanks

## 2015-07-08 NOTE — Telephone Encounter (Signed)
D/c phone call . appt 08/05/15 at 11am w/ Dr. Meda Coffee at the [address] office ., Thanks

## 2015-07-08 NOTE — Telephone Encounter (Signed)
Patient contacted regarding discharge from [facility] on 07/07/15.  Patient understands to follow up with provider Dr Meda Coffee on 08/05/15 at 74 am at [address]. Patient understands discharge instructions? yes  Patient understands medications and regiment? yes  Patient understands to bring all medications to this visit? yes

## 2015-07-17 ENCOUNTER — Telehealth: Payer: Self-pay | Admitting: Cardiology

## 2015-07-17 NOTE — Telephone Encounter (Signed)
We can refer him right now

## 2015-07-17 NOTE — Telephone Encounter (Signed)
Pt wants to wait to be referred on his OV date with Dr Meda Coffee on 12/20, because his wife is sick right now.  Pt appreciative for all the help in getting him referred now, but prefers to wait until 12/20.

## 2015-07-17 NOTE — Telephone Encounter (Signed)
New message      Pt has an appt on 12-20.  His legs are weak and he uses a walker.  Are there any exercises he can do to strengthen his legs?  Please call

## 2015-07-17 NOTE — Telephone Encounter (Signed)
Pt calling to ask Dr Meda Coffee if when he comes in to see her on 12/20, can he be referred to cardiac rehab.  Pt states he is feeling so much better since his TAVR and he has been doing simple exercises at home, on his stationary bike.  Pt just wants to maintain his current health status or cardiac function, that's why he's requesting cardiac rehab.  Pt states he cannot start this until after 12/20, for he is currently helping his sick wife out.  Pt states that he just wanted to give Dr Meda Coffee a heads up that he is interested in being referred to cardiac rehab, when he comes in for his 12/20 OV with her.  Informed the pt that this should be no problem at all, and I will notify of this.  Informed the pt that when he comes in for his appt on 12/20, we will fill out his rehab papers and fax them that day.  Pt verbalized understanding and agrees with this plan.  Pt gracious for all the assistance provided.

## 2015-08-04 ENCOUNTER — Other Ambulatory Visit: Payer: Self-pay | Admitting: Family Medicine

## 2015-08-05 ENCOUNTER — Ambulatory Visit (INDEPENDENT_AMBULATORY_CARE_PROVIDER_SITE_OTHER): Payer: Medicare Other | Admitting: Cardiology

## 2015-08-05 ENCOUNTER — Encounter: Payer: Self-pay | Admitting: Cardiology

## 2015-08-05 VITALS — BP 124/56 | HR 60 | Ht 69.0 in | Wt 210.0 lb

## 2015-08-05 DIAGNOSIS — D1801 Hemangioma of skin and subcutaneous tissue: Secondary | ICD-10-CM | POA: Diagnosis not present

## 2015-08-05 DIAGNOSIS — I10 Essential (primary) hypertension: Secondary | ICD-10-CM

## 2015-08-05 DIAGNOSIS — I350 Nonrheumatic aortic (valve) stenosis: Secondary | ICD-10-CM

## 2015-08-05 DIAGNOSIS — Z85828 Personal history of other malignant neoplasm of skin: Secondary | ICD-10-CM | POA: Diagnosis not present

## 2015-08-05 DIAGNOSIS — I309 Acute pericarditis, unspecified: Secondary | ICD-10-CM | POA: Diagnosis not present

## 2015-08-05 DIAGNOSIS — Z952 Presence of prosthetic heart valve: Secondary | ICD-10-CM

## 2015-08-05 DIAGNOSIS — L821 Other seborrheic keratosis: Secondary | ICD-10-CM | POA: Diagnosis not present

## 2015-08-05 DIAGNOSIS — Z954 Presence of other heart-valve replacement: Secondary | ICD-10-CM

## 2015-08-05 DIAGNOSIS — I421 Obstructive hypertrophic cardiomyopathy: Secondary | ICD-10-CM | POA: Diagnosis not present

## 2015-08-05 DIAGNOSIS — Z950 Presence of cardiac pacemaker: Secondary | ICD-10-CM

## 2015-08-05 DIAGNOSIS — L570 Actinic keratosis: Secondary | ICD-10-CM | POA: Diagnosis not present

## 2015-08-05 NOTE — Patient Instructions (Signed)
Your physician recommends that you continue on your current medications as directed. Please refer to the Current Medication list given to you today.    Your physician recommends that you schedule a follow-up appointment in: 2 MONTHS WITH DR NELSON  

## 2015-08-05 NOTE — Progress Notes (Signed)
Patient ID: Robert Anderson, male   DOB: April 02, 1924, 81 y.o.   MRN: [id number]     CARDIOLOGY OFFICE NOTE  Date:  08/05/2015   Robert Anderson Date of Birth: April 30, 1924 Medical Record [id number]  PCP:  Joycelyn Man, MD  Cardiologist:  Meda Coffee    Chief complain: 1 month follow up  History of Present Illness: Robert Anderson is a 81 y.o. male who presents today for a follow up visit.  He has a history of severe AS s/p TAVR 03/25/15, HOCM, LBBB, 1st degree AVB, essential HTN, h/o TIA & CVA.  LHC 02/2015: minor nonobstructive CAD. 2D echo 04/25/15: vigorous LV systolic function with near-cavity obliteration, moderate LVH, EF 65-70%, grade 1 DD, mod LAE. His post op course was complicated by symptomatic 2:1 block, s/p PM placement.  He presented to the ER on 11/21 with chest pain and was diagnosed with and acute pericarditis, started on Colchicine, ibuprofen, symptoms of chest pain are now resolved. The patient is in great spirits, exercises at home 2x/day on a stationary bike, lifts weights, asks about being able to go back to gym. He is walking with a cane as his legs got weak post hospital stay.   Past Medical History  Diagnosis Date  . Essential hypertension   . ERECTILE DYSFUNCTION   . HEARING LOSS   . TIA (transient ischemic attack) 01/21/2014    5 min spell aphasia without assoc sx    . Severe aortic stenosis     a. s/p TAVR 03/2015. Pre-op  LHC 02/2015: minor nonobstructive CAD.  Marland Kitchen Hypertrophic obstructive cardiomyopathy ([place])   . Stroke ([place]) 02/19/2014    Small infarct high posterior left frontal lobe on MRI   . 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 LBBB (left bundle branch block)   . First degree AV block   . Bradycardia     a. HR 30s in clinic 06/2015 - BB discontinued.  Marland Kitchen Heart block     s/p Pacemaker  . DEGENERATIVE JOINT DISEASE, SPINE   . OA (osteoarthritis)     both  hips  . Hyperlipidemia   . Presence of permanent cardiac pacemaker 06/24/2015    St Jude Assurity  . Squamous cell skin cancer     left lower leg  . Prostate cancer [facility]) 2010    "external radiation; 40 treatments"  . Pericardial effusion     a. micorpeforation s/p lead revision 06/26/15  . Atrial fibrillation ([place])     a. dx after lead revision 06/26/15 >> anticoag not started due to recent pericardial effusion in setting of perforation and lead revision    Past Surgical History  Procedure Laterality Date  . Joint replacement    . Cataract extraction w/ intraocular lens  implant, bilateral  2004; 2010    right; left  . Tee without cardioversion N/A 03/07/2015    Procedure: TRANSESOPHAGEAL ECHOCARDIOGRAM (TEE);  Surgeon: Dorothy Spark, MD;  Location: [place];  Service: Cardiovascular;  Laterality: N/A;  . Total shoulder arthroplasty Right 2007    "wore it out playing tennis"  . Tonsillectomy    . Anterior cervical decomp/discectomy fusion  ~ 2000    Dr. Louanne Skye  . Transcatheter aortic valve replacement, transfemoral N/A 03/25/2015    Procedure: TRANSCATHETER AORTIC VALVE REPLACEMENT, TRANSFEMORAL;  Surgeon: Burnell Blanks, MD;  Location: [place];  Service: Open Heart Surgery;  Laterality: N/A;  . Tee without cardioversion N/A 03/25/2015    Procedure: TRANSESOPHAGEAL ECHOCARDIOGRAM (TEE);  Surgeon: Burnell Blanks, MD;  Location: [place];  Service: Open Heart Surgery;  Laterality: N/A;  . Back surgery    . Cardiac catheterization N/A 02/20/2015    Procedure: Right/Left Heart Cath and Coronary Angiography;  Surgeon: Sherren Mocha, MD;  Location: [place] CV LAB;  Service: Cardiovascular;  Laterality: N/A;  . Cardiac valve replacement    . Insert / replace / remove pacemaker  06/24/2015  . Squamous cell carcinoma excision Left X 3    "lower leg"  . Ep implantable device N/A 06/24/2015    Procedure: Pacemaker Implant;  Surgeon: Evans Lance, MD;  Location: [place]  CV LAB;  Service: Cardiovascular; St Jude Assurity   . Ep implantable device N/A 06/26/2015    Procedure: Lead Revision/Repair;  Surgeon: Will Meredith Leeds, MD;  Location: [place] CV LAB;  Service: Cardiovascular;  Laterality: N/A;     Medications: Current Outpatient Prescriptions  Medication Sig Dispense Refill  . acetaminophen (TYLENOL) 325 MG tablet Take 1-2 tablets (325-650 mg total) by mouth every 4 (four) hours as needed for mild pain.    Marland Kitchen clopidogrel (PLAVIX) 75 MG tablet TAKE 1 TABLET (75 MG TOTAL) BY MOUTH DAILY. 90 tablet 3  . colchicine 0.6 MG tablet Take 1 tablet (0.6 mg total) by mouth daily. For a total of 3 weeks, then stop 30 tablet 2  . docusate sodium (COLACE) 100 MG capsule Take 100-300 mg by mouth 2 (two) times daily. 1 tablet in the am, 2 tablets in the pm.    . HYDROcodone-acetaminophen (NORCO/VICODIN) 5-325 MG tablet Take 1 tablet by mouth every 6 (six) hours as needed. 20 tablet 0  . ibuprofen (ADVIL,MOTRIN) 200 MG tablet Take 600mg  (3 of 200mg  pills) twice a day for 2 weeks, decrease to 400mg  twice a day for 2 weeks, then 200mg  twice a day for 2 weeks    . metoprolol tartrate (LOPRESSOR) 25 MG tablet Take 0.5 tablets (12.5 mg total) by mouth 2 (two) times daily.    . pantoprazole (PROTONIX) 40 MG tablet Take 1 tablet (40 mg total) by mouth daily. 60 tablet 0  . pravastatin (PRAVACHOL) 10 MG tablet Take 1 tablet (10 mg total) by mouth at bedtime. 100 tablet 3  . tamsulosin (FLOMAX) 0.4 MG CAPS capsule TAKE 1 CAPSULE (0.4 MG TOTAL) BY MOUTH AT BEDTIME. 90 capsule 1   No current facility-administered medications for this visit.    Allergies: No Known Allergies  Social History: The patient  reports that he has never smoked. He has never used smokeless tobacco. He reports that he does not drink alcohol or use illicit drugs.   Family History: The patient's family history includes CVA in his father and mother; Cancer in his father and sister; Heart attack in his  brother; Heart disease in his brother and mother; Lung cancer in his brother; Prostate cancer in his brother; Stroke in his mother. There is no history of Hypertension.   Review of Systems: Please see the history of present illness.   Otherwise, the review of systems is positive for none.   All other systems are reviewed and negative.   Physical Exam: VS:  BP 124/56 mmHg  Pulse 60  Ht 5\' 9"  (1.753 m)  Wt 210 lb (95.255 kg)  BMI 31.00 kg/m2 .  BMI Body mass index is 31 kg/(m^2).  Wt Readings from Last 3 Encounters:  08/05/15  210 lb (95.255 kg)  07/07/15 210 lb 15.7 oz (95.7 kg)  06/28/15 208 lb 6.4 oz (94.53 kg)    General: Pleasant. Elderly male - looks chronically ill but in no acute distress. Left arm dressing in place.  HEENT: Normal. Neck: Supple, no JVD, carotid bruits, or masses noted.  Cardiac: RRR, 2/6 systolic murmur, no rub, healed scar post PM placement, no discharge Respiratory:  Lungs are clear to auscultation bilaterally with normal work of breathing.  GI: Soft and nontender.  MS: No deformity or atrophy. Gait and ROM intact. Using a walker.  Skin: Warm and dry. Color is normal.  Neuro:  Strength and sensation are intact and no gross focal deficits noted.  Psych: Alert, appropriate and with normal affect.   LABORATORY DATA:  EKG:  EKG is ordered today. This demonstrates complete heart block - rate of 40 - reviewed with Dr. Lovena Le here in the office.  Lab Results  Component Value Date   WBC 6.2 07/07/2015   HGB 12.6* 07/07/2015   HCT 39.7 07/07/2015   PLT 188 07/07/2015   GLUCOSE 120* 07/07/2015   CHOL 158 05/20/2015   TRIG 161.0* 05/20/2015   HDL 45.10 05/20/2015   LDLDIRECT 134.9 05/03/2013   LDLCALC 80 05/20/2015   ALT 12* 07/07/2015   AST 13* 07/07/2015   NA 141 07/07/2015   K 4.3 07/07/2015   CL 109 07/07/2015   CREATININE 1.03 07/07/2015   BUN 17 07/07/2015   CO2 28 07/07/2015   TSH 1.704 06/23/2015   PSA 0.07* 05/20/2015   INR 1.10  06/23/2015   HGBA1C 6.1* 03/21/2015   Other Studies Reviewed Today:  Echo Study Conclusions from 07/06/2015 - Left ventricle: The cavity size was normal. There was moderate asymmetric septal hypertrophy. Systolic function was normal. The estimated ejection fraction was in the range of 60% to 65%. Wall motion was normal; there were no regional wall motion abnormalities. Doppler parameters are consistent with abnormal left ventricular relaxation (grade 1 diastolic dysfunction). - Aortic valve: Bioprosthetic aortic valve s/p TAVR. Trivial peri-valvular aortic insufficiency. There was a mild LV outflow tract gradient. However, this was not fully separated out from the gradient across the aortic valve. I do not think that the gradient across the bioprosthetic aortic valve was significantly elevated. Mean gradient (S): 12 mm Hg. Peak gradient (S): 29 mm Hg. - Mitral valve: There was systolic anterior motion of the anterior leaflet of the mitral valve. Moderately calcified annulus. There was mild to moderate regurgitation. - Left atrium: The atrium was moderately dilated. - Right ventricle: The cavity size was normal. Pacer wire or catheter noted in right ventricle. Systolic function was normal. - Tricuspid valve: Peak RV-RA gradient (S): 29 mm Hg. - Pulmonary arteries: PA peak pressure: 32 mm Hg (S). - Inferior vena cava: The vessel was normal in size. The respirophasic diameter changes were in the normal range (>= 50%), consistent with normal central venous pressure. - Pericardium, extracardiac: A trivial pericardial effusion was identified posterior to the heart.  Impressions:  - Normal LV size with moderate asymmetric septal hypertrophy. EF 60-65%. There was systolic anterior motion of the anterior leaflet of the mitral valve with mild to moderate MR. There was a bioprosthetic aortic valve s/p TAVR with trivial AI that appeared to function  normally. There appeared to be a small LV outflow tract gradient likely related to the [facility], but this was not fully delineated from the gradient across the aortic valve. Overall picture consistent with HOCM.  Assessment/Plan:  1. Severe AS, s/p TAVR, ok to start light cardio exercises and leg strengthening, advised to avoid weight liting 2. HCM - on BB, tolerating well now post PM placement, BP borderline, unable to uptitrate, no dizziness, syncope 3. Symptomatic 2:1 bloct - s/p PM placement, functioning well 4. Acute pericarditis - off colchicine, ibuprofen till 1/1 5. Essential HTN - BP has come up and is stable at present - this is off of metoprolol. May be able to restart once PPM in place.   Follow up in 2 months    Signed: Dorothy Spark, MD  08/05/2015 11:52 AM  [facility] [address] [place], [place]  [address] Phone: [phone] Fax: [phone]

## 2015-09-01 ENCOUNTER — Ambulatory Visit: Payer: Medicare Other | Admitting: Neurology

## 2015-09-25 ENCOUNTER — Encounter: Payer: Self-pay | Admitting: Internal Medicine

## 2015-09-25 ENCOUNTER — Ambulatory Visit (INDEPENDENT_AMBULATORY_CARE_PROVIDER_SITE_OTHER): Payer: Medicare Other | Admitting: Internal Medicine

## 2015-09-25 VITALS — BP 126/84 | HR 62 | Ht 69.0 in | Wt 213.8 lb

## 2015-09-25 DIAGNOSIS — I442 Atrioventricular block, complete: Secondary | ICD-10-CM

## 2015-09-25 LAB — CUP PACEART INCLINIC DEVICE CHECK
Battery Voltage: 2.98 V
Brady Statistic RV Percent Paced: 98 %
Date Time Interrogation Session: 20170209140646
Implantable Lead Implant Date: 20161108
Implantable Lead Location: 753860
Lead Channel Impedance Value: 487.5 Ohm
Lead Channel Pacing Threshold Amplitude: 0.5 V
Lead Channel Pacing Threshold Amplitude: 0.75 V
Lead Channel Pacing Threshold Pulse Width: 0.5 ms
Lead Channel Setting Pacing Pulse Width: 0.5 ms
Lead Channel Setting Sensing Sensitivity: 5 mV
MDC IDC LEAD IMPLANT DT: 20161108
MDC IDC LEAD LOCATION: 753859
MDC IDC MSMT BATTERY REMAINING LONGEVITY: 115.2
MDC IDC MSMT LEADCHNL RA IMPEDANCE VALUE: 400 Ohm
MDC IDC MSMT LEADCHNL RA PACING THRESHOLD AMPLITUDE: 0.75 V
MDC IDC MSMT LEADCHNL RA PACING THRESHOLD PULSEWIDTH: 0.5 ms
MDC IDC MSMT LEADCHNL RA SENSING INTR AMPL: 5 mV
MDC IDC MSMT LEADCHNL RV PACING THRESHOLD PULSEWIDTH: 0.5 ms
MDC IDC MSMT LEADCHNL RV SENSING INTR AMPL: 12 mV
MDC IDC SET LEADCHNL RA PACING AMPLITUDE: 2 V
MDC IDC SET LEADCHNL RV PACING AMPLITUDE: 0.75 V
MDC IDC STAT BRADY RA PERCENT PACED: 54 %
Pulse Gen Model: 2240
Pulse Gen Serial Number: 7825693

## 2015-09-25 NOTE — Progress Notes (Signed)
HPI [name] returns today for PPM followup. He is a pleasant 82 yo man with aortic stenosis, s/p TAVR, HTN, complete heart block, s/p PPM insertion with lead dislodgement and re-insertion. The patient developed pericarditis several weeks ago. His symptoms have resolved. He denies chest pain or sob. No edema. No syncope. No Known Allergies   Current Outpatient Prescriptions  Medication Sig Dispense Refill  . acetaminophen (TYLENOL) 325 MG tablet Take 1-2 tablets (325-650 mg total) by mouth every 4 (four) hours as needed for mild pain.    Marland Kitchen clopidogrel (PLAVIX) 75 MG tablet TAKE 1 TABLET (75 MG TOTAL) BY MOUTH DAILY. 90 tablet 3  . docusate sodium (COLACE) 100 MG capsule Take 200 mg by mouth every morning.     . metoprolol tartrate (LOPRESSOR) 25 MG tablet Take 0.5 tablets (12.5 mg total) by mouth 2 (two) times daily.    . pravastatin (PRAVACHOL) 10 MG tablet Take 1 tablet (10 mg total) by mouth at bedtime. 100 tablet 3  . tamsulosin (FLOMAX) 0.4 MG CAPS capsule TAKE 1 CAPSULE (0.4 MG TOTAL) BY MOUTH AT BEDTIME. 90 capsule 1   No current facility-administered medications for this visit.     Past Medical History  Diagnosis Date  . Essential hypertension   . ERECTILE DYSFUNCTION   . HEARING LOSS   . TIA (transient ischemic attack) 01/21/2014    5 min spell aphasia without assoc sx    . Severe aortic stenosis     a. s/p TAVR 03/2015. Pre-op  LHC 02/2015: minor nonobstructive CAD.  Marland Kitchen Hypertrophic obstructive cardiomyopathy ([place])   . Stroke ([place]) 02/19/2014    Small infarct high posterior left frontal lobe on MRI   . 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 LBBB (left bundle branch block)   . First degree AV block   . Bradycardia     a. HR 30s in clinic 06/2015 - BB discontinued.  Marland Kitchen Heart block     s/p Pacemaker  . DEGENERATIVE JOINT DISEASE, SPINE   . OA  (osteoarthritis)     both hips  . Hyperlipidemia   . Presence of permanent cardiac pacemaker 06/24/2015    St Jude Assurity  . Squamous cell skin cancer     left lower leg  . Prostate cancer [facility]) 2010    "external radiation; 40 treatments"  . Pericardial effusion     a. micorpeforation s/p lead revision 06/26/15  . Atrial fibrillation ([place])     a. dx after lead revision 06/26/15 >> anticoag not started due to recent pericardial effusion in setting of perforation and lead revision    ROS:   All systems reviewed and negative except as noted in the HPI.   Past Surgical History  Procedure Laterality Date  . Joint replacement    . Cataract extraction w/ intraocular lens  implant, bilateral  2004; 2010    right; left  . Tee without cardioversion N/A 03/07/2015    Procedure: TRANSESOPHAGEAL ECHOCARDIOGRAM (TEE);  Surgeon: Dorothy Spark, MD;  Location: [place];  Service: Cardiovascular;  Laterality: N/A;  . Total shoulder arthroplasty Right 2007    "wore it out playing tennis"  . Tonsillectomy    . Anterior cervical decomp/discectomy fusion  ~ 2000    Dr. Louanne Skye  . Transcatheter aortic valve replacement, transfemoral N/A 03/25/2015    Procedure: TRANSCATHETER AORTIC VALVE REPLACEMENT, TRANSFEMORAL;  Surgeon: Burnell Blanks, MD;  Location: [place];  Service: Open Heart Surgery;  Laterality: N/A;  . Tee without cardioversion N/A 03/25/2015    Procedure: TRANSESOPHAGEAL ECHOCARDIOGRAM (TEE);  Surgeon: Burnell Blanks, MD;  Location: [place];  Service: Open Heart Surgery;  Laterality: N/A;  . Back surgery    . Cardiac catheterization N/A 02/20/2015    Procedure: Right/Left Heart Cath and Coronary Angiography;  Surgeon: Sherren Mocha, MD;  Location: [place] CV LAB;  Service: Cardiovascular;  Laterality: N/A;  . Cardiac valve replacement    . Insert / replace / remove pacemaker  06/24/2015  . Squamous cell carcinoma excision Left X 3    "lower leg"  . Ep implantable  device N/A 06/24/2015    Procedure: Pacemaker Implant;  Surgeon: Evans Lance, MD;  Location: [place] CV LAB;  Service: Cardiovascular; St Jude Assurity   . Ep implantable device N/A 06/26/2015    Procedure: Lead Revision/Repair;  Surgeon: Will Meredith Leeds, MD;  Location: [place] CV LAB;  Service: Cardiovascular;  Laterality: N/A;     Family History  Problem Relation Age of Onset  . Heart disease Mother   . Cancer Father   . Cancer Sister   . Prostate cancer Brother   . Heart disease Brother   . Lung cancer Brother   . CVA Mother   . CVA Father   . Heart attack Brother   . Hypertension Neg Hx   . Stroke Mother      Social History   Social History  . Marital Status: Married    Spouse Name: N/A  . Number of Children: N/A  . Years of Education: N/A   Occupational History  . Retired    Social History Main Topics  . Smoking status: Never Smoker   . Smokeless tobacco: Never Used  . Alcohol Use: No  . Drug Use: No  . Sexual Activity: Not Currently   Other Topics Concern  . Not on file   Social History Narrative   Patient lives in [place] with his wife.     BP 126/84 mmHg  Pulse 62  Ht 5\' 9"  (1.753 m)  Wt 213 lb 12.8 oz (96.979 kg)  BMI 31.56 kg/m2  Physical Exam:  Well appearing elderly man, NAD HEENT: Unremarkable Neck:  6 cm JVD, no thyromegally Lymphatics:  No adenopathy Back:  No CVA tenderness Lungs:  Clear with no wheezes HEART:  Regular rate rhythm, no murmurs, no rubs, no clicks Abd:  soft, positive bowel sounds, no organomegally, no rebound, no guarding Ext:  2 plus pulses, no edema, no cyanosis, no clubbing Skin:  No rashes no nodules Neuro:  CN II through XII intact, motor grossly intact  EKG - nsr with AV pacing  DEVICE  Normal device function.  See PaceArt for details.   A/P 1. Complete heart block - he is doing well, s/p PPM insertion 2. PPM - his St. Jude DDD PM is working normally. Will recheck in several months. 3.  HTN - his blood pressure is stable.  4. Chest pain - this is resolved. He will continue his current meds.  Assess/Plan:

## 2015-09-25 NOTE — Patient Instructions (Addendum)
Medication Instructions:  Your physician recommends that you continue on your current medications as directed. Please refer to the Current Medication list given to you today.   Labwork: None ordered   Testing/Procedures: None ordered   Follow-Up: Your physician wants you to follow-up in: 9  months with Dr Knox Saliva will receive a reminder letter in the mail two months in advance. If you don't receive a letter, please call our office to schedule the follow-up appointment.  Remote monitoring is used to monitor your Pacemaker  from home. This monitoring reduces the number of office visits required to check your device to one time per year. It allows [place] to keep an eye on the functioning of your device to ensure it is working properly. You are scheduled for a device check from home on 12/25/15. You may send your transmission at any time that day. If you have a wireless device, the transmission will be sent automatically. After your physician reviews your transmission, you will receive a postcard with your next transmission date.     Any Other Special Instructions Will Be Listed Below (If Applicable).     If you need a refill on your cardiac medications before your next appointment, please call your pharmacy.

## 2015-09-26 ENCOUNTER — Ambulatory Visit: Payer: Medicare Other | Admitting: Cardiology

## 2015-09-26 ENCOUNTER — Ambulatory Visit: Payer: Medicare Other | Admitting: Neurology

## 2015-10-02 ENCOUNTER — Encounter: Payer: Self-pay | Admitting: Neurology

## 2015-10-02 ENCOUNTER — Ambulatory Visit (INDEPENDENT_AMBULATORY_CARE_PROVIDER_SITE_OTHER): Payer: Medicare Other | Admitting: Neurology

## 2015-10-02 VITALS — BP 118/70 | HR 86 | Wt 215.0 lb

## 2015-10-02 DIAGNOSIS — I639 Cerebral infarction, unspecified: Secondary | ICD-10-CM | POA: Diagnosis not present

## 2015-10-02 NOTE — Patient Instructions (Signed)
1. Continue all your current medications 2. Continue physical exercise and brain stimulation exercises 3. Follow-up in 1 year, call for any changes

## 2015-10-02 NOTE — Progress Notes (Signed)
NEUROLOGY FOLLOW UP OFFICE NOTE  [name] [id number]  HISTORY OF PRESENT ILLNESS: I had the pleasure of seeing Robert Anderson in follow-up in the neurology clinic on 10/02/2015.  The patient was last seen 1 year ago after an episode of transient aphasia last 01/19/14. His MRI brain had shown a small cortical focus of FLAIR signal in the high posterior left frontal lobe suggestive of late subacute infarct. Stroke workup unremarkable except for moderately dilated left atrium. Since his last visit, he was noted to be in complete heart block during one of his Cardiology visits and underwent pacemaker placement in November 2016. He was admitted for pericarditis a few weeks after. He has been doing well since then, and denies any symptoms. He is taking Plavix without side effects. From a neurological standpoint, he continues to do very well with no further stroke-like symptoms, no word-finding difficulties. He exercises regularly at the [facility]. No falls. He denies any headaches, diplopia, dysarthria, focal numbness/tingling/weakness. BP today 118/70. His last lipid panel showed an LDL of 80.   HPI: This is a very pleasant 82 yo RH man with a history of hypertension, in his usual state of health until 01/19/2014 they were getting ready to eat and went into the car. His wife was in the backseat asking him if he had the house keys and he could not answer her, stating that "the words wouldn't come out." He tried to reach his arm out to stop her but could not say a single word, then 5 minutes later he was able to talk. He denied any associated headache, focal numbness/tingling/weakness, or dizziness. His wife reported that his demeanor appeared like he was thinking, she thought he didn't hear her but appeared to be trying to say something but couldn't say it. She asked him to stick his tongue out which he was able to do, then he started talking.   Diagnostic Data: I personally reviewed MRI brain without contrast  done 10 days after event which showed a small cortical focus of FLAIR signal in the high posterior left frontal lobe suggestive of late subacute infarct. MRA brain unremarkable. Carotid dopplers showed heterogenous plaque bilaterally with no significant stenosis. Echocardiogram showed severe concentric LVH, hyperdynamic LVEF, moderate to severe aortic stenosis. Left atrium was moderately dilated. He was evaluated by cardiology, diuretics discontinued and started on Metoprolol.HbA1c 6.0, lipid panel showed LDL 85. Triglycerides were elevated at 321, however he was not fasting prior to the test.  PAST MEDICAL HISTORY: Past Medical History  Diagnosis Date  . Essential hypertension   . ERECTILE DYSFUNCTION   . HEARING LOSS   . TIA (transient ischemic attack) 01/21/2014    5 min spell aphasia without assoc sx    . Severe aortic stenosis     a. s/p TAVR 03/2015. Pre-op  LHC 02/2015: minor nonobstructive CAD.  Marland Kitchen Hypertrophic obstructive cardiomyopathy ([place])   . Stroke ([place]) 02/19/2014    Small infarct high posterior left frontal lobe on MRI   . 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 LBBB (left bundle branch block)   . First degree AV block   . Bradycardia     a. HR 30s in clinic 06/2015 - BB discontinued.  Marland Kitchen Heart block     s/p Pacemaker  . DEGENERATIVE JOINT DISEASE, SPINE   . OA (osteoarthritis)     both hips  .  Hyperlipidemia   . Presence of permanent cardiac pacemaker 06/24/2015    St Jude Assurity  . Squamous cell skin cancer     left lower leg  . Prostate cancer [facility]) 2010    "external radiation; 40 treatments"  . Pericardial effusion     a. micorpeforation s/p lead revision 06/26/15  . Atrial fibrillation ([place])     a. dx after lead revision 06/26/15 >> anticoag not started due to recent pericardial effusion in setting of perforation and lead revision     MEDICATIONS: Current Outpatient Prescriptions on File Prior to Visit  Medication Sig Dispense Refill  . acetaminophen (TYLENOL) 325 MG tablet Take 1-2 tablets (325-650 mg total) by mouth every 4 (four) hours as needed for mild pain.    Marland Kitchen clopidogrel (PLAVIX) 75 MG tablet TAKE 1 TABLET (75 MG TOTAL) BY MOUTH DAILY. 90 tablet 3  . docusate sodium (COLACE) 100 MG capsule Take 200 mg by mouth every morning.     . metoprolol tartrate (LOPRESSOR) 25 MG tablet Take 0.5 tablets (12.5 mg total) by mouth 2 (two) times daily.    . pravastatin (PRAVACHOL) 10 MG tablet Take 1 tablet (10 mg total) by mouth at bedtime. 100 tablet 3  . tamsulosin (FLOMAX) 0.4 MG CAPS capsule TAKE 1 CAPSULE (0.4 MG TOTAL) BY MOUTH AT BEDTIME. 90 capsule 1   No current facility-administered medications on file prior to visit.    ALLERGIES: No Known Allergies  FAMILY HISTORY: Family History  Problem Relation Age of Onset  . Heart disease Mother   . Cancer Father   . Cancer Sister   . Prostate cancer Brother   . Heart disease Brother   . Lung cancer Brother   . CVA Mother   . CVA Father   . Heart attack Brother   . Hypertension Neg Hx   . Stroke Mother     SOCIAL HISTORY: Social History   Social History  . Marital Status: Married    Spouse Name: N/A  . Number of Children: N/A  . Years of Education: N/A   Occupational History  . Retired    Social History Main Topics  . Smoking status: Never Smoker   . Smokeless tobacco: Never Used  . Alcohol Use: No  . Drug Use: No  . Sexual Activity: Not Currently   Other Topics Concern  . Not on file   Social History Narrative   Patient lives in [place] with his wife.    REVIEW OF SYSTEMS: Constitutional: No fevers, chills, or sweats, no generalized fatigue, change in appetite Eyes: No visual changes, double vision, eye pain Ear, nose and throat: No hearing loss, ear pain, nasal congestion, sore throat Cardiovascular: No chest pain,  palpitations Respiratory:  No shortness of breath at rest or with exertion, wheezes GastrointestinaI: No nausea, vomiting, diarrhea, abdominal pain, fecal incontinence Genitourinary:  No dysuria, urinary retention or frequency Musculoskeletal:  No neck pain, back pain Integumentary: No rash, pruritus, skin lesions Neurological: as above Psychiatric: No depression, insomnia, anxiety Endocrine: No palpitations, fatigue, diaphoresis, mood swings, change in appetite, change in weight, increased thirst Hematologic/Lymphatic:  No anemia, purpura, petechiae. Allergic/Immunologic: no itchy/runny eyes, nasal congestion, recent allergic reactions, rashes  PHYSICAL EXAM: Filed Vitals:   10/02/15 1557  BP: 118/70  Pulse: 86   General: No acute distress Head:  Normocephalic/atraumatic Neck: supple, no paraspinal tenderness, full range of motion Heart:  Regular rate and rhythm Lungs:  Clear to auscultation bilaterally Back: No paraspinal tenderness Skin/Extremities: No rash,  no edema Neurological Exam: alert and oriented to person, place, and time. No aphasia or dysarthria. Fund of knowledge is appropriate.  Remote memory intact. 1/3 delayed recall, needed context clues for 2 words.  Attention and concentration are normal.    Able to name objects and repeat phrases. Cranial nerves: Pupils equal, round, reactive to light.  Fundoscopic exam unremarkable, no papilledema. Extraocular movements intact with no nystagmus. Visual fields full. Facial sensation intact. No facial asymmetry. Tongue, uvula, palate midline.  Motor: Bulk and tone normal, muscle strength 5/5 throughout with no pronator drift.  Sensation to light touch intact.  No extinction to double simultaneous stimulation.  Deep tendon reflexes 2+ throughout, toes downgoing.  Finger to nose testing intact.  Gait slow and cautious, appears to favor left leg, unable to tandem walk.  Romberg negative.  IMPRESSION: This is a pleasant 82 yo RH man who  presented with a transient 5-minute episode of inability to speak last June 2015. MRI brain showed a small cortical infarct in the left posterior frontal region. MRA and carotid dopplers did not show significant obstruction, echo shows moderate aortic stenosis and dilated left atrium. He underwent pacemaker placement in November 2016 for complete heart block. He continues to do well from a neurological standpoint with no further stroke-like symptoms. Continue control of vascular risk factors, BP today 118/70, continue to keep LDL <100. Continue exercise. He knows to go to ER immediately if symptoms recur. He will follow-up in 1 year or earlier if needed.  Thank you for allowing me to participate in his care.  Please do not hesitate to call for any questions or concerns.  The duration of this appointment visit was 15 minutes of face-to-face time with the patient.  Greater than 50% of this time was spent in counseling, explanation of diagnosis, planning of further management, and coordination of care.   Ellouise Newer, M.D.   CC: Dr. Sherren Mocha

## 2015-10-17 DIAGNOSIS — M25511 Pain in right shoulder: Secondary | ICD-10-CM | POA: Diagnosis not present

## 2015-10-17 DIAGNOSIS — Z96611 Presence of right artificial shoulder joint: Secondary | ICD-10-CM | POA: Diagnosis not present

## 2015-10-17 DIAGNOSIS — M545 Low back pain: Secondary | ICD-10-CM | POA: Diagnosis not present

## 2015-10-20 ENCOUNTER — Encounter: Payer: Self-pay | Admitting: Cardiology

## 2015-10-20 ENCOUNTER — Ambulatory Visit (INDEPENDENT_AMBULATORY_CARE_PROVIDER_SITE_OTHER): Payer: Medicare Other | Admitting: Cardiology

## 2015-10-20 VITALS — BP 120/60 | HR 64 | Ht 69.0 in | Wt 215.0 lb

## 2015-10-20 DIAGNOSIS — I350 Nonrheumatic aortic (valve) stenosis: Secondary | ICD-10-CM | POA: Diagnosis not present

## 2015-10-20 DIAGNOSIS — I422 Other hypertrophic cardiomyopathy: Secondary | ICD-10-CM

## 2015-10-20 DIAGNOSIS — Z954 Presence of other heart-valve replacement: Secondary | ICD-10-CM | POA: Diagnosis not present

## 2015-10-20 DIAGNOSIS — I10 Essential (primary) hypertension: Secondary | ICD-10-CM | POA: Diagnosis not present

## 2015-10-20 DIAGNOSIS — M791 Myalgia, unspecified site: Secondary | ICD-10-CM

## 2015-10-20 DIAGNOSIS — E785 Hyperlipidemia, unspecified: Secondary | ICD-10-CM

## 2015-10-20 DIAGNOSIS — Z952 Presence of prosthetic heart valve: Secondary | ICD-10-CM

## 2015-10-20 NOTE — Progress Notes (Signed)
Patient ID: Robert Anderson, male   DOB: 1924-04-23, 82 y.o.   MRN: [id number]    CARDIOLOGY OFFICE NOTE  Date:  10/20/2015   Robert Anderson Date of Birth: 01-30-1924 Medical Record [id number]  PCP:  Robert Man, MD  Cardiologist:  Robert Anderson    Chief complain: 3 month follow up  History of Present Illness: Robert Anderson is a 82 y.o. male who presents today for a follow up visit.  He has a history of severe AS s/p TAVR 03/25/15, HOCM, LBBB, 1st degree AVB, essential HTN, h/o TIA & CVA.  LHC 02/2015: minor nonobstructive CAD. 2D echo 04/25/15: vigorous LV systolic function with near-cavity obliteration, moderate LVH, EF 65-70%, grade 1 DD, mod LAE. His post op course was complicated by symptomatic 2:1 block, s/p PM placement.  He presented to the ER on 11/21 with chest pain and was diagnosed with and acute pericarditis, started on Colchicine, ibuprofen, symptoms of chest pain are now resolved. He is off all medicines. He saw Robert Anderson in December for follow-up for pacemaker placement and repositioning of the lead that Dislodged, the pacemakers working properly.  Today he comes after 3 months he is in great spirits, he denies any chest pain, shortness of breath. He has been experiencing shoulder and back pain, for which she takes physical therapy. He is asking advised about proper type of denies palpitations or syncope. He is compliant with his medicines. He is complaining of weakness in his legs and slight muscle pain. He was started on pravastatin several months ago by his primary care physician.  Past Medical History  Diagnosis Date  . Essential hypertension   . ERECTILE DYSFUNCTION   . HEARING LOSS   . TIA (transient ischemic attack) 01/21/2014    5 min spell aphasia without assoc sx    . Severe aortic stenosis     a. s/p TAVR 03/2015. Pre-op  LHC 02/2015: minor nonobstructive CAD.  Marland Kitchen Hypertrophic obstructive cardiomyopathy ([place])   . Stroke ([place]) 02/19/2014    Small infarct high posterior left frontal  lobe on MRI   . 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 LBBB (left bundle branch block)   . First degree AV block   . Bradycardia     a. HR 30s in clinic 06/2015 - BB discontinued.  Marland Kitchen Heart block     s/p Pacemaker  . DEGENERATIVE JOINT DISEASE, SPINE   . OA (osteoarthritis)     both hips  . Hyperlipidemia   . Presence of permanent cardiac pacemaker 06/24/2015    St Jude Assurity  . Squamous cell skin cancer     left lower leg  . Prostate cancer [facility]) 2010    "external radiation; 40 treatments"  . Pericardial effusion     a. micorpeforation s/p lead revision 06/26/15  . Atrial fibrillation ([place])     a. dx after lead revision 06/26/15 >> anticoag not started due to recent pericardial effusion in setting of perforation and lead revision   Past Surgical History  Procedure Laterality Date  . Joint replacement    . Cataract extraction w/ intraocular lens  implant, bilateral  2004; 2010    right; left  . Tee without cardioversion N/A 03/07/2015    Procedure: TRANSESOPHAGEAL ECHOCARDIOGRAM (TEE);  Surgeon: Robert Spark, MD;  Location: [facility];  Service: Cardiovascular;  Laterality: N/A;  . Total shoulder arthroplasty Right  2007    "wore it out playing tennis"  . Tonsillectomy    . Anterior cervical decomp/discectomy fusion  ~ 2000    Dr. Louanne Anderson  . Transcatheter aortic valve replacement, transfemoral N/A 03/25/2015    Procedure: TRANSCATHETER AORTIC VALVE REPLACEMENT, TRANSFEMORAL;  Surgeon: Robert Blanks, MD;  Location: [place];  Service: Open Heart Surgery;  Laterality: N/A;  . Tee without cardioversion N/A 03/25/2015    Procedure: TRANSESOPHAGEAL ECHOCARDIOGRAM (TEE);  Surgeon: Robert Blanks, MD;  Location: [place];  Service: Open Heart Surgery;  Laterality: N/A;  . Back surgery    . Cardiac catheterization N/A 02/20/2015    Procedure: Right/Left  Heart Cath and Coronary Angiography;  Surgeon: Robert Mocha, MD;  Location: [facility];  Service: Cardiovascular;  Laterality: N/A;  . Cardiac valve replacement    . Insert / replace / remove pacemaker  06/24/2015  . Squamous cell carcinoma excision Left X 3    "lower leg"  . Ep implantable device N/A 06/24/2015    Procedure: Pacemaker Implant;  Surgeon: Robert Lance, MD;  Location: [facility];  Service: Cardiovascular; St Jude Assurity   . Ep implantable device N/A 06/26/2015    Procedure: Lead Revision/Repair;  Surgeon: Robert Meredith Leeds, MD;  Location: [facility];  Service: Cardiovascular;  Laterality: N/A;   Medications: Current Outpatient Prescriptions  Medication Sig Dispense Refill  . acetaminophen (TYLENOL) 325 MG tablet Take 1-2 tablets (325-650 mg total) by mouth every 4 (four) hours as needed for mild pain.    Marland Kitchen clopidogrel (PLAVIX) 75 MG tablet TAKE 1 TABLET (75 MG TOTAL) BY MOUTH DAILY. 90 tablet 3  . docusate sodium (COLACE) 100 MG capsule Take 200 mg by mouth every morning.     . metoprolol tartrate (LOPRESSOR) 25 MG tablet Take 0.5 tablets (12.5 mg total) by mouth 2 (two) times daily.    . pravastatin (PRAVACHOL) 10 MG tablet Take 1 tablet (10 mg total) by mouth at bedtime. 100 tablet 3  . tamsulosin (FLOMAX) 0.4 MG CAPS capsule TAKE 1 CAPSULE (0.4 MG TOTAL) BY MOUTH AT BEDTIME. 90 capsule 1   No current facility-administered medications for this visit.   Allergies: No Known Allergies  Social History: The patient  reports that he has never smoked. He has never used smokeless tobacco. He reports that he does not drink alcohol or use illicit drugs.   Family History: The patient's family history includes CVA in his father and mother; Cancer in his father and sister; Heart attack in his brother; Heart disease in his brother and mother; Lung cancer in his brother; Prostate cancer in his brother; Stroke in his mother. There is no history of  Hypertension.   Review of Systems: Please see the history of present illness.   Otherwise, the review of systems is positive for none.   All other systems are reviewed and negative.   Physical Exam: VS:  BP 120/60 mmHg  Pulse 64  Ht 5\' 9"  (1.753 m)  Wt 215 lb (97.523 kg)  BMI 31.74 kg/m2 .  BMI Body mass index is 31.74 kg/(m^2).  Wt Readings from Last 3 Encounters:  10/20/15 215 lb (97.523 kg)  10/02/15 215 lb (97.523 kg)  09/25/15 213 lb 12.8 oz (96.979 kg)   General: Pleasant. Elderly male - looks chronically ill but in no acute distress. Left arm dressing in place.  HEENT: Normal. Neck: Supple, no JVD, carotid bruits, or masses noted.  Cardiac: RRR, no murmur, no  rub, healed scar post PM placement, no discharge Respiratory:  Lungs are clear to auscultation bilaterally with normal work of breathing.  GI: Soft and nontender.  MS: No deformity or atrophy. Gait and ROM intact. Using a walker.  Skin: Warm and dry. Color is normal.  Neuro:  Strength and sensation are intact and no gross focal deficits noted.  Psych: Alert, appropriate and with normal affect.  LABORATORY DATA:  EKG:  EKG is ordered today. This demonstrates complete heart block - rate of 40 - reviewed with Robert Anderson here in the office.  Lab Results  Component Value Date   WBC 6.2 07/07/2015   HGB 12.6* 07/07/2015   HCT 39.7 07/07/2015   PLT 188 07/07/2015   GLUCOSE 120* 07/07/2015   CHOL 158 05/20/2015   TRIG 161.0* 05/20/2015   HDL 45.10 05/20/2015   LDLDIRECT 134.9 05/03/2013   LDLCALC 80 05/20/2015   ALT 12* 07/07/2015   AST 13* 07/07/2015   NA 141 07/07/2015   K 4.3 07/07/2015   CL 109 07/07/2015   CREATININE 1.03 07/07/2015   BUN 17 07/07/2015   CO2 28 07/07/2015   TSH 1.704 06/23/2015   PSA 0.07* 05/20/2015   INR 1.10 06/23/2015   HGBA1C 6.1* 03/21/2015   Other Studies Reviewed Today:  Echo Study Conclusions from 07/06/2015 - Left ventricle: The cavity size was normal. There was  moderate asymmetric septal hypertrophy. Systolic function was normal. The estimated ejection fraction was in the range of 60% to 65%. Wall motion was normal; there were no regional wall motion abnormalities. Doppler parameters are consistent with abnormal left ventricular relaxation (grade 1 diastolic dysfunction). - Aortic valve: Bioprosthetic aortic valve s/p TAVR. Trivial peri-valvular aortic insufficiency. There was a mild LV outflow tract gradient. However, this was not fully separated out from the gradient across the aortic valve. I do not think that the gradient across the bioprosthetic aortic valve was significantly elevated. Mean gradient (S): 12 mm Hg. Peak gradient (S): 29 mm Hg. - Mitral valve: There was systolic anterior motion of the anterior leaflet of the mitral valve. Moderately calcified annulus. There was mild to moderate regurgitation. - Left atrium: The atrium was moderately dilated. - Right ventricle: The cavity size was normal. Pacer wire or catheter noted in right ventricle. Systolic function was normal. - Tricuspid valve: Peak RV-RA gradient (S): 29 mm Hg. - Pulmonary arteries: PA peak pressure: 32 mm Hg (S). - Inferior vena cava: The vessel was normal in size. The respirophasic diameter changes were in the normal range (>= 50%), consistent with normal central venous pressure. - Pericardium, extracardiac: A trivial pericardial effusion was identified posterior to the heart.  Impressions:  - Normal LV size with moderate asymmetric septal hypertrophy. EF 60-65%. There was systolic anterior motion of the anterior leaflet of the mitral valve with mild to moderate MR. There was a bioprosthetic aortic valve s/p TAVR with trivial AI that appeared to function normally. There appeared to be a small LV outflow tract gradient likely related to the [facility], but this was not fully delineated from the gradient across the aortic  valve. Overall picture consistent with HOCM.   Assessment/Plan:  1. Severe AS, s/p TAVR, he is doing great, he is advised to do light cardio exercises about 15-20 minutes a day. Amyloid repeated arm lifting and isometric exercises because of history of repositioning of his pacemaker lead. 2. HCM - on BB, tolerating well now post PM placement, BP borderline, unable to uptitrate, no dizziness, syncope 3. Symptomatic  2:1 bloct - s/p PM placement, functioning well 4. Acute pericarditis - off colchicine, ibuprofen, asymptomatic 5. Essential HTN - controlled 6. Muscle weakness and pain - discontinue pravastatin, start fish oil only, his LDL was well controlled triglyceride mildly elevated, however patient is 89 and on last year cardiac He had only minimal nonobstructive CAD.  Follow up in 4 months  Signed: Dorothy Spark, MD  10/20/2015 11:10 AM  [facility] [address] [place], [place]  [address] Phone: [phone] Fax: [phone]

## 2015-10-20 NOTE — Patient Instructions (Signed)
Medication Instructions:   STOP TAKING PRAVASTATIN NOW    Follow-Up:  4 MONTHS WITH DR Meda Coffee     If you need a refill on your cardiac medications before your next appointment, please call your pharmacy.

## 2015-10-28 DIAGNOSIS — M545 Low back pain: Secondary | ICD-10-CM | POA: Diagnosis not present

## 2015-12-09 ENCOUNTER — Telehealth: Payer: Self-pay | Admitting: *Deleted

## 2015-12-09 ENCOUNTER — Other Ambulatory Visit: Payer: Self-pay | Admitting: *Deleted

## 2015-12-09 DIAGNOSIS — I350 Nonrheumatic aortic (valve) stenosis: Secondary | ICD-10-CM

## 2015-12-09 NOTE — Telephone Encounter (Signed)
Pt had TAVR on 03/25/15.  I placed call to pt to schedule one year follow up with Dr. Angelena Form and echo. Left message to call back

## 2015-12-09 NOTE — Telephone Encounter (Signed)
Spoke with pt and appointments made for echo and to see Dr. Angelena Form on August 24,2017

## 2015-12-25 ENCOUNTER — Ambulatory Visit (INDEPENDENT_AMBULATORY_CARE_PROVIDER_SITE_OTHER): Payer: Medicare Other | Admitting: *Deleted

## 2015-12-25 DIAGNOSIS — I442 Atrioventricular block, complete: Secondary | ICD-10-CM | POA: Diagnosis not present

## 2015-12-25 NOTE — Progress Notes (Signed)
Remote pacemaker transmission.   

## 2016-01-06 DIAGNOSIS — Z961 Presence of intraocular lens: Secondary | ICD-10-CM | POA: Diagnosis not present

## 2016-01-06 DIAGNOSIS — H353131 Nonexudative age-related macular degeneration, bilateral, early dry stage: Secondary | ICD-10-CM | POA: Diagnosis not present

## 2016-01-06 DIAGNOSIS — H52203 Unspecified astigmatism, bilateral: Secondary | ICD-10-CM | POA: Diagnosis not present

## 2016-01-21 ENCOUNTER — Encounter: Payer: Self-pay | Admitting: Cardiology

## 2016-01-28 LAB — CUP PACEART REMOTE DEVICE CHECK
Brady Statistic RV Percent Paced: 99 %
Date Time Interrogation Session: 20170614103522
Implantable Lead Implant Date: 20161108
Implantable Lead Location: 753859
Implantable Lead Location: 753860
Lead Channel Pacing Threshold Amplitude: 0.5 V
Lead Channel Sensing Intrinsic Amplitude: 12 mV
Lead Channel Sensing Intrinsic Amplitude: 5 mV
Lead Channel Setting Pacing Amplitude: 0.75 V
Lead Channel Setting Pacing Pulse Width: 0.5 ms
MDC IDC LEAD IMPLANT DT: 20161108
MDC IDC MSMT LEADCHNL RV PACING THRESHOLD PULSEWIDTH: 0.5 ms
MDC IDC SET LEADCHNL RA PACING AMPLITUDE: 2 V
MDC IDC SET LEADCHNL RV SENSING SENSITIVITY: 5 mV
MDC IDC STAT BRADY RA PERCENT PACED: 58 %
Pulse Gen Serial Number: 7825693

## 2016-02-12 ENCOUNTER — Other Ambulatory Visit: Payer: Self-pay | Admitting: Internal Medicine

## 2016-02-12 ENCOUNTER — Encounter: Payer: Self-pay | Admitting: Cardiology

## 2016-02-12 ENCOUNTER — Ambulatory Visit (INDEPENDENT_AMBULATORY_CARE_PROVIDER_SITE_OTHER): Payer: Medicare Other | Admitting: Cardiology

## 2016-02-12 VITALS — BP 124/74 | HR 60 | Ht 69.0 in | Wt 215.0 lb

## 2016-02-12 DIAGNOSIS — I309 Acute pericarditis, unspecified: Secondary | ICD-10-CM

## 2016-02-12 DIAGNOSIS — I421 Obstructive hypertrophic cardiomyopathy: Secondary | ICD-10-CM

## 2016-02-12 DIAGNOSIS — Z954 Presence of other heart-valve replacement: Secondary | ICD-10-CM | POA: Diagnosis not present

## 2016-02-12 DIAGNOSIS — I10 Essential (primary) hypertension: Secondary | ICD-10-CM

## 2016-02-12 DIAGNOSIS — I6529 Occlusion and stenosis of unspecified carotid artery: Secondary | ICD-10-CM

## 2016-02-12 DIAGNOSIS — Z952 Presence of prosthetic heart valve: Secondary | ICD-10-CM

## 2016-02-12 DIAGNOSIS — I350 Nonrheumatic aortic (valve) stenosis: Secondary | ICD-10-CM

## 2016-02-12 DIAGNOSIS — Z950 Presence of cardiac pacemaker: Secondary | ICD-10-CM

## 2016-02-12 NOTE — Patient Instructions (Signed)
Medication Instructions:   Your physician recommends that you continue on your current medications as directed. Please refer to the Current Medication list given to you today.    Follow-Up:  4 MONTHS WITH DR NELSON       If you need a refill on your cardiac medications before your next appointment, please call your pharmacy.   

## 2016-02-12 NOTE — Progress Notes (Signed)
Patient ID: Robert Anderson, male   DOB: Jan 04, 1924, 82 y.o.   MRN: [id number]    CARDIOLOGY OFFICE NOTE  Date:  02/12/2016   Robert Anderson Date of Birth: Nov 17, 1923 Medical Record [id number]  PCP:  Joycelyn Man, MD  Cardiologist:  Meda Coffee    Chief complain: 3 month follow up  History of Present Illness: Robert Anderson is a 82 y.o. male who presents today for a follow up visit.  He has a history of severe AS s/p TAVR 03/25/15, HOCM, LBBB, 1st degree AVB, essential HTN, h/o TIA & CVA.  LHC 02/2015: minor nonobstructive CAD. 2D echo 04/25/15: vigorous LV systolic function with near-cavity obliteration, moderate LVH, EF 65-70%, grade 1 DD, mod LAE. His post op course was complicated by symptomatic 2:1 block, s/p PM placement.  He presented to the ER on 11/21 with chest pain and was diagnosed with and acute pericarditis, started on Colchicine, ibuprofen, symptoms of chest pain are now resolved. He is off all medicines. He saw Dr. Lovena Le in December for follow-up for pacemaker placement and repositioning of the lead that Dislodged, the pacemakers working properly.  02/12/2016, the patient is coming after 3 months, he feels and looks great, he exercises 3 times a week in the gym doing 40 minutes of cardio 20 minutes strength exercises without any chest pain or shortness of breath. He works out on his stationary bike at home. He denies palpitations or syncope. His compliant with his medicines. He is no lower extremity edema and his only concern is mild orthostatic hypotension with some dizziness when he stands up.   Past Medical History  Diagnosis Date  . Essential hypertension   . ERECTILE DYSFUNCTION   . HEARING LOSS   . TIA (transient ischemic attack) 01/21/2014    5 min spell aphasia without assoc sx    . Severe aortic stenosis     a. s/p TAVR 03/2015. Pre-op  LHC 02/2015: minor nonobstructive CAD.  Marland Kitchen Hypertrophic obstructive cardiomyopathy ([place])   . Stroke ([place]) 02/19/2014    Small infarct high posterior  left frontal lobe on MRI   . 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 LBBB (left bundle branch block)   . First degree AV block   . Bradycardia     a. HR 30s in clinic 06/2015 - BB discontinued.  Marland Kitchen Heart block     s/p Pacemaker  . DEGENERATIVE JOINT DISEASE, SPINE   . OA (osteoarthritis)     both hips  . Hyperlipidemia   . Presence of permanent cardiac pacemaker 06/24/2015    St Jude Assurity  . Squamous cell skin cancer     left lower leg  . Prostate cancer [facility]) 2010    "external radiation; 40 treatments"  . Pericardial effusion     a. micorpeforation s/p lead revision 06/26/15  . Atrial fibrillation ([place])     a. dx after lead revision 06/26/15 >> anticoag not started due to recent pericardial effusion in setting of perforation and lead revision   Past Surgical History  Procedure Laterality Date  . Joint replacement    . Cataract extraction w/ intraocular lens  implant, bilateral  2004; 2010    right; left  . Tee without cardioversion N/A 03/07/2015    Procedure: TRANSESOPHAGEAL ECHOCARDIOGRAM (TEE);  Surgeon: Dorothy Spark, MD;  Location: [place];  Service: Cardiovascular;  Laterality: N/A;  .  Total shoulder arthroplasty Right 2007    "wore it out playing tennis"  . Tonsillectomy    . Anterior cervical decomp/discectomy fusion  ~ 2000    Dr. Louanne Skye  . Transcatheter aortic valve replacement, transfemoral N/A 03/25/2015    Procedure: TRANSCATHETER AORTIC VALVE REPLACEMENT, TRANSFEMORAL;  Surgeon: Burnell Blanks, MD;  Location: [place];  Service: Open Heart Surgery;  Laterality: N/A;  . Tee without cardioversion N/A 03/25/2015    Procedure: TRANSESOPHAGEAL ECHOCARDIOGRAM (TEE);  Surgeon: Burnell Blanks, MD;  Location: [place];  Service: Open Heart Surgery;  Laterality: N/A;  . Back surgery    . Cardiac catheterization N/A 02/20/2015    Procedure:  Right/Left Heart Cath and Coronary Angiography;  Surgeon: Sherren Mocha, MD;  Location: [place] [facility];  Service: Cardiovascular;  Laterality: N/A;  . Cardiac valve replacement    . Insert / replace / remove pacemaker  06/24/2015  . Squamous cell carcinoma excision Left X 3    "lower leg"  . Ep implantable device N/A 06/24/2015    Procedure: Pacemaker Implant;  Surgeon: Evans Lance, MD;  Location: [place] [facility];  Service: Cardiovascular; St Jude Assurity   . Ep implantable device N/A 06/26/2015    Procedure: Lead Revision/Repair;  Surgeon: Will Meredith Leeds, MD;  Location: [place] [facility];  Service: Cardiovascular;  Laterality: N/A;   Medications: Current Outpatient Prescriptions  Medication Sig Dispense Refill  . acetaminophen (TYLENOL) 325 MG tablet Take 1-2 tablets (325-650 mg total) by mouth every 4 (four) hours as needed for mild pain.    Marland Kitchen clopidogrel (PLAVIX) 75 MG tablet TAKE 1 TABLET (75 MG TOTAL) BY MOUTH DAILY. 90 tablet 3  . docusate sodium (COLACE) 100 MG capsule Take 300 mg by mouth at bedtime.     . metoprolol tartrate (LOPRESSOR) 25 MG tablet Take 0.5 tablets (12.5 mg total) by mouth 2 (two) times daily.    . Omega-3 Fatty Acids (FISH OIL) 1000 MG CAPS Take 1,000 mg by mouth 2 (two) times daily.    . tamsulosin (FLOMAX) 0.4 MG CAPS capsule TAKE 1 CAPSULE (0.4 MG TOTAL) BY MOUTH AT BEDTIME. 90 capsule 1   No current facility-administered medications for this visit.   Allergies: No Known Allergies  Social History: The patient  reports that he has never smoked. He has never used smokeless tobacco. He reports that he does not drink alcohol or use illicit drugs.   Family History: The patient's family history includes CVA in his father and mother; Cancer in his father and sister; Heart attack in his brother; Heart disease in his brother and mother; Lung cancer in his brother; Prostate cancer in his brother; Stroke in his mother. There is no history of  Hypertension.   Review of Systems: Please see the history of present illness.   Otherwise, the review of systems is positive for none.   All other systems are reviewed and negative.   Physical Exam: VS:  BP 124/74 mmHg  Pulse 60  Ht 5\' 9"  (1.753 m)  Wt 215 lb (97.523 kg)  BMI 31.74 kg/m2 .  BMI Body mass index is 31.74 kg/(m^2).  Wt Readings from Last 3 Encounters:  02/12/16 215 lb (97.523 kg)  10/20/15 215 lb (97.523 kg)  10/02/15 215 lb (97.523 kg)   General: Pleasant. Elderly male - looks chronically ill but in no acute distress. Left arm dressing in place.  HEENT: Normal. Neck: Supple, no JVD, carotid bruits, or masses noted.  Cardiac: RRR,  no murmur, no rub, healed scar post PM placement, no discharge Respiratory:  Lungs are clear to auscultation bilaterally with normal work of breathing.  GI: Soft and nontender.  MS: No deformity or atrophy. Gait and ROM intact. Using a walker.  Skin: Warm and dry. Color is normal.  Neuro:  Strength and sensation are intact and no gross focal deficits noted.  Psych: Alert, appropriate and with normal affect.  LABORATORY DATA:  EKG:  EKG is ordered today. This demonstrates complete heart block - rate of 40 - reviewed with Dr. Lovena Le here in the office.  Lab Results  Component Value Date   WBC 6.2 07/07/2015   HGB 12.6* 07/07/2015   HCT 39.7 07/07/2015   PLT 188 07/07/2015   GLUCOSE 120* 07/07/2015   CHOL 158 05/20/2015   TRIG 161.0* 05/20/2015   HDL 45.10 05/20/2015   LDLDIRECT 134.9 05/03/2013   LDLCALC 80 05/20/2015   ALT 12* 07/07/2015   AST 13* 07/07/2015   NA 141 07/07/2015   K 4.3 07/07/2015   CL 109 07/07/2015   CREATININE 1.03 07/07/2015   BUN 17 07/07/2015   CO2 28 07/07/2015   TSH 1.704 06/23/2015   PSA 0.07* 05/20/2015   INR 1.10 06/23/2015   HGBA1C 6.1* 03/21/2015   Other Studies Reviewed Today:  Echo Study Conclusions from 07/06/2015 - Left ventricle: The cavity size was normal. There was  moderate asymmetric septal hypertrophy. Systolic function was normal. The estimated ejection fraction was in the range of 60% to 65%. Wall motion was normal; there were no regional wall motion abnormalities. Doppler parameters are consistent with abnormal left ventricular relaxation (grade 1 diastolic dysfunction). - Aortic valve: Bioprosthetic aortic valve s/p TAVR. Trivial peri-valvular aortic insufficiency. There was a mild LV outflow tract gradient. However, this was not fully separated out from the gradient across the aortic valve. I do not think that the gradient across the bioprosthetic aortic valve was significantly elevated. Mean gradient (S): 12 mm Hg. Peak gradient (S): 29 mm Hg. - Mitral valve: There was systolic anterior motion of the anterior leaflet of the mitral valve. Moderately calcified annulus. There was mild to moderate regurgitation. - Left atrium: The atrium was moderately dilated. - Right ventricle: The cavity size was normal. Pacer wire or catheter noted in right ventricle. Systolic function was normal. - Tricuspid valve: Peak RV-RA gradient (S): 29 mm Hg. - Pulmonary arteries: PA peak pressure: 32 mm Hg (S). - Inferior vena cava: The vessel was normal in size. The respirophasic diameter changes were in the normal range (>= 50%), consistent with normal central venous pressure. - Pericardium, extracardiac: A trivial pericardial effusion was identified posterior to the heart.  Impressions:  - Normal LV size with moderate asymmetric septal hypertrophy. EF 60-65%. There was systolic anterior motion of the anterior leaflet of the mitral valve with mild to moderate MR. There was a bioprosthetic aortic valve s/p TAVR with trivial AI that appeared to function normally. There appeared to be a small LV outflow tract gradient likely related to the [facility], but this was not fully delineated from the gradient across the aortic  valve. Overall picture consistent with HOCM.  EKG performed today in 02/12/2016 shows atrial paced rhythm with left axis deviation and left bundle branch block.    Assessment/Plan:  1. H/o Severe AS, s/p TAVR, he is doing great, he is advised to do predominantly cardio exercise and limit isometric exercises with history of HTN. He has had no syncopal episode.   2. HCM -  on BB, tolerating well now post PM placement, BP borderline, unable to uptitrate, no dizziness, syncope  3. Symptomatic 2:1 bloct - s/p PM placement, functioning well  4. Acute pericarditis - resolved  5. Essential HTN - controlled, and rather orthostatic hypotension however we history of HCM he needs to be on beta blocker will continue.  6. Muscle weakness and pain - discontinue pravastatin, start fish oil only, his LDL was well controlled triglyceride mildly elevated, however patient is 89 and on last year cardiac He had only minimal nonobstructive CAD.  Follow up in 4 months  Signed: Ena Dawley, MD  02/12/2016 12:23 PM  [facility] [address] [place], [place]  [address] Phone: [phone] Fax: [phone]

## 2016-02-20 ENCOUNTER — Ambulatory Visit (HOSPITAL_COMMUNITY)
Admission: RE | Admit: 2016-02-20 | Discharge: 2016-02-20 | Disposition: A | Discharge status: Home / Self Care | Payer: Medicare Other | Source: Ambulatory Visit | Attending: Cardiology | Admitting: Cardiology

## 2016-02-20 DIAGNOSIS — I6523 Occlusion and stenosis of bilateral carotid arteries: Secondary | ICD-10-CM | POA: Diagnosis not present

## 2016-02-20 DIAGNOSIS — I6529 Occlusion and stenosis of unspecified carotid artery: Secondary | ICD-10-CM | POA: Diagnosis present

## 2016-02-20 DIAGNOSIS — E785 Hyperlipidemia, unspecified: Secondary | ICD-10-CM | POA: Diagnosis not present

## 2016-02-20 DIAGNOSIS — Z8673 Personal history of transient ischemic attack (TIA), and cerebral infarction without residual deficits: Secondary | ICD-10-CM | POA: Diagnosis not present

## 2016-02-20 DIAGNOSIS — I10 Essential (primary) hypertension: Secondary | ICD-10-CM | POA: Diagnosis not present

## 2016-02-23 ENCOUNTER — Encounter (HOSPITAL_COMMUNITY): Payer: Medicare Other

## 2016-03-16 DIAGNOSIS — N50812 Left testicular pain: Secondary | ICD-10-CM | POA: Diagnosis not present

## 2016-03-16 DIAGNOSIS — N3281 Overactive bladder: Secondary | ICD-10-CM | POA: Diagnosis not present

## 2016-03-16 DIAGNOSIS — Z8546 Personal history of malignant neoplasm of prostate: Secondary | ICD-10-CM | POA: Diagnosis not present

## 2016-03-24 ENCOUNTER — Encounter: Payer: Self-pay | Admitting: Cardiovascular Disease

## 2016-03-25 ENCOUNTER — Ambulatory Visit (INDEPENDENT_AMBULATORY_CARE_PROVIDER_SITE_OTHER): Payer: Medicare Other | Admitting: *Deleted

## 2016-03-25 DIAGNOSIS — Z950 Presence of cardiac pacemaker: Secondary | ICD-10-CM | POA: Diagnosis not present

## 2016-03-25 DIAGNOSIS — I442 Atrioventricular block, complete: Secondary | ICD-10-CM | POA: Diagnosis not present

## 2016-03-25 NOTE — Progress Notes (Signed)
Remote pacemaker transmission.   

## 2016-03-30 ENCOUNTER — Other Ambulatory Visit: Payer: Self-pay | Admitting: Cardiology

## 2016-03-30 ENCOUNTER — Encounter: Payer: Self-pay | Admitting: Cardiology

## 2016-03-31 LAB — CUP PACEART REMOTE DEVICE CHECK
Battery Remaining Longevity: 111 mo
Brady Statistic AP VS Percent: 1 %
Brady Statistic AS VP Percent: 37 %
Brady Statistic AS VS Percent: 1 %
Brady Statistic RA Percent Paced: 61 %
Date Time Interrogation Session: 20170810060015
Implantable Lead Location: 753859
Implantable Lead Location: 753860
Lead Channel Impedance Value: 410 Ohm
Lead Channel Pacing Threshold Amplitude: 0.625 V
Lead Channel Pacing Threshold Pulse Width: 0.5 ms
Lead Channel Sensing Intrinsic Amplitude: 12 mV
Lead Channel Setting Pacing Amplitude: 0.875
Lead Channel Setting Pacing Amplitude: 2 V
Lead Channel Setting Pacing Pulse Width: 0.5 ms
MDC IDC LEAD IMPLANT DT: 20161108
MDC IDC LEAD IMPLANT DT: 20161108
MDC IDC MSMT BATTERY REMAINING PERCENTAGE: 95.5 %
MDC IDC MSMT BATTERY VOLTAGE: 2.98 V
MDC IDC MSMT LEADCHNL RA PACING THRESHOLD AMPLITUDE: 0.75 V
MDC IDC MSMT LEADCHNL RA PACING THRESHOLD PULSEWIDTH: 0.5 ms
MDC IDC MSMT LEADCHNL RA SENSING INTR AMPL: 5 mV
MDC IDC MSMT LEADCHNL RV IMPEDANCE VALUE: 460 Ohm
MDC IDC SET LEADCHNL RV SENSING SENSITIVITY: 5 mV
MDC IDC STAT BRADY AP VP PERCENT: 62 %
MDC IDC STAT BRADY RV PERCENT PACED: 99 %
Pulse Gen Model: 2240
Pulse Gen Serial Number: 7825693

## 2016-04-08 ENCOUNTER — Ambulatory Visit (INDEPENDENT_AMBULATORY_CARE_PROVIDER_SITE_OTHER): Payer: Medicare Other | Admitting: Cardiovascular Disease

## 2016-04-08 ENCOUNTER — Ambulatory Visit (HOSPITAL_COMMUNITY): Payer: Medicare Other | Attending: Cardiology

## 2016-04-08 ENCOUNTER — Other Ambulatory Visit: Payer: Self-pay

## 2016-04-08 VITALS — BP 130/70 | HR 64 | Ht 69.0 in | Wt 222.2 lb

## 2016-04-08 DIAGNOSIS — Z954 Presence of other heart-valve replacement: Secondary | ICD-10-CM

## 2016-04-08 DIAGNOSIS — I517 Cardiomegaly: Secondary | ICD-10-CM | POA: Diagnosis not present

## 2016-04-08 DIAGNOSIS — I447 Left bundle-branch block, unspecified: Secondary | ICD-10-CM | POA: Insufficient documentation

## 2016-04-08 DIAGNOSIS — I350 Nonrheumatic aortic (valve) stenosis: Secondary | ICD-10-CM

## 2016-04-08 DIAGNOSIS — Z952 Presence of prosthetic heart valve: Secondary | ICD-10-CM

## 2016-04-08 DIAGNOSIS — Z953 Presence of xenogenic heart valve: Secondary | ICD-10-CM | POA: Insufficient documentation

## 2016-04-08 DIAGNOSIS — E785 Hyperlipidemia, unspecified: Secondary | ICD-10-CM | POA: Insufficient documentation

## 2016-04-08 NOTE — Progress Notes (Signed)
Chief Complaint  Patient presents with  . Aortic Stenosis      History of Present Illness: 82 y.o. male with history of severe aortic valve stenosis, HOCM who underwent TAVR on 03/25/15. who presents for one year TAVR follow up. His TAVR was performed from the femoral artery approach and a 29 mm Sapien 3 valve was placed on 03/25/15. His post-operative course was uneventful. He continues on metoprolol 12.5 mg po BID.  He is followed in our office by Dr. Meda Coffee.   He is here today for follow up. He is exercising and is feeling well with no chest pain or SOB. No LE edema. Exercise tolerance is improved but he still has some fatigue with moderate exertion.   Primary Care Physician: Joycelyn Man, MD   Past Medical History:  Diagnosis Date  . Atrial fibrillation ([place])    a. dx after lead revision 06/26/15 >> anticoag not started due to recent pericardial effusion in setting of perforation and lead revision  . Bradycardia    a. HR 30s in clinic 06/2015 - BB discontinued.  . DEGENERATIVE JOINT DISEASE, SPINE   . ERECTILE DYSFUNCTION   . Essential hypertension   . First degree AV block   . HEARING LOSS   . Heart block    s/p Pacemaker  . Hyperlipidemia   . Hypertrophic obstructive cardiomyopathy ([place])   . LBBB (left bundle branch block)   . OA (osteoarthritis)    both hips  . Pericardial effusion    a. micorpeforation s/p lead revision 06/26/15  . Presence of permanent cardiac pacemaker 06/24/2015   St Jude Assurity  . Prostate cancer [facility]) 2010   "external radiation; 40 treatments"  . 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 Severe aortic stenosis    a. s/p TAVR 03/2015. Pre-op  LHC 02/2015: minor nonobstructive CAD.  Marland Kitchen Squamous cell skin cancer    left lower leg  . Stroke ([place]) 02/19/2014   Small infarct high posterior left frontal lobe on MRI   . TIA (transient  ischemic attack) 01/21/2014   5 min spell aphasia without assoc sx      Past Surgical History:  Procedure Laterality Date  . ANTERIOR CERVICAL DECOMP/DISCECTOMY FUSION  ~ 2000   Dr. Louanne Skye  . BACK SURGERY    . CARDIAC CATHETERIZATION N/A 02/20/2015   Procedure: Right/Left Heart Cath and Coronary Angiography;  Surgeon: Sherren Mocha, MD;  Location: [place] [facility];  Service: Cardiovascular;  Laterality: N/A;  . CARDIAC VALVE REPLACEMENT    . CATARACT EXTRACTION W/ INTRAOCULAR LENS  IMPLANT, BILATERAL  2004; 2010   right; left  . EP IMPLANTABLE DEVICE N/A 06/24/2015   Procedure: Pacemaker Implant;  Surgeon: Evans Lance, MD;  Location: [place] [facility];  Service: Cardiovascular; St Jude Assurity   . EP IMPLANTABLE DEVICE N/A 06/26/2015   Procedure: Lead Revision/Repair;  Surgeon: Will Meredith Leeds, MD;  Location: [place] [facility];  Service: Cardiovascular;  Laterality: N/A;  . INSERT / REPLACE / REMOVE PACEMAKER  06/24/2015  . JOINT REPLACEMENT    . SQUAMOUS CELL CARCINOMA EXCISION Left X 3   "lower leg"  . TEE WITHOUT CARDIOVERSION N/A 03/07/2015   Procedure: TRANSESOPHAGEAL ECHOCARDIOGRAM (TEE);  Surgeon: Dorothy Spark, MD;  Location: [place];  Service: Cardiovascular;  Laterality: N/A;  . TEE WITHOUT CARDIOVERSION N/A 03/25/2015   Procedure: TRANSESOPHAGEAL ECHOCARDIOGRAM (TEE);  Surgeon: Burnell Blanks, MD;  Location: [place];  Service: Open Heart Surgery;  Laterality: N/A;  . TONSILLECTOMY    . TOTAL SHOULDER ARTHROPLASTY Right 2007   "wore it out playing tennis"  . TRANSCATHETER AORTIC VALVE REPLACEMENT, TRANSFEMORAL N/A 03/25/2015   Procedure: TRANSCATHETER AORTIC VALVE REPLACEMENT, TRANSFEMORAL;  Surgeon: Burnell Blanks, MD;  Location: [place];  Service: Open Heart Surgery;  Laterality: N/A;    Current Outpatient Prescriptions  Medication Sig Dispense Refill  . acetaminophen (TYLENOL) 325 MG tablet Take 1-2 tablets (325-650 mg total) by mouth every 4  (four) hours as needed for mild pain.    Marland Kitchen clopidogrel (PLAVIX) 75 MG tablet TAKE 1 TABLET (75 MG TOTAL) BY MOUTH DAILY. 90 tablet 3  . docusate sodium (COLACE) 100 MG capsule Take 300 mg by mouth at bedtime.     . metoprolol tartrate (LOPRESSOR) 25 MG tablet Take 0.5 tablets (12.5 mg total) by mouth 2 (two) times daily.    . Omega-3 Fatty Acids (FISH OIL) 1000 MG CAPS Take 1,000 mg by mouth 2 (two) times daily.    . tamsulosin (FLOMAX) 0.4 MG CAPS capsule TAKE 1 CAPSULE (0.4 MG TOTAL) BY MOUTH AT BEDTIME. 90 capsule 1   No current facility-administered medications for this visit.     No Known Allergies  Social History   Social History  . Marital status: Married    Spouse name: N/A  . Number of children: N/A  . Years of education: N/A   Occupational History  . Retired    Social History Main Topics  . Smoking status: Never Smoker  . Smokeless tobacco: Never Used  . Alcohol use No  . Drug use: No  . Sexual activity: Not Currently   Other Topics Concern  . Not on file   Social History Narrative   Patient lives in [place] with his wife.    Family History  Problem Relation Age of Onset  . Heart disease Mother   . CVA Mother   . Stroke Mother   . Cancer Father   . CVA Father   . Cancer Sister   . Prostate cancer Brother   . Heart disease Brother   . Lung cancer Brother   . Heart attack Brother   . Hypertension Neg Hx     Review of Systems:  As stated in the HPI and otherwise negative.   BP 130/70   Pulse 64   Ht 5\' 9"  (1.753 m)   Wt 222 lb 3.2 oz (100.8 kg)   SpO2 94%   BMI 32.81 kg/m   Physical Examination: General: Well developed, well nourished, NAD  HEENT: OP clear, mucus membranes moist  SKIN: warm, dry. No rashes. Neuro: No focal deficits  Musculoskeletal: Muscle strength 5/5 all ext  Psychiatric: Mood and affect normal  Neck: No JVD, no carotid bruits, no thyromegaly, no lymphadenopathy.  Lungs:Clear bilaterally, no wheezes, rhonci,  crackles Cardiovascular: Regular rate and rhythm. No murmurs, gallops or rubs. Abdomen:Soft. Bowel sounds present. Non-tender.  Extremities: No lower extremity edema. Pulses are 2 + in the bilateral DP/PT.  Echo 04/08/16: Left ventricle: The cavity size was normal. There was moderate   asymmetric septal hypertrophy. Systolic function was normal. The   estimated ejection fraction was in the range of 60% to 65%. Wall   motion was normal; there were no regional wall motion   abnormalities. Doppler parameters are consistent with abnormal   left ventricular relaxation (grade 1 diastolic dysfunction). No   LV  outflow tract gradient was measured. - Aortic valve: Bioprosthetic aortic valve s/p transcatheter aortic   valve replacement. The TAVR valve appeared to function normally.   There was no significant regurgitation. Mean gradient (S): 11 mm   Hg. - Mitral valve: Mild systolic anterior motion of the anterior   mitral leaflet. - Left atrium: The atrium was mildly dilated. - Right ventricle: The cavity size was normal. Systolic function   was normal. - Right atrium: The atrium was mildly dilated. - Pulmonary arteries: No complete TR doppler jet so unable to   estimate PA systolic pressure. - Inferior vena cava: The vessel was normal in size. The   respirophasic diameter changes were in the normal range (>= 50%),   consistent with normal central venous pressure.  Impressions:  - Normal LV size with moderate asymmetric septal hypertrophy. EF   60-65%. Mild systolic anterior motion of the mitral valve. No LV   outflow tract gradient measured. Consistent with hypertrophic   cardiomyopathy. Normal RV size and systolic function. s/p TAVR,   valve appears to function normally.   EKG:  EKG is not ordered today. The ekg ordered today demonstrates   Recent Labs: 06/23/2015: TSH 1.704 07/07/2015: ALT 12; BUN 17; Creatinine, Ser 1.03; Hemoglobin 12.6; Platelets 188; Potassium 4.3; Sodium 141    Lipid Panel    Component Value Date/Time   CHOL 158 05/20/2015 0848   TRIG 161.0 (H) 05/20/2015 0848   HDL 45.10 05/20/2015 0848   CHOLHDL 3 05/20/2015 0848   VLDL 32.2 05/20/2015 0848   LDLCALC 80 05/20/2015 0848   LDLDIRECT 134.9 05/03/2013 0951     Wt Readings from Last 3 Encounters:  04/08/16 222 lb 3.2 oz (100.8 kg)  02/12/16 215 lb (97.5 kg)  10/20/15 215 lb (97.5 kg)     Other studies Reviewed: Additional studies/ records that were reviewed today include: . Review of the above records demonstrates:    Assessment and Plan:   1. Severe aortic valve stenosis: He is s/p TAVR on 03/25/15. He has done well. Echo today shows normally functioning aortic valve prosthesis. He is NYHA class 2. Will continue Plavix for now. Will continue metoprolol 12.5 mg po BID given his HOCM. He will continue to follow with Dr. Meda Coffee.   Current medicines are reviewed at length with the patient today.  The patient does not have concerns regarding medicines.  The following changes have been made:  no change  Labs/ tests ordered today include:  No orders of the defined types were placed in this encounter.   Disposition:   FU with DR. Nelson as planned.    Signed, Lauree Chandler, MD 04/08/2016 2:53 PM    [facility], [place], [place]  [address] Phone: [phone]; Fax: [phone]

## 2016-04-08 NOTE — Patient Instructions (Addendum)
Medication Instructions:  Your physician recommends that you continue on your current medications as directed. Please refer to the Current Medication list given to you today.   Labwork: none  Testing/Procedures: none  Follow-Up:  Follow up with Dr. Meda Coffee and Dr. Lovena Le as planned    Any Other Special Instructions Will Be Listed Below (If Applicable).     If you need a refill on your cardiac medications before your next appointment, please call your pharmacy.

## 2016-04-12 ENCOUNTER — Ambulatory Visit (INDEPENDENT_AMBULATORY_CARE_PROVIDER_SITE_OTHER): Payer: Medicare Other | Admitting: Cardiology

## 2016-04-12 ENCOUNTER — Telehealth: Payer: Self-pay

## 2016-04-12 ENCOUNTER — Encounter: Payer: Self-pay | Admitting: Cardiology

## 2016-04-12 ENCOUNTER — Telehealth: Payer: Self-pay | Admitting: *Deleted

## 2016-04-12 VITALS — BP 124/64 | HR 60 | Ht 69.0 in | Wt 220.0 lb

## 2016-04-12 DIAGNOSIS — I309 Acute pericarditis, unspecified: Secondary | ICD-10-CM

## 2016-04-12 DIAGNOSIS — I4891 Unspecified atrial fibrillation: Secondary | ICD-10-CM

## 2016-04-12 DIAGNOSIS — I350 Nonrheumatic aortic (valve) stenosis: Secondary | ICD-10-CM

## 2016-04-12 DIAGNOSIS — Z789 Other specified health status: Secondary | ICD-10-CM

## 2016-04-12 DIAGNOSIS — I422 Other hypertrophic cardiomyopathy: Secondary | ICD-10-CM

## 2016-04-12 DIAGNOSIS — I10 Essential (primary) hypertension: Secondary | ICD-10-CM

## 2016-04-12 DIAGNOSIS — Z889 Allergy status to unspecified drugs, medicaments and biological substances status: Secondary | ICD-10-CM

## 2016-04-12 DIAGNOSIS — Z954 Presence of other heart-valve replacement: Secondary | ICD-10-CM

## 2016-04-12 DIAGNOSIS — Z7901 Long term (current) use of anticoagulants: Secondary | ICD-10-CM

## 2016-04-12 DIAGNOSIS — Z5181 Encounter for therapeutic drug level monitoring: Secondary | ICD-10-CM

## 2016-04-12 DIAGNOSIS — Z952 Presence of prosthetic heart valve: Secondary | ICD-10-CM

## 2016-04-12 DIAGNOSIS — Z950 Presence of cardiac pacemaker: Secondary | ICD-10-CM

## 2016-04-12 MED ORDER — APIXABAN 5 MG PO TABS: 5.0000 mg | ORAL_TABLET | Freq: Two times a day (BID) | ORAL | 3 refills | Status: DC

## 2016-04-12 NOTE — Telephone Encounter (Signed)
Spoke with patient's wife about the denial. Message to Dr. Meda Coffee also.

## 2016-04-12 NOTE — Telephone Encounter (Signed)
If I wanted to appeal who would take care of it? Thank you, Ena Dawley

## 2016-04-12 NOTE — Patient Instructions (Signed)
Medication Instructions:  Stop taking Plavix and start taking Eliquis 5 mg twice daily All other medications remain the same  Labwork: In 1 month  Testing/Procedures: None  Follow-Up: In 2 month or first available with Dr Meda Coffee     If you need a refill on your cardiac medications before your next appointment, please call your pharmacy.

## 2016-04-12 NOTE — Telephone Encounter (Signed)
Eliquis has been denied. He has an AVR. Medicare abides by FDA recommendations, even though he had his AVR before developing A Fib.

## 2016-04-12 NOTE — Telephone Encounter (Signed)
Unfortunately they consider this outside FDA labeling.  You could consider an appeals process or change patient to Coumadin.

## 2016-04-12 NOTE — Telephone Encounter (Signed)
Robert Anderson, are therer any ways around this? This is the patient I told you about this morning.

## 2016-04-12 NOTE — Progress Notes (Signed)
Patient ID: Robert Anderson, male   DOB: 06/18/1924, 82 y.o.   MRN: [id number]    CARDIOLOGY OFFICE NOTE  Date:  04/12/2016   Robert Anderson Date of Birth: 11/22/1923 Medical Record [id number]  PCP:  Robert Man, MD  Cardiologist:  Meda Coffee    Chief complain: 3 month follow up  History of Present Illness: Robert Anderson is a 82 y.o. male who presents today for a follow up visit.  He has a history of severe AS s/p TAVR 03/25/15, HOCM, LBBB, 1st degree AVB, essential HTN, h/o TIA & CVA.  LHC 02/2015: minor nonobstructive CAD. 2D echo 04/25/15: vigorous LV systolic function with near-cavity obliteration, moderate LVH, EF 65-70%, grade 1 DD, mod LAE. His post op course was complicated by symptomatic 2:1 block, s/p PM placement.  He presented to the ER on 11/21 with chest pain and was diagnosed with and acute pericarditis, started on Colchicine, ibuprofen, symptoms of chest pain are now resolved. He is off all medicines. He saw Dr. Lovena Le in December for follow-up for pacemaker placement and repositioning of the lead that Dislodged, the pacemakers working properly.  04/12/2016-  the patient is coming after 2 months, this is an extra visit, on his pacemaker entered patient he was found to have episodes of atrial fibrillation one of them lasting or 4 hours. Since he has a prior history of TIA the brought him in to discuss anticoagulation. Patient is otherwise doing well he denies any chest pain or shortness of breath he was just seen by Dr. Merlene Pulling in his clinic the week ago and underwent echocardiography that showed normal gradients across his aortic valve. He denies any signs of bleeding or easy bruising. He is complaining of dizziness that happens every few months and most recently was this last Saturday while standing but it resolved after 30 minutes at rest. No syncope.  Past Medical History:  Diagnosis Date  . Atrial fibrillation ([place])    a. dx after lead revision 06/26/15 >> anticoag not started due to  recent pericardial effusion in setting of perforation and lead revision  . Bradycardia    a. HR 30s in clinic 06/2015 - BB discontinued.  . DEGENERATIVE JOINT DISEASE, SPINE   . ERECTILE DYSFUNCTION   . Essential hypertension   . First degree AV block   . HEARING LOSS   . Heart block    s/p Pacemaker  . Hyperlipidemia   . Hypertrophic obstructive cardiomyopathy ([place])   . LBBB (left bundle branch block)   . OA (osteoarthritis)    both hips  . Pericardial effusion    a. micorpeforation s/p lead revision 06/26/15  . Presence of permanent cardiac pacemaker 06/24/2015   St Jude Assurity  . Prostate cancer [facility]) 2010   "external radiation; 40 treatments"  . 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 Severe aortic stenosis    a. s/p TAVR 03/2015. Pre-op  LHC 02/2015: minor nonobstructive CAD.  Marland Kitchen Squamous cell skin cancer    left lower leg  . Stroke ([place]) 02/19/2014   Small infarct high posterior left frontal lobe on MRI   . TIA (transient ischemic attack) 01/21/2014   5 min spell aphasia without assoc sx     Past Surgical History:  Procedure Laterality Date  . ANTERIOR CERVICAL DECOMP/DISCECTOMY FUSION  ~ 2000   Dr. Louanne Skye  . BACK SURGERY    .  CARDIAC CATHETERIZATION N/A 02/20/2015   Procedure: Right/Left Heart Cath and Coronary Angiography;  Surgeon: Sherren Mocha, MD;  Location: [place] [facility];  Service: Cardiovascular;  Laterality: N/A;  . CARDIAC VALVE REPLACEMENT    . CATARACT EXTRACTION W/ INTRAOCULAR LENS  IMPLANT, BILATERAL  2004; 2010   right; left  . EP IMPLANTABLE DEVICE N/A 06/24/2015   Procedure: Pacemaker Implant;  Surgeon: Evans Lance, MD;  Location: [place] [facility];  Service: Cardiovascular; St Jude Assurity   . EP IMPLANTABLE DEVICE N/A 06/26/2015   Procedure: Lead Revision/Repair;  Surgeon: Will Meredith Leeds, MD;  Location: [place] [facility];   Service: Cardiovascular;  Laterality: N/A;  . INSERT / REPLACE / REMOVE PACEMAKER  06/24/2015  . JOINT REPLACEMENT    . SQUAMOUS CELL CARCINOMA EXCISION Left X 3   "lower leg"  . TEE WITHOUT CARDIOVERSION N/A 03/07/2015   Procedure: TRANSESOPHAGEAL ECHOCARDIOGRAM (TEE);  Surgeon: Dorothy Spark, MD;  Location: [facility];  Service: Cardiovascular;  Laterality: N/A;  . TEE WITHOUT CARDIOVERSION N/A 03/25/2015   Procedure: TRANSESOPHAGEAL ECHOCARDIOGRAM (TEE);  Surgeon: Burnell Blanks, MD;  Location: [place];  Service: Open Heart Surgery;  Laterality: N/A;  . TONSILLECTOMY    . TOTAL SHOULDER ARTHROPLASTY Right 2007   "wore it out playing tennis"  . TRANSCATHETER AORTIC VALVE REPLACEMENT, TRANSFEMORAL N/A 03/25/2015   Procedure: TRANSCATHETER AORTIC VALVE REPLACEMENT, TRANSFEMORAL;  Surgeon: Burnell Blanks, MD;  Location: [place] [facility];  Service: Open Heart Surgery;  Laterality: N/A;   Medications: Current Outpatient Prescriptions  Medication Sig Dispense Refill  . acetaminophen (TYLENOL) 325 MG tablet Take 1-2 tablets (325-650 mg total) by mouth every 4 (four) hours as needed for mild pain.    Marland Kitchen clopidogrel (PLAVIX) 75 MG tablet TAKE 1 TABLET (75 MG TOTAL) BY MOUTH DAILY. 90 tablet 3  . docusate sodium (COLACE) 100 MG capsule Take 300 mg by mouth at bedtime.     . metoprolol tartrate (LOPRESSOR) 25 MG tablet Take 0.5 tablets (12.5 mg total) by mouth 2 (two) times daily.    . Omega-3 Fatty Acids (FISH OIL) 1000 MG CAPS Take 1,000 mg by mouth 2 (two) times daily.    . tamsulosin (FLOMAX) 0.4 MG CAPS capsule Take 0.4 mg by mouth 3 (three) times a week. Monday, Wednesday, Friday     No current facility-administered medications for this visit.    Allergies: No Known Allergies  Social History: The patient  reports that he has never smoked. He has never used smokeless tobacco. He reports that he does not drink alcohol or use drugs.   Family History: The patient's family history  includes CVA in his father and mother; Cancer in his father and sister; Heart attack in his brother; Heart disease in his brother and mother; Lung cancer in his brother; Prostate cancer in his brother; Stroke in his mother.   Review of Systems: Please see the history of present illness.   Otherwise, the review of systems is positive for none.   All other systems are reviewed and negative.   Physical Exam: VS:  BP 124/64   Pulse 60   Ht 5\' 9"  (1.753 m)   Wt 220 lb (99.8 kg)   SpO2 96%   BMI 32.49 kg/m  .  BMI Body mass index is 32.49 kg/m.  Wt Readings from Last 3 Encounters:  04/12/16 220 lb (99.8 kg)  04/08/16 222 lb 3.2 oz (100.8 kg)  02/12/16 215 lb (97.5 kg)   General: Pleasant.  Elderly male - looks chronically ill but in no acute distress. Left arm dressing in place.   HEENT: Normal.  Neck: Supple, no JVD, carotid bruits, or masses noted.  Cardiac: RRR, no murmur, no rub, healed scar post PM placement, no discharge Respiratory:  Lungs are clear to auscultation bilaterally with normal work of breathing.  GI: Soft and nontender.  MS: No deformity or atrophy. Gait and ROM intact. Using a walker.   Skin: Warm and dry. Color is normal.  Neuro:  Strength and sensation are intact and no gross focal deficits noted.  Psych: Alert, appropriate and with normal affect.  LABORATORY DATA:  EKG:  EKG is ordered today. This demonstrates complete heart block - rate of 40 - reviewed with Dr. Lovena Le here in the office.  Lab Results  Component Value Date   WBC 6.2 07/07/2015   HGB 12.6 (L) 07/07/2015   HCT 39.7 07/07/2015   PLT 188 07/07/2015   GLUCOSE 120 (H) 07/07/2015   CHOL 158 05/20/2015   TRIG 161.0 (H) 05/20/2015   HDL 45.10 05/20/2015   LDLDIRECT 134.9 05/03/2013   LDLCALC 80 05/20/2015   ALT 12 (L) 07/07/2015   AST 13 (L) 07/07/2015   NA 141 07/07/2015   K 4.3 07/07/2015   CL 109 07/07/2015   CREATININE 1.03 07/07/2015   BUN 17 07/07/2015   CO2 28 07/07/2015   TSH  1.704 06/23/2015   PSA 0.07 (L) 05/20/2015   INR 1.10 06/23/2015   HGBA1C 6.1 (H) 03/21/2015   Other Studies Reviewed Today:  Echocardiogram 04/08/2016 - Left ventricle: The cavity size was normal. There was moderate   asymmetric septal hypertrophy. Systolic function was normal. The   estimated ejection fraction was in the range of 60% to 65%. Wall   motion was normal; there were no regional wall motion   abnormalities. Doppler parameters are consistent with abnormal   left ventricular relaxation (grade 1 diastolic dysfunction). No   LV outflow tract gradient was measured. - Aortic valve: Bioprosthetic aortic valve s/p transcatheter aortic   valve replacement. The TAVR valve appeared to function normally.   There was no significant regurgitation. Mean gradient (S): 11 mm   Hg. - Mitral valve: Mild systolic anterior motion of the anterior   mitral leaflet. - Left atrium: The atrium was mildly dilated. - Right ventricle: The cavity size was normal. Systolic function   was normal. - Right atrium: The atrium was mildly dilated. - Pulmonary arteries: No complete TR doppler jet so unable to   estimate PA systolic pressure. - Inferior vena cava: The vessel was normal in size. The   respirophasic diameter changes were in the normal range (>= 50%),   consistent with normal central venous pressure.  Impressions:  - Normal LV size with moderate asymmetric septal hypertrophy. EF   60-65%. Mild systolic anterior motion of the mitral valve. No LV   outflow tract gradient measured. Consistent with hypertrophic   cardiomyopathy. Normal RV size and systolic function. s/p TAVR,   valve appears to function normally.   EKG performed today in 02/12/2016 shows atrial paced rhythm with left axis deviation and left bundle branch block.    Assessment/Plan:  1. H/o Severe AS, s/p TAVR, he is doing great, normal transaortic gradients with mean of 11 mmHg on most recent echocardiogram done on  04/08/2016.   2. Orthostatic hypotension, the patient is advised to stay hydrated and no prior episodes of syncope, new diagnosed A. fib I would rather not decrease the  dose of metoprolol but if necessary we will do it in the future. While in the clinic we performed pacemaker interrogation to determine if there was an episode of atrial fibrillation or other arrhythmia while he felt dizzy on Saturday however there was no arrhythmia detected.  3. HCM - on BB, tolerating well now post PM placement, BP borderline, unable to uptitrate, no dizziness, syncope  4. Symptomatic 2:1 bloct - s/p PM placement, functioning well  5. New diagnoses of paroxysmal atrial fibrillation with history of TIA, CHADS-VASc is 6, I had a long discussion regarding benefits and risk of full anticoagulation currently he remains fully active and his risk of stroke a currently outweighs risk of bleeding we'll start Eliquis 5 mg by mouth twice a day he is advised to call us with any signs of bleeding.  6. Essential HTN - controlled, and rather orthostatic hypotension however we history of HCM he needs to be on beta blocker will continue.  7. Muscle weakness and pain - discontinue pravastatin, start fish oil only, his LDL was well controlled triglyceride mildly elevated, however patient is 89 and on last year cardiac He had only minimal nonobstructive CAD.  Follow up in 3 months  Signed: Ena Dawley, MD  04/12/2016 10:19 AM  [place] [address] [place], [place]  [address] Phone: [phone] Fax: [phone]

## 2016-04-15 NOTE — Telephone Encounter (Signed)
Pt and wife are both calling to follow-up with Prior Auth Nurse, in regards to the pts Eliquis.  Wife states she just wanted to see where we were at with getting the pts Eliquis approved.  Informed the pt and wife that Vaughan Basta is out of the office today, but I will route this message to her for further review and recommendation and have her follow-up with both parties thereafter. Did advise the pt that per Dr Meda Coffee, he should continue his previous regimen of Plavix, until Eliquis is approved by his insurance plan. Both verbalized understanding and agrees with this plan.

## 2016-04-15 NOTE — Telephone Encounter (Signed)
F/U  Patient is following up on the issue he is having with his Eliquis

## 2016-04-16 NOTE — Telephone Encounter (Signed)
Spoke with patient to let him know that I am faxing the appeal for Eliquis today. It will probably take at least a week for a decision. I will let him know as soon as I do. In the meantime, he knows to stay on all his medications.

## 2016-04-21 NOTE — Telephone Encounter (Signed)
Will follow-up with Jenean Lindau upon return to the office to submit missed questions on pts appeal form for Eliquis, and contact [place] at [facility] during appropriate work hours.

## 2016-04-21 NOTE — Telephone Encounter (Signed)
Follow Up:     One question was missed on the form that was faxed over.Wants to know if the pt have the diagnosis of Myocardial Infarction and Thrombus?Please call before 3:00 today if possible

## 2016-04-22 NOTE — Telephone Encounter (Signed)
This form was faxed back to [facility] on Tuesday 04/20/2016.

## 2016-04-27 ENCOUNTER — Telehealth: Payer: Self-pay | Admitting: Cardiology

## 2016-04-27 NOTE — Telephone Encounter (Signed)
Will forward this message to [facility] in [place] to review and follow-up with this pt about his appeal.  Vaughan Basta is working with this pt on filing an appeal through his insurance company.  Linda to follow-up with the pt about the appeal process.

## 2016-04-27 NOTE — Telephone Encounter (Signed)
New message   Pt calling to find out the results of the appeal from the insurance company. Please call.

## 2016-04-29 ENCOUNTER — Other Ambulatory Visit: Payer: Self-pay

## 2016-04-29 DIAGNOSIS — I480 Paroxysmal atrial fibrillation: Secondary | ICD-10-CM

## 2016-04-29 MED ORDER — ASPIRIN EC 81 MG PO TBEC: 81.0000 mg | DELAYED_RELEASE_TABLET | Freq: Every day | ORAL | 3 refills | Status: DC

## 2016-04-29 NOTE — Telephone Encounter (Signed)
Apparently, patient received a letter from his Insurance Co already. He states that they have denied the appeal for Eliquis. He would like to know his next course of action. Continues on Plavix.

## 2016-04-29 NOTE — Telephone Encounter (Signed)
Patient informed of this decision from Dr. Meda Coffee. He will continue Plavix and ASA 81mg  daily.

## 2016-04-29 NOTE — Telephone Encounter (Signed)
With all of the hassle of getting it approved and only  One episode of atrial fibrillation of the monitor I would decide not to anticoagulate and continue plavix and asa 81 mg po daily.

## 2016-04-30 MED ORDER — CLOPIDOGREL BISULFATE 75 MG PO TABS: 75.0000 mg | ORAL_TABLET | Freq: Every day | ORAL | 3 refills | Status: DC

## 2016-04-30 NOTE — Telephone Encounter (Signed)
Updated pts medication list, as Dr Meda Coffee ordered.  Pt to be discontinued from Eliquis, due to multiple denials from his insurance company.  Pts has filed multiple appeals to receive Eliquis, to his insurance carrier.  Pt has been denied multiple times.  Per Dr Meda Coffee, the pt needs to start back on his Plavix 75 mg po daily, and also continue his ASA 81 mg po daily as well.  Pt made aware of this change per Dr Meda Coffee, by prior auth LPN.  Pt agreed to the plan.  New prescription for Plavix was sent to the pts confirmed pharmacy of choice, with refills.  Pt to follow-up in the clinic with Dr Meda Coffee on 06/16/16.  Pt to come into lab for a cbc and cmet on 9/25. Pt is aware of upcoming appts.

## 2016-04-30 NOTE — Addendum Note (Signed)
Addended by: Nuala Alpha on: 04/30/2016 08:28 AM   Modules accepted: Orders

## 2016-05-10 ENCOUNTER — Other Ambulatory Visit: Payer: Medicare Other | Admitting: *Deleted

## 2016-05-10 DIAGNOSIS — I309 Acute pericarditis, unspecified: Secondary | ICD-10-CM | POA: Diagnosis not present

## 2016-05-10 DIAGNOSIS — I10 Essential (primary) hypertension: Secondary | ICD-10-CM | POA: Diagnosis not present

## 2016-05-10 LAB — COMPREHENSIVE METABOLIC PANEL
ALT: 13 U/L (ref 9–46)
AST: 15 U/L (ref 10–35)
Albumin: 3.9 g/dL (ref 3.6–5.1)
Alkaline Phosphatase: 54 U/L (ref 40–115)
BUN: 24 mg/dL (ref 7–25)
CO2: 27 mmol/L (ref 20–31)
Calcium: 9.8 mg/dL (ref 8.6–10.3)
Chloride: 105 mmol/L (ref 98–110)
Creat: 1.09 mg/dL (ref 0.70–1.11)
Glucose, Bld: 85 mg/dL (ref 65–99)
Potassium: 4.5 mmol/L (ref 3.5–5.3)
Sodium: 141 mmol/L (ref 135–146)
Total Bilirubin: 0.6 mg/dL (ref 0.2–1.2)
Total Protein: 6.1 g/dL (ref 6.1–8.1)

## 2016-05-10 LAB — CBC
HCT: 45.3 % (ref 38.5–50.0)
Hemoglobin: 15.3 g/dL (ref 13.2–17.1)
MCH: 30.2 pg (ref 27.0–33.0)
MCHC: 33.8 g/dL (ref 32.0–36.0)
MCV: 89.5 fL (ref 80.0–100.0)
MPV: 9.8 fL (ref 7.5–12.5)
Platelets: 173 10*3/uL (ref 140–400)
RBC: 5.06 MIL/uL (ref 4.20–5.80)
RDW: 13.6 % (ref 11.0–15.0)
WBC: 6.1 10*3/uL (ref 3.8–10.8)

## 2016-06-12 ENCOUNTER — Other Ambulatory Visit: Payer: Self-pay | Admitting: Family Medicine

## 2016-06-14 ENCOUNTER — Ambulatory Visit: Payer: Medicare Other | Admitting: Adult Health

## 2016-06-15 ENCOUNTER — Ambulatory Visit (INDEPENDENT_AMBULATORY_CARE_PROVIDER_SITE_OTHER): Payer: Medicare Other | Admitting: Adult Health

## 2016-06-15 ENCOUNTER — Encounter: Payer: Self-pay | Admitting: Adult Health

## 2016-06-15 VITALS — BP 110/54 | Temp 98.1°F | Ht 69.0 in | Wt 218.8 lb

## 2016-06-15 DIAGNOSIS — I10 Essential (primary) hypertension: Secondary | ICD-10-CM

## 2016-06-15 DIAGNOSIS — E785 Hyperlipidemia, unspecified: Secondary | ICD-10-CM | POA: Diagnosis not present

## 2016-06-15 DIAGNOSIS — Z7689 Persons encountering health services in other specified circumstances: Secondary | ICD-10-CM | POA: Diagnosis not present

## 2016-06-15 NOTE — Patient Instructions (Signed)
It was great meeting you today!  Please follow up with me for your physical  Continue to stay active and eat healthy   Let me know if you need anything.

## 2016-06-15 NOTE — Progress Notes (Signed)
Patient presents to clinic today to establish care. 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  His last physical was in 05/2015 with MD Tawanna Cooler.   He reports having his flu shot [facility]  Acute Concerns: Establish Care   Chronic Issues: Hypertension  - Is controlled   Hyperlipidemia  - he takes fish oil   Health Maintenance: Dental --Routine  Vision -- Routine  Immunizations -- UTD  Colonoscopy -- No longer needs Diet: He tries to eat healthy  Exercise: He exercises three days a week. 20 minutes on stationary bike. 15 minutes on eliptical and sit ups.   Is followed by   Cardiology - Dr. Delton See  Cardiology - Dr. Ladona Ridgel  Dermatology - Dr. Yetta Barre Neurology - Dr. Karel Jarvis Urology  Audiology    Past Medical History:  Diagnosis Date  . Atrial fibrillation (HCC)    a. dx after lead revision 06/26/15 >> anticoag not started due to recent pericardial effusion in setting of perforation and lead revision  . Bradycardia    a. HR 30s in clinic 06/2015 - BB discontinued.  . DEGENERATIVE JOINT DISEASE, SPINE   . ERECTILE DYSFUNCTION   . Essential hypertension   . First degree AV block   . HEARING LOSS   . Heart block    s/p Pacemaker  . Hyperlipidemia   . Hypertrophic obstructive cardiomyopathy (HCC)   . LBBB (left bundle branch block)   . OA (osteoarthritis)    both hips  .  Pericardial effusion    a. micorpeforation s/p lead revision 06/26/15  . Presence of permanent cardiac pacemaker 06/24/2015   St Jude Assurity  . Prostate cancer [facility]) 2010   "external radiation; 40 treatments"  . 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 Severe aortic stenosis    a. s/p TAVR 03/2015. Pre-op  LHC 02/2015: minor nonobstructive CAD.  Marland Kitchen Squamous cell skin cancer    left lower leg  . Stroke (HCC) 02/19/2014   Small infarct high posterior left frontal lobe on MRI   . TIA (transient ischemic attack) 01/21/2014   5 min spell aphasia without assoc sx      Past Surgical History:  Procedure Laterality Date  . ANTERIOR CERVICAL DECOMP/DISCECTOMY FUSION  ~ 2000   Dr. Otelia Sergeant  . BACK SURGERY    . CARDIAC CATHETERIZATION N/A 02/20/2015   Procedure: Right/Left Heart Cath and Coronary Angiography;  Surgeon: Tonny Bollman, MD;  Location: [facility] INVASIVE CV LAB;  Service: Cardiovascular;  Laterality: N/A;  . CARDIAC VALVE REPLACEMENT    . CATARACT EXTRACTION W/ INTRAOCULAR LENS  IMPLANT, BILATERAL  2004; 2010   right; left  . EP IMPLANTABLE DEVICE N/A 06/24/2015   Procedure: Pacemaker Implant;  Surgeon: Marinus Maw, MD;  Location: [facility] INVASIVE CV LAB;  Service: Cardiovascular; St Jude Assurity   . EP IMPLANTABLE DEVICE N/A 06/26/2015   Procedure: Lead Revision/Repair;  Surgeon: Will Jorja Loa, MD;  Location: [facility] INVASIVE  CV LAB;  Service: Cardiovascular;  Laterality: N/A;  . INSERT / REPLACE / REMOVE PACEMAKER  06/24/2015  . JOINT REPLACEMENT    . SQUAMOUS CELL CARCINOMA EXCISION Left X 3   "lower leg"  . TEE WITHOUT CARDIOVERSION N/A 03/07/2015   Procedure: TRANSESOPHAGEAL ECHOCARDIOGRAM (TEE);  Surgeon: Lars Masson, MD;  Location: [facility] ENDOSCOPY;  Service: Cardiovascular;  Laterality: N/A;  . TEE WITHOUT CARDIOVERSION N/A 03/25/2015   Procedure: TRANSESOPHAGEAL  ECHOCARDIOGRAM (TEE);  Surgeon: Kathleene Hazel, MD;  Location: [facility] OR;  Service: Open Heart Surgery;  Laterality: N/A;  . TONSILLECTOMY    . TOTAL SHOULDER ARTHROPLASTY Right 2007   "wore it out playing tennis"  . TRANSCATHETER AORTIC VALVE REPLACEMENT, TRANSFEMORAL N/A 03/25/2015   Procedure: TRANSCATHETER AORTIC VALVE REPLACEMENT, TRANSFEMORAL;  Surgeon: Kathleene Hazel, MD;  Location[facility] OR;  Service: Open Heart Surgery;  Laterality: N/A;    Current Outpatient Prescriptions on File Prior to Visit  Medication Sig Dispense Refill  . acetaminophen (TYLENOL) 325 MG tablet Take 1-2 tablets (325-650 mg total) by mouth every 4 (four) hours as needed for mild pain.    Marland Kitchen aspirin EC 81 MG tablet Take 1 tablet (81 mg total) by mouth daily. 90 tablet 3  . clopidogrel (PLAVIX) 75 MG tablet Take 1 tablet (75 mg total) by mouth daily. 90 tablet 3  . docusate sodium (COLACE) 100 MG capsule Take 300 mg by mouth at bedtime.     . metoprolol tartrate (LOPRESSOR) 25 MG tablet Take 0.5 tablets (12.5 mg total) by mouth 2 (two) times daily.    . Omega-3 Fatty Acids (FISH OIL) 1000 MG CAPS Take 1,000 mg by mouth 2 (two) times daily.    . tamsulosin (FLOMAX) 0.4 MG CAPS capsule Take 0.4 mg by mouth 3 (three) times a week. Monday, Wednesday, Friday     No current facility-administered medications on file prior to visit.     No Known Allergies  Family History  Problem Relation Age of Onset  . Heart disease Mother   . CVA Mother   . Stroke Mother   . Cancer Father   . CVA Father   . Cancer Sister   . Prostate cancer Brother   . Heart disease Brother   . Lung cancer Brother   . Heart attack Brother   . Hypertension Neg Hx     Social History   Social History  . Marital status: Married    Spouse name: N/A  . Number of children: N/A  . Years of education: N/A   Occupational History  . Retired    Social History Main Topics  . Smoking status: Never Smoker  . Smokeless tobacco: Never  Used  . Alcohol use No  . Drug use: No  . Sexual activity: Not Currently   Other Topics Concern  . Not on file   Social History Narrative   Patient lives in [place] with his wife.    Review of Systems  Constitutional: Negative.   HENT: Positive for hearing loss.   Eyes: Negative.   Respiratory: Negative.   Cardiovascular: Negative.   Gastrointestinal: Negative.   Genitourinary: Negative.   Musculoskeletal: Positive for back pain and joint pain (bilateral hips- chronic ).  Skin: Negative.   Neurological: Negative.   Endo/Heme/Allergies: Negative.   Psychiatric/Behavioral: Negative.   All other systems reviewed and are negative.   BP (!) 110/54   Temp 98.1 F (36.7 C) (Oral)   Ht 5\' 9"  (1.753 m)   Wt  218 lb 12.8 oz (99.2 kg)   BMI 32.31 kg/m   Physical Exam  Constitutional: He is oriented to person, place, and time and well-developed, well-nourished, and in no distress. No distress.  Cardiovascular: Regular rhythm, normal heart sounds and intact distal pulses.  Bradycardia present.  Exam reveals no gallop and no friction rub.   No murmur heard. Pulmonary/Chest: Effort normal and breath sounds normal. No respiratory distress. He has no wheezes. He has no rales. He exhibits no tenderness.  Musculoskeletal: Normal range of motion. He exhibits no edema, tenderness or deformity.  Walks with a rolling walker  Neurological: He is alert and oriented to person, place, and time. Gait normal. GCS score is 15.  Skin: Skin is warm and dry. No rash noted. He is not diaphoretic. No erythema. No pallor.  Psychiatric: Mood, memory, affect and judgment normal.  Nursing note and vitals reviewed.   Recent Results (from the past 2160 hour(s))  Implantable device - remote     Status: None   Collection Time: 03/25/16  6:00 AM  Result Value Ref Range   Date Time Interrogation Session [id number]    Pulse Generator Manufacturer [employer]    Pulse Gen Model 2240 Assurity    Pulse Gen  Serial Number [id number]    Implantable Pulse Generator Type Implantable Pulse Generator    Implantable Pulse Generator Implant Date [id number]    Implantable Lead Manufacturer [facility]    Implantable Lead Model [phone] Tendril STS    Implantable Lead Serial Number [id number]    Implantable Lead Implant Date [id number]    Implantable Lead Location [id number]    Implantable Lead Manufacturer [facility]    Implantable Lead Model [phone] Tendril STS    Implantable Lead Serial Number [id number]    Implantable Lead Implant Date [id number]    Implantable Lead Location [id number]    Lead Channel Setting Sensing Sensitivity 5.0 mV   Lead Channel Setting Sensing Adaptation Mode Fixed Pacing    Lead Channel Setting Pacing Amplitude 2.0 V   Lead Channel Setting Pacing Pulse Width 0.5 ms   Lead Channel Setting Pacing Amplitude 0.875    Lead Channel Status NULL    Lead Channel Impedance Value 410 ohm   Lead Channel Sensing Intrinsic Amplitude 5.0 mV   Lead Channel Pacing Threshold Amplitude 0.75 V   Lead Channel Pacing Threshold Pulse Width 0.5 ms   Lead Channel Status NULL    Lead Channel Impedance Value 460 ohm   Lead Channel Sensing Intrinsic Amplitude 12.0 mV   Lead Channel Pacing Threshold Amplitude 0.625 V   Lead Channel Pacing Threshold Pulse Width 0.5 ms   Battery Status MOS    Battery Remaining Longevity 111 mo   Battery Remaining Percentage 95.5 %   Battery Voltage 2.98 V   Brady Statistic RA Percent Paced 61.0 %   Brady Statistic RV Percent Paced 99.0 %   Brady Statistic AP VP Percent 62.0 %   Brady Statistic AS VP Percent 37.0 %   Brady Statistic AP VS Percent 1.0 %   Brady Statistic AS VS Percent 1.0 %   Eval Rhythm ApVp   CBC     Status: None   Collection Time: 05/10/16 10:23 AM  Result Value Ref Range   WBC 6.1 3.8 - 10.8 K/uL   RBC 5.06 4.20 - 5.80 MIL/uL   Hemoglobin 15.3 13.2 - 17.1 g/dL   HCT 10.2 72.5 - 36.6 %   MCV 89.5 80.0 - 100.0 fL   MCH 30.2 27.0 -  33.0 pg   MCHC 33.8 32.0 -  36.0 g/dL   RDW 32.4 40.1 - 02.7 %   Platelets 173 140 - 400 K/uL   MPV 9.8 7.5 - 12.5 fL  Comp Met (CMET)     Status: None   Collection Time: 05/10/16 10:23 AM  Result Value Ref Range   Sodium 141 135 - 146 mmol/L   Potassium 4.5 3.5 - 5.3 mmol/L   Chloride 105 98 - 110 mmol/L   CO2 27 20 - 31 mmol/L   Glucose, Bld 85 65 - 99 mg/dL   BUN 24 7 - 25 mg/dL   Creat 2.53 6.64 - 4.03 mg/dL    Comment:   For patients > or = 80 years of age: The upper reference limit for Creatinine is approximately 13% higher for people identified as African-American.      Total Bilirubin 0.6 0.2 - 1.2 mg/dL   Alkaline Phosphatase 54 40 - 115 U/L   AST 15 10 - 35 U/L   ALT 13 9 - 46 U/L   Total Protein 6.1 6.1 - 8.1 g/dL   Albumin 3.9 3.6 - 5.1 g/dL   Calcium 9.8 8.6 - 47.4 mg/dL    Assessment/Plan:  1. Encounter to establish care - Follow up for CPE - Continue to stay active and exercise  2. Essential hypertension - Controlled on Metoprolol   3. Hyperlipidemia, unspecified hyperlipidemia type - continue with fish oil - Will check lipid panel at CPE  Shirline Frees, NP

## 2016-06-16 ENCOUNTER — Ambulatory Visit (INDEPENDENT_AMBULATORY_CARE_PROVIDER_SITE_OTHER): Payer: Medicare Other | Admitting: Cardiology

## 2016-06-16 VITALS — BP 126/70 | HR 60 | Ht 69.0 in | Wt 217.0 lb

## 2016-06-16 DIAGNOSIS — I480 Paroxysmal atrial fibrillation: Secondary | ICD-10-CM

## 2016-06-16 DIAGNOSIS — Z952 Presence of prosthetic heart valve: Secondary | ICD-10-CM | POA: Diagnosis not present

## 2016-06-16 DIAGNOSIS — I2510 Atherosclerotic heart disease of native coronary artery without angina pectoris: Secondary | ICD-10-CM

## 2016-06-16 DIAGNOSIS — I442 Atrioventricular block, complete: Secondary | ICD-10-CM | POA: Diagnosis not present

## 2016-06-16 DIAGNOSIS — I10 Essential (primary) hypertension: Secondary | ICD-10-CM

## 2016-06-16 DIAGNOSIS — I421 Obstructive hypertrophic cardiomyopathy: Secondary | ICD-10-CM

## 2016-06-16 DIAGNOSIS — Z950 Presence of cardiac pacemaker: Secondary | ICD-10-CM | POA: Diagnosis not present

## 2016-06-16 NOTE — Patient Instructions (Signed)
**Note De-Identified  Obfuscation** Medication Instructions:  Same-no changes  Labwork: None  Testing/Procedures: None  Follow-Up: In 3 months     If you need a refill on your cardiac medications before your next appointment, please call your pharmacy.

## 2016-06-16 NOTE — Progress Notes (Signed)
Patient ID: Robert Anderson, male   DOB: July 30, 1924, 82 y.o.   MRN: [id number]    CARDIOLOGY OFFICE NOTE  Date:  06/16/2016   Robert Anderson Date of Birth: September 15, 1923 Medical Record [id number]  PCP:  Joycelyn Man, MD  Cardiologist:  Meda Coffee    Chief complain: 3 month follow up  History of Present Illness: Robert Anderson is a 82 y.o. male who presents today for a follow up visit.  He has a history of severe AS s/p TAVR 03/25/15, HOCM, LBBB, 1st degree AVB, essential HTN, h/o TIA & CVA.  LHC 02/2015: minor nonobstructive CAD. 2D echo 04/25/15: vigorous LV systolic function with near-cavity obliteration, moderate LVH, EF 65-70%, grade 1 DD, mod LAE. His post op course was complicated by symptomatic 2:1 block, s/p PM placement.  He presented to the ER on 11/21 with chest pain and was diagnosed with and acute pericarditis, started on Colchicine, ibuprofen, symptoms of chest pain are now resolved. He is off all medicines. He saw Dr. Lovena Le in December for follow-up for pacemaker placement and repositioning of the lead that Dislodged, the pacemakers working properly.  06/16/2016- in August after PM interrogation there was PAF with one of the episodes lasting or 4 hours. Since he has a prior history of TIA the brought him in to discuss anticoagulation. Since his CHADS-VASc is 6 we decided to start Eliquis. This was not approved by his insurance as he has a bioprosthetic valve. Considering his age, we decided against Coumadin and frequent lab work. His A-fib never lasted > 48 hours.   Today he states that he has been feeling great, he continues to visit [employer] and exercises daily without any significant symptoms. No LE edema, no orthopnea, no palpitations, no chest pain or syncope.  Past Medical History:  Diagnosis Date  . Atrial fibrillation ([place])    a. dx after lead revision 06/26/15 >> anticoag not started due to recent pericardial effusion in setting of perforation and lead revision  . Bradycardia    a. HR 30s in  clinic 06/2015 - BB discontinued.  . DEGENERATIVE JOINT DISEASE, SPINE   . ERECTILE DYSFUNCTION   . Essential hypertension   . First degree AV block   . HEARING LOSS   . Heart block    s/p Pacemaker  . Hyperlipidemia   . Hypertrophic obstructive cardiomyopathy ([place])   . LBBB (left bundle branch block)   . OA (osteoarthritis)    both hips  . Pericardial effusion    a. micorpeforation s/p lead revision 06/26/15  . Presence of permanent cardiac pacemaker 06/24/2015   St Jude Assurity  . Prostate cancer [facility]) 2010   "external radiation; 40 treatments"  . 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 Severe aortic stenosis    a. s/p TAVR 03/2015. Pre-op  LHC 02/2015: minor nonobstructive CAD.  Marland Kitchen Squamous cell skin cancer    left lower leg  . Stroke ([place]) 02/19/2014   Small infarct high posterior left frontal lobe on MRI   . TIA (transient ischemic attack) 01/21/2014   5 min spell aphasia without assoc sx     Past Surgical History:  Procedure Laterality Date  . ANTERIOR CERVICAL DECOMP/DISCECTOMY FUSION  ~ 2000   Dr. Louanne Skye  . BACK SURGERY    . CARDIAC CATHETERIZATION N/A 02/20/2015   Procedure: Right/Left Heart Cath and Coronary Angiography;  Surgeon: Sherren Mocha, MD;  Location: [place] CV LAB;  Service: Cardiovascular;  Laterality: N/A;  . CARDIAC VALVE REPLACEMENT    . CATARACT EXTRACTION W/ INTRAOCULAR LENS  IMPLANT, BILATERAL  2004; 2010   right; left  . EP IMPLANTABLE DEVICE N/A 06/24/2015   Procedure: Pacemaker Implant;  Surgeon: Evans Lance, MD;  Location: [place] CV LAB;  Service: Cardiovascular; St Jude Assurity   . EP IMPLANTABLE DEVICE N/A 06/26/2015   Procedure: Lead Revision/Repair;  Surgeon: Will Meredith Leeds, MD;  Location: [place] CV LAB;  Service: Cardiovascular;  Laterality: N/A;  . INSERT / REPLACE / REMOVE PACEMAKER  06/24/2015  . JOINT  REPLACEMENT    . SQUAMOUS CELL CARCINOMA EXCISION Left X 3   "lower leg"  . TEE WITHOUT CARDIOVERSION N/A 03/07/2015   Procedure: TRANSESOPHAGEAL ECHOCARDIOGRAM (TEE);  Surgeon: Dorothy Spark, MD;  Location: [place];  Service: Cardiovascular;  Laterality: N/A;  . TEE WITHOUT CARDIOVERSION N/A 03/25/2015   Procedure: TRANSESOPHAGEAL ECHOCARDIOGRAM (TEE);  Surgeon: Burnell Blanks, MD;  Location: [place];  Service: Open Heart Surgery;  Laterality: N/A;  . TONSILLECTOMY    . TOTAL SHOULDER ARTHROPLASTY Right 2007   "wore it out playing tennis"  . TRANSCATHETER AORTIC VALVE REPLACEMENT, TRANSFEMORAL N/A 03/25/2015   Procedure: TRANSCATHETER AORTIC VALVE REPLACEMENT, TRANSFEMORAL;  Surgeon: Burnell Blanks, MD;  Location: [place];  Service: Open Heart Surgery;  Laterality: N/A;   Medications: Current Outpatient Prescriptions  Medication Sig Dispense Refill  . acetaminophen (TYLENOL) 325 MG tablet Take 1-2 tablets (325-650 mg total) by mouth every 4 (four) hours as needed for mild pain.    Marland Kitchen aspirin EC 81 MG tablet Take 1 tablet (81 mg total) by mouth daily. 90 tablet 3  . clopidogrel (PLAVIX) 75 MG tablet Take 1 tablet (75 mg total) by mouth daily. 90 tablet 3  . docusate sodium (COLACE) 100 MG capsule Take 300 mg by mouth at bedtime.     . metoprolol tartrate (LOPRESSOR) 25 MG tablet Take 0.5 tablets (12.5 mg total) by mouth 2 (two) times daily.    . Omega-3 Fatty Acids (FISH OIL) 1000 MG CAPS Take 1,000 mg by mouth 2 (two) times daily.    . tamsulosin (FLOMAX) 0.4 MG CAPS capsule Take 0.4 mg by mouth 3 (three) times a week. Monday, Wednesday, Friday     No current facility-administered medications for this visit.    Allergies: No Known Allergies  Social History: The patient  reports that he has never smoked. He has never used smokeless tobacco. He reports that he does not drink alcohol or use drugs.   Family History: The patient's family history includes Brain cancer in  his sister; CVA in his father and mother; Cancer in his father; Heart attack in his brother; Heart disease in his brother and mother; Lung cancer in his brother; Prostate cancer in his brother; Stroke in his mother.   Review of Systems: Please see the history of present illness.   Otherwise, the review of systems is positive for none.   All other systems are reviewed and negative.   Physical Exam: VS:  BP 126/70   Pulse 60   Ht 5\' 9"  (1.753 m)   Wt 217 lb (98.4 kg)   BMI 32.05 kg/m  .  BMI Body mass index is 32.05 kg/m.  Wt Readings from Last 3 Encounters:  06/16/16 217 lb (98.4 kg)  06/15/16 218 lb 12.8 oz (99.2 kg)  04/12/16 220 lb (99.8 kg)   General: Pleasant. Elderly  male - looks chronically ill but in no acute distress. Left arm dressing in place.   HEENT: Normal.  Neck: Supple, no JVD, carotid bruits, or masses noted.  Cardiac: RRR, no murmur, no rub, healed scar post PM placement, no discharge Respiratory:  Lungs are clear to auscultation bilaterally with normal work of breathing.  GI: Soft and nontender.  MS: No deformity or atrophy. Gait and ROM intact. Using a walker.   Skin: Warm and dry. Color is normal.  Neuro:  Strength and sensation are intact and no gross focal deficits noted.  Psych: Alert, appropriate and with normal affect.  LABORATORY DATA:  EKG:  EKG is ordered today. This demonstrates complete heart block - rate of 40 - reviewed with Dr. Lovena Le here in the office.  Lab Results  Component Value Date   WBC 6.1 05/10/2016   HGB 15.3 05/10/2016   HCT 45.3 05/10/2016   PLT 173 05/10/2016   GLUCOSE 85 05/10/2016   CHOL 158 05/20/2015   TRIG 161.0 (H) 05/20/2015   HDL 45.10 05/20/2015   LDLDIRECT 134.9 05/03/2013   LDLCALC 80 05/20/2015   ALT 13 05/10/2016   AST 15 05/10/2016   NA 141 05/10/2016   K 4.5 05/10/2016   CL 105 05/10/2016   CREATININE 1.09 05/10/2016   BUN 24 05/10/2016   CO2 27 05/10/2016   TSH 1.704 06/23/2015   PSA 0.07 (L)  05/20/2015   INR 1.10 06/23/2015   HGBA1C 6.1 (H) 03/21/2015   Other Studies Reviewed Today:  Echocardiogram 04/08/2016 - Left ventricle: The cavity size was normal. There was moderate   asymmetric septal hypertrophy. Systolic function was normal. The   estimated ejection fraction was in the range of 60% to 65%. Wall   motion was normal; there were no regional wall motion   abnormalities. Doppler parameters are consistent with abnormal   left ventricular relaxation (grade 1 diastolic dysfunction). No   LV outflow tract gradient was measured. - Aortic valve: Bioprosthetic aortic valve s/p transcatheter aortic   valve replacement. The TAVR valve appeared to function normally.   There was no significant regurgitation. Mean gradient (S): 11 mm   Hg. - Mitral valve: Mild systolic anterior motion of the anterior   mitral leaflet. - Left atrium: The atrium was mildly dilated. - Right ventricle: The cavity size was normal. Systolic function   was normal. - Right atrium: The atrium was mildly dilated. - Pulmonary arteries: No complete TR doppler jet so unable to   estimate PA systolic pressure. - Inferior vena cava: The vessel was normal in size. The   respirophasic diameter changes were in the normal range (>= 50%),   consistent with normal central venous pressure.  Impressions:  - Normal LV size with moderate asymmetric septal hypertrophy. EF   60-65%. Mild systolic anterior motion of the mitral valve. No LV   outflow tract gradient measured. Consistent with hypertrophic   cardiomyopathy. Normal RV size and systolic function. s/p TAVR,   valve appears to function normally.   EKG performed today in 02/12/2016 shows atrial paced rhythm with left axis deviation and left bundle branch block.    Assessment/Plan:  1. H/o Severe AS, s/p TAVR, he is doing great, normal transaortic gradients with mean of 11 mmHg on most recent echocardiogram done on 04/08/2016. Doing great, continues  to exercise.  2. Orthostatic hypotension - resolved with hydration.  3. HCM - on BB, tolerating well now post PM placement, BP borderline, unable to uptitrate, no dizziness, syncope  4. Symptomatic 2:1 bloct - s/p PM placement, functioning well  5. PAF - asymptomatic, found on PM interrogation, ASA and Plavix only.  6. Essential HTN - controlled, and rather orthostatic hypotension however we history of HCM he needs to be on beta blocker will continue.  7. Muscle weakness and pain - we discontinued pravastatin, he still feels weak, however able to go to the gym.   8. HLP - start fish oil only, his LDL was well controlled triglyceride mildly elevated, however patient is 89 and on last year cardiac He had only minimal nonobstructive CAD.  Follow up in 3 months  Signed: Ena Dawley, MD  06/16/2016 12:05 PM  [place] [address] [place], [place]  [address] Phone: [phone] Fax: [phone]

## 2016-06-22 ENCOUNTER — Encounter: Payer: Self-pay | Admitting: Internal Medicine

## 2016-06-22 ENCOUNTER — Ambulatory Visit (INDEPENDENT_AMBULATORY_CARE_PROVIDER_SITE_OTHER): Payer: Medicare Other | Admitting: Internal Medicine

## 2016-06-22 ENCOUNTER — Encounter (INDEPENDENT_AMBULATORY_CARE_PROVIDER_SITE_OTHER): Payer: Self-pay

## 2016-06-22 VITALS — BP 100/52 | HR 70 | Ht 69.0 in | Wt 216.6 lb

## 2016-06-22 DIAGNOSIS — I442 Atrioventricular block, complete: Secondary | ICD-10-CM

## 2016-06-22 DIAGNOSIS — Z950 Presence of cardiac pacemaker: Secondary | ICD-10-CM | POA: Diagnosis not present

## 2016-06-22 MED ORDER — WARFARIN SODIUM 3 MG PO TABS: 3.0000 mg | ORAL_TABLET | Freq: Every day | ORAL | 3 refills | Status: DC

## 2016-06-22 NOTE — Progress Notes (Signed)
HPI Mr. Mcglory returns today for PPM followup. He is a pleasant 82 yo man with aortic stenosis, s/p TAVR, HTN, complete heart block, s/p PPM insertion with lead dislodgement and re-insertion. His symptoms have resolved. He denies chest pain or sob. No edema. No syncope. He has been found to have PAF with the longest episode lasting around 6 hrs. He recently had a TIA/stroke. He was ordered to start an [facility] but was denied due to prior valve surgery. No Known Allergies   Current Outpatient Prescriptions  Medication Sig Dispense Refill  . acetaminophen (TYLENOL) 325 MG tablet Take 1-2 tablets (325-650 mg total) by mouth every 4 (four) hours as needed for mild pain.    Marland Kitchen docusate sodium (COLACE) 100 MG capsule Take 300 mg by mouth at bedtime.     . metoprolol tartrate (LOPRESSOR) 25 MG tablet Take 0.5 tablets (12.5 mg total) by mouth 2 (two) times daily.    . Omega-3 Fatty Acids (FISH OIL) 1000 MG CAPS Take 1,000 mg by mouth 2 (two) times daily.    . tamsulosin (FLOMAX) 0.4 MG CAPS capsule Take 0.4 mg by mouth 3 (three) times a week. Monday, Wednesday, Friday    . warfarin (COUMADIN) 3 MG tablet Take 1 tablet (3 mg total) by mouth daily. 30 tablet 3   No current facility-administered medications for this visit.      Past Medical History:  Diagnosis Date  . Atrial fibrillation ([place])    a. dx after lead revision 06/26/15 >> anticoag not started due to recent pericardial effusion in setting of perforation and lead revision  . Bradycardia    a. HR 30s in clinic 06/2015 - BB discontinued.  . DEGENERATIVE JOINT DISEASE, SPINE   . ERECTILE DYSFUNCTION   . Essential hypertension   . First degree AV block   . HEARING LOSS   . Heart block    s/p Pacemaker  . Hyperlipidemia   . Hypertrophic obstructive cardiomyopathy ([place])   . LBBB (left bundle branch block)   . OA (osteoarthritis)    both hips  . Pericardial effusion    a. micorpeforation s/p lead revision 06/26/15  . Presence of  permanent cardiac pacemaker 06/24/2015   St Jude Assurity  . Prostate cancer [facility]) 2010   "external radiation; 40 treatments"  . 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 Severe aortic stenosis    a. s/p TAVR 03/2015. Pre-op  LHC 02/2015: minor nonobstructive CAD.  Marland Kitchen Squamous cell skin cancer    left lower leg  . Stroke ([place]) 02/19/2014   Small infarct high posterior left frontal lobe on MRI   . TIA (transient ischemic attack) 01/21/2014   5 min spell aphasia without assoc sx      ROS:   All systems reviewed and negative except as noted in the HPI.   Past Surgical History:  Procedure Laterality Date  . ANTERIOR CERVICAL DECOMP/DISCECTOMY FUSION  ~ 2000   Dr. Louanne Skye  . BACK SURGERY    . CARDIAC CATHETERIZATION N/A 02/20/2015   Procedure: Right/Left Heart Cath and Coronary Angiography;  Surgeon: Sherren Mocha, MD;  Location: [place] CV LAB;  Service: Cardiovascular;  Laterality: N/A;  . CARDIAC VALVE REPLACEMENT    . CATARACT EXTRACTION W/ INTRAOCULAR LENS  IMPLANT, BILATERAL  2004; 2010   right; left  . EP IMPLANTABLE DEVICE N/A 06/24/2015   Procedure: Pacemaker  Implant;  Surgeon: Evans Lance, MD;  Location: [place] CV LAB;  Service: Cardiovascular; St Jude Assurity   . EP IMPLANTABLE DEVICE N/A 06/26/2015   Procedure: Lead Revision/Repair;  Surgeon: Will Meredith Leeds, MD;  Location: [place] CV LAB;  Service: Cardiovascular;  Laterality: N/A;  . INSERT / REPLACE / REMOVE PACEMAKER  06/24/2015  . JOINT REPLACEMENT    . SQUAMOUS CELL CARCINOMA EXCISION Left X 3   "lower leg"  . TEE WITHOUT CARDIOVERSION N/A 03/07/2015   Procedure: TRANSESOPHAGEAL ECHOCARDIOGRAM (TEE);  Surgeon: Dorothy Spark, MD;  Location: [place];  Service: Cardiovascular;  Laterality: N/A;  . TEE WITHOUT CARDIOVERSION N/A 03/25/2015   Procedure: TRANSESOPHAGEAL ECHOCARDIOGRAM (TEE);   Surgeon: Burnell Blanks, MD;  Location: [place];  Service: Open Heart Surgery;  Laterality: N/A;  . TONSILLECTOMY    . TOTAL SHOULDER ARTHROPLASTY Right 2007   "wore it out playing tennis"  . TRANSCATHETER AORTIC VALVE REPLACEMENT, TRANSFEMORAL N/A 03/25/2015   Procedure: TRANSCATHETER AORTIC VALVE REPLACEMENT, TRANSFEMORAL;  Surgeon: Burnell Blanks, MD;  Location: [place];  Service: Open Heart Surgery;  Laterality: N/A;     Family History  Problem Relation Age of Onset  . Heart disease Mother   . CVA Mother   . Stroke Mother   . CVA Father   . Cancer Father   . Brain cancer Sister   . Prostate cancer Brother   . Heart disease Brother   . Lung cancer Brother   . Heart attack Brother   . Hypertension Neg Hx      Social History   Social History  . Marital status: Married    Spouse name: N/A  . Number of children: N/A  . Years of education: N/A   Occupational History  . Retired    Social History Main Topics  . Smoking status: Never Smoker  . Smokeless tobacco: Never Used  . Alcohol use No  . Drug use: No  . Sexual activity: Not Currently   Other Topics Concern  . Not on file   Social History Narrative   Patient lives in [place] with his wife. 2nd marriage    4 children    He goes to the [facility] three days a week     BP (!) 100/52   Pulse 70   Ht 5\' 9"  (1.753 m)   Wt 216 lb 9.6 oz (98.2 kg)   SpO2 96%   BMI 31.99 kg/m   Physical Exam:  Well appearing elderly man, NAD HEENT: Unremarkable Neck:  6 cm JVD, no thyromegally Lymphatics:  No adenopathy Back:  No CVA tenderness Lungs:  Clear with no wheezes HEART:  Regular rate rhythm, no murmurs, no rubs, no clicks Abd:  soft, positive bowel sounds, no organomegally, no rebound, no guarding Ext:  2 plus pulses, no edema, no cyanosis, no clubbing Skin:  No rashes no nodules Neuro:  CN II through XII intact, motor grossly intac  DEVICE  Normal device function.  See PaceArt for details.    A/P 1. Complete heart block - he is doing well, s/p PPM insertion 2. PPM - his St. Jude DDD PM is working normally. Will recheck in several months. 3. HTN - his blood pressure is stable.  4. Atrial fib - after an exhaustive discussion with the patient and his wife where all questions were answered, I have recommended a trial of warfarin and stopping his ASA and Plavix. The patient has been counseled about the pro's and con's.  While he is at some increased risk for bleeding, His CHADSVASC score is at least 6. If he has any reason to stop his coumadin then we would.  Discussed with Dr. Meda Coffee. Reesa Gotschall,M.D.  Assess/Plan:

## 2016-06-22 NOTE — Patient Instructions (Addendum)
Medication Instructions:  Your physician recommends that you continue on your current medications as directed. Please refer to the Current Medication list given to you today.  Your physician has recommended you make the following change in your medication:  1) STOP Aspirin and Plavix.  2) 2 days after stopping Aspirin and Plavix, START Warfarin 3 mg daily   Labwork: None Ordered   Testing/Procedures: None Ordered   Follow-Up: Your physician recommends that you schedule a follow-up appointment in: 5-7 days in Coumadin Clinic for INR check and 1 year with Dr. Lovena Le.    Remote monitoring is used to monitor your Pacemaker from home. This monitoring reduces the number of office visits required to check your device to one time per year. It allows [place] to keep an eye on the functioning of your device to ensure it is working properly. You are scheduled for a device check from home on 09/21/16. You may send your transmission at any time that day. If you have a wireless device, the transmission will be sent automatically. After your physician reviews your transmission, you will receive a postcard with your next transmission date.    Any Other Special Instructions Will Be Listed Below (If Applicable).     If you need a refill on your cardiac medications before your next appointment, please call your pharmacy.

## 2016-06-24 LAB — CUP PACEART INCLINIC DEVICE CHECK
Battery Voltage: 2.98 V
Brady Statistic RV Percent Paced: 99.38 %
Implantable Lead Implant Date: 20161108
Implantable Lead Location: 753859
Implantable Pulse Generator Implant Date: 20161108
Lead Channel Impedance Value: 412.5 Ohm
Lead Channel Impedance Value: 462.5 Ohm
Lead Channel Pacing Threshold Amplitude: 0.5 V
Lead Channel Pacing Threshold Amplitude: 0.75 V
Lead Channel Pacing Threshold Pulse Width: 0.5 ms
Lead Channel Sensing Intrinsic Amplitude: 12 mV
Lead Channel Setting Pacing Amplitude: 0.75 V
Lead Channel Setting Pacing Amplitude: 2 V
Lead Channel Setting Sensing Sensitivity: 5 mV
MDC IDC LEAD IMPLANT DT: 20161108
MDC IDC LEAD LOCATION: 753860
MDC IDC MSMT BATTERY REMAINING LONGEVITY: 115 mo
MDC IDC MSMT LEADCHNL RA PACING THRESHOLD AMPLITUDE: 0.75 V
MDC IDC MSMT LEADCHNL RA PACING THRESHOLD PULSEWIDTH: 0.5 ms
MDC IDC MSMT LEADCHNL RA PACING THRESHOLD PULSEWIDTH: 0.5 ms
MDC IDC MSMT LEADCHNL RA SENSING INTR AMPL: 5 mV
MDC IDC MSMT LEADCHNL RV PACING THRESHOLD AMPLITUDE: 0.5 V
MDC IDC MSMT LEADCHNL RV PACING THRESHOLD PULSEWIDTH: 0.5 ms
MDC IDC SESS DTM: 20171107165835
MDC IDC SET LEADCHNL RV PACING PULSEWIDTH: 0.5 ms
MDC IDC STAT BRADY RA PERCENT PACED: 65 %
Pulse Gen Serial Number: 7825693

## 2016-06-29 ENCOUNTER — Ambulatory Visit (INDEPENDENT_AMBULATORY_CARE_PROVIDER_SITE_OTHER): Payer: Medicare Other | Admitting: *Deleted

## 2016-06-29 DIAGNOSIS — Z952 Presence of prosthetic heart valve: Secondary | ICD-10-CM | POA: Diagnosis not present

## 2016-06-29 DIAGNOSIS — I4891 Unspecified atrial fibrillation: Secondary | ICD-10-CM

## 2016-06-29 DIAGNOSIS — Z5181 Encounter for therapeutic drug level monitoring: Secondary | ICD-10-CM | POA: Insufficient documentation

## 2016-06-29 LAB — POCT INR: INR: 1.3

## 2016-06-29 NOTE — Patient Instructions (Signed)

## 2016-07-07 ENCOUNTER — Ambulatory Visit (INDEPENDENT_AMBULATORY_CARE_PROVIDER_SITE_OTHER): Payer: Medicare Other | Admitting: *Deleted

## 2016-07-07 DIAGNOSIS — Z952 Presence of prosthetic heart valve: Secondary | ICD-10-CM

## 2016-07-07 DIAGNOSIS — Z5181 Encounter for therapeutic drug level monitoring: Secondary | ICD-10-CM | POA: Diagnosis not present

## 2016-07-07 DIAGNOSIS — I4891 Unspecified atrial fibrillation: Secondary | ICD-10-CM

## 2016-07-07 LAB — POCT INR: INR: 2.6

## 2016-07-15 ENCOUNTER — Ambulatory Visit (INDEPENDENT_AMBULATORY_CARE_PROVIDER_SITE_OTHER): Payer: Medicare Other | Admitting: *Deleted

## 2016-07-15 DIAGNOSIS — Z952 Presence of prosthetic heart valve: Secondary | ICD-10-CM | POA: Diagnosis not present

## 2016-07-15 DIAGNOSIS — I4891 Unspecified atrial fibrillation: Secondary | ICD-10-CM

## 2016-07-15 DIAGNOSIS — Z5181 Encounter for therapeutic drug level monitoring: Secondary | ICD-10-CM | POA: Diagnosis not present

## 2016-07-15 LAB — POCT INR: INR: 2.6

## 2016-07-23 ENCOUNTER — Ambulatory Visit (INDEPENDENT_AMBULATORY_CARE_PROVIDER_SITE_OTHER): Payer: Medicare Other | Admitting: *Deleted

## 2016-07-23 DIAGNOSIS — Z952 Presence of prosthetic heart valve: Secondary | ICD-10-CM | POA: Diagnosis not present

## 2016-07-23 DIAGNOSIS — Z5181 Encounter for therapeutic drug level monitoring: Secondary | ICD-10-CM

## 2016-07-23 DIAGNOSIS — I4891 Unspecified atrial fibrillation: Secondary | ICD-10-CM

## 2016-07-23 LAB — POCT INR: INR: 2.5

## 2016-07-29 ENCOUNTER — Ambulatory Visit (INDEPENDENT_AMBULATORY_CARE_PROVIDER_SITE_OTHER): Payer: Medicare Other | Admitting: Adult Health

## 2016-07-29 VITALS — BP 102/60 | HR 60 | Temp 97.6°F | Ht 68.5 in | Wt 214.6 lb

## 2016-07-29 DIAGNOSIS — E785 Hyperlipidemia, unspecified: Secondary | ICD-10-CM

## 2016-07-29 DIAGNOSIS — I10 Essential (primary) hypertension: Secondary | ICD-10-CM

## 2016-07-29 DIAGNOSIS — Z0000 Encounter for general adult medical examination without abnormal findings: Secondary | ICD-10-CM

## 2016-07-29 LAB — HEPATIC FUNCTION PANEL
ALBUMIN: 4.1 g/dL (ref 3.5–5.2)
ALK PHOS: 62 U/L (ref 39–117)
ALT: 13 U/L (ref 0–53)
AST: 13 U/L (ref 0–37)
BILIRUBIN DIRECT: 0.1 mg/dL (ref 0.0–0.3)
BILIRUBIN TOTAL: 0.6 mg/dL (ref 0.2–1.2)
Total Protein: 6.2 g/dL (ref 6.0–8.3)

## 2016-07-29 LAB — BASIC METABOLIC PANEL
BUN: 29 mg/dL — AB (ref 6–23)
CALCIUM: 9.4 mg/dL (ref 8.4–10.5)
CO2: 31 meq/L (ref 19–32)
CREATININE: 1.03 mg/dL (ref 0.40–1.50)
Chloride: 106 mEq/L (ref 96–112)
GFR: 72.1 mL/min (ref >60.00)
GLUCOSE: 105 mg/dL — AB (ref 70–99)
Potassium: 4.2 mEq/L (ref 3.5–5.1)
Sodium: 141 mEq/L (ref 135–145)

## 2016-07-29 LAB — LIPID PANEL
Cholesterol: 210 mg/dL — ABNORMAL HIGH (ref 0–200)
HDL: 40.3 mg/dL (ref >39.00)
LDL CALC: 136 mg/dL — AB (ref 0–99)
NONHDL: 169.86
Total CHOL/HDL Ratio: 5
Triglycerides: 171 mg/dL — ABNORMAL HIGH (ref 0.0–149.0)
VLDL: 34.2 mg/dL (ref 0.0–40.0)

## 2016-07-29 LAB — PSA: PSA: 0.07 ng/mL — ABNORMAL LOW (ref 0.10–4.00)

## 2016-07-29 LAB — POC URINALSYSI DIPSTICK (AUTOMATED)
BILIRUBIN UA: NEGATIVE
Glucose, UA: NEGATIVE
Ketones, UA: NEGATIVE
Leukocytes, UA: NEGATIVE
NITRITE UA: NEGATIVE
PH UA: 5.5
Spec Grav, UA: 1.025
Urobilinogen, UA: 0.2

## 2016-07-29 LAB — TSH: TSH: 2.73 u[IU]/mL (ref 0.35–4.50)

## 2016-07-29 NOTE — Progress Notes (Signed)
Pre visit review using our clinic review tool, if applicable. No additional management support is needed unless otherwise documented below in the visit note. 

## 2016-07-29 NOTE — Progress Notes (Signed)
Subjective:    Patient ID: Robert Anderson, male    DOB: Mar 25, 1924, 82 y.o.   MRN: [id number]  HPI   Patient presents for yearly preventative medicine examination. He is a pleasant 82 year old male who  has a past medical history of Atrial fibrillation ([place]); Bradycardia; DEGENERATIVE JOINT DISEASE, SPINE; ERECTILE DYSFUNCTION; Essential hypertension; First degree AV block; HEARING LOSS; Heart block; Hyperlipidemia; Hypertrophic obstructive cardiomyopathy (H[facility]); LBBB (left bundle branch block); OA (osteoarthritis); Pericardial effusion; Presence of permanent cardiac pacemaker (06/24/2015); Prostate cancer ([place]) (2010); S/P TAVR (transcatheter aortic valve replacement) (03/25/2015); Severe aortic stenosis; Squamous cell skin cancer; Stroke ([place]) (02/19/2014); and TIA (transient ischemic attack) (01/21/2014).   All immunizations and health maintenance protocols were reviewed with the patient and needed orders were placed.  Appropriate screening laboratory values were ordered for the patient including screening of hyperlipidemia, renal function and hepatic function. If indicated by BPH, a PSA was ordered.  Medication reconciliation,  past medical history, social history, problem list and allergies were reviewed in detail with the patient  Goals were established with regard to weight loss, exercise, and  diet in compliance with medications. He is going to the [facility] three times a day. He is trying to eat healthy.   End of life planning was discussed. He has an advanced directive and living will.   He has followed up with Dr. Meda Coffee and Dr. Lovena Le. Was recently started on Warfarin 3 mg    Review of Systems  Constitutional: Negative.   HENT: Positive for hearing loss (chronic ).   Eyes: Negative.   Respiratory: Negative.   Cardiovascular: Negative.   Gastrointestinal: Negative.   Endocrine: Negative.   Genitourinary: Negative.   Musculoskeletal: Negative.   Skin: Negative.   Allergic/Immunologic:  Negative.   Neurological: Negative.   Hematological: Negative.   Psychiatric/Behavioral: Negative.   All other systems reviewed and are negative.  Past Medical History:  Diagnosis Date  . Atrial fibrillation ([place])    a. dx after lead revision 06/26/15 >> anticoag not started due to recent pericardial effusion in setting of perforation and lead revision  . Bradycardia    a. HR 30s in clinic 06/2015 - BB discontinued.  . DEGENERATIVE JOINT DISEASE, SPINE   . ERECTILE DYSFUNCTION   . Essential hypertension   . First degree AV block   . HEARING LOSS   . Heart block    s/p Pacemaker  . Hyperlipidemia   . Hypertrophic obstructive cardiomyopathy ([place])   . LBBB (left bundle branch block)   . OA (osteoarthritis)    both hips  . Pericardial effusion    a. micorpeforation s/p lead revision 06/26/15  . Presence of permanent cardiac pacemaker 06/24/2015   St Jude Assurity  . Prostate cancer [facility]) 2010   "external radiation; 40 treatments"  . 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 Severe aortic stenosis    a. s/p TAVR 03/2015. Pre-op  LHC 02/2015: minor nonobstructive CAD.  Marland Kitchen Squamous cell skin cancer    left lower leg  . Stroke ([place]) 02/19/2014   Small infarct high posterior left frontal lobe on MRI   . TIA (transient ischemic attack) 01/21/2014   5 min spell aphasia without assoc sx      Social History   Social History  . Marital status: Married    Spouse name: N/A  . Number of children: N/A  .  Years of education: N/A   Occupational History  . Retired    Social History Main Topics  . Smoking status: Never Smoker  . Smokeless tobacco: Never Used  . Alcohol use No  . Drug use: No  . Sexual activity: Not Currently   Other Topics Concern  . Not on file   Social History Narrative   Patient lives in [place] with his wife. 2nd marriage    4 children    He  [facility]es to the [facility] three [facility] a week    Past Surgical History:  Procedure Laterality Date  . ANTERIOR CERVICAL DECOMP/DISCECTOMY FUSION  ~ 2000   Dr. Louanne Skye  . BACK SURGERY    . CARDIAC CATHETERIZATION N/A 02/20/2015   Procedure: Right/Left Heart Cath and Coronary Angiography;  Surgeon: Sherren Mocha, MD;  Location: [place] CV LAB;  Service: Cardiovascular;  Laterality: N/A;  . CARDIAC VALVE REPLACEMENT    . CATARACT EXTRACTION W/ INTRAOCULAR LENS  IMPLANT, BILATERAL  2004; 2010   right; left  . EP IMPLANTABLE DEVICE N/A 06/24/2015   Procedure: Pacemaker Implant;  Surgeon: Evans Lance, MD;  Location: [place] CV LAB;  Service: Cardiovascular; St Jude Assurity   . EP IMPLANTABLE DEVICE N/A 06/26/2015   Procedure: Lead Revision/Repair;  Surgeon: Will Meredith Leeds, MD;  Location: [place] CV LAB;  Service: Cardiovascular;  Laterality: N/A;  . INSERT / REPLACE / REMOVE PACEMAKER  06/24/2015  . JOINT REPLACEMENT    . SQUAMOUS CELL CARCINOMA EXCISION Left X 3   "lower leg"  . TEE WITHOUT CARDIOVERSION N/A 03/07/2015   Procedure: TRANSESOPHAGEAL ECHOCARDIOGRAM (TEE);  Surgeon: Dorothy Spark, MD;  Location: [place];  Service: Cardiovascular;  Laterality: N/A;  . TEE WITHOUT CARDIOVERSION N/A 03/25/2015   Procedure: TRANSESOPHAGEAL ECHOCARDIOGRAM (TEE);  Surgeon: Burnell Blanks, MD;  Location: [place];  Service: Open Heart Surgery;  Laterality: N/A;  . TONSILLECTOMY    . TOTAL SHOULDER ARTHROPLASTY Right 2007   "wore it out playing tennis"  . TRANSCATHETER AORTIC VALVE REPLACEMENT, TRANSFEMORAL N/A 03/25/2015   Procedure: TRANSCATHETER AORTIC VALVE REPLACEMENT, TRANSFEMORAL;  Surgeon: Burnell Blanks, MD;  Location: [place];  Service: Open Heart Surgery;  Laterality: N/A;    Family History  Problem Relation Age of Onset  . Heart disease Mother   . CVA Mother   . Stroke Mother   . CVA Father   . Cancer Father   . Brain cancer Sister   . Prostate cancer Brother     . Heart disease Brother   . Lung cancer Brother   . Heart attack Brother   . Hypertension Neg Hx     No Known Allergies  Current Outpatient Prescriptions on File Prior to Visit  Medication Sig Dispense Refill  . acetaminophen (TYLENOL) 325 MG tablet Take 1-2 tablets (325-650 mg total) by mouth every 4 (four) hours as needed for mild pain.    Marland Kitchen docusate sodium (COLACE) 100 MG capsule Take 300 mg by mouth at bedtime.     . metoprolol tartrate (LOPRESSOR) 25 MG tablet Take 0.5 tablets (12.5 mg total) by mouth 2 (two) times daily.    . Omega-3 Fatty Acids (FISH OIL) 1000 MG CAPS Take 1,000 mg by mouth 2 (two) times daily.    . tamsulosin (FLOMAX) 0.4 MG CAPS capsule Take 0.4 mg by mouth 3 (three) times a week. Monday, Wednesday, Friday    . warfarin (COUMADIN) 3 MG tablet Take 1 tablet (3 mg total) by mouth daily.  30 tablet 3   No current facility-administered medications on file prior to visit.     BP 102/60 (BP Location: Right Arm, Patient Position: Sitting, Cuff Size: Normal)   Pulse 60   Ht 5' 8.5" (1.74 m)   Wt 214 lb 9.6 oz (97.3 kg)   SpO2 94%   BMI 32.16 kg/m        Objective:   Physical Exam  Constitutional: He is oriented to person, place, and time. He appears well-developed and well-nourished. No distress.  HENT:  Head: Normocephalic and atraumatic.  Right Ear: Tympanic membrane, external ear and ear canal normal.  Left Ear: Tympanic membrane, external ear and ear canal normal.  Nose: Nose normal.  Mouth/Throat: Oropharynx is clear and moist. No oropharyngeal exudate.  Eyes: Conjunctivae and EOM are normal. Pupils are equal, round, and reactive to light. Right eye exhibits no discharge. Left eye exhibits no discharge. No scleral icterus.  Neck: Normal range of motion. Neck supple. No JVD present. No tracheal deviation present. No thyromegaly present.  Cardiovascular: Normal rate, regular rhythm, normal heart sounds and intact distal pulses.  Exam reveals no gallop  and no friction rub.   No murmur heard. Pulmonary/Chest: Effort normal and breath sounds normal. No stridor. No respiratory distress. He has no wheezes. He has no rales. He exhibits no tenderness.  Abdominal: Soft. Bowel sounds are normal. He exhibits no distension and no mass. There is no tenderness. There is no rebound and no guarding.  Genitourinary:  Genitourinary Comments: Deferred  Musculoskeletal: Normal range of motion. He exhibits no edema, tenderness or deformity.  Walks with a single prong cane. Is able to get from chair to exam table with minimal assistance.   Lymphadenopathy:    He has no cervical adenopathy.  Neurological: He is alert and oriented to person, place, and time. He has normal reflexes. He displays normal reflexes. No cranial nerve deficit. He exhibits normal muscle tone. Coordination normal.  Skin: Skin is warm and dry. No rash noted. He is not diaphoretic. No erythema. No pallor.  Psychiatric: He has a normal mood and affect. His behavior is normal. Judgment and thought content normal.  Nursing note and vitals reviewed.     Assessment & Plan:  1. Routine general medical examination at a health care facility - Continue to stay active, exercise and eat healthy.  - Basic metabolic panel - CBC with Differential/Platelet - Hepatic function panel - Lipid panel - TSH - PSA - POCT Urinalysis Dipstick (Automated)  2. Essential hypertension - Well controlled on current medication.  - No change  - Basic metabolic panel - CBC with Differential/Platelet - Hepatic function panel - Lipid panel - TSH - PSA - POCT Urinalysis Dipstick (Automated)  3. Hyperlipidemia, unspecified hyperlipidemia type  - Basic metabolic panel - CBC with Differential/Platelet - Hepatic function panel - Lipid panel - TSH - PSA - POCT Urinalysis Dipstick (Automated)  Dorothyann Peng, NP

## 2016-07-29 NOTE — Patient Instructions (Signed)
It was great seeing you today!   I will follow up with you regarding your labs.   Please continue to exercise and stay active.   Follow up with me in one year or sooner if needed  Health Maintenance, Male A healthy lifestyle and preventative care can promote health and wellness.  Maintain regular health, dental, and eye exams.  Eat a healthy diet. Foods like vegetables, fruits, whole grains, low-fat dairy products, and lean protein foods contain the nutrients you need and are low in calories. Decrease your intake of foods high in solid fats, added sugars, and salt. Get information about a proper diet from your health care provider, if necessary.  Regular physical exercise is one of the most important things you can do for your health. Most adults should get at least 150 minutes of moderate-intensity exercise (any activity that increases your heart rate and causes you to sweat) each week. In addition, most adults need muscle-strengthening exercises on 2 or more days a week.   Maintain a healthy weight. The body mass index (BMI) is a screening tool to identify possible weight problems. It provides an estimate of body fat based on height and weight. Your health care provider can find your BMI and can help you achieve or maintain a healthy weight. For males 20 years and older:  A BMI below 18.5 is considered underweight.  A BMI of 18.5 to 24.9 is normal.  A BMI of 25 to 29.9 is considered overweight.  A BMI of 30 and above is considered obese.  Maintain normal blood lipids and cholesterol by exercising and minimizing your intake of saturated fat. Eat a balanced diet with plenty of fruits and vegetables. Blood tests for lipids and cholesterol should begin at age 102 and be repeated every 5 years. If your lipid or cholesterol levels are high, you are over age 24, or you are at high risk for heart disease, you may need your cholesterol levels checked more frequently.Ongoing high lipid and  cholesterol levels should be treated with medicines if diet and exercise are not working.  If you smoke, find out from your health care provider how to quit. If you do not use tobacco, do not start.  Lung cancer screening is recommended for adults aged 32-80 years who are at high risk for developing lung cancer because of a history of smoking. A yearly low-dose CT scan of the lungs is recommended for people who have at least a 30-pack-year history of smoking and are current smokers or have quit within the past 15 years. A pack year of smoking is smoking an average of 1 pack of cigarettes a day for 1 year (for example, a 30-pack-year history of smoking could mean smoking 1 pack a day for 30 years or 2 packs a day for 15 years). Yearly screening should continue until the smoker has stopped smoking for at least 15 years. Yearly screening should be stopped for people who develop a health problem that would prevent them from having lung cancer treatment.  If you choose to drink alcohol, do not have more than 2 drinks per day. One drink is considered to be 12 oz (360 mL) of beer, 5 oz (150 mL) of wine, or 1.5 oz (45 mL) of liquor.  Avoid the use of street drugs. Do not share needles with anyone. Ask for help if you need support or instructions about stopping the use of drugs.  High blood pressure causes heart disease and increases the risk of  stroke. High blood pressure is more likely to develop in:  People who have blood pressure in the end of the normal range (100-139/85-89 mm Hg).  People who are overweight or obese.  People who are African American.  If you are 39-18 years of age, have your blood pressure checked every 3-5 years. If you are 56 years of age or older, have your blood pressure checked every year. You should have your blood pressure measured twice--once when you are at a hospital or clinic, and once when you are not at a hospital or clinic. Record the average of the two measurements. To  check your blood pressure when you are not at a hospital or clinic, you can use:  An automated blood pressure machine at a pharmacy.  A home blood pressure monitor.  If you are 5-62 years old, ask your health care provider if you should take aspirin to prevent heart disease.  Diabetes screening involves taking a blood sample to check your fasting blood sugar level. This should be done once every 3 years after age 64 if you are at a normal weight and without risk factors for diabetes. Testing should be considered at a younger age or be carried out more frequently if you are overweight and have at least 1 risk factor for diabetes.  Colorectal cancer can be detected and often prevented. Most routine colorectal cancer screening begins at the age of 70 and continues through age 13. However, your health care provider may recommend screening at an earlier age if you have risk factors for colon cancer. On a yearly basis, your health care provider may provide home test kits to check for hidden blood in the stool. A small camera at the end of a tube may be used to directly examine the colon (sigmoidoscopy or colonoscopy) to detect the earliest forms of colorectal cancer. Talk to your health care provider about this at age 75 when routine screening begins. A direct exam of the colon should be repeated every 5-10 years through age 50, unless early forms of precancerous polyps or small growths are found.  People who are at an increased risk for hepatitis B should be screened for this virus. You are considered at high risk for hepatitis B if:  You were born in a country where hepatitis B occurs often. Talk with your health care provider about which countries are considered high risk.  Your parents were born in a high-risk country and you have not received a shot to protect against hepatitis B (hepatitis B vaccine).  You have HIV or AIDS.  You use needles to inject street drugs.  You live with, or have sex  with, someone who has hepatitis B.  You are a man who has sex with other men (MSM).  You get hemodialysis treatment.  You take certain medicines for conditions like cancer, organ transplantation, and autoimmune conditions.  Hepatitis C blood testing is recommended for all people born from [birth year] through 1963 and any individual with known risk factors for hepatitis C.  Healthy men should no longer receive prostate-specific antigen (PSA) blood tests as part of routine cancer screening. Talk to your health care provider about prostate cancer screening.  Testicular cancer screening is not recommended for adolescents or adult males who have no symptoms. Screening includes self-exam, a health care provider exam, and other screening tests. Consult with your health care provider about any symptoms you have or any concerns you have about testicular cancer.  Practice safe sex. Use  condoms and avoid high-risk sexual practices to reduce the spread of sexually transmitted infections (STIs).  You should be screened for STIs, including gonorrhea and chlamydia if:  You are sexually active and are younger than 24 years.  You are older than 24 years, and your health care provider tells you that you are at risk for this type of infection.  Your sexual activity has changed since you were last screened, and you are at an increased risk for chlamydia or gonorrhea. Ask your health care provider if you are at risk.  If you are at risk of being infected with HIV, it is recommended that you take a prescription medicine daily to prevent HIV infection. This is called pre-exposure prophylaxis (PrEP). You are considered at risk if:  You are a man who has sex with other men (MSM).  You are a heterosexual man who is sexually active with multiple partners.  You take drugs by injection.  You are sexually active with a partner who has HIV.  Talk with your health care provider about whether you are at high risk of being  infected with HIV. If you choose to begin PrEP, you should first be tested for HIV. You should then be tested every 3 months for as long as you are taking PrEP.  Use sunscreen. Apply sunscreen liberally and repeatedly throughout the day. You should seek shade when your shadow is shorter than you. Protect yourself by wearing long sleeves, pants, a wide-brimmed hat, and sunglasses year round whenever you are outdoors.  Tell your health care provider of new moles or changes in moles, especially if there is a change in shape or color. Also, tell your health care provider if a mole is larger than the size of a pencil eraser.  A one-time screening for abdominal aortic aneurysm (AAA) and surgical repair of large AAAs by ultrasound is recommended for men aged 73-75 years who are current or former smokers.  Stay current with your vaccines (immunizations).   This information is not intended to replace advice given to you by your health care provider. Make sure you discuss any questions you have with your health care provider.   Document Released: 01/29/2008 Document Revised: 08/23/2014 Document Reviewed: 12/28/2010 Elsevier Interactive Patient Education [employer].

## 2016-07-30 LAB — CBC WITH DIFFERENTIAL/PLATELET
BASOS ABS: 0.1 10*3/uL (ref 0.0–0.1)
Basophils Relative: 1 % (ref 0.0–3.0)
EOS ABS: 0.1 10*3/uL (ref 0.0–0.7)
Eosinophils Relative: 1.9 % (ref 0.0–5.0)
HCT: 45.5 % (ref 39.0–52.0)
Hemoglobin: 15.3 g/dL (ref 13.0–17.0)
LYMPHS ABS: 1.5 10*3/uL (ref 0.7–4.0)
Lymphocytes Relative: 26.1 % (ref 12.0–46.0)
MCHC: 33.6 g/dL (ref 30.0–36.0)
MCV: 89.1 fl (ref 78.0–100.0)
MONO ABS: 0.5 10*3/uL (ref 0.1–1.0)
Monocytes Relative: 7.7 % (ref 3.0–12.0)
NEUTROS ABS: 3.8 10*3/uL (ref 1.4–7.7)
NEUTROS PCT: 63.3 % (ref 43.0–77.0)
PLATELETS: 178 10*3/uL (ref 150.0–400.0)
RBC: 5.1 Mil/uL (ref 4.22–5.81)
RDW: 13.4 % (ref 11.5–15.5)
WBC: 5.9 10*3/uL (ref 4.0–10.5)

## 2016-08-03 DIAGNOSIS — L821 Other seborrheic keratosis: Secondary | ICD-10-CM | POA: Diagnosis not present

## 2016-08-03 DIAGNOSIS — L570 Actinic keratosis: Secondary | ICD-10-CM | POA: Diagnosis not present

## 2016-08-03 DIAGNOSIS — Z85828 Personal history of other malignant neoplasm of skin: Secondary | ICD-10-CM | POA: Diagnosis not present

## 2016-08-04 ENCOUNTER — Telehealth: Payer: Self-pay | Admitting: Adult Health

## 2016-08-04 NOTE — Telephone Encounter (Signed)
° ° ° ° °  Pt call to ask if a copy of his lab results could be mailed to his billing address

## 2016-08-05 NOTE — Telephone Encounter (Signed)
A copy of the patient's labs have been printed and placed up front in out-going mail. Thanks!

## 2016-08-06 ENCOUNTER — Ambulatory Visit (INDEPENDENT_AMBULATORY_CARE_PROVIDER_SITE_OTHER): Payer: Medicare Other | Admitting: *Deleted

## 2016-08-06 DIAGNOSIS — Z952 Presence of prosthetic heart valve: Secondary | ICD-10-CM

## 2016-08-06 DIAGNOSIS — I4891 Unspecified atrial fibrillation: Secondary | ICD-10-CM | POA: Diagnosis not present

## 2016-08-06 DIAGNOSIS — Z5181 Encounter for therapeutic drug level monitoring: Secondary | ICD-10-CM

## 2016-08-06 LAB — POCT INR: INR: 2

## 2016-08-20 ENCOUNTER — Ambulatory Visit (INDEPENDENT_AMBULATORY_CARE_PROVIDER_SITE_OTHER): Payer: Medicare Other | Admitting: *Deleted

## 2016-08-20 DIAGNOSIS — Z5181 Encounter for therapeutic drug level monitoring: Secondary | ICD-10-CM | POA: Diagnosis not present

## 2016-08-20 DIAGNOSIS — Z952 Presence of prosthetic heart valve: Secondary | ICD-10-CM

## 2016-08-20 DIAGNOSIS — I4891 Unspecified atrial fibrillation: Secondary | ICD-10-CM | POA: Diagnosis not present

## 2016-08-20 LAB — POCT INR: INR: 2.7

## 2016-08-23 ENCOUNTER — Encounter: Payer: Self-pay | Admitting: Family Medicine

## 2016-08-23 ENCOUNTER — Ambulatory Visit (INDEPENDENT_AMBULATORY_CARE_PROVIDER_SITE_OTHER): Payer: Medicare Other | Admitting: Family Medicine

## 2016-08-23 VITALS — BP 104/70 | HR 81 | Temp 97.6°F | Ht 68.5 in | Wt 213.0 lb

## 2016-08-23 DIAGNOSIS — J40 Bronchitis, not specified as acute or chronic: Secondary | ICD-10-CM | POA: Diagnosis not present

## 2016-08-23 MED ORDER — CEPHALEXIN 500 MG PO CAPS: 500.0000 mg | ORAL_CAPSULE | Freq: Three times a day (TID) | ORAL | 0 refills | Status: AC

## 2016-08-23 NOTE — Progress Notes (Signed)
Pre visit review using our clinic review tool, if applicable. No additional management support is needed unless otherwise documented below in the visit note. 

## 2016-08-23 NOTE — Progress Notes (Signed)
   Subjective:    Patient ID: Robert Anderson, male    DOB: 26-Jan-1924, 83 y.o.   MRN: [id number]  HPI Here with his wife for 10 days of chest congestion and a dry cough. No fever. Drinking fluids.    Review of Systems  Constitutional: Negative.   HENT: Negative.   Eyes: Negative.   Respiratory: Positive for cough and chest tightness. Negative for shortness of breath and wheezing.   Cardiovascular: Negative.        Objective:   Physical Exam  Constitutional: He appears well-developed and well-nourished.  HENT:  Right Ear: External ear normal.  Left Ear: External ear normal.  Nose: Nose normal.  Mouth/Throat: Oropharynx is clear and moist.  Eyes: Conjunctivae are normal.  Neck: No thyromegaly present.  Pulmonary/Chest: Effort normal. No respiratory distress. He has no wheezes. He has no rales.  Scattered rhonchi   Lymphadenopathy:    He has no cervical adenopathy.          Assessment & Plan:  Bronchitis, treat with Keflex. Add Robitussin prn.  Alysia Penna, MD

## 2016-09-09 ENCOUNTER — Ambulatory Visit (INDEPENDENT_AMBULATORY_CARE_PROVIDER_SITE_OTHER): Payer: Medicare Other | Admitting: *Deleted

## 2016-09-09 DIAGNOSIS — Z5181 Encounter for therapeutic drug level monitoring: Secondary | ICD-10-CM

## 2016-09-09 DIAGNOSIS — I4891 Unspecified atrial fibrillation: Secondary | ICD-10-CM

## 2016-09-09 DIAGNOSIS — Z952 Presence of prosthetic heart valve: Secondary | ICD-10-CM

## 2016-09-09 LAB — POCT INR: INR: 2.6

## 2016-09-21 ENCOUNTER — Ambulatory Visit (INDEPENDENT_AMBULATORY_CARE_PROVIDER_SITE_OTHER): Payer: Medicare Other | Admitting: *Deleted

## 2016-09-21 DIAGNOSIS — I442 Atrioventricular block, complete: Secondary | ICD-10-CM | POA: Diagnosis not present

## 2016-09-21 NOTE — Progress Notes (Signed)
Remote pacemaker transmission.   

## 2016-09-23 ENCOUNTER — Ambulatory Visit (INDEPENDENT_AMBULATORY_CARE_PROVIDER_SITE_OTHER): Payer: Medicare Other | Admitting: Adult Health

## 2016-09-23 ENCOUNTER — Encounter: Payer: Self-pay | Admitting: Adult Health

## 2016-09-23 VITALS — BP 102/50 | Temp 97.4°F | Wt 211.0 lb

## 2016-09-23 DIAGNOSIS — J0111 Acute recurrent frontal sinusitis: Secondary | ICD-10-CM

## 2016-09-23 MED ORDER — DOXYCYCLINE HYCLATE 100 MG PO CAPS: 100.0000 mg | ORAL_CAPSULE | Freq: Two times a day (BID) | ORAL | 0 refills | Status: DC

## 2016-09-23 NOTE — Progress Notes (Signed)
Subjective:    Patient ID: Robert Anderson, male    DOB: 13-Apr-1924, 83 y.o.   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 he presents to the office today for one week of chest congestion, semi productive cough, subjective fever, sinus pain and pressure and dizziness.    Review of Systems See HPI  Past Medical History:  Diagnosis Date  . Atrial fibrillation ([place])    a. dx after lead revision 06/26/15 >> anticoag not started due to recent pericardial effusion in setting of perforation and lead revision  . Bradycardia    a. HR 30s in clinic 06/2015 - BB discontinued.  . DEGENERATIVE JOINT DISEASE, SPINE   . ERECTILE DYSFUNCTION   . Essential hypertension   . First degree AV block   . HEARING LOSS   . Heart block    s/p Pacemaker  . Hyperlipidemia   . Hypertrophic obstructive cardiomyopathy ([place])   . LBBB (left bundle branch block)   . OA (osteoarthritis)    both hips  . Pericardial effusion    a. micorpeforation s/p lead revision 06/26/15  . Presence of permanent cardiac pacemaker 06/24/2015   St Jude Assurity  . Prostate cancer [facility]) 2010   "external radiation; 40 treatments"  . 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 Severe aortic stenosis    a. s/p TAVR 03/2015. Pre-op   [facility]HC 02/2015: minor nonobstructive CAD.  Marland Kitchen Squamous cell skin cancer    left lower leg  . Stroke ([place]) 02/19/2014   Small infarct high posterior left frontal lobe on MRI   . TIA (transient ischemic attack) 01/21/2014   5 min spell aphasia without assoc sx      Social History   Social History  . Marital status: Married    Spouse name: N/A  . Number of children: N/A  . Years of education: N/A   Occupational History  . Retired    Social History Main Topics  . Smoking status: Never Smoker  . Smokeless tobacco: Never Used  . Alcohol use No  . Drug use: No  . Sexual activity: Not Currently   Other Topics Concern  . Not on file   Social History Narrative   Patient lives in [place] with his wife. 2nd marriage    4 children    He goes to the [facility] three days a week    Past Surgical History:  Procedure Laterality Date  . ANTERIOR CERVICAL DECOMP/DISCECTOMY FUSION  ~ 2000   Dr. Louanne Skye  . BACK SURGERY    . CARDIAC CATHETERIZATION N/A 02/20/2015   Procedure: Right/Left Heart Cath and Coronary Angiography;  Surgeon: Sherren Mocha, MD;  Location: [place] [facility];  Service: Cardiovascular;  Laterality: N/A;  . CARDIAC VALVE REPLACEMENT    . CATARACT EXTRACTION W/ INTRAOCULAR LENS  IMPLANT, BILATERAL  2004; 2010   right; left  . EP IMPLANTABLE DEVICE N/A 06/24/2015   Procedure: Pacemaker Implant;  Surgeon: Carleene Overlie  Peyton Najjar, MD;  Location: [place] [facility];  Service: Cardiovascular; St Jude Assurity   . EP IMPLANTABLE DEVICE N/A 06/26/2015   Procedure: Lead Revision/Repair;  Surgeon: Will Meredith Leeds, MD;  Location: [place] [facility];  Service: Cardiovascular;  Laterality: N/A;  . INSERT / REPLACE / REMOVE PACEMAKER  06/24/2015  . JOINT REPLACEMENT    . SQUAMOUS CELL CARCINOMA EXCISION Left X 3   "lower leg"  . TEE WITHOUT CARDIOVERSION N/A 03/07/2015   Procedure: TRANSESOPHAGEAL ECHOCARDIOGRAM (TEE);  Surgeon: Dorothy Spark, MD;  Location: [place];  Service:  Cardiovascular;  Laterality: N/A;  . TEE WITHOUT CARDIOVERSION N/A 03/25/2015   Procedure: TRANSESOPHAGEAL ECHOCARDIOGRAM (TEE);  Surgeon: Burnell Blanks, MD;  Location: [place];  Service: Open Heart Surgery;  Laterality: N/A;  . TONSILLECTOMY    . TOTAL SHOULDER ARTHROPLASTY Right 2007   "wore it out playing tennis"  . TRANSCATHETER AORTIC VALVE REPLACEMENT, TRANSFEMORAL N/A 03/25/2015   Procedure: TRANSCATHETER AORTIC VALVE REPLACEMENT, TRANSFEMORAL;  Surgeon: Burnell Blanks, MD;  Location: [place];  Service: Open Heart Surgery;  Laterality: N/A;    Family History  Problem Relation Age of Onset  . Heart disease Mother   . CVA Mother   . Stroke Mother   . CVA Father   . Cancer Father   . Brain cancer Sister   . Prostate cancer Brother   . Heart disease Brother   . Lung cancer Brother   . Heart attack Brother   . Hypertension Neg Hx     No Known Allergies  Current Outpatient Prescriptions on File Prior to Visit  Medication Sig Dispense Refill  . acetaminophen (TYLENOL) 325 MG tablet Take 1-2 tablets (325-650 mg total) by mouth every 4 (four) hours as needed for mild pain.    Marland Kitchen docusate sodium (COLACE) 100 MG capsule Take 300 mg by mouth at bedtime.     . metoprolol tartrate (LOPRESSOR) 25 MG tablet Take 0.5 tablets (12.5 mg total) by mouth 2 (two) times daily.    . Omega-3 Fatty Acids (FISH OIL) 1000 MG CAPS Take 1,000 mg by mouth 2 (two) times daily.    . tamsulosin (FLOMAX) 0.4 MG CAPS capsule Take 0.4 mg by mouth 3 (three) times a week. Monday, Wednesday, Friday    . warfarin (COUMADIN) 3 MG tablet Take 1 tablet (3 mg total) by mouth daily. 30 tablet 3   No current facility-administered medications on file prior to visit.     BP (!) 102/50 (BP Location: Left Arm, Patient Position: Sitting, Cuff Size: Normal)   Temp 97.4 F (36.3 C)   Wt 211 lb (95.7 kg)   BMI 31.62 kg/m       Objective:   Physical Exam  Constitutional: He is oriented to person, place, and  time. He appears well-developed and well-nourished. No distress.  HENT:  Head: Normocephalic and atraumatic.  Right Ear: External ear normal.  Left Ear: External ear normal.  Nose: Right sinus exhibits frontal sinus tenderness. Right sinus exhibits no maxillary sinus tenderness. Left sinus exhibits frontal sinus tenderness.  Mouth/Throat: Uvula is midline and oropharynx is clear and moist. No oropharyngeal exudate.  Cardiovascular: Normal rate, regular rhythm, normal heart sounds and intact distal pulses.  Exam reveals no friction rub.   No murmur heard. Pulmonary/Chest: Effort normal and breath sounds normal. No respiratory distress. He has no wheezes. He has no rales. He exhibits no tenderness.  Neurological: He is alert and oriented to person, place, and time.  Skin: Skin is warm and dry. No rash noted. He is not diaphoretic. No erythema. No pallor.  Psychiatric: He has a normal mood and affect. His behavior is normal. Judgment and thought content normal.  Nursing note and vitals reviewed.      Assessment & Plan:  1. Acute recurrent frontal sinusitis - doxycycline (VIBRAMYCIN) 100 MG capsule; Take 1 capsule (100 mg total) by mouth 2 (two) times daily.  Dispense: 14 capsule; Refill: 0 - Follow up if no improvement in the next 2-3 days  - Stay hydrated and rest  Dorothyann Peng, NP

## 2016-09-24 ENCOUNTER — Encounter: Payer: Self-pay | Admitting: Cardiology

## 2016-09-24 LAB — CUP PACEART REMOTE DEVICE CHECK
Battery Voltage: 2.98 V
Brady Statistic AP VP Percent: 77 %
Brady Statistic AP VS Percent: 1 %
Brady Statistic AS VP Percent: 22 %
Brady Statistic RA Percent Paced: 77 %
Brady Statistic RV Percent Paced: 99 %
Implantable Lead Implant Date: 20161108
Implantable Lead Location: 753860
Implantable Pulse Generator Implant Date: 20161108
Lead Channel Impedance Value: 460 Ohm
Lead Channel Pacing Threshold Amplitude: 0.75 V
Lead Channel Pacing Threshold Pulse Width: 0.5 ms
Lead Channel Sensing Intrinsic Amplitude: 12 mV
Lead Channel Setting Pacing Amplitude: 0.75 V
Lead Channel Setting Pacing Amplitude: 2 V
Lead Channel Setting Pacing Pulse Width: 0.5 ms
Lead Channel Setting Sensing Sensitivity: 5 mV
MDC IDC LEAD IMPLANT DT: 20161108
MDC IDC LEAD LOCATION: 753859
MDC IDC MSMT BATTERY REMAINING LONGEVITY: 116 mo
MDC IDC MSMT BATTERY REMAINING PERCENTAGE: 95.5 %
MDC IDC MSMT LEADCHNL RA IMPEDANCE VALUE: 410 Ohm
MDC IDC MSMT LEADCHNL RA SENSING INTR AMPL: 5 mV
MDC IDC MSMT LEADCHNL RV PACING THRESHOLD AMPLITUDE: 0.5 V
MDC IDC MSMT LEADCHNL RV PACING THRESHOLD PULSEWIDTH: 0.5 ms
MDC IDC SESS DTM: 20180206070022
MDC IDC STAT BRADY AS VS PERCENT: 1 %
Pulse Gen Serial Number: 7825693

## 2016-09-27 ENCOUNTER — Ambulatory Visit: Payer: Medicare Other | Admitting: Cardiology

## 2016-10-05 ENCOUNTER — Ambulatory Visit (INDEPENDENT_AMBULATORY_CARE_PROVIDER_SITE_OTHER): Payer: Medicare Other | Admitting: Neurology

## 2016-10-05 ENCOUNTER — Encounter: Payer: Self-pay | Admitting: Neurology

## 2016-10-05 VITALS — BP 114/68 | HR 95 | Ht 68.5 in | Wt 210.6 lb

## 2016-10-05 DIAGNOSIS — Z8673 Personal history of transient ischemic attack (TIA), and cerebral infarction without residual deficits: Secondary | ICD-10-CM

## 2016-10-05 NOTE — Progress Notes (Signed)
NEUROLOGY FOLLOW UP OFFICE NOTE  TEAGEN HOGLAND EQ:[id number]  HISTORY OF PRESENT ILLNESS: I had the pleasure of seeing Edsel Buruca in follow-up in the neurology clinic on 10/05/2016.  The patient was last seen 1 year ago after an episode of transient aphasia last 01/19/14. His MRI brain had shown a small cortical focus of FLAIR signal in the high posterior left frontal lobe suggestive of late subacute infarct. Stroke workup unremarkable except for moderately dilated left atrium. He had a pacemaker placed in November 2016. Since his last visit, he was started on Coumadin and is doing well with no further stroke-like symptoms. He denies any further word-finding difficulties, no focal numbness/tingling/weakness, dysarthria/dysphagia, no falls. He has been unable to go to the [facility] because his wife has been sick, and has been exercising on the stationary bike at home daily. He denies getting lost driving, no missed bill payments or medications.   HPI: This is a very pleasant 83 yo RH man with a history of hypertension, in his usual state of health until 01/19/2014 they were getting ready to eat and went into the car. His wife was in the backseat asking him if he had the house keys and he could not answer her, stating that "the words wouldn't come out." He tried to reach his arm out to stop her but could not say a single word, then 5 minutes later he was able to talk. He denied any associated headache, focal numbness/tingling/weakness, or dizziness. His wife reported that his demeanor appeared like he was thinking, she thought he didn't hear her but appeared to be trying to say something but couldn't say it. She asked him to stick his tongue out which he was able to do, then he started talking.   Diagnostic Data: I personally reviewed MRI brain without contrast done 10 days after event which showed a small cortical focus of FLAIR signal in the high posterior left frontal lobe suggestive of late subacute infarct.  MRA brain unremarkable. Carotid dopplers showed heterogenous plaque bilaterally with no significant stenosis. Echocardiogram showed severe concentric LVH, hyperdynamic LVEF, moderate to severe aortic stenosis. Left atrium was moderately dilated. He was evaluated by cardiology, diuretics discontinued and started on Metoprolol.HbA1c 6.0, lipid panel showed LDL 85. Triglycerides were elevated at 321, however he was not fasting prior to the test.  PAST MEDICAL HISTORY: Past Medical History:  Diagnosis Date  . Atrial fibrillation ([place])    a. dx after lead revision 06/26/15 >> anticoag not started due to recent pericardial effusion in setting of perforation and lead revision  . Bradycardia    a. HR 30s in clinic 06/2015 - BB discontinued.  . DEGENERATIVE JOINT DISEASE, SPINE   . ERECTILE DYSFUNCTION   . Essential hypertension   . First degree AV block   . HEARING LOSS   . Heart block    s/p Pacemaker  . Hyperlipidemia   . Hypertrophic obstructive cardiomyopathy ([place])   . LBBB (left bundle branch block)   . OA (osteoarthritis)    both hips  . Pericardial effusion    a. micorpeforation s/p lead revision 06/26/15  . Presence of permanent cardiac pacemaker 06/24/2015   St Jude Assurity  . Prostate cancer [facility]) 2010   "external radiation; 40 treatments"  . 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 Severe aortic stenosis    a. s/p TAVR 03/2015. Pre-op  LHC 02/2015: minor nonobstructive CAD.  Marland Kitchen Squamous cell skin cancer    left lower leg  . Stroke ([place]) 02/19/2014   Small infarct high posterior left frontal lobe on MRI   . TIA (transient ischemic attack) 01/21/2014   5 min spell aphasia without assoc sx      MEDICATIONS: Current Outpatient Prescriptions on File Prior to Visit  Medication Sig Dispense Refill  . acetaminophen (TYLENOL) 325 MG tablet Take 1-2 tablets (325-650 mg  total) by mouth every 4 (four) hours as needed for mild pain.    Marland Kitchen docusate sodium (COLACE) 100 MG capsule Take 300 mg by mouth at bedtime.     Marland Kitchen doxycycline (VIBRAMYCIN) 100 MG capsule Take 1 capsule (100 mg total) by mouth 2 (two) times daily. 14 capsule 0  . metoprolol tartrate (LOPRESSOR) 25 MG tablet Take 0.5 tablets (12.5 mg total) by mouth 2 (two) times daily.    . Omega-3 Fatty Acids (FISH OIL) 1000 MG CAPS Take 1,000 mg by mouth 2 (two) times daily.    . tamsulosin (FLOMAX) 0.4 MG CAPS capsule Take 0.4 mg by mouth 3 (three) times a week. Monday, Wednesday, Friday    . warfarin (COUMADIN) 3 MG tablet Take 1 tablet (3 mg total) by mouth daily. 30 tablet 3   No current facility-administered medications on file prior to visit.     ALLERGIES: No Known Allergies  FAMILY HISTORY: Family History  Problem Relation Age of Onset  . Heart disease Mother   . CVA Mother   . Stroke Mother   . CVA Father   . Cancer Father   . Brain cancer Sister   . Prostate cancer Brother   . Heart disease Brother   . Lung cancer Brother   . Heart attack Brother   . Hypertension Neg Hx     SOCIAL HISTORY: Social History   Social History  . Marital status: Married    Spouse name: N/A  . Number of children: N/A  . Years of education: N/A   Occupational History  . Retired    Social History Main Topics  . Smoking status: Never Smoker  . Smokeless tobacco: Never Used  . Alcohol use No  . Drug use: No  . Sexual activity: Not Currently   Other Topics Concern  . Not on file   Social History Narrative   Patient lives in [place] with his wife. 2nd marriage    4 children    He goes to the [facility] three days a week    REVIEW OF SYSTEMS: Constitutional: No fevers, chills, or sweats, no generalized fatigue, change in appetite Eyes: No visual changes, double vision, eye pain Ear, nose and throat: No hearing loss, ear pain, nasal congestion, sore throat Cardiovascular: No chest pain,  palpitations Respiratory:  No shortness of breath at rest or with exertion, wheezes GastrointestinaI: No nausea, vomiting, diarrhea, abdominal pain, fecal incontinence Genitourinary:  No dysuria, urinary retention or frequency Musculoskeletal:  No neck pain, back pain Integumentary: No rash, pruritus, skin lesions Neurological: as above Psychiatric: No depression, insomnia, anxiety Endocrine: No palpitations, fatigue, diaphoresis, mood swings, change in appetite, change in weight, increased thirst Hematologic/Lymphatic:  No anemia, purpura, petechiae. Allergic/Immunologic: no itchy/runny eyes, nasal congestion, recent allergic reactions, rashes  PHYSICAL EXAM: Vitals:   10/05/16 1521  BP: 114/68  Pulse: 95   General: No acute distress Head:  Normocephalic/atraumatic Neck: supple, no paraspinal tenderness, full range of motion Heart:  Regular rate and rhythm Lungs:  Clear to  auscultation bilaterally Back: No paraspinal tenderness Skin/Extremities: No rash, no edema Neurological Exam: alert and oriented to person, place, and time. No aphasia or dysarthria. Fund of knowledge is appropriate.  Recent and remote memory intact. 3/3 delayed recall.  Attention and concentration are normal, 3/5 spelling WORLD backwards.    Able to name objects and repeat phrases. Cranial nerves: Pupils equal, round, reactive to light.  Extraocular movements intact with no nystagmus. Visual fields full. Facial sensation intact. No facial asymmetry. Tongue, uvula, palate midline.  Motor: Bulk and tone normal, muscle strength 5/5 throughout with no pronator drift.  Sensation to light touch intact.  No extinction to double simultaneous stimulation.  Deep tendon reflexes 2+ throughout, toes downgoing.  Finger to nose testing intact.  Gait slow and cautious, appears to favor right leg, unable to tandem walk.  Romberg negative.  IMPRESSION: This is a pleasant 83 yo RH man who presented with a transient 5-minute episode of  inability to speak last June 2015. MRI brain showed a small cortical infarct in the left posterior frontal region. MRA and carotid dopplers did not show significant obstruction, echo shows moderate aortic stenosis and dilated left atrium. He underwent pacemaker placement in November 2016 for complete heart block and is now on anticoagulation with Coumadin. He continues to do well from a neurological standpoint with no further stroke-like symptoms. Continue control of vascular risk factors, BP today 114/68, continue to keep LDL <70. Continue exercise. He knows to go to ER immediately if symptoms recur. He will follow-up on a prn basis and knows to call for any changes.  Thank you for allowing me to participate in his care.  Please do not hesitate to call for any questions or concerns.  The duration of this appointment visit was 25 minutes of face-to-face time with the patient.  Greater than 50% of this time was spent in counseling, explanation of diagnosis, planning of further management, and coordination of care.   Ellouise Newer, M.D.   CC: Dr. Sherren Mocha, Dr. Meda Coffee, Dorothyann Peng

## 2016-10-05 NOTE — Patient Instructions (Signed)
You look great! Continue all your medications. Follow-up on as needed basis, call our office for any changes. If symptoms change, go to ER immediately.

## 2016-10-07 ENCOUNTER — Ambulatory Visit (INDEPENDENT_AMBULATORY_CARE_PROVIDER_SITE_OTHER): Payer: Medicare Other

## 2016-10-07 DIAGNOSIS — I4891 Unspecified atrial fibrillation: Secondary | ICD-10-CM

## 2016-10-07 DIAGNOSIS — Z952 Presence of prosthetic heart valve: Secondary | ICD-10-CM | POA: Diagnosis not present

## 2016-10-07 DIAGNOSIS — Z5181 Encounter for therapeutic drug level monitoring: Secondary | ICD-10-CM

## 2016-10-07 LAB — POCT INR: INR: 3.8

## 2016-10-16 ENCOUNTER — Other Ambulatory Visit: Payer: Self-pay | Admitting: Internal Medicine

## 2016-10-18 ENCOUNTER — Other Ambulatory Visit: Payer: Self-pay | Admitting: *Deleted

## 2016-10-21 ENCOUNTER — Ambulatory Visit (INDEPENDENT_AMBULATORY_CARE_PROVIDER_SITE_OTHER): Payer: Medicare Other | Admitting: *Deleted

## 2016-10-21 DIAGNOSIS — I4891 Unspecified atrial fibrillation: Secondary | ICD-10-CM | POA: Diagnosis not present

## 2016-10-21 DIAGNOSIS — Z5181 Encounter for therapeutic drug level monitoring: Secondary | ICD-10-CM

## 2016-10-21 DIAGNOSIS — Z952 Presence of prosthetic heart valve: Secondary | ICD-10-CM | POA: Diagnosis not present

## 2016-10-21 LAB — POCT INR: INR: 2.5

## 2016-11-03 ENCOUNTER — Ambulatory Visit: Payer: Medicare Other | Admitting: Cardiology

## 2016-11-15 ENCOUNTER — Other Ambulatory Visit: Payer: Self-pay

## 2016-11-15 ENCOUNTER — Ambulatory Visit (INDEPENDENT_AMBULATORY_CARE_PROVIDER_SITE_OTHER): Payer: Medicare Other | Admitting: Cardiology

## 2016-11-15 ENCOUNTER — Encounter (INDEPENDENT_AMBULATORY_CARE_PROVIDER_SITE_OTHER): Payer: Self-pay

## 2016-11-15 ENCOUNTER — Ambulatory Visit (INDEPENDENT_AMBULATORY_CARE_PROVIDER_SITE_OTHER): Payer: Medicare Other | Admitting: *Deleted

## 2016-11-15 ENCOUNTER — Encounter: Payer: Self-pay | Admitting: Cardiology

## 2016-11-15 VITALS — BP 120/60 | HR 60 | Ht 68.5 in | Wt 216.0 lb

## 2016-11-15 DIAGNOSIS — I421 Obstructive hypertrophic cardiomyopathy: Secondary | ICD-10-CM

## 2016-11-15 DIAGNOSIS — Z5181 Encounter for therapeutic drug level monitoring: Secondary | ICD-10-CM | POA: Diagnosis not present

## 2016-11-15 DIAGNOSIS — I4891 Unspecified atrial fibrillation: Secondary | ICD-10-CM

## 2016-11-15 DIAGNOSIS — Z950 Presence of cardiac pacemaker: Secondary | ICD-10-CM | POA: Diagnosis not present

## 2016-11-15 DIAGNOSIS — Z952 Presence of prosthetic heart valve: Secondary | ICD-10-CM

## 2016-11-15 LAB — POCT INR: INR: 2.5

## 2016-11-15 MED ORDER — WARFARIN SODIUM 3 MG PO TABS: ORAL_TABLET | ORAL | 1 refills | Status: DC

## 2016-11-15 NOTE — Patient Instructions (Signed)
Medication Instructions:   Your physician recommends that you continue on your current medications as directed. Please refer to the Current Medication list given to you today.    Follow-Up:  Your physician wants you to follow-up in: 4 MONTHS WITH DR NELSON You will receive a reminder letter in the mail two months in advance. If you don't receive a letter, please call our office to schedule the follow-up appointment.        If you need a refill on your cardiac medications before your next appointment, please call your pharmacy.   

## 2016-11-15 NOTE — Progress Notes (Signed)
Patient ID: Robert Anderson, male   DOB: [birth year], 83 y.o.   MRN: [id number]    CARDIOLOGY OFFICE NOTE  Date:  11/15/2016   Robert Anderson Date of Birth: Feb 09, 1924 Medical Record #[id number]  PCP:  Dorothyann Peng, NP  Cardiologist:  Meda Coffee    Chief complain: 3 month follow up  History of Present Illness: Robert Anderson is a 83 y.o. male who presents today for a follow up visit.  He has a history of severe AS s/p TAVR 03/25/15, HOCM, LBBB, 1st degree AVB, essential HTN, h/o TIA & CVA.  LHC 02/2015: minor nonobstructive CAD. 2D echo 04/25/15: vigorous LV systolic function with near-cavity obliteration, moderate LVH, EF 65-70%, grade 1 DD, mod LAE. His post op course was complicated by symptomatic 2:1 block, s/p PM placement.  He presented to the ER on 11/21 with chest pain and was diagnosed with and acute pericarditis, started on Colchicine, ibuprofen, symptoms of chest pain are now resolved. He is off all medicines. He saw Dr. Lovena Le in December for follow-up for pacemaker placement and repositioning of the lead that Dislodged, the pacemakers working properly.  06/16/2016- in August after PM interrogation there was PAF with one of the episodes lasting or 4 hours. Since he has a prior history of TIA the brought him in to discuss anticoagulation. Since his CHADS-VASc is 6 we decided to start Eliquis. This was not approved by his insurance as he has a bioprosthetic valve. Considering his age, we decided against Coumadin and frequent lab work. His A-fib never lasted > 48 hours.   Today he states that he has been feeling great, he continues to visit [employer] and exercises daily without any significant symptoms. No LE edema, no orthopnea, no palpitations, no chest pain or syncope.  11/15/2016 - this is 4 months follow-up, the patient has been started on Coumadin and reports no bleeding. He has been doing great he goes to gym 3 times a week and performs 30 minutes of cardio and 30 minutes of strengthening exercises. On days  when he doesn't go to the gym he works out on a stationary bike at home. He reports 1 episode of orthostatic hypotension after prolonged sitting, however denies any presyncope or syncope. He denies any palpitations.  Past Medical History:  Diagnosis Date  . Atrial fibrillation ([place])    a. dx after lead revision 06/26/15 >> anticoag not started due to recent pericardial effusion in setting of perforation and lead revision  . Bradycardia    a. HR 30s in clinic 06/2015 - BB discontinued.  . DEGENERATIVE JOINT DISEASE, SPINE   . ERECTILE DYSFUNCTION   . Essential hypertension   . First degree AV block   . HEARING LOSS   . Heart block    s/p Pacemaker  . Hyperlipidemia   . Hypertrophic obstructive cardiomyopathy ([place])   . LBBB (left bundle branch block)   . OA (osteoarthritis)    both hips  . Pericardial effusion    a. micorpeforation s/p lead revision 06/26/15  . Presence of permanent cardiac pacemaker 06/24/2015   St Jude Assurity  . Prostate cancer [facility]) 2010   "external radiation; 40 treatments"  . 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 Severe aortic stenosis    a. s/p TAVR 03/2015. Pre-op  LHC 02/2015: minor nonobstructive CAD.  Marland Kitchen Squamous cell skin cancer    left lower leg  .  Stroke ([place]) 02/19/2014   Small infarct high posterior left frontal lobe on MRI   . TIA (transient ischemic attack) 01/21/2014   5 min spell aphasia without assoc sx     Past Surgical History:  Procedure Laterality Date  . ANTERIOR CERVICAL DECOMP/DISCECTOMY FUSION  ~ 2000   Dr. Louanne Skye  . BACK SURGERY    . CARDIAC CATHETERIZATION N/A 02/20/2015   Procedure: Right/Left Heart Cath and Coronary Angiography;  Surgeon: Sherren Mocha, MD;  Location: [place] [facility];  Service: Cardiovascular;  Laterality: N/A;  . CARDIAC VALVE REPLACEMENT    . CATARACT EXTRACTION W/ INTRAOCULAR LENS  IMPLANT,  BILATERAL  2004; 2010   right; left  . EP IMPLANTABLE DEVICE N/A 06/24/2015   Procedure: Pacemaker Implant;  Surgeon: Evans Lance, MD;  Location: [place] [facility];  Service: Cardiovascular; St Jude Assurity   . EP IMPLANTABLE DEVICE N/A 06/26/2015   Procedure: Lead Revision/Repair;  Surgeon: Will Meredith Leeds, MD;  Location: [place] [facility];  Service: Cardiovascular;  Laterality: N/A;  . INSERT / REPLACE / REMOVE PACEMAKER  06/24/2015  . JOINT REPLACEMENT    . SQUAMOUS CELL CARCINOMA EXCISION Left X 3   "lower leg"  . TEE WITHOUT CARDIOVERSION N/A 03/07/2015   Procedure: TRANSESOPHAGEAL ECHOCARDIOGRAM (TEE);  Surgeon: Dorothy Spark, MD;  Location: [place];  Service: Cardiovascular;  Laterality: N/A;  . TEE WITHOUT CARDIOVERSION N/A 03/25/2015   Procedure: TRANSESOPHAGEAL ECHOCARDIOGRAM (TEE);  Surgeon: Burnell Blanks, MD;  Location: [place];  Service: Open Heart Surgery;  Laterality: N/A;  . TONSILLECTOMY    . TOTAL SHOULDER ARTHROPLASTY Right 2007   "wore it out playing tennis"  . TRANSCATHETER AORTIC VALVE REPLACEMENT, TRANSFEMORAL N/A 03/25/2015   Procedure: TRANSCATHETER AORTIC VALVE REPLACEMENT, TRANSFEMORAL;  Surgeon: Burnell Blanks, MD;  Location: [place];  Service: Open Heart Surgery;  Laterality: N/A;   Medications: Current Outpatient Prescriptions  Medication Sig Dispense Refill  . acetaminophen (TYLENOL) 325 MG tablet Take 1-2 tablets (325-650 mg total) by mouth every 4 (four) hours as needed for mild pain.    Marland Kitchen docusate sodium (COLACE) 100 MG capsule Take 300 mg by mouth at bedtime.     . metoprolol tartrate (LOPRESSOR) 25 MG tablet Take 0.5 tablets (12.5 mg total) by mouth 2 (two) times daily.    . Omega-3 Fatty Acids (FISH OIL) 1000 MG CAPS Take 1,000 mg by mouth 2 (two) times daily.    . tamsulosin (FLOMAX) 0.4 MG CAPS capsule Take 0.4 mg by mouth 3 (three) times a week. Monday, Wednesday, Friday    . warfarin (COUMADIN) 3 MG tablet Take as directed  by coumadin clinic 90 tablet 1   No current facility-administered medications for this visit.    Allergies: No Known Allergies  Social History: The patient  reports that he has never smoked. He has never used smokeless tobacco. He reports that he does not drink alcohol or use drugs.   Family History: The patient's family history includes Brain cancer in his sister; CVA in his father and mother; Cancer in his father; Heart attack in his brother; Heart disease in his brother and mother; Lung cancer in his brother; Prostate cancer in his brother; Stroke in his mother.   Review of Systems: Please see the history of present illness.   Otherwise, the review of systems is positive for none.   All other systems are reviewed and negative.   Physical Exam: VS:  BP 120/60   Pulse 60  Ht 5' 8.5" (1.74 m)   Wt 216 lb (98 kg)   BMI 32.36 kg/m  .  BMI Body mass index is 32.36 kg/m.  Wt Readings from Last 3 Encounters:  11/15/16 216 lb (98 kg)  10/05/16 210 lb 9 oz (95.5 kg)  09/23/16 211 lb (95.7 kg)   General: Pleasant. Elderly male - looks chronically ill but in no acute distress. Left arm dressing in place.   HEENT: Normal.  Neck: Supple, no JVD, carotid bruits, or masses noted.  Cardiac: RRR, no murmur, no rub, healed scar post PM placement, no discharge Respiratory:  Lungs are clear to auscultation bilaterally with normal work of breathing.  GI: Soft and nontender.  MS: No deformity or atrophy. Gait and ROM intact. Using a walker.   Skin: Warm and dry. Color is normal.  Neuro:  Strength and sensation are intact and no gross focal deficits noted.  Psych: Alert, appropriate and with normal affect.  LABORATORY DATA:  EKG:  EKG is ordered today. This demonstrates complete heart block - rate of 40 - reviewed with Dr. Lovena Le here in the office.  Lab Results  Component Value Date   WBC 5.9 07/29/2016   HGB 15.3 07/29/2016   HCT 45.5 07/29/2016   PLT 178.0 07/29/2016   GLUCOSE 105  (H) 07/29/2016   CHOL 210 (H) 07/29/2016   TRIG 171.0 (H) 07/29/2016   HDL 40.30 07/29/2016   LDLDIRECT 134.9 05/03/2013   LDLCALC 136 (H) 07/29/2016   ALT 13 07/29/2016   AST 13 07/29/2016   NA 141 07/29/2016   K 4.2 07/29/2016   CL 106 07/29/2016   CREATININE 1.03 07/29/2016   BUN 29 (H) 07/29/2016   CO2 31 07/29/2016   TSH 2.73 07/29/2016   PSA 0.07 (L) 07/29/2016   INR 2.5 10/21/2016   HGBA1C 6.1 (H) 03/21/2015   Other Studies Reviewed Today:  Echocardiogram 04/08/2016 - Left ventricle: The cavity size was normal. There was moderate   asymmetric septal hypertrophy. Systolic function was normal. The   estimated ejection fraction was in the range of 60% to 65%. Wall   motion was normal; there were no regional wall motion   abnormalities. Doppler parameters are consistent with abnormal   left ventricular relaxation (grade 1 diastolic dysfunction). No   LV outflow tract gradient was measured. - Aortic valve: Bioprosthetic aortic valve s/p transcatheter aortic   valve replacement. The TAVR valve appeared to function normally.   There was no significant regurgitation. Mean gradient (S): 11 mm   Hg. - Mitral valve: Mild systolic anterior motion of the anterior   mitral leaflet. - Left atrium: The atrium was mildly dilated. - Right ventricle: The cavity size was normal. Systolic function   was normal. - Right atrium: The atrium was mildly dilated. - Pulmonary arteries: No complete TR doppler jet so unable to   estimate PA systolic pressure. - Inferior vena cava: The vessel was normal in size. The   respirophasic diameter changes were in the normal range (>= 50%),   consistent with normal central venous pressure.  Impressions:  - Normal LV size with moderate asymmetric septal hypertrophy. EF   60-65%. Mild systolic anterior motion of the mitral valve. No LV   outflow tract gradient measured. Consistent with hypertrophic   cardiomyopathy. Normal RV size and systolic  function. s/p TAVR,   valve appears to function normally.   EKG performed today in 02/12/2016 shows atrial paced rhythm with left axis deviation and left bundle branch block.  Assessment/Plan:  1. H/o Severe AS, s/p TAVR, he is doing great, normal transaortic gradients with mean of 11 mmHg on most recent echocardiogram done on 04/08/2016. Doing great, continues to exercise.  2. Orthostatic hypotension - patient is only on minimal doses of metoprolol that he needs for history of hypertrophic cardiomyopathy. Once again it is reinforced that is for important for him to stay heart rated and acknowledged that he might feel dizzy upon standing after prolonged sitting or laying down.  3. HCM - on BB, tolerating well now post PM placement, BP borderline, unable to uptitrate, no dizziness, syncope  4. Symptomatic 2:1 bloct - s/p PM placement, functioning well, followed by Dr Lovena Le in 06/2016.  5. PAF - asymptomatic, found on PM interrogation, on Coumadin, INR check today, ASA and Plavix were discontinued.  6. Essential HTN - controlled, and rather orthostatic hypotension however we history of HCM he needs to be on beta blocker will continue.  7. Muscle weakness and pain - we discontinued pravastatin, he is 89 and fully functional, is congratulated for old efforts he does with exercise. He is encouraged to continue.  8. HLP - start fish oil only, his LDL was well controlled triglyceride mildly elevated, however patient is 89 and on last year cardiac He had only minimal nonobstructive CAD.  Follow up in 4 months  Signed: Ena Dawley, MD  11/15/2016 9:52 AM  [place] [address] [place], [place]  [address] Phone: [phone] Fax: [phone]

## 2016-12-16 ENCOUNTER — Ambulatory Visit (INDEPENDENT_AMBULATORY_CARE_PROVIDER_SITE_OTHER): Payer: Medicare Other

## 2016-12-16 ENCOUNTER — Encounter (INDEPENDENT_AMBULATORY_CARE_PROVIDER_SITE_OTHER): Payer: Self-pay

## 2016-12-16 DIAGNOSIS — Z952 Presence of prosthetic heart valve: Secondary | ICD-10-CM | POA: Diagnosis not present

## 2016-12-16 DIAGNOSIS — Z5181 Encounter for therapeutic drug level monitoring: Secondary | ICD-10-CM | POA: Diagnosis not present

## 2016-12-16 DIAGNOSIS — I4891 Unspecified atrial fibrillation: Secondary | ICD-10-CM | POA: Diagnosis not present

## 2016-12-16 LAB — POCT INR: INR: 2.4

## 2016-12-21 ENCOUNTER — Ambulatory Visit (INDEPENDENT_AMBULATORY_CARE_PROVIDER_SITE_OTHER): Payer: Medicare Other | Admitting: *Deleted

## 2016-12-21 DIAGNOSIS — I442 Atrioventricular block, complete: Secondary | ICD-10-CM

## 2016-12-21 NOTE — Progress Notes (Signed)
Remote pacemaker transmission.   

## 2016-12-23 LAB — CUP PACEART REMOTE DEVICE CHECK
Battery Remaining Longevity: 115 mo
Battery Remaining Percentage: 95.5 %
Battery Voltage: 2.98 V
Brady Statistic AS VP Percent: 24 %
Brady Statistic AS VS Percent: 1 %
Brady Statistic RA Percent Paced: 74 %
Date Time Interrogation Session: 20180508060025
Implantable Lead Implant Date: 20161108
Implantable Lead Location: 753860
Implantable Pulse Generator Implant Date: 20161108
Lead Channel Impedance Value: 400 Ohm
Lead Channel Pacing Threshold Pulse Width: 0.5 ms
Lead Channel Pacing Threshold Pulse Width: 0.5 ms
Lead Channel Sensing Intrinsic Amplitude: 12 mV
Lead Channel Setting Pacing Amplitude: 2 V
MDC IDC LEAD IMPLANT DT: 20161108
MDC IDC LEAD LOCATION: 753859
MDC IDC MSMT LEADCHNL RA PACING THRESHOLD AMPLITUDE: 0.75 V
MDC IDC MSMT LEADCHNL RA SENSING INTR AMPL: 5 mV
MDC IDC MSMT LEADCHNL RV IMPEDANCE VALUE: 450 Ohm
MDC IDC MSMT LEADCHNL RV PACING THRESHOLD AMPLITUDE: 0.625 V
MDC IDC SET LEADCHNL RV PACING AMPLITUDE: 0.875
MDC IDC SET LEADCHNL RV PACING PULSEWIDTH: 0.5 ms
MDC IDC SET LEADCHNL RV SENSING SENSITIVITY: 5 mV
MDC IDC STAT BRADY AP VP PERCENT: 75 %
MDC IDC STAT BRADY AP VS PERCENT: 1 %
MDC IDC STAT BRADY RV PERCENT PACED: 99 %
Pulse Gen Model: 2240
Pulse Gen Serial Number: 7825693

## 2017-01-11 DIAGNOSIS — Z961 Presence of intraocular lens: Secondary | ICD-10-CM | POA: Diagnosis not present

## 2017-01-11 DIAGNOSIS — H353131 Nonexudative age-related macular degeneration, bilateral, early dry stage: Secondary | ICD-10-CM | POA: Diagnosis not present

## 2017-01-11 DIAGNOSIS — H52203 Unspecified astigmatism, bilateral: Secondary | ICD-10-CM | POA: Diagnosis not present

## 2017-01-26 ENCOUNTER — Ambulatory Visit (INDEPENDENT_AMBULATORY_CARE_PROVIDER_SITE_OTHER): Payer: Medicare Other | Admitting: *Deleted

## 2017-01-26 DIAGNOSIS — I4891 Unspecified atrial fibrillation: Secondary | ICD-10-CM

## 2017-01-26 DIAGNOSIS — Z952 Presence of prosthetic heart valve: Secondary | ICD-10-CM | POA: Diagnosis not present

## 2017-01-26 DIAGNOSIS — Z5181 Encounter for therapeutic drug level monitoring: Secondary | ICD-10-CM

## 2017-01-26 LAB — POCT INR: INR: 2.4

## 2017-02-04 ENCOUNTER — Encounter: Payer: Self-pay | Admitting: Cardiology

## 2017-02-21 ENCOUNTER — Telehealth: Payer: Self-pay | Admitting: Adult Health

## 2017-02-21 DIAGNOSIS — Z0289 Encounter for other administrative examinations: Secondary | ICD-10-CM

## 2017-02-21 NOTE — Telephone Encounter (Signed)
The patient dropped off a resident medical information form Fax to [phone] ATTN: [facility]

## 2017-02-22 ENCOUNTER — Encounter: Payer: Self-pay | Admitting: Cardiology

## 2017-02-22 ENCOUNTER — Encounter (INDEPENDENT_AMBULATORY_CARE_PROVIDER_SITE_OTHER): Payer: Self-pay

## 2017-02-22 ENCOUNTER — Ambulatory Visit (INDEPENDENT_AMBULATORY_CARE_PROVIDER_SITE_OTHER): Payer: Medicare Other | Admitting: Cardiology

## 2017-02-22 VITALS — BP 130/70 | HR 63 | Ht 69.0 in | Wt 215.0 lb

## 2017-02-22 DIAGNOSIS — I421 Obstructive hypertrophic cardiomyopathy: Secondary | ICD-10-CM | POA: Diagnosis not present

## 2017-02-22 NOTE — Progress Notes (Signed)
02/22/2017 [name]   05/14/1924  [id number]  Primary Physician Robert Frees, NP Primary Cardiologist: Dr. Delton Anderson Electrophysiologist: Dr. Ladona Anderson  Reason for Visit/CC: 3 month f/u for HOCM, Aortic Valve Disease and PAF  HPI:  Robert Anderson is a  83 y.o. male who presents today for 3 month follow up visit.  He has a history of severe AS s/p TAVR 03/25/15, HOCM, LBBB, 1st degree AVB, essential HTN, h/o TIA & CVA, PAF now on chronic coumadin, followed in coumadin clinic. He also has a PPM, implanted for symptomatic 2:1 heart block following his TAVR. This is followed by Dr. Ladona Anderson.  LHC 02/2015: minor nonobstructive CAD. 2D echo 04/25/15: vigorous LV systolic function with near-cavity obliteration, moderate LVH, EF 65-70%, grade 1 DD, mod LAE. His primary cardiologist is Dr. Delton Anderson. Last OV with her was 11/15/16. He was stable at that time and denied any symptoms. He was instructed to f/u in 3-4 months. Of note, he has also had some issues with mild orthostatic hypotension. He has been able to tolerate low doses of metoprolol, which he needs for his HOCM.   He presents back today for f/u. He reports he has done well. No CP or dyspnea. No palpitations, dizziness, syncope/ near syncope. He gets around well for his age. He works out every day 30-45 min at a time, at home and at [facility], on the stationary bike and seated elliptical. No exertional symptoms. He denies abnormal bleeding with coumadin. No falls. He is extra cautious with positional changes. He denies any recent symptoms of orthostatic hypotension. BP is well controlled in clinic today at 130/63.  He and his wife and currently transitioning from their home to [facility]. He is happy about this.   Current Meds  Medication Sig  . acetaminophen (TYLENOL) 325 MG tablet Take 1-2 tablets (325-650 mg total) by mouth every 4 (four) hours as needed for mild pain.  Marland Kitchen docusate sodium (COLACE) 100 MG capsule Take 300 mg by mouth at bedtime.     . metoprolol tartrate (LOPRESSOR) 25 MG tablet Take 0.5 tablets (12.5 mg total) by mouth 2 (two) times daily.  . Omega-3 Fatty Acids (FISH OIL) 1000 MG CAPS Take 1,000 mg by mouth 2 (two) times daily.  . tamsulosin (FLOMAX) 0.4 MG CAPS capsule Take 0.4 mg by mouth 3 (three) times a week. Monday, Wednesday, Friday  . warfarin (COUMADIN) 3 MG tablet Take as directed by coumadin clinic   No Known Allergies Past Medical History:  Diagnosis Date  . Atrial fibrillation (HCC)    a. dx after lead revision 06/26/15 >> anticoag not started due to recent pericardial effusion in setting of perforation and lead revision  . Bradycardia    a. HR 30s in clinic 06/2015 - BB discontinued.  . DEGENERATIVE JOINT DISEASE, SPINE   . ERECTILE DYSFUNCTION   . Essential hypertension   . First degree AV block   . HEARING LOSS   . Heart block    s/p Pacemaker  . Hyperlipidemia   . Hypertrophic obstructive cardiomyopathy (HCC)   . LBBB (left bundle branch block)   . OA (osteoarthritis)    both hips  . Pericardial effusion    a. micorpeforation s/p lead revision 06/26/15  . Presence of permanent cardiac pacemaker 06/24/2015   St Jude Assurity  . Prostate cancer [facility]) 2010   "external radiation; 40 treatments"  . S/P TAVR (transcatheter aortic valve replacement) 03/25/2015   29 mm Edwards Sapien 3  transcatheter heart valve placed via open right transfemoral approach  . Severe aortic stenosis    a. s/p TAVR 03/2015. Pre-op  LHC 02/2015: minor nonobstructive CAD.  Marland Kitchen Squamous cell skin cancer    left lower leg  . Stroke (HCC) 02/19/2014   Small infarct high posterior left frontal lobe on MRI   . TIA (transient ischemic attack) 01/21/2014   5 min spell aphasia without assoc sx     Family History  Problem Relation Age of Onset  . Heart disease Mother   . CVA Mother   . Stroke Mother   . CVA Father   . Cancer Father   . Brain cancer Sister   . Prostate cancer Brother   . Heart disease Brother   . Lung cancer  Brother   . Heart attack Brother   . Hypertension Neg Hx    Past Surgical History:  Procedure Laterality Date  . ANTERIOR CERVICAL DECOMP/DISCECTOMY FUSION  ~ 2000   Dr. Otelia Anderson  . BACK SURGERY    . CARDIAC CATHETERIZATION N/A 02/20/2015   Procedure: Right/Left Heart Cath and Coronary Angiography;  Surgeon: Robert Bollman, MD;  Location: [facility] INVASIVE CV LAB;  Service: Cardiovascular;  Laterality: N/A;  . CARDIAC VALVE REPLACEMENT    . CATARACT EXTRACTION W/ INTRAOCULAR LENS  IMPLANT, BILATERAL  2004; 2010   right; left  . EP IMPLANTABLE DEVICE N/A 06/24/2015   Procedure: Pacemaker Implant;  Surgeon: Robert Maw, MD;  Location: [facility] INVASIVE CV LAB;  Service: Cardiovascular; St Jude Assurity   . EP IMPLANTABLE DEVICE N/A 06/26/2015   Procedure: Lead Revision/Repair;  Surgeon: Robert Jorja Loa, MD;  Location: [facility] INVASIVE CV LAB;  Service: Cardiovascular;  Laterality: N/A;  . INSERT / REPLACE / REMOVE PACEMAKER  06/24/2015  . JOINT REPLACEMENT    . SQUAMOUS CELL CARCINOMA EXCISION Left X 3   "lower leg"  . TEE WITHOUT CARDIOVERSION N/A 03/07/2015   Procedure: TRANSESOPHAGEAL ECHOCARDIOGRAM (TEE);  Surgeon: Robert Masson, MD;  Location: [facility] ENDOSCOPY;  Service: Cardiovascular;  Laterality: N/A;  . TEE WITHOUT CARDIOVERSION N/A 03/25/2015   Procedure: TRANSESOPHAGEAL ECHOCARDIOGRAM (TEE);  Surgeon: Robert Hazel, MD;  Location: [facility] OR;  Service: Open Heart Surgery;  Laterality: N/A;  . TONSILLECTOMY    . TOTAL SHOULDER ARTHROPLASTY Right 2007   "wore it out playing tennis"  . TRANSCATHETER AORTIC VALVE REPLACEMENT, TRANSFEMORAL N/A 03/25/2015   Procedure: TRANSCATHETER AORTIC VALVE REPLACEMENT, TRANSFEMORAL;  Surgeon: Robert Hazel, MD;  Location: [facility] OR;  Service: Open Heart Surgery;  Laterality: N/A;   Social History   Social History  . Marital status: Married    Spouse name: N/A  . Number of children: N/A  . Years of education: N/A   Occupational History  . Retired      Social History Main Topics  . Smoking status: Never Smoker  . Smokeless tobacco: Never Used  . Alcohol use No  . Drug use: No  . Sexual activity: Not Currently   Other Topics Concern  . Not on file   Social History Narrative   Patient lives in [place] with his wife. 2nd marriage    4 children    He goes to the [facility] three days a week     Review of Systems: General: negative for chills, fever, night sweats or weight changes.  Cardiovascular: negative for chest pain, dyspnea on exertion, edema, orthopnea, palpitations, paroxysmal nocturnal dyspnea or shortness of breath Dermatological: negative for rash Respiratory: negative for cough or wheezing  Urologic: negative for hematuria Abdominal: negative for nausea, vomiting, diarrhea, bright red blood per rectum, melena, or hematemesis Neurologic: negative for visual changes, syncope, or dizziness All other systems reviewed and are otherwise negative except as noted above.   Physical Exam:  Blood pressure 130/70, pulse 63, height 5\' 9"  (1.753 m), weight 215 lb (97.5 kg).  General appearance: alert, cooperative and no distress Neck: no carotid bruit and no JVD Lungs: clear to auscultation bilaterally Heart: regular rate and rhythm, S1, S2 normal, no murmur, click, rub or gallop Extremities: extremities normal, atraumatic, no cyanosis or edema Pulses: 2+ and symmetric Skin: Skin color, texture, turgor normal. No rashes or lesions Neurologic: Grossly normal  EKG not performed -- personally reviewed   ASSESSMENT AND PLAN:   1. Aortic Valve Disease: s/p TAVR in 2016. His most recent 2D echo 03/2016 showed normal transaortic gradients with mean of 11 mmHg. He is stable w/o CP, dyspnea, syncope/near syncope. No exertional symptoms.   2. HOCM: on BB therapy with metoprolol. He has a PPM. He denies CP, dyspnea, palpitations, syncope/ near syncope.   3. PPM: implanted for 2:1 symptomatic heart block. Followed by Dr. Ladona Anderson. St Jude  device.   4. PAF: RRR on physical exam. Rate is controlled with metoprolol. He is on chronic anticoagulation with Coumadin. INRs are followed in our coumadin clinic. HR currently in the 60s.   5. HTN: controlled on current regimen.   6. HLD: continue conservative medical management with fish oil, given only minimal CAD noted on [facility] in 2016 as well as his advanced age. He has h/o statin intolerance with pravastatin in the past.   7. H/o Orthostatic Hypotension: no recent symptoms. He is cautious with positional changes. No positional dizziness or falls. Pt encouraged to continue to use cation with positional changes to reduce risk of symptoms/falls.    Follow-Up w/ Dr. Delton Anderson in 6 months.   Ashleynicole Mcclees Delmer Islam, MHS [facility] 02/22/2017 2:45 PM

## 2017-02-22 NOTE — Patient Instructions (Signed)
Medication Instructions:  Your physician recommends that you continue on your current medications as directed. Please refer to the Current Medication list given to you today.   Labwork: None ordered  Testing/Procedures: None ordered  Follow-Up: Your physician recommends that you schedule a follow-up appointment in: 6 months with Dr. Meda Coffee  Any Other Special Instructions Will Be Listed Below (If Applicable).     If you need a refill on your cardiac medications before your next appointment, please call your pharmacy.

## 2017-02-22 NOTE — Telephone Encounter (Signed)
Form filled out and placed in Cory's folder for distribution.

## 2017-02-23 ENCOUNTER — Telehealth: Payer: Self-pay | Admitting: Adult Health

## 2017-02-23 NOTE — Telephone Encounter (Signed)
Pt's wife notified to pick up at the front desk.  Faxed to [place] @ [phone].  Received confirmation that the transmission was successful. Original placed at the front desk.  Copy sent to scan and I retained a copy for my records.

## 2017-02-23 NOTE — Telephone Encounter (Signed)
Patient came in to pick up the forms Patient paid $20 form fee

## 2017-03-09 ENCOUNTER — Ambulatory Visit (INDEPENDENT_AMBULATORY_CARE_PROVIDER_SITE_OTHER): Payer: Medicare Other | Admitting: *Deleted

## 2017-03-09 DIAGNOSIS — Z952 Presence of prosthetic heart valve: Secondary | ICD-10-CM | POA: Diagnosis not present

## 2017-03-09 DIAGNOSIS — Z5181 Encounter for therapeutic drug level monitoring: Secondary | ICD-10-CM

## 2017-03-09 DIAGNOSIS — I4891 Unspecified atrial fibrillation: Secondary | ICD-10-CM | POA: Diagnosis not present

## 2017-03-09 LAB — POCT INR: INR: 2.4

## 2017-03-14 ENCOUNTER — Telehealth: Payer: Self-pay | Admitting: Internal Medicine

## 2017-03-14 NOTE — Telephone Encounter (Signed)
New message    Pt is calling asking for a call back about his home monitor. He is asking if it's ok to unplug to move it to [facility] with him.

## 2017-03-14 NOTE — Telephone Encounter (Signed)
Spoke w/ pt and informed him that it will be ok to unplug his monitor and move it to [facility]. Pt verbalized understanding.

## 2017-03-20 ENCOUNTER — Other Ambulatory Visit: Payer: Self-pay | Admitting: Cardiology

## 2017-03-22 ENCOUNTER — Ambulatory Visit (INDEPENDENT_AMBULATORY_CARE_PROVIDER_SITE_OTHER): Payer: Medicare Other | Admitting: *Deleted

## 2017-03-22 DIAGNOSIS — I442 Atrioventricular block, complete: Secondary | ICD-10-CM

## 2017-03-22 NOTE — Progress Notes (Signed)
Remote pacemaker transmission.   

## 2017-03-23 ENCOUNTER — Encounter: Payer: Self-pay | Admitting: Cardiology

## 2017-03-29 LAB — CUP PACEART REMOTE DEVICE CHECK
Battery Remaining Longevity: 115 mo
Battery Remaining Percentage: 95.5 %
Battery Voltage: 2.98 V
Brady Statistic AP VS Percent: 1 %
Brady Statistic AS VS Percent: 1 %
Date Time Interrogation Session: 20180807060013
Implantable Lead Implant Date: 20161108
Implantable Lead Implant Date: 20161108
Implantable Lead Location: 753860
Implantable Pulse Generator Implant Date: 20161108
Lead Channel Impedance Value: 410 Ohm
Lead Channel Pacing Threshold Amplitude: 0.625 V
Lead Channel Pacing Threshold Amplitude: 0.75 V
Lead Channel Pacing Threshold Pulse Width: 0.5 ms
Lead Channel Sensing Intrinsic Amplitude: 5 mV
Lead Channel Setting Sensing Sensitivity: 5 mV
MDC IDC LEAD LOCATION: 753859
MDC IDC MSMT LEADCHNL RA PACING THRESHOLD PULSEWIDTH: 0.5 ms
MDC IDC MSMT LEADCHNL RV IMPEDANCE VALUE: 450 Ohm
MDC IDC MSMT LEADCHNL RV SENSING INTR AMPL: 11.8 mV
MDC IDC SET LEADCHNL RA PACING AMPLITUDE: 2 V
MDC IDC SET LEADCHNL RV PACING AMPLITUDE: 0.875
MDC IDC SET LEADCHNL RV PACING PULSEWIDTH: 0.5 ms
MDC IDC STAT BRADY AP VP PERCENT: 75 %
MDC IDC STAT BRADY AS VP PERCENT: 25 %
MDC IDC STAT BRADY RA PERCENT PACED: 74 %
MDC IDC STAT BRADY RV PERCENT PACED: 99 %
Pulse Gen Serial Number: 7825693

## 2017-04-20 ENCOUNTER — Ambulatory Visit (INDEPENDENT_AMBULATORY_CARE_PROVIDER_SITE_OTHER): Payer: Medicare Other | Admitting: *Deleted

## 2017-04-20 DIAGNOSIS — I4891 Unspecified atrial fibrillation: Secondary | ICD-10-CM

## 2017-04-20 DIAGNOSIS — Z952 Presence of prosthetic heart valve: Secondary | ICD-10-CM | POA: Diagnosis not present

## 2017-04-20 DIAGNOSIS — Z5181 Encounter for therapeutic drug level monitoring: Secondary | ICD-10-CM | POA: Diagnosis not present

## 2017-04-20 LAB — POCT INR: INR: 2.4

## 2017-05-17 NOTE — Telephone Encounter (Signed)
ERROR

## 2017-06-01 ENCOUNTER — Ambulatory Visit (INDEPENDENT_AMBULATORY_CARE_PROVIDER_SITE_OTHER): Payer: Medicare Other | Admitting: *Deleted

## 2017-06-01 DIAGNOSIS — Z5181 Encounter for therapeutic drug level monitoring: Secondary | ICD-10-CM | POA: Diagnosis not present

## 2017-06-01 DIAGNOSIS — I4891 Unspecified atrial fibrillation: Secondary | ICD-10-CM | POA: Diagnosis not present

## 2017-06-01 DIAGNOSIS — Z952 Presence of prosthetic heart valve: Secondary | ICD-10-CM | POA: Diagnosis not present

## 2017-06-01 LAB — POCT INR: INR: 2.1

## 2017-06-06 ENCOUNTER — Other Ambulatory Visit: Payer: Self-pay | Admitting: Cardiology

## 2017-06-21 ENCOUNTER — Ambulatory Visit (INDEPENDENT_AMBULATORY_CARE_PROVIDER_SITE_OTHER): Payer: Medicare Other | Admitting: *Deleted

## 2017-06-21 DIAGNOSIS — I442 Atrioventricular block, complete: Secondary | ICD-10-CM

## 2017-06-21 NOTE — Progress Notes (Signed)
Remote pacemaker transmission.   

## 2017-06-23 ENCOUNTER — Encounter: Payer: Self-pay | Admitting: Cardiology

## 2017-06-29 ENCOUNTER — Ambulatory Visit: Payer: Medicare Other | Admitting: Internal Medicine

## 2017-06-29 ENCOUNTER — Encounter: Payer: Self-pay | Admitting: Internal Medicine

## 2017-06-29 VITALS — BP 120/68 | HR 61 | Ht 69.0 in | Wt 214.2 lb

## 2017-06-29 DIAGNOSIS — Z952 Presence of prosthetic heart valve: Secondary | ICD-10-CM

## 2017-06-29 DIAGNOSIS — Z950 Presence of cardiac pacemaker: Secondary | ICD-10-CM

## 2017-06-29 DIAGNOSIS — I10 Essential (primary) hypertension: Secondary | ICD-10-CM

## 2017-06-29 DIAGNOSIS — I442 Atrioventricular block, complete: Secondary | ICD-10-CM | POA: Diagnosis not present

## 2017-06-29 NOTE — Progress Notes (Signed)
HPI Robert Anderson returns today for ongoing evaluation of his PPM. He is a pleasant 83 yo man with CHB, s/p PPM insertion who has recently moved from his house to [facility]. He feels well and is exercising regularly. No chest pain or sob. No edema.  No Known Allergies   Current Outpatient Medications  Medication Sig Dispense Refill  . acetaminophen (TYLENOL) 325 MG tablet Take 1-2 tablets (325-650 mg total) by mouth every 4 (four) hours as needed for mild pain.    Marland Kitchen docusate sodium (COLACE) 100 MG capsule Take 300 mg by mouth at bedtime.     . metoprolol tartrate (LOPRESSOR) 25 MG tablet Take 0.5 tablets (12.5 mg total) by mouth 2 (two) times daily.    . metoprolol tartrate (LOPRESSOR) 25 MG tablet TAKE 1/2 TABLET BY MOUTH TWICE A DAY 90 tablet 3  . Omega-3 Fatty Acids (FISH OIL) 1000 MG CAPS Take 1,000 mg by mouth 2 (two) times daily.    . tamsulosin (FLOMAX) 0.4 MG CAPS capsule Take 0.4 mg by mouth 3 (three) times a week. Monday, Wednesday, Friday    . warfarin (COUMADIN) 3 MG tablet TAKE AS DIRECTED BY COUMADIN CLINIC 90 tablet 1   No current facility-administered medications for this visit.      Past Medical History:  Diagnosis Date  . Atrial fibrillation ([place])    a. dx after lead revision 06/26/15 >> anticoag not started due to recent pericardial effusion in setting of perforation and lead revision  . Bradycardia    a. HR 30s in clinic 06/2015 - BB discontinued.  . DEGENERATIVE JOINT DISEASE, SPINE   . ERECTILE DYSFUNCTION   . Essential hypertension   . First degree AV block   . HEARING LOSS   . Heart block    s/p Pacemaker  . Hyperlipidemia   . Hypertrophic obstructive cardiomyopathy ([place])   . LBBB (left bundle branch block)   . OA (osteoarthritis)    both hips  . Pericardial effusion    a. micorpeforation s/p lead revision 06/26/15  . Presence of permanent cardiac pacemaker 06/24/2015   St Jude Assurity  . Prostate cancer [facility]) 2010   "external radiation; 40  treatments"  . 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 Severe aortic stenosis    a. s/p TAVR 03/2015. Pre-op  LHC 02/2015: minor nonobstructive CAD.  Marland Kitchen Squamous cell skin cancer    left lower leg  . Stroke ([place]) 02/19/2014   Small infarct high posterior left frontal lobe on MRI   . TIA (transient ischemic attack) 01/21/2014   5 min spell aphasia without assoc sx      ROS:   All systems reviewed and negative except as noted in the HPI.   Past Surgical History:  Procedure Laterality Date  . ANTERIOR CERVICAL DECOMP/DISCECTOMY FUSION  ~ 2000   Dr. Louanne Skye  . BACK SURGERY    . CARDIAC VALVE REPLACEMENT    . CATARACT EXTRACTION W/ INTRAOCULAR LENS  IMPLANT, BILATERAL  2004; 2010   right; left  . INSERT / REPLACE / REMOVE PACEMAKER  06/24/2015  . JOINT REPLACEMENT    . SQUAMOUS CELL CARCINOMA EXCISION Left X 3   "lower leg"  . TONSILLECTOMY    . TOTAL SHOULDER ARTHROPLASTY Right 2007   "wore it out playing tennis"     Family History  Problem Relation Age of Onset  .  Heart disease Mother   . CVA Mother   . Stroke Mother   . CVA Father   . Cancer Father   . Brain cancer Sister   . Prostate cancer Brother   . Heart disease Brother   . Lung cancer Brother   . Heart attack Brother   . Hypertension Neg Hx      Social History   Socioeconomic History  . Marital status: Married    Spouse name: Not on file  . Number of children: Not on file  . Years of education: Not on file  . Highest education level: Not on file  Social Needs  . Financial resource strain: Not on file  . Food insecurity - worry: Not on file  . Food insecurity - inability: Not on file  . Transportation needs - medical: Not on file  . Transportation needs - non-medical: Not on file  Occupational History  . Occupation: Retired  Tobacco Use  . Smoking status: Never Smoker  . Smokeless tobacco:  Never Used  Substance and Sexual Activity  . Alcohol use: No    Alcohol/week: 0.0 oz  . Drug use: No  . Sexual activity: Not Currently  Other Topics Concern  . Not on file  Social History Narrative   Patient lives in [place] with his wife. 2nd marriage    4 children    He [facility] the [facility] three days a [facility]     BP 120/68   Pulse 61   Ht 5\' 9"  (1.753 m)   Wt 214 lb 3.2 oz (97.2 kg)   SpO2 93%   BMI 31.63 kg/m   Physical Exam:  Well appearing NAD HEENT: Unremarkable Neck:  No JVD, no thyromegally Lymphatics:  No adenopathy Back:  No CVA tenderness Lungs:  Clear with no wheezes HEART:  Regular rate rhythm, no murmurs, no rubs, no clicks Abd:  soft, positive bowel sounds, no organomegally, no rebound, no guarding Ext:  2 plus pulses, no edema, no cyanosis, no clubbing Skin:  No rashes no nodules Neuro:  CN II through XII intact, motor grossly intact   DEVICE  Normal device function.  See PaceArt for details.   Assess/Plan: 1. CHB - he is s/p PPM and is asymptomatic. He will undergo watchful waiting. 2. PPM - his St. Jude DDD device is working normally.  3. Hypertension - he is encouraged to increase his physical activity and reduce his salt intake. Weight loss was also discussed.  Cristopher Peru, M.D.

## 2017-06-29 NOTE — Patient Instructions (Signed)
Medication Instructions:  Your physician recommends that you continue on your current medications as directed. Please refer to the Current Medication list given to you today.  Labwork: None ordered.  Testing/Procedures: None ordered.  Follow-Up: Your physician wants you to follow-up in: one year with Dr. Lovena Le.   You will receive a reminder letter in the mail two months in advance. If you don't receive a letter, please call our office to schedule the follow-up appointment.  Remote monitoring is used to monitor your Pacemaker from home. This monitoring reduces the number of office visits required to check your device to one time per year. It allows [place] to keep an eye on the functioning of your device to ensure it is working properly. You are scheduled for a device check from home on 09/20/2016. You may send your transmission at any time that day. If you have a wireless device, the transmission will be sent automatically. After your physician reviews your transmission, you will receive a postcard with your next transmission date.    Any Other Special Instructions Will Be Listed Below (If Applicable).     If you need a refill on your cardiac medications before your next appointment, please call your pharmacy.

## 2017-07-05 LAB — CUP PACEART INCLINIC DEVICE CHECK
Battery Remaining Longevity: 111 mo
Battery Voltage: 2.98 V
Brady Statistic RA Percent Paced: 74 %
Date Time Interrogation Session: 20181114195731
Implantable Lead Implant Date: 20161108
Implantable Lead Location: 753859
Implantable Pulse Generator Implant Date: 20161108
Lead Channel Impedance Value: 425 Ohm
Lead Channel Impedance Value: 450 Ohm
Lead Channel Pacing Threshold Amplitude: 0.5 V
Lead Channel Pacing Threshold Amplitude: 0.75 V
Lead Channel Pacing Threshold Pulse Width: 0.5 ms
Lead Channel Pacing Threshold Pulse Width: 0.5 ms
Lead Channel Sensing Intrinsic Amplitude: 12 mV
Lead Channel Setting Pacing Amplitude: 0.875
MDC IDC LEAD IMPLANT DT: 20161108
MDC IDC LEAD LOCATION: 753860
MDC IDC MSMT LEADCHNL RA SENSING INTR AMPL: 5 mV
MDC IDC MSMT LEADCHNL RV PACING THRESHOLD AMPLITUDE: 0.5 V
MDC IDC MSMT LEADCHNL RV PACING THRESHOLD PULSEWIDTH: 0.5 ms
MDC IDC PG SERIAL: 7825693
MDC IDC SET LEADCHNL RA PACING AMPLITUDE: 2 V
MDC IDC SET LEADCHNL RV PACING PULSEWIDTH: 0.5 ms
MDC IDC SET LEADCHNL RV SENSING SENSITIVITY: 5 mV
MDC IDC STAT BRADY RV PERCENT PACED: 99.2 %
Pulse Gen Model: 2240

## 2017-07-10 LAB — CUP PACEART REMOTE DEVICE CHECK
Battery Remaining Percentage: 95.5 %
Battery Voltage: 2.98 V
Brady Statistic AS VP Percent: 25 %
Brady Statistic RA Percent Paced: 74 %
Date Time Interrogation Session: 20181106070014
Implantable Lead Implant Date: 20161108
Implantable Pulse Generator Implant Date: 20161108
Lead Channel Impedance Value: 410 Ohm
Lead Channel Pacing Threshold Amplitude: 0.75 V
Lead Channel Pacing Threshold Pulse Width: 0.5 ms
Lead Channel Sensing Intrinsic Amplitude: 5 mV
Lead Channel Setting Pacing Amplitude: 0.875
Lead Channel Setting Sensing Sensitivity: 5 mV
MDC IDC LEAD IMPLANT DT: 20161108
MDC IDC LEAD LOCATION: 753859
MDC IDC LEAD LOCATION: 753860
MDC IDC MSMT BATTERY REMAINING LONGEVITY: 114 mo
MDC IDC MSMT LEADCHNL RA PACING THRESHOLD PULSEWIDTH: 0.5 ms
MDC IDC MSMT LEADCHNL RV IMPEDANCE VALUE: 440 Ohm
MDC IDC MSMT LEADCHNL RV PACING THRESHOLD AMPLITUDE: 0.625 V
MDC IDC MSMT LEADCHNL RV SENSING INTR AMPL: 12 mV
MDC IDC SET LEADCHNL RA PACING AMPLITUDE: 2 V
MDC IDC SET LEADCHNL RV PACING PULSEWIDTH: 0.5 ms
MDC IDC STAT BRADY AP VP PERCENT: 74 %
MDC IDC STAT BRADY AP VS PERCENT: 1 %
MDC IDC STAT BRADY AS VS PERCENT: 1 %
MDC IDC STAT BRADY RV PERCENT PACED: 99 %
Pulse Gen Serial Number: 7825693

## 2017-07-13 ENCOUNTER — Ambulatory Visit (INDEPENDENT_AMBULATORY_CARE_PROVIDER_SITE_OTHER): Payer: Medicare Other | Admitting: *Deleted

## 2017-07-13 DIAGNOSIS — Z5181 Encounter for therapeutic drug level monitoring: Secondary | ICD-10-CM | POA: Diagnosis not present

## 2017-07-13 DIAGNOSIS — Z8673 Personal history of transient ischemic attack (TIA), and cerebral infarction without residual deficits: Secondary | ICD-10-CM

## 2017-07-13 DIAGNOSIS — Z952 Presence of prosthetic heart valve: Secondary | ICD-10-CM | POA: Diagnosis not present

## 2017-07-13 DIAGNOSIS — I4891 Unspecified atrial fibrillation: Secondary | ICD-10-CM

## 2017-07-13 LAB — POCT INR: INR: 2.4

## 2017-07-13 NOTE — Patient Instructions (Signed)
Continue taking the same dosage 1 tablet (3mg ) daily.  Recheck INR in 6 weeks. Coumadin Clinic [phone] Main [phone]

## 2017-08-02 ENCOUNTER — Other Ambulatory Visit: Payer: Self-pay | Admitting: Family Medicine

## 2017-08-03 ENCOUNTER — Encounter: Payer: Medicare Other | Admitting: Adult Health

## 2017-08-03 DIAGNOSIS — C44619 Basal cell carcinoma of skin of left upper limb, including shoulder: Secondary | ICD-10-CM | POA: Diagnosis not present

## 2017-08-03 DIAGNOSIS — L570 Actinic keratosis: Secondary | ICD-10-CM | POA: Diagnosis not present

## 2017-08-03 DIAGNOSIS — Z85828 Personal history of other malignant neoplasm of skin: Secondary | ICD-10-CM | POA: Diagnosis not present

## 2017-08-03 DIAGNOSIS — D1801 Hemangioma of skin and subcutaneous tissue: Secondary | ICD-10-CM | POA: Diagnosis not present

## 2017-08-03 DIAGNOSIS — L821 Other seborrheic keratosis: Secondary | ICD-10-CM | POA: Diagnosis not present

## 2017-08-04 ENCOUNTER — Ambulatory Visit (INDEPENDENT_AMBULATORY_CARE_PROVIDER_SITE_OTHER): Payer: Medicare Other | Admitting: Adult Health

## 2017-08-04 ENCOUNTER — Encounter: Payer: Self-pay | Admitting: Adult Health

## 2017-08-04 VITALS — BP 118/70 | Temp 97.4°F | Ht 68.75 in | Wt 215.0 lb

## 2017-08-04 DIAGNOSIS — I10 Essential (primary) hypertension: Secondary | ICD-10-CM | POA: Diagnosis not present

## 2017-08-04 DIAGNOSIS — E785 Hyperlipidemia, unspecified: Secondary | ICD-10-CM

## 2017-08-04 DIAGNOSIS — Z0000 Encounter for general adult medical examination without abnormal findings: Secondary | ICD-10-CM

## 2017-08-04 LAB — CBC WITH DIFFERENTIAL/PLATELET
BASOS ABS: 0 10*3/uL (ref 0.0–0.1)
Basophils Relative: 0.8 % (ref 0.0–3.0)
EOS ABS: 0.1 10*3/uL (ref 0.0–0.7)
Eosinophils Relative: 2 % (ref 0.0–5.0)
HEMATOCRIT: 46.2 % (ref 39.0–52.0)
HEMOGLOBIN: 15 g/dL (ref 13.0–17.0)
LYMPHS PCT: 28.9 % (ref 12.0–46.0)
Lymphs Abs: 1.5 10*3/uL (ref 0.7–4.0)
MCHC: 32.4 g/dL (ref 30.0–36.0)
MCV: 91.6 fl (ref 78.0–100.0)
MONOS PCT: 8.2 % (ref 3.0–12.0)
Monocytes Absolute: 0.4 10*3/uL (ref 0.1–1.0)
NEUTROS ABS: 3 10*3/uL (ref 1.4–7.7)
Neutrophils Relative %: 60.1 % (ref 43.0–77.0)
PLATELETS: 181 10*3/uL (ref 150.0–400.0)
RBC: 5.05 Mil/uL (ref 4.22–5.81)
RDW: 13.3 % (ref 11.5–15.5)
WBC: 5 10*3/uL (ref 4.0–10.5)

## 2017-08-04 LAB — COMPREHENSIVE METABOLIC PANEL
ALBUMIN: 3.9 g/dL (ref 3.5–5.2)
ALK PHOS: 60 U/L (ref 39–117)
ALT: 13 U/L (ref 0–53)
AST: 14 U/L (ref 0–37)
BILIRUBIN TOTAL: 0.8 mg/dL (ref 0.2–1.2)
BUN: 22 mg/dL (ref 6–23)
CALCIUM: 9.3 mg/dL (ref 8.4–10.5)
CO2: 30 meq/L (ref 19–32)
CREATININE: 1.01 mg/dL (ref 0.40–1.50)
Chloride: 104 mEq/L (ref 96–112)
GFR: 73.58 mL/min (ref >60.00)
Glucose, Bld: 108 mg/dL — ABNORMAL HIGH (ref 70–99)
Potassium: 4.1 mEq/L (ref 3.5–5.1)
Sodium: 141 mEq/L (ref 135–145)
TOTAL PROTEIN: 6.2 g/dL (ref 6.0–8.3)

## 2017-08-04 NOTE — Progress Notes (Signed)
Subjective:    Patient ID: Robert Anderson, male    DOB: May 02, 1924, 83 y.o.   MRN: [id number]  HPI Patient presents for yearly preventative medicine examination. 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  He takes Metoprolol 12.5 mg BID   He is on warfarin 3 mg for PAF. He also has a PPM, implanted for symptomatic 2:1 heart block following his TAVR. This is followed by Dr. Lovena Le and Dr. Meda Coffee. Saw Cardiology in November.   All immunizations and health maintenance protocols were reviewed with the patient and needed orders were placed. He is up to date on vaccinations   Appropriate screening laboratory values were ordered for the patient including screening of hyperlipidemia, renal function and hepatic function.  Medication reconciliation,  past medical history, social history, problem list and allergies were reviewed in detail with the patient  Goals were established with regard to weight loss, exercise, and  diet in compliance with medications. He continues to eat healthy and exercises multiple times a week. He is riding a stationary bike for 15 minutes daily.   End of life planning was discussed.He has an advanced directive and living will.   He reports that he and his wife have moved to a [place]. They moved about two months ago. He is enjoying it   Overall, he is feeling well. He has no  acute complaints today .    Review of Systems  Constitutional: Negative.   HENT: Positive for hearing loss.   Respiratory: Negative.   Cardiovascular: Negative.   Gastrointestinal: Negative.   Endocrine: Negative.   Genitourinary: Negative.   Musculoskeletal: Positive for arthralgias and gait problem.  Skin: Negative.   Allergic/Immunologic: Negative.   Hematological: Negative.   Psychiatric/Behavioral: Negative.    Past Medical History:  Diagnosis Date  . Atrial fibrillation ([place])    a. dx after lead revision 06/26/15 >> anticoag not started due to recent pericardial effusion in setting of perforation and lead revision  . Bradycardia    a. HR 30s in clinic 06/2015 - BB discontinued.  . DEGENERATIVE JOINT DISEASE, SPINE   . ERECTILE DYSFUNCTION   . Essential hypertension   . First degree AV block   . HEARING LOSS   . Heart block    s/p Pacemaker  . Hyperlipidemia   . Hypertrophic obstructive cardiomyopathy ([place])   . LBBB (left bundle branch block)   . OA (osteoarthritis)    both hips  . Pericardial effusion    a. micorpeforation s/p lead revision 06/26/15  . Presence of permanent cardiac pacemaker 06/24/2015   St Jude Assurity  . Prostate cancer [facility]) 2010   "external radiation; 40 treatments"  . 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 Severe aortic stenosis    a. s/p TAVR 03/2015. Pre-op  LHC 02/2015: minor nonobstructive CAD.  Marland Kitchen Squamous cell skin cancer    left lower leg  . Stroke ([place]) 02/19/2014   Small infarct high  posterior left frontal lobe on MRI   . TIA (transient ischemic attack) 01/21/2014   5 min spell aphasia without assoc sx      Social History   Socioeconomic History  . Marital status: Married    Spouse name: Not on file  . Number of children: Not on file  . Years of education: Not on file  . Highest education level: Not on file    Social Needs  . Financial resource strain: Not on file  . Food insecurity - worry: Not on file  . Food insecurity - inability: Not on file  . Transportation needs - medical: Not on file  . Transportation needs - non-medical: Not on file  Occupational History  . Occupation: Retired  Tobacco Use  . Smoking status: Never Smoker  . Smokeless tobacco: Never Used  Substance and Sexual Activity  . Alcohol use: No    Alcohol/week: 0.0 oz  . Drug use: No  . Sexual activity: Not Currently  Other Topics Concern  . Not on file  Social History Narrative   Patient lives in [place] with his wife. 2nd marriage    4 children    [facility] to [facility] three days a week    Past Surgical History:  Procedure Laterality Date  . ANTERIOR CERVICAL DECOMP/DISCECTOMY FUSION  ~ 2000   Dr. Louanne Skye  . BACK SURGERY    . CARDIAC CATHETERIZATION N/A 02/20/2015   Procedure: Right/Left Heart Cath and Coronary Angiography;  Surgeon: Sherren Mocha, MD;  Location: [place] [facility];  Service: Cardiovascular;  Laterality: N/A;  . CARDIAC VALVE REPLACEMENT    . CATARACT EXTRACTION W/ INTRAOCULAR LENS  IMPLANT, BILATERAL  2004; 2010   right; left  . EP IMPLANTABLE DEVICE N/A 06/24/2015   Procedure: Pacemaker Implant;  Surgeon: Evans Lance, MD;  Location: [place] [facility];  Service: Cardiovascular; St Jude Assurity   . EP IMPLANTABLE DEVICE N/A 06/26/2015   Procedure: Lead Revision/Repair;  Surgeon: Will Meredith Leeds, MD;  Location: [place] [facility];  Service: Cardiovascular;  Laterality: N/A;  . INSERT / REPLACE / REMOVE PACEMAKER  06/24/2015  . JOINT REPLACEMENT    . SQUAMOUS CELL CARCINOMA EXCISION Left X 3   "lower leg"  . TEE WITHOUT CARDIOVERSION N/A 03/07/2015   Procedure: TRANSESOPHAGEAL ECHOCARDIOGRAM (TEE);  Surgeon: Dorothy Spark, MD;  Location: [place];  Service: Cardiovascular;  Laterality: N/A;  . TEE WITHOUT CARDIOVERSION N/A 03/25/2015   Procedure: TRANSESOPHAGEAL ECHOCARDIOGRAM  (TEE);  Surgeon: Burnell Blanks, MD;  Location: [place];  Service: Open Heart Surgery;  Laterality: N/A;  . TONSILLECTOMY    . TOTAL SHOULDER ARTHROPLASTY Right 2007   "wore it out playing tennis"  . TRANSCATHETER AORTIC VALVE REPLACEMENT, TRANSFEMORAL N/A 03/25/2015   Procedure: TRANSCATHETER AORTIC VALVE REPLACEMENT, TRANSFEMORAL;  Surgeon: Burnell Blanks, MD;  Location: [place];  Service: Open Heart Surgery;  Laterality: N/A;    Family History  Problem Relation Age of Onset  . Heart disease Mother   . CVA Mother   . Stroke Mother   . CVA Father   . Cancer Father   . Brain cancer Sister   . Prostate cancer Brother   . Heart disease Brother   . Lung cancer Brother   . Heart attack Brother   . Hypertension Neg Hx     No Known Allergies  Current Outpatient Medications on File Prior to Visit  Medication Sig Dispense Refill  . docusate sodium (COLACE) 100 MG  capsule Take 300 mg by mouth at bedtime.     . metoprolol tartrate (LOPRESSOR) 25 MG tablet TAKE 1/2 TABLET BY MOUTH TWICE A DAY 90 tablet 3  . Omega-3 Fatty Acids (FISH OIL) 1000 MG CAPS Take 1,000 mg by mouth 2 (two) times daily.    . tamsulosin (FLOMAX) 0.4 MG CAPS capsule TAKE ONE CAPSULE BY MOUTH 3 TIMES A WEEK ON MONDAYS, WEDNESDAYS, AND FRIDAYS 100 capsule 4  . warfarin (COUMADIN) 3 MG tablet TAKE AS DIRECTED BY COUMADIN CLINIC 90 tablet 1  . acetaminophen (TYLENOL) 325 MG tablet Take 1-2 tablets (325-650 mg total) by mouth every 4 (four) hours as needed for mild pain. (Patient not taking: Reported on 08/04/2017)     No current facility-administered medications on file prior to visit.     BP 118/70 (BP Location: Left Arm)   Temp (!) 97.4 F (36.3 C) (Oral)   Ht 5' 8.75" (1.746 m)   Wt 215 lb (97.5 kg)   BMI 31.98 kg/m       Objective:   Physical Exam  Constitutional: He is oriented to person, place, and time. He appears well-developed and well-nourished. No distress.  HENT:  Head: Normocephalic and  atraumatic.  Right Ear: External ear normal.  Left Ear: External ear normal.  Nose: Nose normal.  Mouth/Throat: Oropharynx is clear and moist. No oropharyngeal exudate.  Eyes: Conjunctivae and EOM are normal. Pupils are equal, round, and reactive to light. Right eye exhibits no discharge. Left eye exhibits no discharge. No scleral icterus.  Neck: Normal range of motion. Neck supple. No JVD present. Carotid bruit is not present. No tracheal deviation present. No thyroid mass and no thyromegaly present.  Cardiovascular: Normal rate, regular rhythm, normal heart sounds and intact distal pulses. Exam reveals no gallop and no friction rub.  No murmur heard. Pulmonary/Chest: Effort normal and breath sounds normal. No stridor. No respiratory distress. He has no wheezes. He has no rales. He exhibits no tenderness.  Abdominal: Soft. Bowel sounds are normal. He exhibits no distension and no mass. There is no tenderness. There is no rebound and no guarding.  Musculoskeletal: Normal range of motion. He exhibits no edema, tenderness or deformity.  Walks with a rolling walker   Lymphadenopathy:    He has no cervical adenopathy.  Neurological: He is alert and oriented to person, place, and time. He has normal reflexes. He displays normal reflexes. No cranial nerve deficit. He exhibits normal muscle tone. Coordination normal.  Skin: Skin is warm and dry. No rash noted. He is not diaphoretic. No erythema.  Psychiatric: He has a normal mood and affect. His behavior is normal. Judgment and thought content normal.  Nursing note and vitals reviewed.     Assessment & Plan:  1. Routine general medical examination at a health care facility - Continue to exercise and eat healthy  - Follow up in 1 year or sooner if needed - CBC with Differential/Platelet - CMP  2. Essential hypertension - Well controlled. No change in medications - CBC with Differential/Platelet - CMP  3. Hyperlipidemia, unspecified  hyperlipidemia type - Takes fish oil.  - CBC with Differential/Platelet - CMP   Dorothyann Peng, NP

## 2017-08-11 ENCOUNTER — Encounter: Payer: Self-pay | Admitting: Family Medicine

## 2017-08-24 ENCOUNTER — Ambulatory Visit (INDEPENDENT_AMBULATORY_CARE_PROVIDER_SITE_OTHER): Payer: Medicare Other | Admitting: Pharmacist Clinician (PhC)/ Clinical Pharmacy Specialist

## 2017-08-24 DIAGNOSIS — Z952 Presence of prosthetic heart valve: Secondary | ICD-10-CM | POA: Diagnosis not present

## 2017-08-24 DIAGNOSIS — I4891 Unspecified atrial fibrillation: Secondary | ICD-10-CM | POA: Diagnosis not present

## 2017-08-24 DIAGNOSIS — Z5181 Encounter for therapeutic drug level monitoring: Secondary | ICD-10-CM | POA: Diagnosis not present

## 2017-08-24 LAB — POCT INR: INR: 2.5

## 2017-08-29 DIAGNOSIS — H903 Sensorineural hearing loss, bilateral: Secondary | ICD-10-CM | POA: Diagnosis not present

## 2017-09-05 ENCOUNTER — Telehealth: Payer: Self-pay | Admitting: Adult Health

## 2017-09-05 NOTE — Telephone Encounter (Signed)
Copied from [place]. Topic: General - Other >> Sep 05, 2017  2:23 PM Darl Householder, RMA wrote: Reason for CRM: Dallie Piles from [facility] is requesting orders faxed to [phone] for PT/OT to evaluate and treat, please return call to [place] at [phone]

## 2017-09-06 NOTE — Telephone Encounter (Signed)
Ok for verbal orders ?

## 2017-09-06 NOTE — Telephone Encounter (Signed)
Robert Anderson, I have to write an order.  Will need dx code(s).  Please advise.

## 2017-09-06 NOTE — Telephone Encounter (Signed)
I would imagine it is for gait instability but I am not 100% sure. Looks like the facility started this

## 2017-09-07 ENCOUNTER — Encounter: Payer: Self-pay | Admitting: Family Medicine

## 2017-09-07 NOTE — Telephone Encounter (Signed)
Orders faxed.  No further action required.  Will close note.

## 2017-09-20 ENCOUNTER — Ambulatory Visit (INDEPENDENT_AMBULATORY_CARE_PROVIDER_SITE_OTHER): Payer: Medicare Other | Admitting: *Deleted

## 2017-09-20 DIAGNOSIS — I442 Atrioventricular block, complete: Secondary | ICD-10-CM

## 2017-09-20 NOTE — Progress Notes (Signed)
Remote pacemaker transmission.   

## 2017-09-21 ENCOUNTER — Encounter: Payer: Self-pay | Admitting: Cardiology

## 2017-09-27 ENCOUNTER — Ambulatory Visit: Payer: Medicare Other | Admitting: Cardiology

## 2017-09-27 ENCOUNTER — Encounter (INDEPENDENT_AMBULATORY_CARE_PROVIDER_SITE_OTHER): Payer: Self-pay

## 2017-09-27 ENCOUNTER — Encounter: Payer: Self-pay | Admitting: Cardiology

## 2017-09-27 VITALS — BP 120/74 | HR 65 | Ht 68.75 in | Wt 217.0 lb

## 2017-09-27 DIAGNOSIS — Z950 Presence of cardiac pacemaker: Secondary | ICD-10-CM

## 2017-09-27 DIAGNOSIS — I10 Essential (primary) hypertension: Secondary | ICD-10-CM | POA: Diagnosis not present

## 2017-09-27 DIAGNOSIS — Z952 Presence of prosthetic heart valve: Secondary | ICD-10-CM | POA: Diagnosis not present

## 2017-09-27 DIAGNOSIS — I442 Atrioventricular block, complete: Secondary | ICD-10-CM

## 2017-09-27 DIAGNOSIS — I421 Obstructive hypertrophic cardiomyopathy: Secondary | ICD-10-CM | POA: Diagnosis not present

## 2017-09-27 DIAGNOSIS — R5381 Other malaise: Secondary | ICD-10-CM | POA: Diagnosis not present

## 2017-09-27 NOTE — Progress Notes (Signed)
10/02/2017 Robert Anderson   January 19, 1924  [id number]  Primary Physician Dorothyann Peng, NP Primary Cardiologist: Dr. Meda Coffee Electrophysiologist: Dr. Lovena Le  Reason for Visit/CC: 3 month f/u for HOCM, Aortic Valve Disease and PAF  HPI:  Robert Anderson is a  84 y.o. male who presents today for 3 month follow up visit.  He has a history of severe AS s/p TAVR 03/25/15, HOCM, LBBB, 1st degree AVB, essential HTN, h/o TIA & CVA, PAF now on chronic coumadin, followed in coumadin clinic. He also has a PPM, implanted for symptomatic 2:1 heart block following his TAVR. This is followed by Dr. Lovena Le.  LHC 02/2015: minor nonobstructive CAD. 2D echo 04/25/15: vigorous LV systolic function with near-cavity obliteration, moderate LVH, EF 65-70%, grade 1 DD, mod LAE. His primary cardiologist is Dr. Meda Coffee. Last OV with her was 11/15/16. He was stable at that time and denied any symptoms. He was instructed to f/u in 3-4 months. Of note, he has also had some issues with mild orthostatic hypotension. He has been able to tolerate low doses of metoprolol, which he needs for his HOCM.   12/01/16 - He presents back today for f/u. He reports he has done well. No CP or dyspnea. No palpitations, dizziness, syncope/ near syncope. He gets around well for his age. He works out every day 30-45 min at a time, at home and at [employer], on the stationary bike and seated elliptical. No exertional symptoms. He denies abnormal bleeding with coumadin. No falls. He is extra cautious with positional changes. He denies any recent symptoms of orthostatic hypotension. BP is well controlled in clinic today at 130/63.  He and his wife and currently transitioning from their home to [place]. He is happy about this.   09/27/2017 - 1 yaer follow up, Robert Anderson feels and looks great, he exercises several times a week at his retirement home, Has no palpitations, no LE edema, no dizziness or syncope. No chest pain or DOE. He is being followed by Dr  Lovena Le for PM chcekups, the last one in November 2018 and functioning well.  Current Meds  Medication Sig  . acetaminophen (TYLENOL) 325 MG tablet Take 1-2 tablets (325-650 mg total) by mouth every 4 (four) hours as needed for mild pain.  Marland Kitchen docusate sodium (COLACE) 100 MG capsule Take 300 mg by mouth at bedtime.   . metoprolol tartrate (LOPRESSOR) 25 MG tablet TAKE 1/2 TABLET BY MOUTH TWICE A DAY  . Omega-3 Fatty Acids (FISH OIL) 1000 MG CAPS Take 1,000 mg by mouth 2 (two) times daily.  . tamsulosin (FLOMAX) 0.4 MG CAPS capsule TAKE ONE CAPSULE BY MOUTH 3 TIMES A WEEK ON MONDAYS, WEDNESDAYS, AND FRIDAYS  . warfarin (COUMADIN) 3 MG tablet TAKE AS DIRECTED BY COUMADIN CLINIC   No Known Allergies Past Medical History:  Diagnosis Date  . Atrial fibrillation ([place])    a. dx after lead revision 06/26/15 >> anticoag not started due to recent pericardial effusion in setting of perforation and lead revision  . Bradycardia    a. HR 30s in clinic 06/2015 - BB discontinued.  . DEGENERATIVE JOINT DISEASE, SPINE   . ERECTILE DYSFUNCTION   . Essential hypertension   . First degree AV block   . HEARING LOSS   . Heart block    s/p Pacemaker  . Hyperlipidemia   . Hypertrophic obstructive cardiomyopathy ([place])   . LBBB (left bundle branch block)   . OA (osteoarthritis)    both  hips  . Pericardial effusion    a. micorpeforation s/p lead revision 06/26/15  . Presence of permanent cardiac pacemaker 06/24/2015   St Jude Assurity  . Prostate cancer [facility]) 2010   "external radiation; 40 treatments"  . 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 Severe aortic stenosis    a. s/p TAVR 03/2015. Pre-op  LHC 02/2015: minor nonobstructive CAD.  Marland Kitchen Squamous cell skin cancer    left lower leg  . Stroke ([place]) 02/19/2014   Small infarct high posterior left frontal lobe on MRI   . TIA (transient ischemic  attack) 01/21/2014   5 min spell aphasia without assoc sx     Family History  Problem Relation Age of Onset  . Heart disease Mother   . CVA Mother   . Stroke Mother   . CVA Father   . Cancer Father   . Brain cancer Sister   . Prostate cancer Brother   . Heart disease Brother   . Lung cancer Brother   . Heart attack Brother   . Hypertension Neg Hx    Past Surgical History:  Procedure Laterality Date  . ANTERIOR CERVICAL DECOMP/DISCECTOMY FUSION  ~ 2000   Dr. Louanne Skye  . BACK SURGERY    . CARDIAC CATHETERIZATION N/A 02/20/2015   Procedure: Right/Left Heart Cath and Coronary Angiography;  Surgeon: Sherren Mocha, MD;  Location: [place] [facility];  Service: Cardiovascular;  Laterality: N/A;  . CARDIAC VALVE REPLACEMENT    . CATARACT EXTRACTION W/ INTRAOCULAR LENS  IMPLANT, BILATERAL  2004; 2010   right; left  . EP IMPLANTABLE DEVICE N/A 06/24/2015   Procedure: Pacemaker Implant;  Surgeon: Evans Lance, MD;  Location: [place] [facility];  Service: Cardiovascular; St Jude Assurity   . EP IMPLANTABLE DEVICE N/A 06/26/2015   Procedure: Lead Revision/Repair;  Surgeon: Will Meredith Leeds, MD;  Location: [place] [facility];  Service: Cardiovascular;  Laterality: N/A;  . INSERT / REPLACE / REMOVE PACEMAKER  06/24/2015  . JOINT REPLACEMENT    . SQUAMOUS CELL CARCINOMA EXCISION Left X 3   "lower leg"  . TEE WITHOUT CARDIOVERSION N/A 03/07/2015   Procedure: TRANSESOPHAGEAL ECHOCARDIOGRAM (TEE);  Surgeon: Dorothy Spark, MD;  Location: [place];  Service: Cardiovascular;  Laterality: N/A;  . TEE WITHOUT CARDIOVERSION N/A 03/25/2015   Procedure: TRANSESOPHAGEAL ECHOCARDIOGRAM (TEE);  Surgeon: Burnell Blanks, MD;  Location: [place];  Service: Open Heart Surgery;  Laterality: N/A;  . TONSILLECTOMY    . TOTAL SHOULDER ARTHROPLASTY Right 2007   "wore it out playing tennis"  . TRANSCATHETER AORTIC VALVE REPLACEMENT, TRANSFEMORAL N/A 03/25/2015   Procedure: TRANSCATHETER AORTIC VALVE  REPLACEMENT, TRANSFEMORAL;  Surgeon: Burnell Blanks, MD;  Location: [place];  Service: Open Heart Surgery;  Laterality: N/A;   Social History   Socioeconomic History  . Marital status: Married    Spouse name: Not on file  . Number of children: Not on file  . Years of education: Not on file  . Highest education level: Not on file  Social Needs  . Financial resource strain: Not on file  . Food insecurity - worry: Not on file  . Food insecurity - inability: Not on file  . Transportation needs - medical: Not on file  . Transportation needs - non-medical: Not on file  Occupational History  . Occupation: Retired  Tobacco Use  . Smoking status: Never Smoker  .  Smokeless tobacco: Never Used  Substance and Sexual Activity  . Alcohol use: No    Alcohol/week: 0.0 oz  . Drug use: No  . Sexual activity: Not Currently  Other Topics Concern  . Not on file  Social History Narrative   Patient lives in [place] with his wife. 2nd marriage    4 children    He [facility] to the [facility] three days a week    Review of Systems: General: negative for chills, fever, night sweats or weight changes.  Cardiovascular: negative for chest pain, dyspnea on exertion, edema, orthopnea, palpitations, paroxysmal nocturnal dyspnea or shortness of breath Dermatological: negative for rash Respiratory: negative for cough or wheezing Urologic: negative for hematuria Abdominal: negative for nausea, vomiting, diarrhea, bright red blood per rectum, melena, or hematemesis Neurologic: negative for visual changes, syncope, or dizziness All other systems reviewed and are otherwise negative except as noted above.  Physical Exam:  Blood pressure 120/74, pulse 65, height 5' 8.75" (1.746 m), weight 217 lb (98.4 kg), SpO2 95 %.  General appearance: alert, cooperative and no distress Neck: no carotid bruit and no JVD Lungs: clear to auscultation bilaterally Heart: regular rate and rhythm, S1, S2 normal, no murmur, click,  rub or gallop Extremities: extremities normal, atraumatic, no cyanosis or edema Pulses: 2+ and symmetric Skin: Skin color, texture, turgor normal. No rashes or lesions Neurologic: Grossly normal  EKG AV sequential pacing -- personally reviewed     ASSESSMENT AND PLAN:   1. Aortic Valve Disease: s/p TAVR in 2016. His most recent 2D echo 03/2016 showed normal transaortic gradients with mean of 11 mmHg. He is stable w/o CP, dyspnea, syncope/near syncope. No exertional symptoms.  He would like to start physical therapy at [facility] where he resides, we will prescribe it.  2. HOCM: on BB therapy with metoprolol. He has a PPM. He denies CP, dyspnea, palpitations, syncope/ near syncope.   3. PPM: implanted for 2:1 symptomatic heart block. Followed by Dr. Lovena Le. St Jude device. Today's ECG showed AV sequential pacing, last interrogation in December and functioning well.   4. PAF: RRR on physical exam. Rate is controlled with metoprolol. He is on chronic anticoagulation with Coumadin. INRs are followed in our coumadin clinic. HR currently 60s. No bleeding.  5. HTN: controlled on current regimen.   6. HLD: continue conservative medical management with fish oil, given only minimal CAD noted on [facility] in 2016 as well as his advanced age. He has h/o statin intolerance with pravastatin in the past.   Follow-Up w/ Dr. Meda Coffee in 6 months.   Ena Dawley, MD [facility] 10/02/2017 6:51 PM

## 2017-09-27 NOTE — Patient Instructions (Signed)
Medication Instructions:   Your physician recommends that you continue on your current medications as directed. Please refer to the Current Medication list given to you today.     DR NELSON HAS WRITTEN A HARD PRESCRIPTION FOR YOU TO DO PHYSICAL THERAPY AT [facility]--PLEASE TAKE THIS TO THE NURSE AT [facility] TO SUBMIT THIS ORDER AND ARRANGE FOR YOU TO GET PHYSICAL THERAPY     Follow-Up:  Your physician wants you to follow-up in: 6 MONTHS WITH DR Johann Capers will receive a reminder letter in the mail two months in advance. If you don't receive a letter, please call our office to schedule the follow-up appointment.        If you need a refill on your cardiac medications before your next appointment, please call your pharmacy.

## 2017-09-30 ENCOUNTER — Ambulatory Visit: Payer: Medicare Other | Admitting: Cardiology

## 2017-10-03 NOTE — Addendum Note (Signed)
Addended by: Nuala Alpha on: 10/03/2017 09:55 AM   Modules accepted: Orders

## 2017-10-05 ENCOUNTER — Ambulatory Visit (INDEPENDENT_AMBULATORY_CARE_PROVIDER_SITE_OTHER): Payer: Medicare Other | Admitting: Pharmacist Clinician (PhC)/ Clinical Pharmacy Specialist

## 2017-10-05 DIAGNOSIS — I4891 Unspecified atrial fibrillation: Secondary | ICD-10-CM | POA: Diagnosis not present

## 2017-10-05 DIAGNOSIS — Z5181 Encounter for therapeutic drug level monitoring: Secondary | ICD-10-CM

## 2017-10-05 DIAGNOSIS — R2689 Other abnormalities of gait and mobility: Secondary | ICD-10-CM | POA: Diagnosis not present

## 2017-10-05 DIAGNOSIS — Z952 Presence of prosthetic heart valve: Secondary | ICD-10-CM

## 2017-10-05 DIAGNOSIS — M6281 Muscle weakness (generalized): Secondary | ICD-10-CM | POA: Diagnosis not present

## 2017-10-05 DIAGNOSIS — R2681 Unsteadiness on feet: Secondary | ICD-10-CM | POA: Diagnosis not present

## 2017-10-05 LAB — POCT INR: INR: 2.2

## 2017-10-10 DIAGNOSIS — R2689 Other abnormalities of gait and mobility: Secondary | ICD-10-CM | POA: Diagnosis not present

## 2017-10-10 DIAGNOSIS — M6281 Muscle weakness (generalized): Secondary | ICD-10-CM | POA: Diagnosis not present

## 2017-10-10 DIAGNOSIS — R2681 Unsteadiness on feet: Secondary | ICD-10-CM | POA: Diagnosis not present

## 2017-10-10 IMAGING — CR DG CHEST 2V
2 series · 2 of 2 positions shown · non-contrast
Comparison: Chest x-ray dated 03/26/2015.

CLINICAL DATA: Increased dizziness for 2 weeks with 1 fall,
preoperative pacemaker. Diagnosed with bradycardia today.

EXAM:
CHEST  2 VIEW

[chest ap]
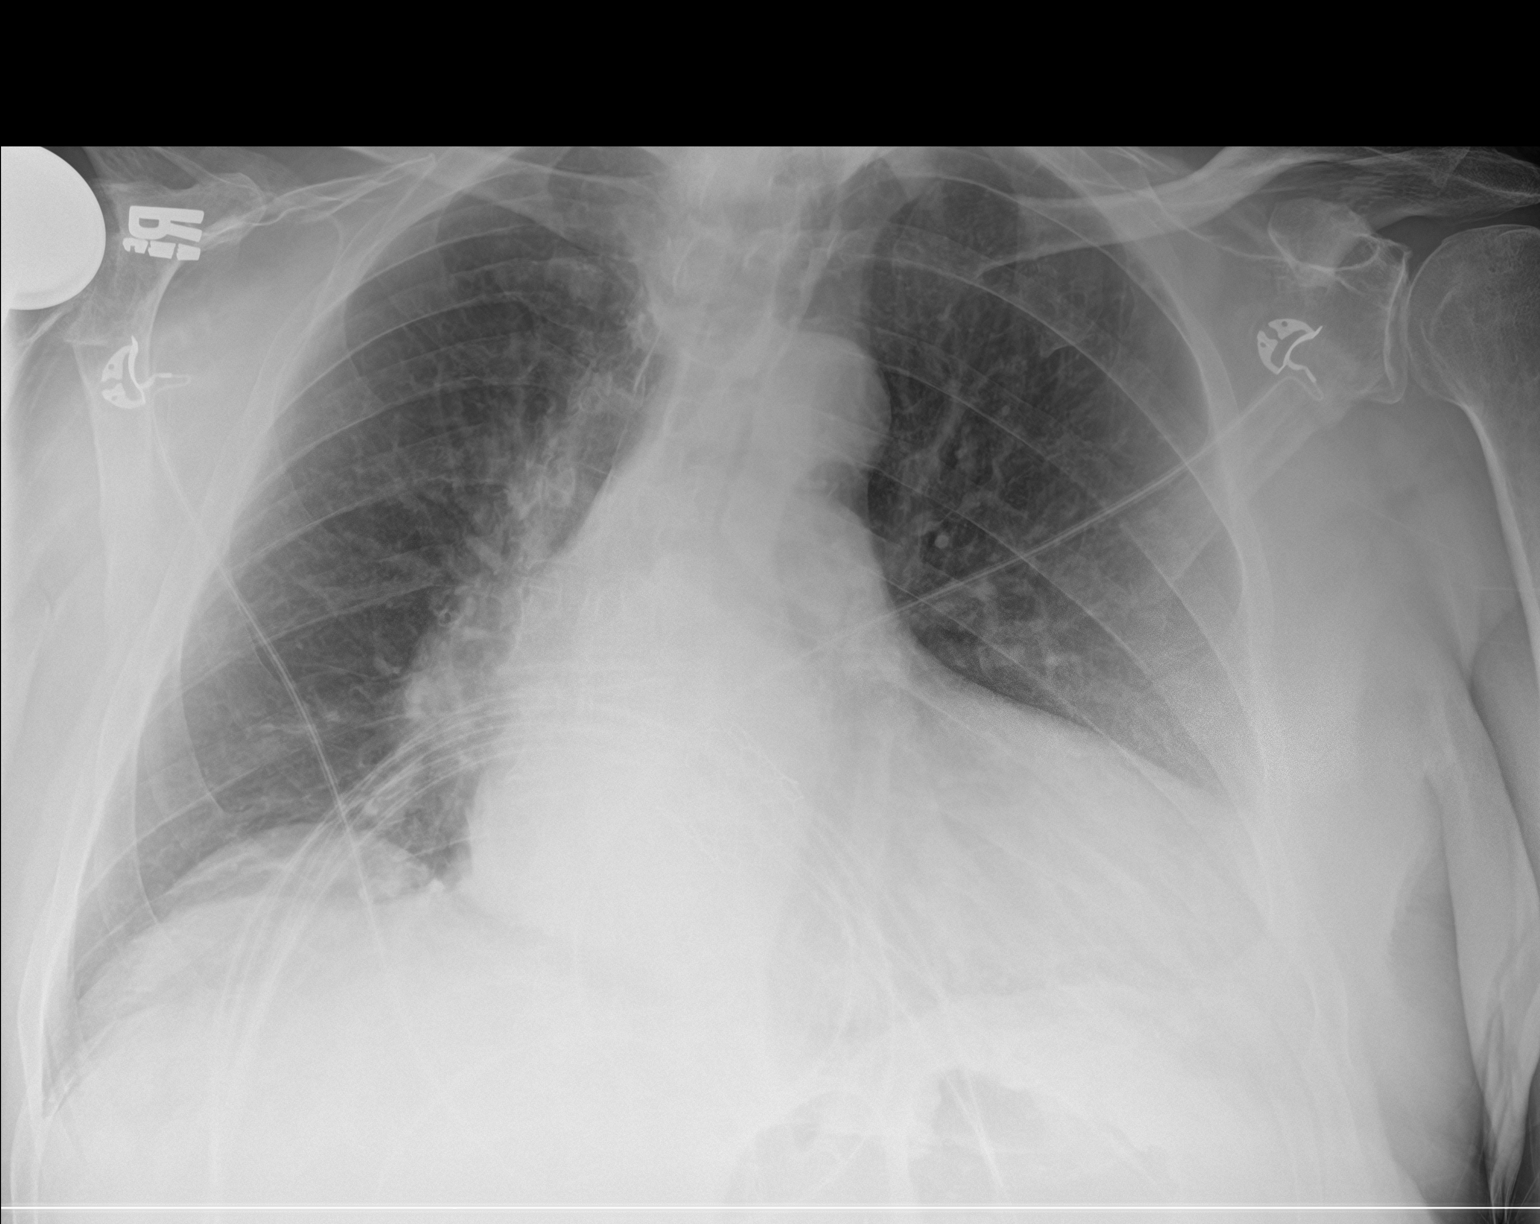

[chest lat]
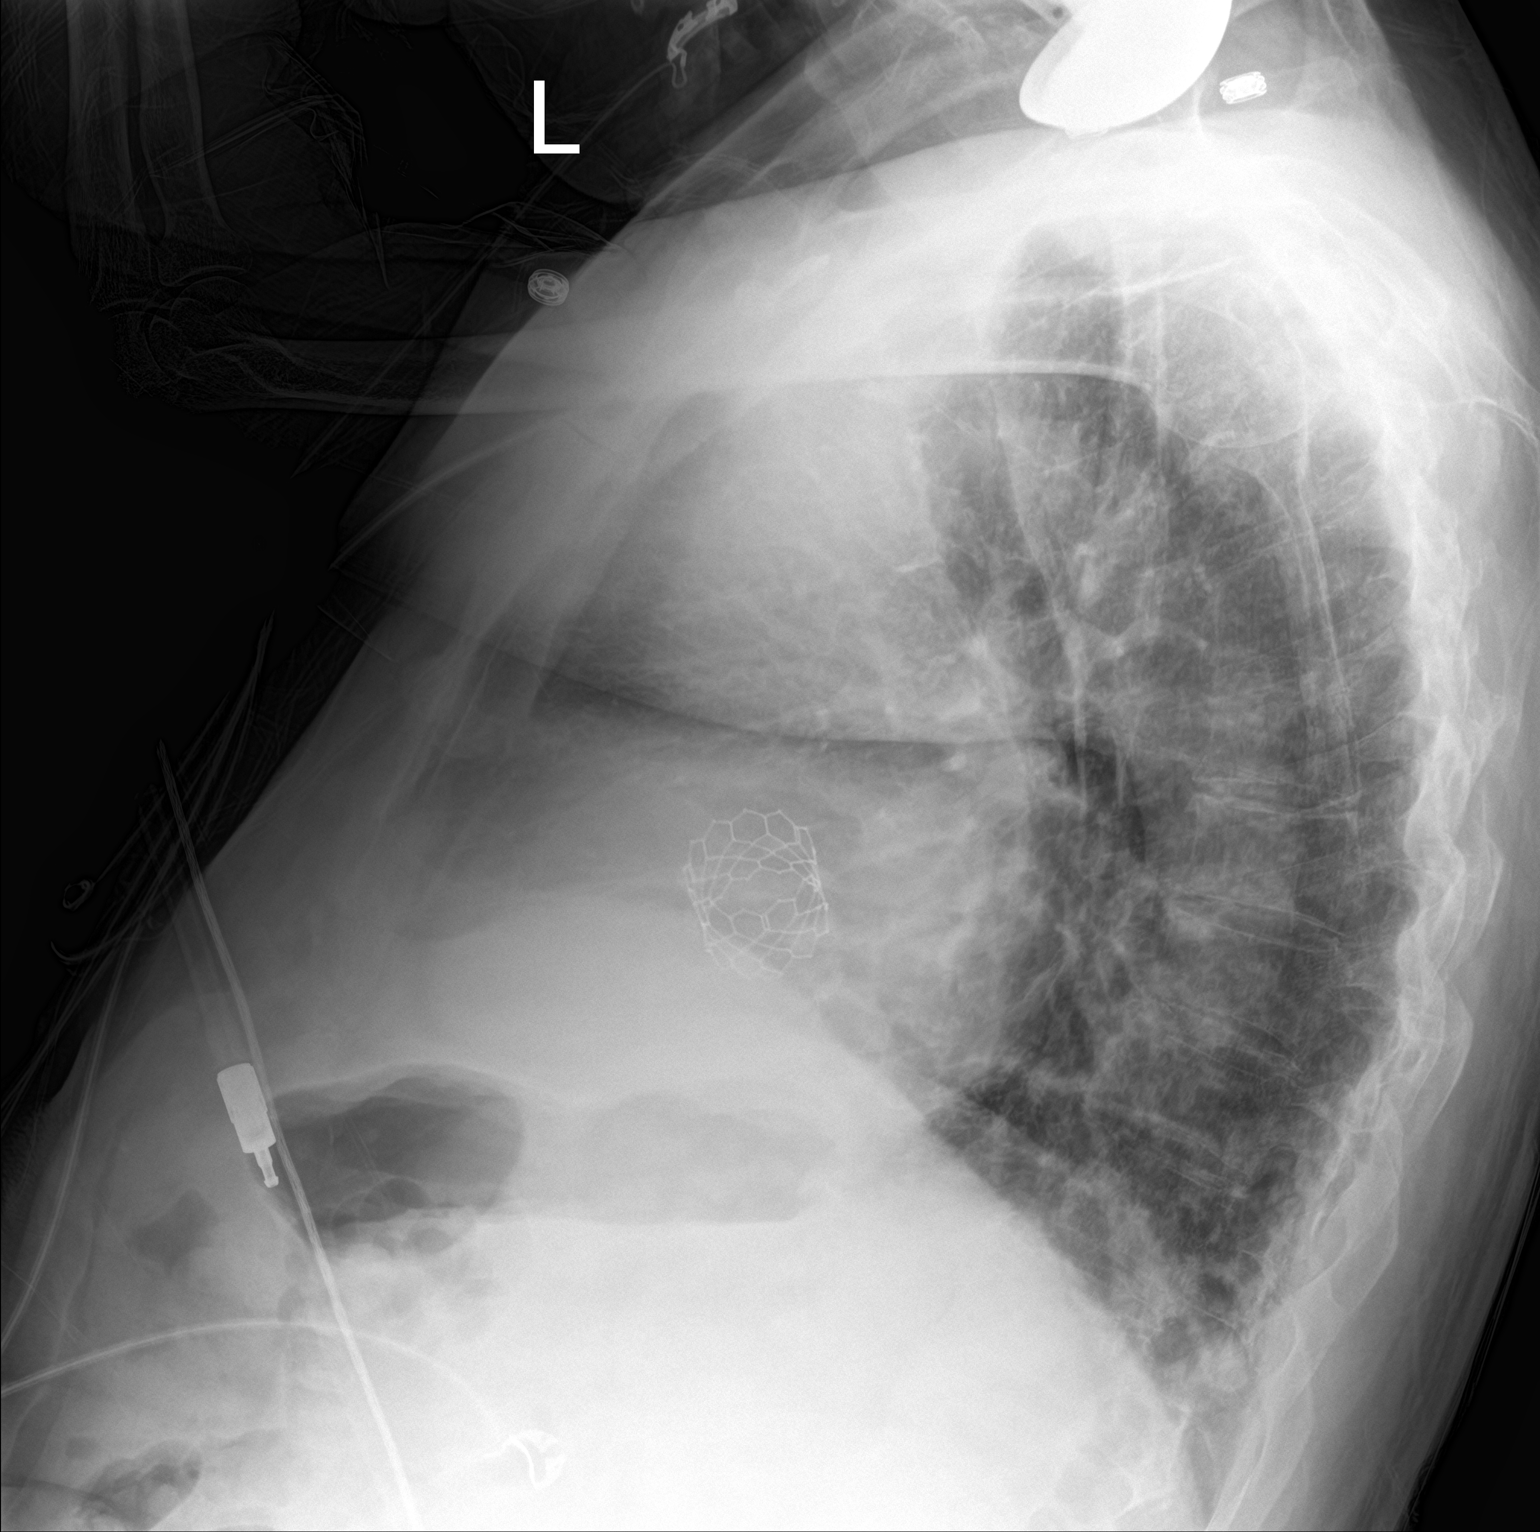

[2 of 2 positions shown; findings below may reference images not displayed]

FINDINGS: Cardiomegaly is unchanged. Aortic valve hardware appears stable in
position. Central pulmonary vascular congestion is unchanged,
probably chronic. More peripheral lungs are clear. No pleural
effusion seen. No pneumothorax seen. Mild degenerative change again
noted within the thoracic spine. No acute osseous abnormality.
IMPRESSION: Fairly marked cardiomegaly, stable.

Central pulmonary vascular congestion is unchanged, probably a
chronic mild congestive heart failure. No frank pulmonary edema
pattern. Lungs otherwise clear.

## 2017-10-12 IMAGING — CR DG CHEST 2V
2 series · 2 of 2 positions shown · non-contrast
Comparison: Chest x-ray of June 23, 2015

CLINICAL DATA: Status post permanent pacemaker placement

EXAM:
CHEST  2 VIEW

[chest lat]
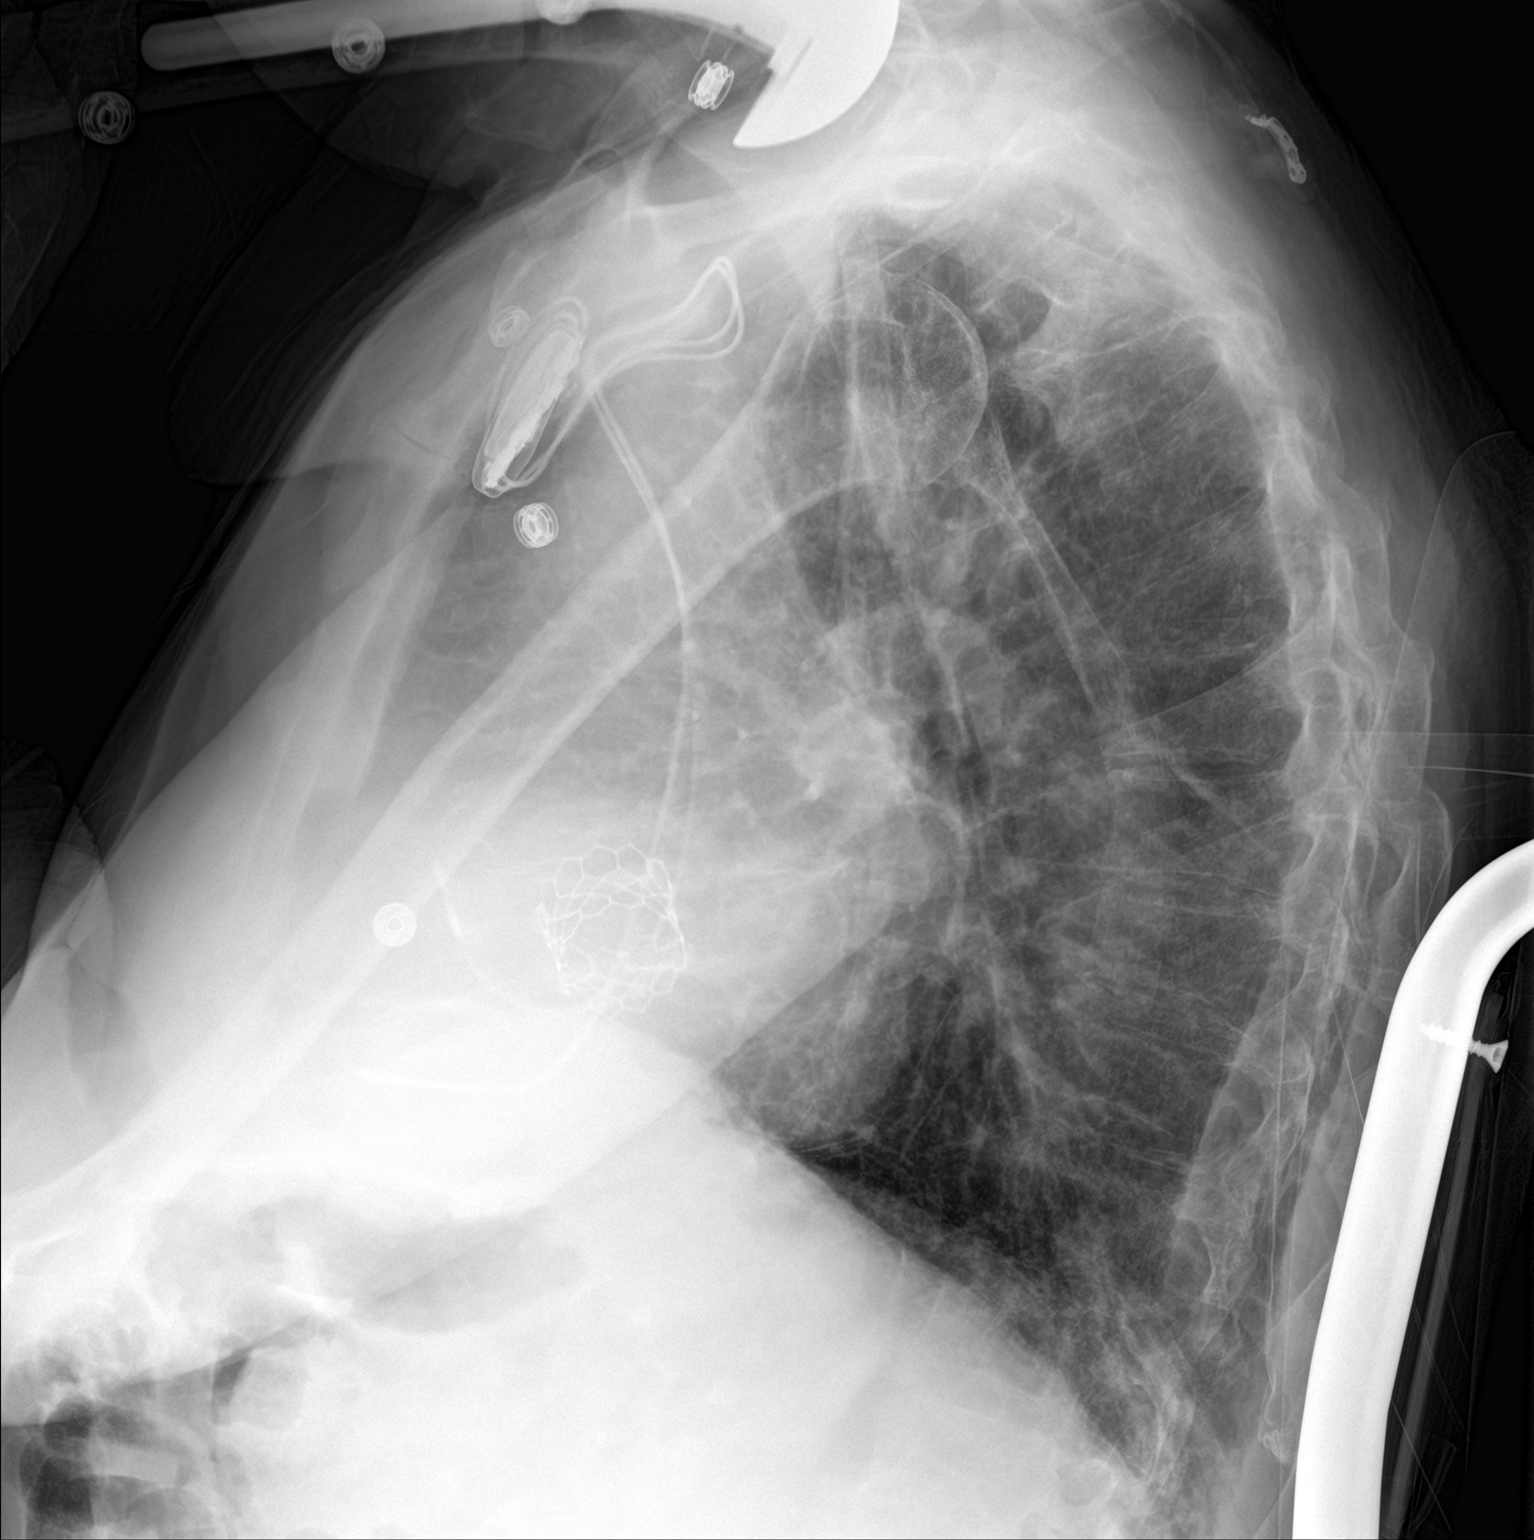

[chest ap]
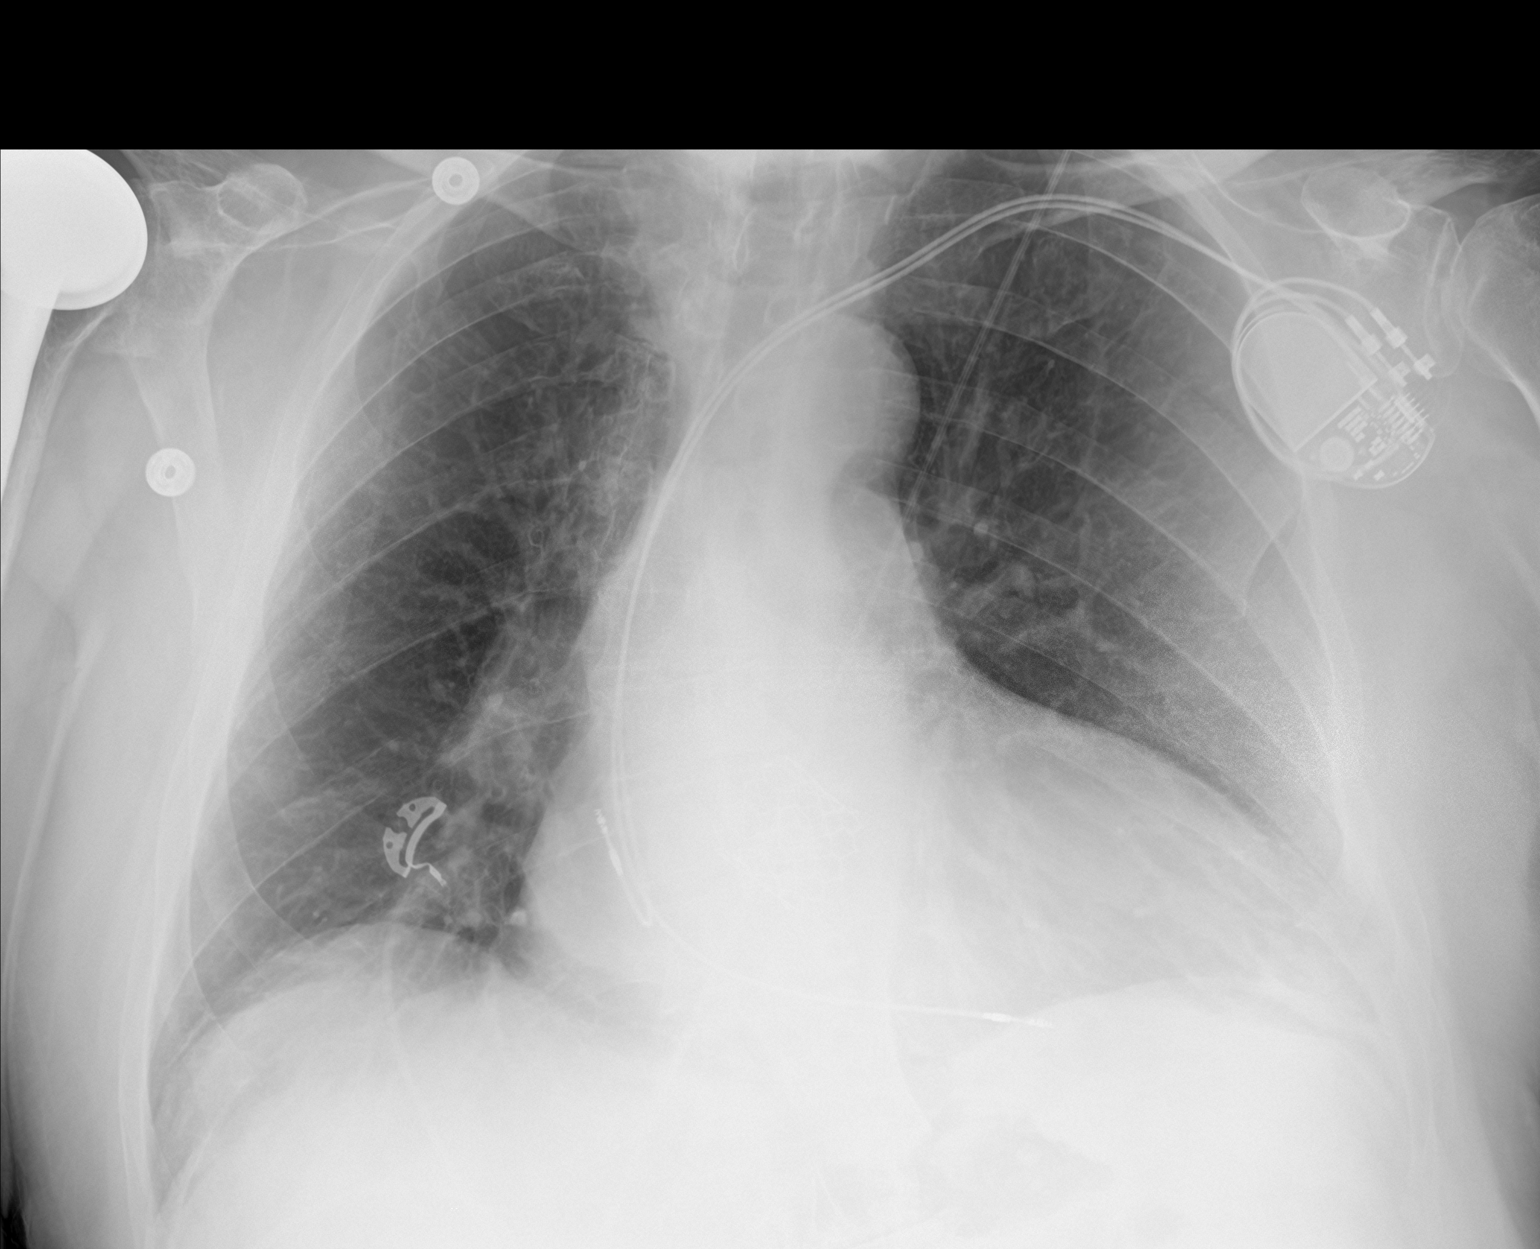

[2 of 2 positions shown; findings below may reference images not displayed]

FINDINGS: The patient has undergone permanent pacemaker placement.
Radiographic positioning of the electrodes and of the generator is
good. The lungs are adequately inflated. There is no pneumothorax or
pleural effusion. The cardiac silhouette remains enlarged. The
prosthetic aortic valve cage is visible. The pulmonary vascularity
is mildly prominent centrally but stable. The bony thorax exhibits
no acute abnormality.
IMPRESSION: There is no postprocedure complication following permanent pacemaker
placement. There is stable enlargement of the cardiac silhouette
without significant pulmonary vascular congestion.

## 2017-10-14 DIAGNOSIS — R2689 Other abnormalities of gait and mobility: Secondary | ICD-10-CM | POA: Diagnosis not present

## 2017-10-14 DIAGNOSIS — R2681 Unsteadiness on feet: Secondary | ICD-10-CM | POA: Diagnosis not present

## 2017-10-14 DIAGNOSIS — M6281 Muscle weakness (generalized): Secondary | ICD-10-CM | POA: Diagnosis not present

## 2017-10-14 IMAGING — DX DG CHEST 2V
2 series · 2 of 2 positions shown · non-contrast
Comparison: 06/25/2015

CLINICAL DATA: Short of breath.  Pacemaker placement

EXAM:
CHEST  2 VIEW

[chest lat]
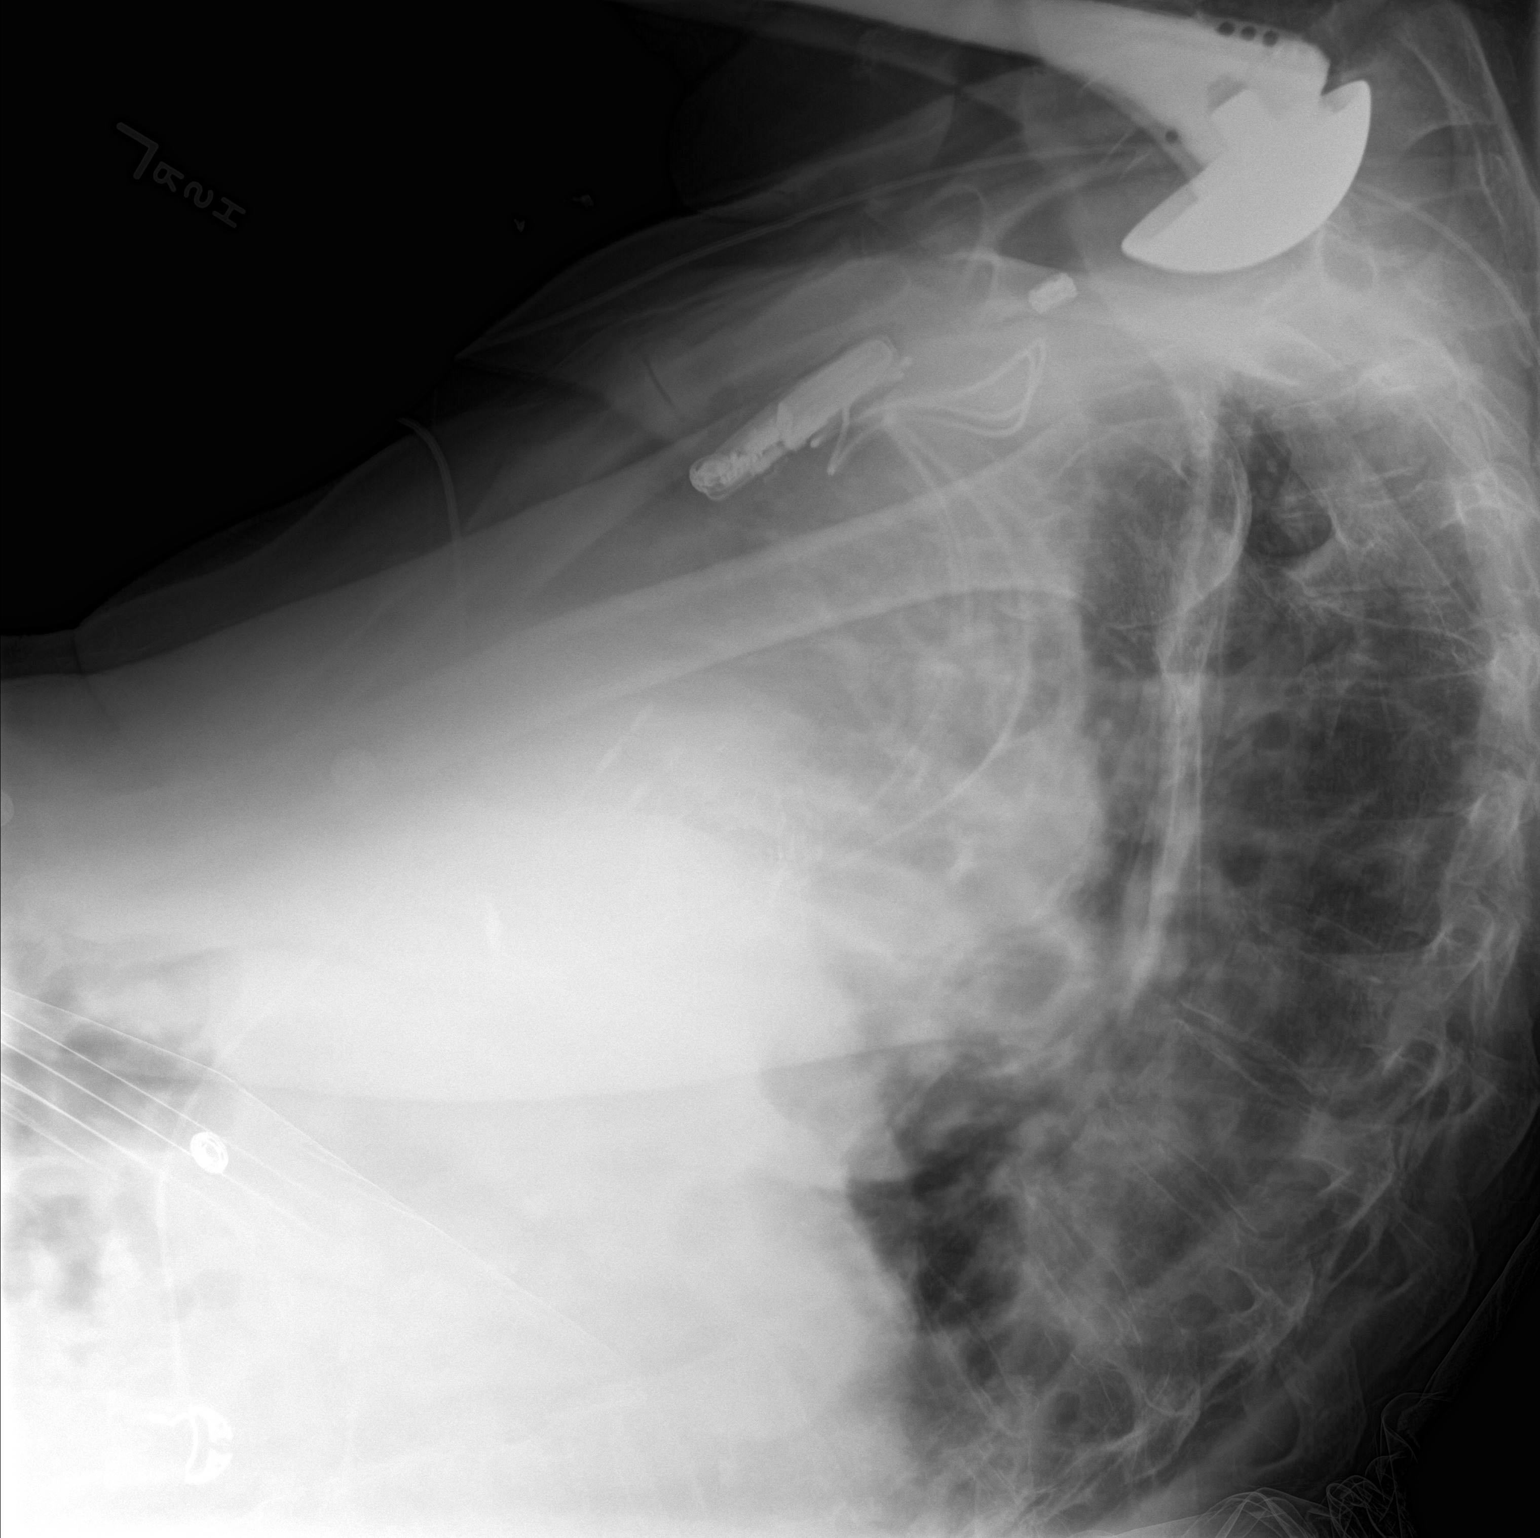

[chest ap]
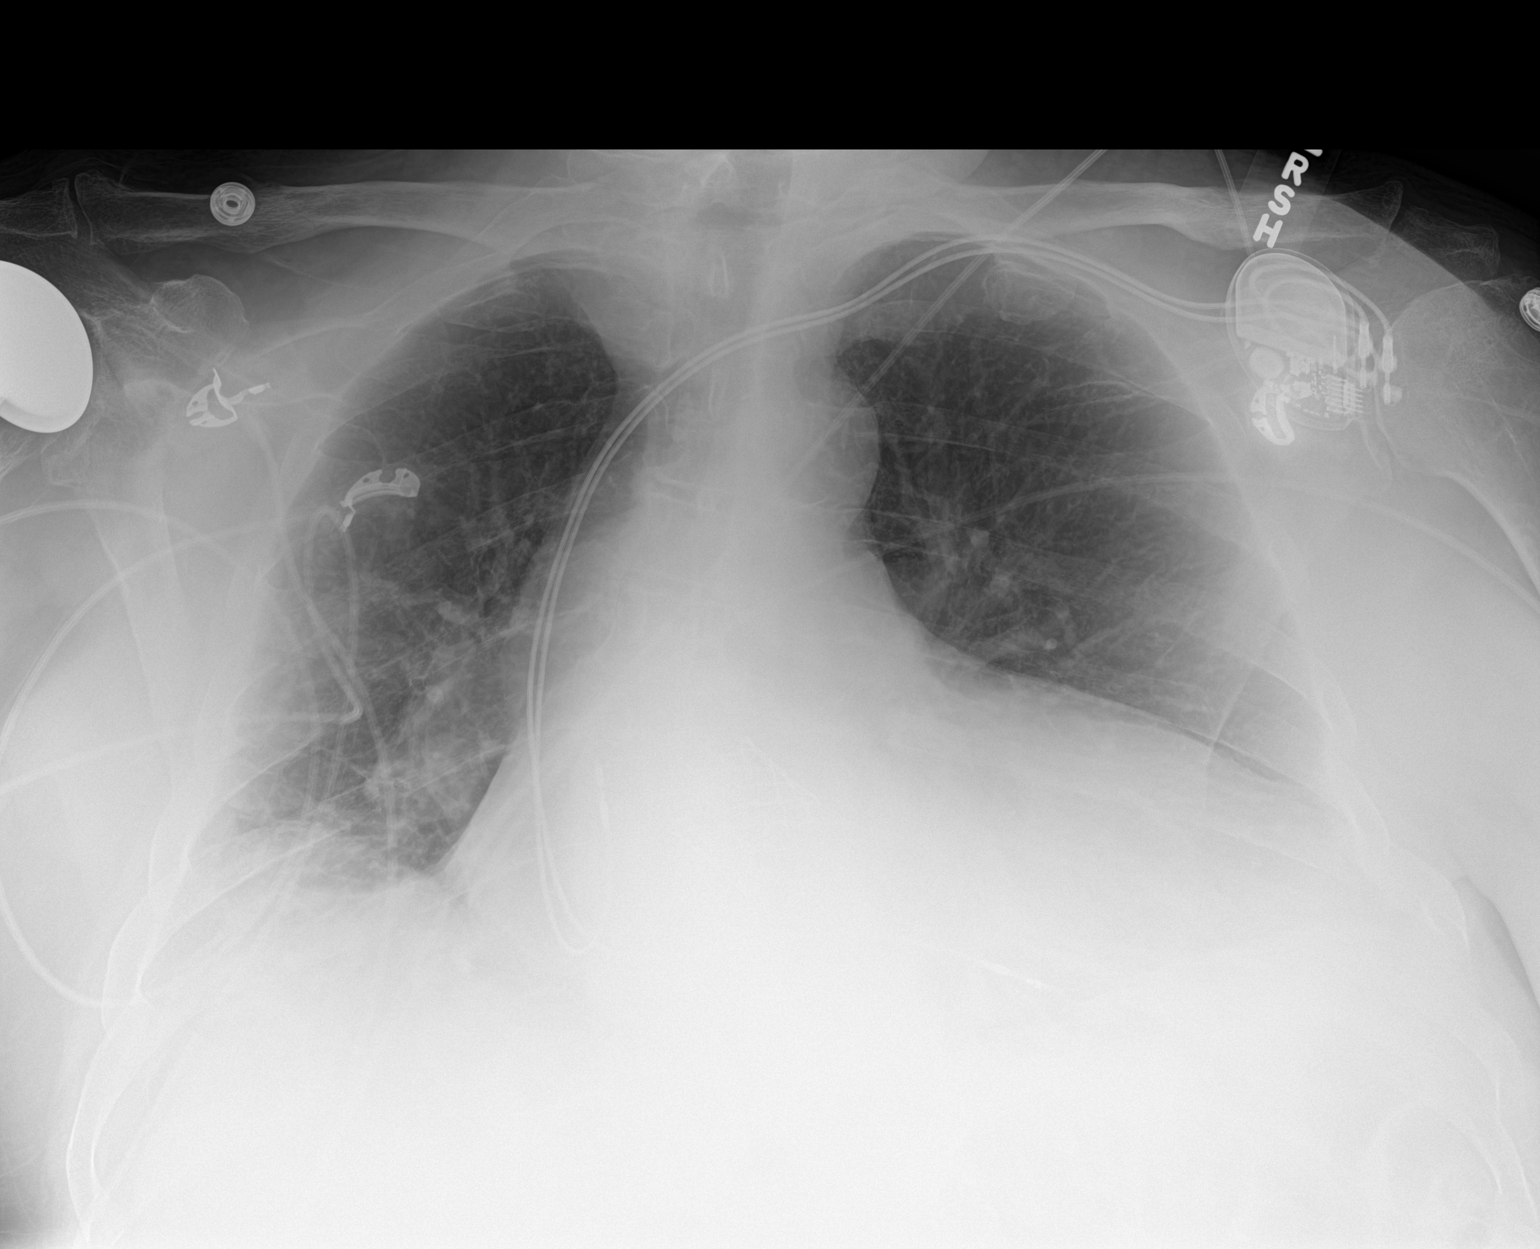

[2 of 2 positions shown; findings below may reference images not displayed]

FINDINGS: Left subclavian dual lead pacemaker in satisfactory position and
alignment. T AVR noted

Cardiac enlargement without heart failure. Bibasilar atelectasis
unchanged. Negative for pulmonary edema.
IMPRESSION: Bibasilar atelectasis.  No change from the prior study.

## 2017-10-17 LAB — CUP PACEART REMOTE DEVICE CHECK
Battery Voltage: 2.98 V
Brady Statistic AP VP Percent: 74 %
Brady Statistic AP VS Percent: 1 %
Brady Statistic AS VP Percent: 26 %
Brady Statistic RA Percent Paced: 74 %
Brady Statistic RV Percent Paced: 99 %
Implantable Lead Implant Date: 20161108
Implantable Lead Location: 753860
Implantable Pulse Generator Implant Date: 20161108
Lead Channel Impedance Value: 430 Ohm
Lead Channel Pacing Threshold Amplitude: 0.75 V
Lead Channel Pacing Threshold Pulse Width: 0.5 ms
Lead Channel Sensing Intrinsic Amplitude: 7.2 mV
Lead Channel Setting Pacing Amplitude: 0.875
Lead Channel Setting Pacing Amplitude: 2 V
Lead Channel Setting Sensing Sensitivity: 5 mV
MDC IDC LEAD IMPLANT DT: 20161108
MDC IDC LEAD LOCATION: 753859
MDC IDC MSMT BATTERY REMAINING LONGEVITY: 115 mo
MDC IDC MSMT BATTERY REMAINING PERCENTAGE: 95.5 %
MDC IDC MSMT LEADCHNL RA IMPEDANCE VALUE: 410 Ohm
MDC IDC MSMT LEADCHNL RA SENSING INTR AMPL: 5 mV
MDC IDC MSMT LEADCHNL RV PACING THRESHOLD AMPLITUDE: 0.625 V
MDC IDC MSMT LEADCHNL RV PACING THRESHOLD PULSEWIDTH: 0.5 ms
MDC IDC PG SERIAL: 7825693
MDC IDC SESS DTM: 20190205070013
MDC IDC SET LEADCHNL RV PACING PULSEWIDTH: 0.5 ms
MDC IDC STAT BRADY AS VS PERCENT: 1 %

## 2017-10-18 DIAGNOSIS — R2689 Other abnormalities of gait and mobility: Secondary | ICD-10-CM | POA: Diagnosis not present

## 2017-10-18 DIAGNOSIS — M6281 Muscle weakness (generalized): Secondary | ICD-10-CM | POA: Diagnosis not present

## 2017-10-18 DIAGNOSIS — R2681 Unsteadiness on feet: Secondary | ICD-10-CM | POA: Diagnosis not present

## 2017-10-21 DIAGNOSIS — R2689 Other abnormalities of gait and mobility: Secondary | ICD-10-CM | POA: Diagnosis not present

## 2017-10-21 DIAGNOSIS — M6281 Muscle weakness (generalized): Secondary | ICD-10-CM | POA: Diagnosis not present

## 2017-10-21 DIAGNOSIS — R2681 Unsteadiness on feet: Secondary | ICD-10-CM | POA: Diagnosis not present

## 2017-11-16 ENCOUNTER — Ambulatory Visit (INDEPENDENT_AMBULATORY_CARE_PROVIDER_SITE_OTHER): Payer: Medicare Other | Admitting: Pharmacist Clinician (PhC)/ Clinical Pharmacy Specialist

## 2017-11-16 DIAGNOSIS — I4891 Unspecified atrial fibrillation: Secondary | ICD-10-CM

## 2017-11-16 DIAGNOSIS — Z952 Presence of prosthetic heart valve: Secondary | ICD-10-CM | POA: Diagnosis not present

## 2017-11-16 DIAGNOSIS — Z5181 Encounter for therapeutic drug level monitoring: Secondary | ICD-10-CM | POA: Diagnosis not present

## 2017-11-16 LAB — POCT INR: INR: 2.3

## 2017-11-16 NOTE — Patient Instructions (Signed)
Description   Continue taking the same dosage 1 tablet (3mg) daily.  Recheck INR in 6 weeks. Coumadin Clinic [phone]     

## 2017-11-29 ENCOUNTER — Other Ambulatory Visit: Payer: Self-pay | Admitting: Cardiology

## 2017-12-15 DIAGNOSIS — L821 Other seborrheic keratosis: Secondary | ICD-10-CM | POA: Diagnosis not present

## 2017-12-15 DIAGNOSIS — L438 Other lichen planus: Secondary | ICD-10-CM | POA: Diagnosis not present

## 2017-12-20 ENCOUNTER — Ambulatory Visit (INDEPENDENT_AMBULATORY_CARE_PROVIDER_SITE_OTHER): Payer: Medicare Other | Admitting: *Deleted

## 2017-12-20 DIAGNOSIS — I442 Atrioventricular block, complete: Secondary | ICD-10-CM | POA: Diagnosis not present

## 2017-12-20 NOTE — Progress Notes (Signed)
Remote pacemaker transmission.   

## 2017-12-21 ENCOUNTER — Encounter: Payer: Self-pay | Admitting: Cardiology

## 2017-12-28 ENCOUNTER — Ambulatory Visit (INDEPENDENT_AMBULATORY_CARE_PROVIDER_SITE_OTHER): Payer: Medicare Other | Admitting: Pharmacist Clinician (PhC)/ Clinical Pharmacy Specialist

## 2017-12-28 DIAGNOSIS — Z5181 Encounter for therapeutic drug level monitoring: Secondary | ICD-10-CM | POA: Diagnosis not present

## 2017-12-28 DIAGNOSIS — I4891 Unspecified atrial fibrillation: Secondary | ICD-10-CM | POA: Diagnosis not present

## 2017-12-28 DIAGNOSIS — Z952 Presence of prosthetic heart valve: Secondary | ICD-10-CM

## 2017-12-28 LAB — POCT INR: INR: 2.2

## 2017-12-28 NOTE — Patient Instructions (Signed)
Description   Continue taking the same dosage 1 tablet (3mg) daily.  Recheck INR in 6 weeks. Coumadin Clinic [phone]     

## 2018-01-06 LAB — CUP PACEART REMOTE DEVICE CHECK
Battery Remaining Longevity: 115 mo
Battery Voltage: 2.98 V
Brady Statistic AP VP Percent: 75 %
Brady Statistic AP VS Percent: 1 %
Brady Statistic RA Percent Paced: 75 %
Date Time Interrogation Session: 20190507060013
Implantable Lead Implant Date: 20161108
Implantable Lead Location: 753860
Implantable Pulse Generator Implant Date: 20161108
Lead Channel Impedance Value: 450 Ohm
Lead Channel Pacing Threshold Amplitude: 0.75 V
Lead Channel Pacing Threshold Amplitude: 0.75 V
Lead Channel Pacing Threshold Pulse Width: 0.5 ms
Lead Channel Setting Sensing Sensitivity: 5 mV
MDC IDC LEAD IMPLANT DT: 20161108
MDC IDC LEAD LOCATION: 753859
MDC IDC MSMT BATTERY REMAINING PERCENTAGE: 95.5 %
MDC IDC MSMT LEADCHNL RA IMPEDANCE VALUE: 430 Ohm
MDC IDC MSMT LEADCHNL RA SENSING INTR AMPL: 5 mV
MDC IDC MSMT LEADCHNL RV PACING THRESHOLD PULSEWIDTH: 0.5 ms
MDC IDC MSMT LEADCHNL RV SENSING INTR AMPL: 12 mV
MDC IDC PG SERIAL: 7825693
MDC IDC SET LEADCHNL RA PACING AMPLITUDE: 2 V
MDC IDC SET LEADCHNL RV PACING AMPLITUDE: 1 V
MDC IDC SET LEADCHNL RV PACING PULSEWIDTH: 0.5 ms
MDC IDC STAT BRADY AS VP PERCENT: 25 %
MDC IDC STAT BRADY AS VS PERCENT: 1 %
MDC IDC STAT BRADY RV PERCENT PACED: 99 %
Pulse Gen Model: 2240

## 2018-01-17 DIAGNOSIS — H353132 Nonexudative age-related macular degeneration, bilateral, intermediate dry stage: Secondary | ICD-10-CM | POA: Diagnosis not present

## 2018-01-17 DIAGNOSIS — Z961 Presence of intraocular lens: Secondary | ICD-10-CM | POA: Diagnosis not present

## 2018-01-17 DIAGNOSIS — H52203 Unspecified astigmatism, bilateral: Secondary | ICD-10-CM | POA: Diagnosis not present

## 2018-01-25 DIAGNOSIS — H353221 Exudative age-related macular degeneration, left eye, with active choroidal neovascularization: Secondary | ICD-10-CM | POA: Diagnosis not present

## 2018-01-25 DIAGNOSIS — H43813 Vitreous degeneration, bilateral: Secondary | ICD-10-CM | POA: Diagnosis not present

## 2018-01-25 DIAGNOSIS — H353111 Nonexudative age-related macular degeneration, right eye, early dry stage: Secondary | ICD-10-CM | POA: Diagnosis not present

## 2018-02-08 ENCOUNTER — Ambulatory Visit (INDEPENDENT_AMBULATORY_CARE_PROVIDER_SITE_OTHER): Payer: Medicare Other | Admitting: Pharmacist Clinician (PhC)/ Clinical Pharmacy Specialist

## 2018-02-08 DIAGNOSIS — I4891 Unspecified atrial fibrillation: Secondary | ICD-10-CM

## 2018-02-08 DIAGNOSIS — Z952 Presence of prosthetic heart valve: Secondary | ICD-10-CM | POA: Diagnosis not present

## 2018-02-08 DIAGNOSIS — Z5181 Encounter for therapeutic drug level monitoring: Secondary | ICD-10-CM

## 2018-02-08 LAB — POCT INR: INR: 2.4 (ref 2.0–3.0)

## 2018-03-01 DIAGNOSIS — H353221 Exudative age-related macular degeneration, left eye, with active choroidal neovascularization: Secondary | ICD-10-CM | POA: Diagnosis not present

## 2018-03-11 ENCOUNTER — Other Ambulatory Visit: Payer: Self-pay | Admitting: Cardiology

## 2018-03-21 ENCOUNTER — Ambulatory Visit (INDEPENDENT_AMBULATORY_CARE_PROVIDER_SITE_OTHER): Payer: Medicare Other | Admitting: *Deleted

## 2018-03-21 DIAGNOSIS — I442 Atrioventricular block, complete: Secondary | ICD-10-CM | POA: Diagnosis not present

## 2018-03-21 DIAGNOSIS — R001 Bradycardia, unspecified: Secondary | ICD-10-CM

## 2018-03-22 ENCOUNTER — Ambulatory Visit (INDEPENDENT_AMBULATORY_CARE_PROVIDER_SITE_OTHER): Payer: Medicare Other | Admitting: Pharmacist Clinician (PhC)/ Clinical Pharmacy Specialist

## 2018-03-22 DIAGNOSIS — Z5181 Encounter for therapeutic drug level monitoring: Secondary | ICD-10-CM

## 2018-03-22 DIAGNOSIS — I4891 Unspecified atrial fibrillation: Secondary | ICD-10-CM | POA: Diagnosis not present

## 2018-03-22 DIAGNOSIS — Z952 Presence of prosthetic heart valve: Secondary | ICD-10-CM

## 2018-03-22 LAB — POCT INR: INR: 2.5 (ref 2.0–3.0)

## 2018-03-22 NOTE — Progress Notes (Signed)
Remote pacemaker transmission.   

## 2018-04-12 DIAGNOSIS — H353221 Exudative age-related macular degeneration, left eye, with active choroidal neovascularization: Secondary | ICD-10-CM | POA: Diagnosis not present

## 2018-04-14 ENCOUNTER — Encounter: Payer: Self-pay | Admitting: Cardiology

## 2018-04-14 ENCOUNTER — Ambulatory Visit: Payer: Medicare Other | Admitting: Cardiology

## 2018-04-14 VITALS — BP 122/62 | HR 64 | Ht 68.75 in | Wt 212.8 lb

## 2018-04-14 DIAGNOSIS — I421 Obstructive hypertrophic cardiomyopathy: Secondary | ICD-10-CM

## 2018-04-14 DIAGNOSIS — Z952 Presence of prosthetic heart valve: Secondary | ICD-10-CM | POA: Diagnosis not present

## 2018-04-14 NOTE — Patient Instructions (Signed)
Medication Instructions:   Your physician recommends that you continue on your current medications as directed. Please refer to the Current Medication list given to you today.    Labwork:  ON Monday October 09, 2018 AT OUR OFFICE TO CHECK---CMET, CBC W DIFF, AND TSH--YOU DO NOT HAVE TO FAST FOR THIS APPOINTMENT      Follow-Up:  Your physician wants you to follow-up in: [place] will receive a reminder letter in the mail two months in advance. If you don't receive a letter, please call our office to schedule the follow-up appointment.        If you need a refill on your cardiac medications before your next appointment, please call your pharmacy.

## 2018-04-14 NOTE — Progress Notes (Signed)
04/14/2018 TY [place]TR[place]   Mar 10, 1924  [id number]  Primary Physician Dorothyann Peng, NP Primary Cardiologist: Dr. Meda Coffee Electrophysiologist: Dr. Lovena Le  Reason for Visit/CC: 3 month f/u for HOCM, Aortic Valve Disease and PAF  HPI:  Robert Anderson is a  84 y.o. male who presents today for 3 month follow up visit.  He has a history of severe AS s/p TAVR 03/25/15, HOCM, LBBB, 1st degree AVB, essential HTN, h/o TIA & CVA, PAF now on chronic coumadin, followed in coumadin clinic. He also has a PPM, implanted for symptomatic 2:1 heart block following his TAVR. This is followed by Dr. Lovena Le.  LHC 02/2015: minor nonobstructive CAD. 2D echo 04/25/15: vigorous LV systolic function with near-cavity obliteration, moderate LVH, EF 65-70%, grade 1 DD, mod LAE. His primary cardiologist is Dr. Meda Coffee. Last OV with her was 11/15/16. He was stable at that time and denied any symptoms. He was instructed to f/u in 3-4 months. Of note, he has also had some issues with mild orthostatic hypotension. He has been able to tolerate low doses of metoprolol, which he needs for his HOCM.   09/27/2017 - 1 yaer follow up, Robert Anderson feels and looks great, he exercises several times a week at his retirement home, Has no palpitations, no LE edema, no dizziness or syncope. No chest pain or DOE. He is being followed by Dr Lovena Le for PM chcekups, the last one in November 2018 and functioning well.  04/14/2018 -patient is coming after 6 months, he states that he feels great, he looks great, he continues to exercise twice a week for an hour at the [facility], he is completely asymptomatic, denies any chest pain shortness of breath no syncope, no palpitations, no falls.  He had one episode of dizziness a week ago, it has resolved on its own, he check his blood pressure at the time it was 140.  He denies any bleeding with warfarin.  Current Meds  Medication Sig  . acetaminophen (TYLENOL) 325 MG tablet Take 1-2 tablets (325-650 mg total) by mouth every 4 (four)  hours as needed for mild pain.  Marland Kitchen docusate sodium (COLACE) 100 MG capsule Take 300 mg by mouth at bedtime.   . metoprolol tartrate (LOPRESSOR) 25 MG tablet TAKE 1/2 TABLET BY MOUTH TWICE A DAY  . Multiple Vitamins-Minerals (PRESERVISION AREDS 2+MULTI VIT PO) Take 1 tablet by mouth 2 (two) times daily.  . Omega-3 Fatty Acids (FISH OIL) 1000 MG CAPS Take 1,000 mg by mouth 2 (two) times daily.  . tamsulosin (FLOMAX) 0.4 MG CAPS capsule TAKE ONE CAPSULE BY MOUTH 3 TIMES A WEEK ON MONDAYS, WEDNESDAYS, AND FRIDAYS  . warfarin (COUMADIN) 3 MG tablet TAKE AS DIRECTED BY COUMADIN CLINIC   No Known Allergies Past Medical History:  Diagnosis Date  . Atrial fibrillation ([place])    a. dx after lead revision 06/26/15 >> anticoag not started due to recent pericardial effusion in setting of perforation and lead revision  . Bradycardia    a. HR 30s in clinic 06/2015 - BB discontinued.  . DEGENERATIVE JOINT DISEASE, SPINE   . ERECTILE DYSFUNCTION   . Essential hypertension   . First degree AV block   . HEARING LOSS   . Heart block    s/p Pacemaker  . Hyperlipidemia   . Hypertrophic obstructive cardiomyopathy ([place])   . LBBB (left bundle branch block)   . OA (osteoarthritis)    both hips  . Pericardial effusion    a. micorpeforation s/p lead revision  06/26/15  . Presence of permanent cardiac pacemaker 06/24/2015   St Jude Assurity  . Prostate cancer [facility]) 2010   "external radiation; 40 treatments"  . 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 Severe aortic stenosis    a. s/p TAVR 03/2015. Pre-op  LHC 02/2015: minor nonobstructive CAD.  Marland Kitchen Squamous cell skin cancer    left lower leg  . Stroke ([place]) 02/19/2014   Small infarct high posterior left frontal lobe on MRI   . TIA (transient ischemic attack) 01/21/2014   5 min spell aphasia without assoc sx     Family History  Problem Relation Age  of Onset  . Heart disease Mother   . CVA Mother   . Stroke Mother   . CVA Father   . Cancer Father   . Brain cancer Sister   . Prostate cancer Brother   . Heart disease Brother   . Lung cancer Brother   . Heart attack Brother   . Hypertension Neg Hx    Past Surgical History:  Procedure Laterality Date  . ANTERIOR CERVICAL DECOMP/DISCECTOMY FUSION  ~ 2000   Dr. Louanne Skye  . BACK SURGERY    . CARDIAC CATHETERIZATION N/A 02/20/2015   Procedure: Right/Left Heart Cath and Coronary Angiography;  Surgeon: Sherren Mocha, MD;  Location: [place] CV LAB;  Service: Cardiovascular;  Laterality: N/A;  . CARDIAC VALVE REPLACEMENT    . CATARACT EXTRACTION W/ INTRAOCULAR LENS  IMPLANT, BILATERAL  2004; 2010   right; left  . EP IMPLANTABLE DEVICE N/A 06/24/2015   Procedure: Pacemaker Implant;  Surgeon: Evans Lance, MD;  Location: [place] CV LAB;  Service: Cardiovascular; St Jude Assurity   . EP IMPLANTABLE DEVICE N/A 06/26/2015   Procedure: Lead Revision/Repair;  Surgeon: Will Meredith Leeds, MD;  Location: [place] CV LAB;  Service: Cardiovascular;  Laterality: N/A;  . INSERT / REPLACE / REMOVE PACEMAKER  06/24/2015  . JOINT REPLACEMENT    . SQUAMOUS CELL CARCINOMA EXCISION Left X 3   "lower leg"  . TEE WITHOUT CARDIOVERSION N/A 03/07/2015   Procedure: TRANSESOPHAGEAL ECHOCARDIOGRAM (TEE);  Surgeon: Dorothy Spark, MD;  Location: [place];  Service: Cardiovascular;  Laterality: N/A;  . TEE WITHOUT CARDIOVERSION N/A 03/25/2015   Procedure: TRANSESOPHAGEAL ECHOCARDIOGRAM (TEE);  Surgeon: Burnell Blanks, MD;  Location: [place];  Service: Open Heart Surgery;  Laterality: N/A;  . TONSILLECTOMY    . TOTAL SHOULDER ARTHROPLASTY Right 2007   "wore it out playing tennis"  . TRANSCATHETER AORTIC VALVE REPLACEMENT, TRANSFEMORAL N/A 03/25/2015   Procedure: TRANSCATHETER AORTIC VALVE REPLACEMENT, TRANSFEMORAL;  Surgeon: Burnell Blanks, MD;  Location: [place];  Service: Open Heart  Surgery;  Laterality: N/A;   Social History   Socioeconomic History  . Marital status: Married    Spouse name: Not on file  . Number of children: Not on file  . Years of education: Not on file  . Highest education level: Not on file  Occupational History  . Occupation: Retired  Scientific laboratory technician  . Financial resource strain: Not on file  . Food insecurity:    Worry: Not on file    Inability: Not on file  . Transportation needs:    Medical: Not on file    Non-medical: Not on file  Tobacco Use  . Smoking status: Never Smoker  . Smokeless tobacco: Never Used  Substance and Sexual Activity  .  Alcohol use: No    Alcohol/week: 0.0 standard drinks  . Drug use: No  . Sexual activity: Not Currently  Lifestyle  . Physical activity:    Days per week: Not on file    Minutes per session: Not on file  . Stress: Not on file  Relationships  . Social connections:    Talks on phone: Not on file    Gets together: Not on file    Attends religious service: Not on file    Active member of club or organization: Not on file    Attends meetings of clubs or organizations: Not on file    Relationship status: Not on file  . Intimate partner violence:    Fear of current or ex partner: Not on file    Emotionally abused: Not on file    Physically abused: Not on file    Forced sexual activity: Not on file  Other Topics Concern  . Not on file  Social History Narrative   Patient lives in [place] with his wife. 2nd marriage    4 children    He goes to the [facility] three days a week    Review of Systems: General: negative for chills, fever, night sweats or weight changes.  Cardiovascular: negative for chest pain, dyspnea on exertion, edema, orthopnea, palpitations, paroxysmal nocturnal dyspnea or shortness of breath Dermatological: negative for rash Respiratory: negative for cough or wheezing Urologic: negative for hematuria Abdominal: negative for nausea, vomiting, diarrhea, bright red blood per  rectum, melena, or hematemesis Neurologic: negative for visual changes, syncope, or dizziness All other systems reviewed and are otherwise negative except as noted above.  Physical Exam:  Blood pressure 122/62, pulse 64, height 5' 8.75" (1.746 m), weight 212 lb 12.8 oz (96.5 kg), SpO2 92 %.  General appearance: alert, cooperative and no distress Neck: no carotid bruit and no JVD Lungs: clear to auscultation bilaterally Heart: regular rate and rhythm, S1, S2 normal, no murmur, click, rub or gallop Extremities: extremities normal, atraumatic, no cyanosis or edema Pulses: 2+ and symmetric Skin: Skin color, texture, turgor normal. No rashes or lesions Neurologic: Grossly normal  EKG not performed today    ASSESSMENT AND PLAN:   1. Aortic Valve Disease: s/p TAVR in 2016. His most recent 2D echo 03/2016 showed normal transaortic gradients with mean of 11 mmHg.  He is asymptomatic.  No murmurs on his physical exam.  2. HOCM: on BB therapy with metoprolol. He has a PPM. He denies CP, dyspnea, palpitations, one isolated episode of dizziness but no syncope/ near syncope.   3. PPM: implanted for 2:1 symptomatic heart block. Followed by Dr. Lovena Le. St Jude device. Today's ECG showed AV sequential pacing, last interrogation in December and functioning well.   4. PAF: RRR on physical exam. Rate is controlled with metoprolol. He is on chronic anticoagulation with Coumadin. INRs are followed in our coumadin clinic. HR currently 60s. No bleeding.  5. HTN: controlled on current regimen.   6. HLD: continue conservative medical management with fish oil, given only minimal CAD noted on [facility] in 2016 as well as his advanced age. He has h/o statin intolerance with pravastatin in the past.   Follow-Up w/ Dr. Meda Coffee in 6 months.  Labs prior to the next appointment.  Robert Dawley, MD [facility] 04/14/2018 3:57 PM

## 2018-04-18 LAB — CUP PACEART REMOTE DEVICE CHECK
Battery Voltage: 2.96 V
Brady Statistic AP VP Percent: 76 %
Brady Statistic AS VP Percent: 24 %
Brady Statistic RA Percent Paced: 75 %
Implantable Lead Implant Date: 20161108
Implantable Lead Location: 753859
Lead Channel Impedance Value: 440 Ohm
Lead Channel Pacing Threshold Amplitude: 0.75 V
Lead Channel Pacing Threshold Pulse Width: 0.5 ms
Lead Channel Sensing Intrinsic Amplitude: 12 mV
Lead Channel Setting Pacing Amplitude: 0.875
Lead Channel Setting Pacing Amplitude: 2 V
Lead Channel Setting Sensing Sensitivity: 5 mV
MDC IDC LEAD IMPLANT DT: 20161108
MDC IDC LEAD LOCATION: 753860
MDC IDC MSMT BATTERY REMAINING LONGEVITY: 109 mo
MDC IDC MSMT BATTERY REMAINING PERCENTAGE: 95.5 %
MDC IDC MSMT LEADCHNL RA IMPEDANCE VALUE: 440 Ohm
MDC IDC MSMT LEADCHNL RA PACING THRESHOLD PULSEWIDTH: 0.5 ms
MDC IDC MSMT LEADCHNL RA SENSING INTR AMPL: 5 mV
MDC IDC MSMT LEADCHNL RV PACING THRESHOLD AMPLITUDE: 0.625 V
MDC IDC PG IMPLANT DT: 20161108
MDC IDC PG SERIAL: 7825693
MDC IDC SESS DTM: 20190806060013
MDC IDC SET LEADCHNL RV PACING PULSEWIDTH: 0.5 ms
MDC IDC STAT BRADY AP VS PERCENT: 1 %
MDC IDC STAT BRADY AS VS PERCENT: 1 %
MDC IDC STAT BRADY RV PERCENT PACED: 99 %

## 2018-05-03 ENCOUNTER — Ambulatory Visit (INDEPENDENT_AMBULATORY_CARE_PROVIDER_SITE_OTHER): Payer: Medicare Other | Admitting: Pharmacist Clinician (PhC)/ Clinical Pharmacy Specialist

## 2018-05-03 DIAGNOSIS — Z952 Presence of prosthetic heart valve: Secondary | ICD-10-CM | POA: Diagnosis not present

## 2018-05-03 DIAGNOSIS — I4891 Unspecified atrial fibrillation: Secondary | ICD-10-CM

## 2018-05-03 DIAGNOSIS — Z5181 Encounter for therapeutic drug level monitoring: Secondary | ICD-10-CM | POA: Diagnosis not present

## 2018-05-03 LAB — POCT INR: INR: 2.6 (ref 2.0–3.0)

## 2018-05-23 ENCOUNTER — Other Ambulatory Visit: Payer: Self-pay | Admitting: Cardiology

## 2018-05-24 DIAGNOSIS — H353221 Exudative age-related macular degeneration, left eye, with active choroidal neovascularization: Secondary | ICD-10-CM | POA: Diagnosis not present

## 2018-06-14 ENCOUNTER — Ambulatory Visit (INDEPENDENT_AMBULATORY_CARE_PROVIDER_SITE_OTHER): Payer: Medicare Other | Admitting: Pharmacist Clinician (PhC)/ Clinical Pharmacy Specialist

## 2018-06-14 DIAGNOSIS — I4891 Unspecified atrial fibrillation: Secondary | ICD-10-CM | POA: Diagnosis not present

## 2018-06-14 DIAGNOSIS — Z5181 Encounter for therapeutic drug level monitoring: Secondary | ICD-10-CM

## 2018-06-14 DIAGNOSIS — Z952 Presence of prosthetic heart valve: Secondary | ICD-10-CM | POA: Diagnosis not present

## 2018-06-14 LAB — POCT INR: INR: 2.5 (ref 2.0–3.0)

## 2018-06-20 ENCOUNTER — Ambulatory Visit (INDEPENDENT_AMBULATORY_CARE_PROVIDER_SITE_OTHER): Payer: Medicare Other | Admitting: *Deleted

## 2018-06-20 DIAGNOSIS — R001 Bradycardia, unspecified: Secondary | ICD-10-CM

## 2018-06-20 DIAGNOSIS — I442 Atrioventricular block, complete: Secondary | ICD-10-CM

## 2018-06-21 NOTE — Progress Notes (Signed)
Remote pacemaker transmission.   

## 2018-06-25 ENCOUNTER — Encounter: Payer: Self-pay | Admitting: Cardiology

## 2018-06-28 DIAGNOSIS — H353221 Exudative age-related macular degeneration, left eye, with active choroidal neovascularization: Secondary | ICD-10-CM | POA: Diagnosis not present

## 2018-06-28 DIAGNOSIS — H43813 Vitreous degeneration, bilateral: Secondary | ICD-10-CM | POA: Diagnosis not present

## 2018-07-12 ENCOUNTER — Encounter: Payer: Self-pay | Admitting: Internal Medicine

## 2018-07-12 ENCOUNTER — Ambulatory Visit (INDEPENDENT_AMBULATORY_CARE_PROVIDER_SITE_OTHER): Payer: Medicare Other | Admitting: Internal Medicine

## 2018-07-12 VITALS — BP 120/60 | HR 60 | Ht 68.75 in | Wt 221.0 lb

## 2018-07-12 DIAGNOSIS — Z950 Presence of cardiac pacemaker: Secondary | ICD-10-CM | POA: Diagnosis not present

## 2018-07-12 DIAGNOSIS — I10 Essential (primary) hypertension: Secondary | ICD-10-CM | POA: Diagnosis not present

## 2018-07-12 DIAGNOSIS — I442 Atrioventricular block, complete: Secondary | ICD-10-CM | POA: Diagnosis not present

## 2018-07-12 NOTE — Progress Notes (Signed)
HPI [name] returns today for ongoing evaluation of his PPM. He is a pleasant 84 yo man with CHB, s/p PPM insertion who has recently moved from his house to [facility]. He feels well and is exercising regularly. No chest pain or sob. No edema. Since I saw him last, he has undergone TAVR. He feels well with no chest pain. He does admit to being a bit more sedentary. No Known Allergies   Current Outpatient Medications  Medication Sig Dispense Refill  . acetaminophen (TYLENOL) 325 MG tablet Take 1-2 tablets (325-650 mg total) by mouth every 4 (four) hours as needed for mild pain.    Marland Kitchen docusate sodium (COLACE) 100 MG capsule Take 300 mg by mouth at bedtime.     . metoprolol tartrate (LOPRESSOR) 25 MG tablet TAKE 1/2 TABLET BY MOUTH TWICE A DAY 90 tablet 2  . Multiple Vitamins-Minerals (PRESERVISION AREDS 2+MULTI VIT PO) Take 1 tablet by mouth 2 (two) times daily.    . Omega-3 Fatty Acids (FISH OIL) 1000 MG CAPS Take 1,000 mg by mouth 2 (two) times daily.    . tamsulosin (FLOMAX) 0.4 MG CAPS capsule TAKE ONE CAPSULE BY MOUTH 3 TIMES A WEEK ON MONDAYS, WEDNESDAYS, AND FRIDAYS 100 capsule 4  . warfarin (COUMADIN) 3 MG tablet Take 1 tablet daily as directed by coumadin clinic 90 tablet 1   No current facility-administered medications for this visit.      Past Medical History:  Diagnosis Date  . Atrial fibrillation ([place])    a. dx after lead revision 06/26/15 >> anticoag not started due to recent pericardial effusion in setting of perforation and lead revision  . Bradycardia    a. HR 30s in clinic 06/2015 - BB discontinued.  . DEGENERATIVE JOINT DISEASE, SPINE   . ERECTILE DYSFUNCTION   . Essential hypertension   . First degree AV block   . HEARING LOSS   . Heart block    s/p Pacemaker  . Hyperlipidemia   . Hypertrophic obstructive cardiomyopathy ([name])   . LBBB (left bundle branch block)   . OA (osteoarthritis)    both hips  . Pericardial effusion    a. micorpeforation s/p  lead revision 06/26/15  . Presence of permanent cardiac pacemaker 06/24/2015   St Jude Assurity  . Prostate cancer [facility]) 2010   "external radiation; 40 treatments"  . 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 Severe aortic stenosis    a. s/p TAVR 03/2015. Pre-op  LHC 02/2015: minor nonobstructive CAD.  Marland Kitchen Squamous cell skin cancer    left lower leg  . Stroke ([name]) 02/19/2014   Small infarct high posterior left frontal lobe on MRI   . TIA (transient ischemic attack) 01/21/2014   5 min spell aphasia without assoc sx      ROS:   All systems reviewed and negative except as noted in the HPI.   Past Surgical History:  Procedure Laterality Date  . ANTERIOR CERVICAL DECOMP/DISCECTOMY FUSION  ~ 2000   Dr. Louanne Skye  . BACK SURGERY    . CARDIAC CATHETERIZATION N/A 02/20/2015   Procedure: Right/Left Heart Cath and Coronary Angiography;  Surgeon: Sherren Mocha, MD;  Location: [place] CV LAB;  Service: Cardiovascular;  Laterality: N/A;  . CARDIAC VALVE REPLACEMENT    . CATARACT EXTRACTION W/ INTRAOCULAR LENS  IMPLANT, BILATERAL  2004; 2010   right; left  .  EP IMPLANTABLE DEVICE N/A 06/24/2015   Procedure: Pacemaker Implant;  Surgeon: Evans Lance, MD;  Location: [place] CV LAB;  Service: Cardiovascular; St Jude Assurity   . EP IMPLANTABLE DEVICE N/A 06/26/2015   Procedure: Lead Revision/Repair;  Surgeon: Will Meredith Leeds, MD;  Location: [place] CV LAB;  Service: Cardiovascular;  Laterality: N/A;  . INSERT / REPLACE / REMOVE PACEMAKER  06/24/2015  . JOINT REPLACEMENT    . SQUAMOUS CELL CARCINOMA EXCISION Left X 3   "lower leg"  . TEE WITHOUT CARDIOVERSION N/A 03/07/2015   Procedure: TRANSESOPHAGEAL ECHOCARDIOGRAM (TEE);  Surgeon: Dorothy Spark, MD;  Location: [place];  Service: Cardiovascular;  Laterality: N/A;  . TEE WITHOUT CARDIOVERSION N/A 03/25/2015   Procedure:  TRANSESOPHAGEAL ECHOCARDIOGRAM (TEE);  Surgeon: Burnell Blanks, MD;  Location: [place];  Service: Open Heart Surgery;  Laterality: N/A;  . TONSILLECTOMY    . TOTAL SHOULDER ARTHROPLASTY Right 2007   "wore it out playing tennis"  . TRANSCATHETER AORTIC VALVE REPLACEMENT, TRANSFEMORAL N/A 03/25/2015   Procedure: TRANSCATHETER AORTIC VALVE REPLACEMENT, TRANSFEMORAL;  Surgeon: Burnell Blanks, MD;  Location: [place];  Service: Open Heart Surgery;  Laterality: N/A;     Family History  Problem Relation Age of Onset  . Heart disease Mother   . CVA Mother   . Stroke Mother   . CVA Father   . Cancer Father   . Brain cancer Sister   . Prostate cancer Brother   . Heart disease Brother   . Lung cancer Brother   . Heart attack Brother   . Hypertension Neg Hx      Social History   Socioeconomic History  . Marital status: Married    Spouse name: Not on file  . Number of children: Not on file  . Years of education: Not on file  . Highest education level: Not on file  Occupational History  . Occupation: Retired  Scientific laboratory technician  . Financial resource strain: Not on file  . Food insecurity:    Worry: Not on file    Inability: Not on file  . Transportation needs:    Medical: Not on file    Non-medical: Not on file  Tobacco Use  . Smoking status: Never Smoker  . Smokeless tobacco: Never Used  Substance and Sexual Activity  . Alcohol use: No    Alcohol/week: 0.0 standard drinks  . Drug use: No  . Sexual activity: Not Currently  Lifestyle  . Physical activity:    Days per week: Not on file    Minutes per session: Not on file  . Stress: Not on file  Relationships  . Social connections:    Talks on phone: Not on file    Gets together: Not on file    Attends religious service: Not on file    Active member of club or organization: Not on file    Attends meetings of clubs or organizations: Not on file    Relationship status: Not on file  . Intimate partner violence:    Fear  of current or ex partner: Not on file    Emotionally abused: Not on file    Physically abused: Not on file    Forced sexual activity: Not on file  Other Topics Concern  . Not on file  Social History Narrative   Patient lives in [place] with his wife. 2nd marriage    4 children    He goes to the [facility] three days a week  BP 120/60   Pulse 60   Ht 5' 8.75" (1.746 m)   Wt 221 lb (100.2 kg)   BMI 32.87 kg/m   Physical Exam:  Well appearing NAD HEENT: Unremarkable Neck:  No JVD, no thyromegally Lymphatics:  No adenopathy Back:  No CVA tenderness Lungs:  Clear with no wheezes HEART:  Regular rate rhythm, no murmurs, no rubs, no clicks Abd:  soft, positive bowel sounds, no organomegally, no rebound, no guarding Ext:  2 plus pulses, no edema, no cyanosis, no clubbing Skin:  No rashes no nodules Neuro:  CN II through XII intact, motor grossly intact  DEVICE  Normal device function.  See PaceArt for details.   Assess/Plan: 1. CHB - he is asymptomatic, s/p PPM insertion. 2. PPM - his St. Jude DDD PM is working normally. 3. AS - he is s/p TAVR and on exam, I do not appreciate any residual stenosis.  [email or web address] [email or web address].

## 2018-07-12 NOTE — Patient Instructions (Signed)

## 2018-07-15 LAB — CUP PACEART INCLINIC DEVICE CHECK
Battery Remaining Longevity: 105 mo
Battery Voltage: 2.96 V
Brady Statistic RA Percent Paced: 75 %
Brady Statistic RV Percent Paced: 99.84 %
Implantable Lead Implant Date: 20161108
Implantable Lead Location: 753860
Implantable Pulse Generator Implant Date: 20161108
Lead Channel Impedance Value: 437.5 Ohm
Lead Channel Pacing Threshold Amplitude: 0.75 V
Lead Channel Pacing Threshold Pulse Width: 0.5 ms
Lead Channel Sensing Intrinsic Amplitude: 12 mV
Lead Channel Sensing Intrinsic Amplitude: 5 mV
Lead Channel Setting Pacing Amplitude: 2 V
Lead Channel Setting Pacing Pulse Width: 0.5 ms
MDC IDC LEAD IMPLANT DT: 20161108
MDC IDC LEAD LOCATION: 753859
MDC IDC MSMT LEADCHNL RA PACING THRESHOLD PULSEWIDTH: 0.5 ms
MDC IDC MSMT LEADCHNL RV IMPEDANCE VALUE: 450 Ohm
MDC IDC MSMT LEADCHNL RV PACING THRESHOLD AMPLITUDE: 0.625 V
MDC IDC PG SERIAL: 7825693
MDC IDC SESS DTM: 20191127155144
MDC IDC SET LEADCHNL RV PACING AMPLITUDE: 0.875
MDC IDC SET LEADCHNL RV SENSING SENSITIVITY: 5 mV

## 2018-07-26 ENCOUNTER — Ambulatory Visit: Payer: Medicare Other | Admitting: Pharmacist Clinician (PhC)/ Clinical Pharmacy Specialist

## 2018-07-26 DIAGNOSIS — Z952 Presence of prosthetic heart valve: Secondary | ICD-10-CM | POA: Diagnosis not present

## 2018-07-26 DIAGNOSIS — Z5181 Encounter for therapeutic drug level monitoring: Secondary | ICD-10-CM | POA: Diagnosis not present

## 2018-07-26 DIAGNOSIS — I4891 Unspecified atrial fibrillation: Secondary | ICD-10-CM

## 2018-07-26 LAB — POCT INR: INR: 2.9 (ref 2.0–3.0)

## 2018-07-26 NOTE — Patient Instructions (Signed)
Description   Continue taking the same dosage 1 tablet (3mg ) daily.  Recheck INR in 6 weeks. Coumadin Clinic [phone]

## 2018-07-31 DIAGNOSIS — D225 Melanocytic nevi of trunk: Secondary | ICD-10-CM | POA: Diagnosis not present

## 2018-07-31 DIAGNOSIS — D692 Other nonthrombocytopenic purpura: Secondary | ICD-10-CM | POA: Diagnosis not present

## 2018-07-31 DIAGNOSIS — L821 Other seborrheic keratosis: Secondary | ICD-10-CM | POA: Diagnosis not present

## 2018-07-31 DIAGNOSIS — L570 Actinic keratosis: Secondary | ICD-10-CM | POA: Diagnosis not present

## 2018-07-31 DIAGNOSIS — Z85828 Personal history of other malignant neoplasm of skin: Secondary | ICD-10-CM | POA: Diagnosis not present

## 2018-08-01 DIAGNOSIS — H353221 Exudative age-related macular degeneration, left eye, with active choroidal neovascularization: Secondary | ICD-10-CM | POA: Diagnosis not present

## 2018-08-11 ENCOUNTER — Ambulatory Visit (INDEPENDENT_AMBULATORY_CARE_PROVIDER_SITE_OTHER): Payer: Medicare Other | Admitting: Adult Health

## 2018-08-11 ENCOUNTER — Encounter: Payer: Self-pay | Admitting: Adult Health

## 2018-08-11 VITALS — BP 122/60 | HR 61 | Temp 97.9°F | Resp 20 | Ht 69.0 in | Wt 222.0 lb

## 2018-08-11 DIAGNOSIS — Z0000 Encounter for general adult medical examination without abnormal findings: Secondary | ICD-10-CM | POA: Diagnosis not present

## 2018-08-11 DIAGNOSIS — I10 Essential (primary) hypertension: Secondary | ICD-10-CM | POA: Diagnosis not present

## 2018-08-11 DIAGNOSIS — E785 Hyperlipidemia, unspecified: Secondary | ICD-10-CM | POA: Diagnosis not present

## 2018-08-11 LAB — COMPREHENSIVE METABOLIC PANEL
ALT: 11 U/L (ref 0–53)
AST: 12 U/L (ref 0–37)
Albumin: 3.8 g/dL (ref 3.5–5.2)
Alkaline Phosphatase: 48 U/L (ref 39–117)
BUN: 18 mg/dL (ref 6–23)
CHLORIDE: 106 meq/L (ref 96–112)
CO2: 28 mEq/L (ref 19–32)
Calcium: 9.3 mg/dL (ref 8.4–10.5)
Creatinine, Ser: 0.89 mg/dL (ref 0.40–1.50)
GFR: 84.96 mL/min (ref >60.00)
GLUCOSE: 96 mg/dL (ref 70–99)
POTASSIUM: 4.3 meq/L (ref 3.5–5.1)
SODIUM: 141 meq/L (ref 135–145)
TOTAL PROTEIN: 5.8 g/dL — AB (ref 6.0–8.3)
Total Bilirubin: 0.5 mg/dL (ref 0.2–1.2)

## 2018-08-11 LAB — CBC WITH DIFFERENTIAL/PLATELET
Basophils Absolute: 0 10*3/uL (ref 0.0–0.1)
Basophils Relative: 0.7 % (ref 0.0–3.0)
EOS PCT: 1.6 % (ref 0.0–5.0)
Eosinophils Absolute: 0.1 10*3/uL (ref 0.0–0.7)
HCT: 45.7 % (ref 39.0–52.0)
Hemoglobin: 15.4 g/dL (ref 13.0–17.0)
LYMPHS ABS: 1.2 10*3/uL (ref 0.7–4.0)
Lymphocytes Relative: 23.5 % (ref 12.0–46.0)
MCHC: 33.7 g/dL (ref 30.0–36.0)
MCV: 89.9 fl (ref 78.0–100.0)
MONO ABS: 0.5 10*3/uL (ref 0.1–1.0)
Monocytes Relative: 9.6 % (ref 3.0–12.0)
Neutro Abs: 3.4 10*3/uL (ref 1.4–7.7)
Neutrophils Relative %: 64.6 % (ref 43.0–77.0)
Platelets: 183 10*3/uL (ref 150.0–400.0)
RBC: 5.09 Mil/uL (ref 4.22–5.81)
RDW: 13.7 % (ref 11.5–15.5)
WBC: 5.3 10*3/uL (ref 4.0–10.5)

## 2018-08-11 LAB — LIPID PANEL
CHOL/HDL RATIO: 5
Cholesterol: 200 mg/dL (ref 0–200)
HDL: 39.4 mg/dL (ref >39.00)
NonHDL: 160.74
Triglycerides: 252 mg/dL — ABNORMAL HIGH (ref 0.0–149.0)
VLDL: 50.4 mg/dL — AB (ref 0.0–40.0)

## 2018-08-11 LAB — LDL CHOLESTEROL, DIRECT: Direct LDL: 127 mg/dL

## 2018-08-11 LAB — TSH: TSH: 2.79 u[IU]/mL (ref 0.35–4.50)

## 2018-08-11 NOTE — Progress Notes (Signed)
Subjective:    Patient ID: Robert Anderson, male    DOB: [birth year], 84 y.o.   MRN: [id number]  HPI Patient presents for yearly preventative medicine examination. 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   He continues to live in independent living facility. Overall, he is doing well and reports" I feel really good"   His only interval history that he reports is that he has started getting injections for macular degeneration.   Aortic Valve Disease - s/p TAVR in 2016.  Managed by cardiology.  He is asymptomatic  PAF- Rate control with metoprolol.  He is on chronic anticoagulation with Coumadin.  His INRs are followed by our Coumadin clinic.  Essential Hypertension - controlled on current regimen   HLD-he continues with conservative management with fish oil.  He does have a history of statin intolerance with pravastatin in the past.  All immunizations and health maintenance protocols were reviewed with the patient and needed orders were placed. Up to date on vaccinations   Appropriate screening laboratory values were ordered for the patient including screening of hyperlipidemia, renal function and hepatic function.  Medication reconciliation,  past medical history, social history, problem list and allergies were reviewed in detail with the patient  Goals were established with regard to weight loss,  exercise, and  diet in compliance with medications. He continues to exercise at the [facility] 2x a week.   Wt Readings from Last 3 Encounters:  08/11/18 222 lb (100.7 kg)  07/12/18 221 lb (100.2 kg)  04/14/18 212 lb 12.8 oz (96.5 kg)    End of life planning was discussed. He has an advanced directive and living will.   He has no acute complaints today   Review of Systems  Constitutional: Negative.   HENT: Positive for hearing loss.   Eyes: Negative.   Respiratory: Negative.   Cardiovascular: Negative.   Gastrointestinal: Negative.   Endocrine: Negative.   Genitourinary: Negative.   Musculoskeletal: Negative.   Skin: Negative.   Allergic/Immunologic: Negative.   Neurological: Negative.   Hematological: Negative.   Psychiatric/Behavioral: Negative.   All other systems reviewed and are negative.  Past Medical History:  Diagnosis Date  . Atrial fibrillation ([place])    a. dx after lead revision 06/26/15 >> anticoag not started due to recent pericardial effusion in setting of perforation and lead revision  . Bradycardia    a. HR 30s in clinic 06/2015 - BB discontinued.  . DEGENERATIVE JOINT DISEASE, SPINE   . ERECTILE DYSFUNCTION   . Essential hypertension   . First degree AV block   . HEARING LOSS   . Heart block    s/p Pacemaker  . Hyperlipidemia   . Hypertrophic obstructive cardiomyopathy ([place])   . LBBB (left bundle branch block)   . OA (osteoarthritis)    both hips  . Pericardial effusion    a. micorpeforation s/p lead revision 06/26/15  . Presence of permanent cardiac pacemaker 06/24/2015   St Jude Assurity  . Prostate cancer [facility]) 2010   "external radiation; 40 treatments"  .  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 Severe aortic stenosis    a. s/p TAVR 03/2015. Pre-op  LHC 02/2015: minor nonobstructive CAD.  Marland Kitchen Squamous cell skin cancer    left lower leg    . Stroke ([place]) 02/19/2014   Small infarct high posterior left frontal lobe on MRI   . TIA (transient ischemic attack) 01/21/2014   5 min spell aphasia without assoc sx      Social History   Socioeconomic History  . Marital status: Married    Spouse name: Not on file  . Number of children: Not on file  . Years of education: Not on file  . Highest education level: Not on file  Occupational History  . Occupation: Retired  Scientific laboratory technician  . Financial resource strain: Not on file  . Food insecurity:    Worry: Not on file    Inability: Not on file  . Transportation needs:    Medical: Not on file    Non-medical: Not on file  Tobacco Use  . Smoking status: Never Smoker  . Smokeless tobacco: Never Used  Substance and Sexual Activity  . Alcohol use: No    Alcohol/week: 0.0 standard drinks  . Drug use: No  . Sexual activity: Not Currently  Lifestyle  . Physical activity:    Days per week: Not on file    Minutes per session: Not on file  . Stress: Not on file  Relationships  . Social connections:    Talks on phone: Not on file    Gets together: Not on file    Attends religious service: Not on file    Active member of club or organization: Not on file    Attends meetings of clubs or organizations: Not on file    Relationship status: Not on file  . Intimate partner violence:    Fear of current or ex partner: Not on file    Emotionally abused: Not on file    Physically abused: Not on file    Forced sexual activity: Not on file  Other Topics Concern  . Not on file  Social History Narrative   Patient lives in [place] with his wife. 2nd marriage    4 children    He goes to the [facility] three days a week    Past Surgical History:  Procedure Laterality Date  . ANTERIOR CERVICAL DECOMP/DISCECTOMY FUSION  ~ 2000   Dr. Louanne Skye  . BACK SURGERY    . CARDIAC CATHETERIZATION N/A 02/20/2015   Procedure: Right/Left Heart Cath and Coronary Angiography;  Surgeon: Sherren Mocha, MD;   Location: [place] [facility];  Service: Cardiovascular;  Laterality: N/A;  . CARDIAC VALVE REPLACEMENT    . CATARACT EXTRACTION W/ INTRAOCULAR LENS  IMPLANT, BILATERAL  2004; 2010   right; left  . EP IMPLANTABLE DEVICE N/A 06/24/2015   Procedure: Pacemaker Implant;  Surgeon: Evans Lance, MD;  Location: [place] [facility];  Service: Cardiovascular; St Jude Assurity   . EP IMPLANTABLE DEVICE N/A 06/26/2015   Procedure: Lead Revision/Repair;  Surgeon: Will Meredith Leeds, MD;  Location: [place] [facility];  Service: Cardiovascular;  Laterality: N/A;  . INSERT / REPLACE / REMOVE PACEMAKER  06/24/2015  . JOINT REPLACEMENT    . SQUAMOUS CELL CARCINOMA EXCISION Left X 3   "lower leg"  . TEE WITHOUT CARDIOVERSION N/A 03/07/2015   Procedure: TRANSESOPHAGEAL ECHOCARDIOGRAM (TEE);  Surgeon: Dorothy Spark, MD;  Location: [place];  Service: Cardiovascular;  Laterality: N/A;  . TEE WITHOUT CARDIOVERSION N/A 03/25/2015   Procedure: TRANSESOPHAGEAL ECHOCARDIOGRAM (TEE);  Surgeon: Burnell Blanks, MD;  Location: [place];  Service: Open Heart Surgery;  Laterality: N/A;  . TONSILLECTOMY    . TOTAL SHOULDER ARTHROPLASTY Right 2007   "wore it out playing tennis"  . TRANSCATHETER AORTIC VALVE REPLACEMENT, TRANSFEMORAL N/A 03/25/2015   Procedure: TRANSCATHETER AORTIC VALVE REPLACEMENT, TRANSFEMORAL;  Surgeon: Burnell Blanks, MD;  Location: [place];  Service: Open Heart Surgery;  Laterality: N/A;    Family History  Problem Relation Age of Onset  . Heart disease Mother   . CVA Mother   . Stroke Mother   . CVA Father   . Cancer Father   . Brain cancer Sister   . Prostate cancer Brother   . Heart disease Brother   . Lung cancer Brother   . Heart attack Brother   . Hypertension Neg Hx     No Known Allergies  Current Outpatient Medications on File Prior to Visit  Medication Sig Dispense Refill  . acetaminophen (TYLENOL) 325 MG tablet Take 1-2 tablets (325-650 mg total) by mouth every 4  (four) hours as needed for mild pain.    Marland Kitchen docusate sodium (COLACE) 100 MG capsule Take 300 mg by mouth at bedtime.     . metoprolol tartrate (LOPRESSOR) 25 MG tablet TAKE 1/2 TABLET BY MOUTH TWICE A DAY 90 tablet 2  . Multiple Vitamins-Minerals (PRESERVISION AREDS 2+MULTI VIT PO) Take 1 tablet by mouth 2 (two) times daily.    . Omega-3 Fatty Acids (FISH OIL) 1000 MG CAPS Take 1,000 mg by mouth 2 (two) times daily.    . tamsulosin (FLOMAX) 0.4 MG CAPS capsule TAKE ONE CAPSULE BY MOUTH 3 TIMES A WEEK ON MONDAYS, WEDNESDAYS, AND FRIDAYS 100 capsule 4  . UNABLE TO FIND Med Name: PRO VISION    . warfarin (COUMADIN) 3 MG tablet Take 1 tablet daily as directed by coumadin clinic 90 tablet 1   No current facility-administered medications on file prior to visit.     BP 122/60 (BP Location: Right Arm, Patient Position: Sitting, Cuff Size: Normal)   Pulse 61   Temp 97.9 F (36.6 C) (Oral)   Resp 20 Comment: after walking  Ht 5\' 9"  (1.753 m)   Wt 222 lb (100.7 kg)   SpO2 95%   BMI 32.78 kg/m       Objective:   Physical Exam Vitals signs and nursing note reviewed.  Constitutional:      Appearance: Normal appearance. He is normal weight.  HENT:     Head: Normocephalic and atraumatic.     Right Ear: Tympanic membrane, ear canal and external ear normal. There is no impacted cerumen.     Left Ear: Tympanic membrane, ear canal and external ear normal. There is no impacted cerumen.     Nose: Nose normal. No congestion or rhinorrhea.     Mouth/Throat:     Mouth: Mucous membranes are moist.     Pharynx: Oropharynx is clear.  Eyes:     Extraocular Movements: Extraocular movements intact.     Conjunctiva/sclera: Conjunctivae normal.     Pupils: Pupils are equal, round, and reactive to light.  Cardiovascular:     Rate and Rhythm: Normal rate and regular rhythm.     Pulses: Normal pulses.     Heart sounds: Normal heart sounds. No murmur. No friction rub. No  gallop.   Pulmonary:     Effort:  Pulmonary effort is normal. No respiratory distress.     Breath sounds: Normal breath sounds. No stridor. No wheezing, rhonchi or rales.  Chest:     Chest wall: No tenderness.  Abdominal:     General: Abdomen is flat. Bowel sounds are normal.     Palpations: Abdomen is soft.  Musculoskeletal: Normal range of motion.     Right lower leg: Edema present.     Left lower leg: Edema present.     Comments: Walks with slow steady gait using rolling walker.   Skin:    General: Skin is warm and dry.     Capillary Refill: Capillary refill takes less than 2 seconds.  Neurological:     General: No focal deficit present.     Mental Status: He is alert and oriented to person, place, and time. Mental status is at baseline.  Psychiatric:        Mood and Affect: Mood normal.        Behavior: Behavior normal.        Thought Content: Thought content normal.        Judgment: Judgment normal.       Assessment & Plan:  1. Routine general medical examination at a health care facility - One year follow up as directed - Continue to follow up with cardiology as directed  - CBC with Differential/Platelet - Comprehensive metabolic panel - Lipid panel - TSH  2. Essential hypertension - Well controlled. No change in medication  - CBC with Differential/Platelet - Comprehensive metabolic panel - Lipid panel - TSH  3. Hyperlipidemia, unspecified hyperlipidemia type - Continue with fish oil  - CBC with Differential/Platelet - Comprehensive metabolic panel - Lipid panel - TSH  Dorothyann Peng, NP

## 2018-08-17 ENCOUNTER — Telehealth: Payer: Self-pay | Admitting: Adult Health

## 2018-08-17 NOTE — Telephone Encounter (Signed)
Patient calling back for lab results.   Copied from [place] [id number]. Topic: Quick Communication - Lab Results (Clinic Use ONLY) >> Aug 17, 2018  4:22 PM Miles Costain T, CMA wrote: Called patient to inform them of all lab results from 08/11/18. When patient returns call, triage nurse may disclose results.

## 2018-08-18 NOTE — Telephone Encounter (Signed)
Lab results given and documented in result note 

## 2018-08-20 LAB — CUP PACEART REMOTE DEVICE CHECK
Battery Remaining Longevity: 115 mo
Battery Voltage: 2.98 V
Brady Statistic AS VP Percent: 24 %
Brady Statistic RA Percent Paced: 75 %
Date Time Interrogation Session: 20191105070015
Implantable Lead Implant Date: 20161108
Implantable Lead Location: 753860
Implantable Pulse Generator Implant Date: 20161108
Lead Channel Impedance Value: 430 Ohm
Lead Channel Pacing Threshold Amplitude: 0.625 V
Lead Channel Pacing Threshold Amplitude: 0.75 V
Lead Channel Pacing Threshold Pulse Width: 0.5 ms
Lead Channel Sensing Intrinsic Amplitude: 12 mV
Lead Channel Setting Pacing Amplitude: 2 V
Lead Channel Setting Pacing Pulse Width: 0.5 ms
MDC IDC LEAD IMPLANT DT: 20161108
MDC IDC LEAD LOCATION: 753859
MDC IDC MSMT BATTERY REMAINING PERCENTAGE: 95.5 %
MDC IDC MSMT LEADCHNL RA SENSING INTR AMPL: 5 mV
MDC IDC MSMT LEADCHNL RV IMPEDANCE VALUE: 450 Ohm
MDC IDC MSMT LEADCHNL RV PACING THRESHOLD PULSEWIDTH: 0.5 ms
MDC IDC SET LEADCHNL RV PACING AMPLITUDE: 0.875
MDC IDC SET LEADCHNL RV SENSING SENSITIVITY: 5 mV
MDC IDC STAT BRADY AP VP PERCENT: 76 %
MDC IDC STAT BRADY AP VS PERCENT: 1 %
MDC IDC STAT BRADY AS VS PERCENT: 1 %
MDC IDC STAT BRADY RV PERCENT PACED: 99 %
Pulse Gen Model: 2240
Pulse Gen Serial Number: 7825693

## 2018-09-06 ENCOUNTER — Ambulatory Visit (INDEPENDENT_AMBULATORY_CARE_PROVIDER_SITE_OTHER): Payer: Medicare Other | Admitting: Pharmacist

## 2018-09-06 DIAGNOSIS — Z952 Presence of prosthetic heart valve: Secondary | ICD-10-CM | POA: Diagnosis not present

## 2018-09-06 DIAGNOSIS — Z5181 Encounter for therapeutic drug level monitoring: Secondary | ICD-10-CM | POA: Diagnosis not present

## 2018-09-06 DIAGNOSIS — I4891 Unspecified atrial fibrillation: Secondary | ICD-10-CM

## 2018-09-06 LAB — POCT INR: INR: 2.3 (ref 2.0–3.0)

## 2018-09-19 ENCOUNTER — Ambulatory Visit (INDEPENDENT_AMBULATORY_CARE_PROVIDER_SITE_OTHER): Payer: Medicare Other

## 2018-09-19 DIAGNOSIS — I442 Atrioventricular block, complete: Secondary | ICD-10-CM | POA: Diagnosis not present

## 2018-09-19 DIAGNOSIS — R001 Bradycardia, unspecified: Secondary | ICD-10-CM

## 2018-09-20 LAB — CUP PACEART REMOTE DEVICE CHECK
Battery Remaining Longevity: 114 mo
Battery Remaining Percentage: 95.5 %
Brady Statistic AP VS Percent: 1 %
Brady Statistic AS VP Percent: 27 %
Brady Statistic AS VS Percent: 1 %
Brady Statistic RA Percent Paced: 72 %
Brady Statistic RV Percent Paced: 99 %
Date Time Interrogation Session: 20200204070015
Implantable Lead Implant Date: 20161108
Implantable Lead Implant Date: 20161108
Implantable Lead Location: 753859
Implantable Lead Location: 753860
Implantable Pulse Generator Implant Date: 20161108
Lead Channel Impedance Value: 430 Ohm
Lead Channel Impedance Value: 450 Ohm
Lead Channel Pacing Threshold Amplitude: 0.75 V
Lead Channel Pacing Threshold Pulse Width: 0.5 ms
Lead Channel Pacing Threshold Pulse Width: 0.5 ms
Lead Channel Sensing Intrinsic Amplitude: 12 mV
Lead Channel Sensing Intrinsic Amplitude: 5 mV
Lead Channel Setting Pacing Amplitude: 1 V
Lead Channel Setting Pacing Amplitude: 2 V
Lead Channel Setting Pacing Pulse Width: 0.5 ms
Lead Channel Setting Sensing Sensitivity: 5 mV
MDC IDC MSMT BATTERY VOLTAGE: 2.98 V
MDC IDC MSMT LEADCHNL RA PACING THRESHOLD AMPLITUDE: 0.75 V
MDC IDC STAT BRADY AP VP PERCENT: 73 %
Pulse Gen Model: 2240
Pulse Gen Serial Number: 7825693

## 2018-09-20 NOTE — Progress Notes (Deleted)
Subjective:   Robert Anderson is a 85 y.o. male who presents for Medicare Annual/Subsequent preventive examination.  Review of Systems:  No ROS.  Medicare Wellness Visit. Additional risk factors are reflected in the social history.    Sleep patterns: {SX; SLEEP PATTERNS:18802::"feels rested on waking","does not get up to void","gets up *** times nightly to void","sleeps *** hours nightly"}.   Home Safety/Smoke Alarms: Feels safe in home. Smoke alarms in place.  Living environment; residence and Firearm Safety: {Rehab home environment / accessibility:30080::"no firearms","firearms stored safely"}. Seat Belt Safety/Bike Helmet: Wears seat belt.   Male:   Pap-       Mammo-       Dexa scan-        CCS-  Male:   CCS-     PSA-  Lab Results  Component Value Date   PSA 0.07 (L) 07/29/2016   PSA 0.07 (L) 05/20/2015   PSA 0.07 (L) 05/03/2013       Objective:    Vitals: There were no vitals taken for this visit.  There is no height or weight on file to calculate BMI.  Advanced Directives 07/06/2015 06/25/2015 06/25/2015 06/23/2015 03/25/2015 03/21/2015 03/07/2015  Does Patient Have a Medical Advance Directive? Yes Yes Yes Yes No Yes Yes  Type of Advance Directive Living will;Healthcare Power of Attorney Living will;Healthcare Power of Attorney - Living will;Healthcare Power of Attorney - - -  Does patient want to make changes to medical advance directive? No - Patient declined No - Patient declined - No - Patient declined - - -  Copy of [place] in Chart? No - copy requested No - copy requested No - copy requested No - copy requested - - -    Tobacco Social History   Tobacco Use  Smoking Status Never Smoker  Smokeless Tobacco Never Used     Counseling given: Not Answered   Past Medical History:  Diagnosis Date  . Atrial fibrillation ([place])    a. dx after lead revision 06/26/15 >> anticoag not started due to recent pericardial effusion in setting of perforation  and lead revision  . Bradycardia    a. HR 30s in clinic 06/2015 - BB discontinued.  . DEGENERATIVE JOINT DISEASE, SPINE   . ERECTILE DYSFUNCTION   . Essential hypertension   . First degree AV block   . HEARING LOSS   . Heart block    s/p Pacemaker  . Hyperlipidemia   . Hypertrophic obstructive cardiomyopathy ([place])   . LBBB (left bundle branch block)   . OA (osteoarthritis)    both hips  . Pericardial effusion    a. micorpeforation s/p lead revision 06/26/15  . Presence of permanent cardiac pacemaker 06/24/2015   St Jude Assurity  . Prostate cancer [facility]) 2010   "external radiation; 40 treatments"  . 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 Severe aortic stenosis    a. s/p TAVR 03/2015. Pre-op  LHC 02/2015: minor nonobstructive CAD.  Marland Kitchen Squamous cell skin cancer    left lower leg  . Stroke ([place]) 02/19/2014   Small infarct high posterior left frontal lobe on MRI   . TIA (transient ischemic attack) 01/21/2014   5 min spell aphasia without assoc sx     Past Surgical History:  Procedure Laterality Date  . ANTERIOR CERVICAL DECOMP/DISCECTOMY FUSION  ~ 2000   Dr. Louanne Skye  . BACK  SURGERY    . CARDIAC CATHETERIZATION N/A 02/20/2015   Procedure: Right/Left Heart Cath and Coronary Angiography;  Surgeon: Sherren Mocha, MD;  Location: [place] [facility];  Service: Cardiovascular;  Laterality: N/A;  . CARDIAC VALVE REPLACEMENT    . CATARACT EXTRACTION W/ INTRAOCULAR LENS  IMPLANT, BILATERAL  2004; 2010   right; left  . EP IMPLANTABLE DEVICE N/A 06/24/2015   Procedure: Pacemaker Implant;  Surgeon: Evans Lance, MD;  Location: [place] [facility];  Service: Cardiovascular; St Jude Assurity   . EP IMPLANTABLE DEVICE N/A 06/26/2015   Procedure: Lead Revision/Repair;  Surgeon: Will Meredith Leeds, MD;  Location: [place] [facility];  Service: Cardiovascular;  Laterality: N/A;  . INSERT /  REPLACE / REMOVE PACEMAKER  06/24/2015  . JOINT REPLACEMENT    . SQUAMOUS CELL CARCINOMA EXCISION Left X 3   "lower leg"  . TEE WITHOUT CARDIOVERSION N/A 03/07/2015   Procedure: TRANSESOPHAGEAL ECHOCARDIOGRAM (TEE);  Surgeon: Dorothy Spark, MD;  Location: [facility];  Service: Cardiovascular;  Laterality: N/A;  . TEE WITHOUT CARDIOVERSION N/A 03/25/2015   Procedure: TRANSESOPHAGEAL ECHOCARDIOGRAM (TEE);  Surgeon: Burnell Blanks, MD;  Location: [place];  Service: Open Heart Surgery;  Laterality: N/A;  . TONSILLECTOMY    . TOTAL SHOULDER ARTHROPLASTY Right 2007   "wore it out playing tennis"  . TRANSCATHETER AORTIC VALVE REPLACEMENT, TRANSFEMORAL N/A 03/25/2015   Procedure: TRANSCATHETER AORTIC VALVE REPLACEMENT, TRANSFEMORAL;  Surgeon: Burnell Blanks, MD;  Location: [place];  Service: Open Heart Surgery;  Laterality: N/A;   Family History  Problem Relation Age of Onset  . Heart disease Mother   . CVA Mother   . Stroke Mother   . CVA Father   . Cancer Father   . Brain cancer Sister   . Prostate cancer Brother   . Heart disease Brother   . Lung cancer Brother   . Heart attack Brother   . Hypertension Neg Hx    Social History   Socioeconomic History  . Marital status: Married    Spouse name: Not on file  . Number of children: Not on file  . Years of education: Not on file  . Highest education level: Not on file  Occupational History  . Occupation: Retired  Scientific laboratory technician  . Financial resource strain: Not on file  . Food insecurity:    Worry: Not on file    Inability: Not on file  . Transportation needs:    Medical: Not on file    Non-medical: Not on file  Tobacco Use  . Smoking status: Never Smoker  . Smokeless tobacco: Never Used  Substance and Sexual Activity  . Alcohol use: No    Alcohol/week: 0.0 standard drinks  . Drug use: No  . Sexual activity: Not Currently  Lifestyle  . Physical activity:    Days per week: Not on file    Minutes per session: Not  on file  . Stress: Not on file  Relationships  . Social connections:    Talks on phone: Not on file    Gets together: Not on file    Attends religious service: Not on file    Active member of club or organization: Not on file    Attends meetings of clubs or organizations: Not on file    Relationship status: Not on file  Other Topics Concern  . Not on file  Social History Narrative   Patient lives in [place] with his wife. 2nd marriage  4 children    He [facility] to the [facility] three [facility] a [facility]    Outpatient Encounter Medications as of 09/21/2018  Medication Sig  . acetaminophen (TYLENOL) 325 MG tablet Take 1-2 tablets (325-650 mg total) by mouth every 4 (four) hours as needed for mild pain.  Marland Kitchen docusate sodium (COLACE) 100 MG capsule Take 300 mg by mouth at bedtime.   . metoprolol tartrate (LOPRESSOR) 25 MG tablet TAKE 1/2 TABLET BY MOUTH TWICE A DAY  . Multiple Vitamins-Minerals (PRESERVISION AREDS 2+MULTI VIT PO) Take 1 tablet by mouth 2 (two) times daily.  . Omega-3 Fatty Acids (FISH OIL) 1000 MG CAPS Take 1,000 mg by mouth 2 (two) times daily.  . tamsulosin (FLOMAX) 0.4 MG CAPS capsule TAKE ONE CAPSULE BY MOUTH 3 TIMES A WEEK ON MONDAYS, WEDNESDAYS, AND FRIDAYS  . UNABLE TO FIND Med Name: PRO VISION  . warfarin (COUMADIN) 3 MG tablet Take 1 tablet daily as directed by coumadin clinic   No facility-administered encounter medications on file as of 09/21/2018.     Activities of Daily Living No flowsheet data found.  Patient Care Team: Dorothyann Peng, NP as PCP - General (Family Medicine) Dorothy Spark, MD as Consulting Physician (Cardiology) Burnell Blanks, MD as Consulting Physician (Cardiology) Evans Lance, MD as Consulting Physician (Clinical Cardiac Electrophysiology)   Assessment:   This is a routine wellness examination for D[place]awn. Physical assessment deferred to PCP.   Exercise Activities and Dietary recommendations   Diet (meal preparation, eat out,  water intake, caffeinated beverages, dairy products, fruits and vegetables): {Desc; diets:16563}       Goals   None     Fall Risk Fall Risk  08/04/2017 10/05/2016 06/19/2015 05/16/2014 05/03/2013  Falls in the past year? No No Yes No No  Number falls in past yr: - - 2 or more - -  Risk for fall due to : - - - - Impaired balance/gait    Depression Screen PHQ 2/9 Scores 08/04/2017 05/16/2014 05/03/2013  PHQ - 2 Score 0 0 0    Cognitive Function        Immunization History  Administered Date(s) Administered  . H1N1 09/10/2008  . Influenza Whole 05/26/2007, 05/17/2008, 05/15/2009  . Influenza, High Dose Seasonal PF 05/27/2015, 05/17/2018  . Influenza,inj,Quad PF,6+ Mos 05/03/2013, 05/16/2014  . Influenza-Unspecified 05/16/2016  . Pneumococcal Conjugate-13 05/16/2014  . Pneumococcal Polysaccharide-23 08/16/2005  . Td 08/16/2005, 05/27/2015  . Zoster 10/03/2008    Qualifies for Shingles Vaccine?   Screening Tests Health Maintenance  Topic Date Due  . TETANUS/TDAP  05/26/2025  . INFLUENZA VACCINE  Completed  . PNA vac Low Risk Adult  Completed         Plan:     I have personally reviewed and noted the following in the patient's chart:   . Medical and social history . Use of alcohol, tobacco or illicit drugs  . Current medications and supplements . Functional ability and status . Nutritional status . Physical activity . Advanced directives . List of other physicians . Vitals . Screenings to include cognitive, depression, and falls . Referrals and appointments  In addition, I have reviewed and discussed with patient certain preventive protocols, quality metrics, and best practice recommendations. A written personalized care plan for preventive services as well as general preventive health recommendations were provided to patient.     Alphia Moh, RN  09/20/2018

## 2018-09-21 ENCOUNTER — Ambulatory Visit: Payer: Medicare Other

## 2018-09-27 ENCOUNTER — Ambulatory Visit (INDEPENDENT_AMBULATORY_CARE_PROVIDER_SITE_OTHER): Payer: Medicare Other

## 2018-09-27 VITALS — BP 112/50 | HR 65 | Resp 16 | Wt 222.0 lb

## 2018-09-27 DIAGNOSIS — Z0000 Encounter for general adult medical examination without abnormal findings: Secondary | ICD-10-CM | POA: Diagnosis not present

## 2018-09-27 NOTE — Progress Notes (Signed)
Subjective:   Robert Anderson is a 85 y.o. male who presents for Medicare Annual/Subsequent preventive examination.  Review of Systems:  No ROS.  Medicare Wellness Visit. Additional risk factors are reflected in the social history.  Cardiac Risk Factors include: advanced age (>45men, >70 women);hypertension;obesity (BMI >30kg/m2);male gender;dyslipidemia Sleep patterns: feels rested on waking and gets up 6 times nightly to void, which is pt's baseline. States he can go right back to sleep however.   Home Safety/Smoke Alarms: Feels safe in home. Smoke alarms in place.  Living environment; residence and Firearm Safety: 1-story house/ trailer, equipment: Walkers, Type: Conservation officer, nature.  Seat Belt Safety/Bike Helmet: Wears seat belt.    Male:   CCS- N/A d/t age   PSA- has hx prostate radiation therapy Lab Results  Component Value Date   PSA 0.07 (L) 07/29/2016   PSA 0.07 (L) 05/20/2015   PSA 0.07 (L) 05/03/2013       Objective:    Vitals: BP (!) 112/50 (BP Location: Right Arm, Patient Position: Sitting, Cuff Size: Normal) Comment: pt to see cardiologist next month. Sees twice/year  Pulse 65   Resp 16   Wt 222 lb (100.7 kg)   BMI 32.78 kg/m   Body mass index is 32.78 kg/m.  Advanced Directives 09/27/2018 07/06/2015 06/25/2015 06/25/2015 06/23/2015 03/25/2015 03/21/2015  Does Patient Have a Medical Advance Directive? Yes Yes Yes Yes Yes No Yes  Type of Paramedic of [place];Living will Living will;Healthcare Power of Attorney Living will;Healthcare Power of Attorney - Living will;Healthcare Power of Attorney - -  Does patient want to make changes to medical advance directive? No - Patient declined No - Patient declined No - Patient declined - No - Patient declined - -  Copy of [place] in Chart? No - copy requested No - copy requested No - copy requested No - copy requested No - copy requested - -    Tobacco Social History   Tobacco Use    Smoking Status Never Smoker  Smokeless Tobacco Never Used     Counseling given: Not Answered  Past Medical History:  Diagnosis Date  . Atrial fibrillation ([place])    a. dx after lead revision 06/26/15 >> anticoag not started due to recent pericardial effusion in setting of perforation and lead revision  . Bradycardia    a. HR 30s in clinic 06/2015 - BB discontinued.  . DEGENERATIVE JOINT DISEASE, SPINE   . ERECTILE DYSFUNCTION   . Essential hypertension   . First degree AV block   . HEARING LOSS   . Heart block    s/p Pacemaker  . Hyperlipidemia   . Hypertrophic obstructive cardiomyopathy ([place])   . LBBB (left bundle branch block)   . OA (osteoarthritis)    both hips  . Pericardial effusion    a. micorpeforation s/p lead revision 06/26/15  . Presence of permanent cardiac pacemaker 06/24/2015   St Jude Assurity  . Prostate cancer [facility]) 2010   "external radiation; 40 treatments"  . 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 Severe aortic stenosis    a. s/p TAVR 03/2015. Pre-op  [facility] 02/2015: minor nonobstructive CAD.  Marland Kitchen Squamous cell skin cancer    left lower leg  . Stroke ([place]) 02/19/2014   Small infarct high posterior left frontal lobe on MRI   . TIA (transient ischemic attack) 01/21/2014   5  min spell aphasia without assoc sx     Past Surgical History:  Procedure Laterality Date  . ANTERIOR CERVICAL DECOMP/DISCECTOMY FUSION  ~ 2000   Dr. Louanne Skye  . BACK SURGERY    . CARDIAC CATHETERIZATION N/A 02/20/2015   Procedure: Right/Left Heart Cath and Coronary Angiography;  Surgeon: Sherren Mocha, MD;  Location: [place] [facility];  Service: Cardiovascular;  Laterality: N/A;  . CARDIAC VALVE REPLACEMENT    . CATARACT EXTRACTION W/ INTRAOCULAR LENS  IMPLANT, BILATERAL  2004; 2010   right; left  . EP IMPLANTABLE DEVICE N/A 06/24/2015   Procedure: Pacemaker Implant;  Surgeon:  Evans Lance, MD;  Location: [place] [facility];  Service: Cardiovascular; St Jude Assurity   . EP IMPLANTABLE DEVICE N/A 06/26/2015   Procedure: Lead Revision/Repair;  Surgeon: Will Meredith Leeds, MD;  Location: [place] [facility];  Service: Cardiovascular;  Laterality: N/A;  . INSERT / REPLACE / REMOVE PACEMAKER  06/24/2015  . JOINT REPLACEMENT    . SQUAMOUS CELL CARCINOMA EXCISION Left X 3   "lower leg"  . TEE WITHOUT CARDIOVERSION N/A 03/07/2015   Procedure: TRANSESOPHAGEAL ECHOCARDIOGRAM (TEE);  Surgeon: Dorothy Spark, MD;  Location: [place];  Service: Cardiovascular;  Laterality: N/A;  . TEE WITHOUT CARDIOVERSION N/A 03/25/2015   Procedure: TRANSESOPHAGEAL ECHOCARDIOGRAM (TEE);  Surgeon: Burnell Blanks, MD;  Location: [place];  Service: Open Heart Surgery;  Laterality: N/A;  . TONSILLECTOMY    . TOTAL SHOULDER ARTHROPLASTY Right 2007   "wore it out playing tennis"  . TRANSCATHETER AORTIC VALVE REPLACEMENT, TRANSFEMORAL N/A 03/25/2015   Procedure: TRANSCATHETER AORTIC VALVE REPLACEMENT, TRANSFEMORAL;  Surgeon: Burnell Blanks, MD;  Location: [place];  Service: Open Heart Surgery;  Laterality: N/A;   Family History  Problem Relation Age of Onset  . Heart disease Mother   . CVA Mother   . Stroke Mother   . CVA Father   . Cancer Father   . Brain cancer Sister   . Prostate cancer Brother   . Heart disease Brother   . Lung cancer Brother   . Heart attack Brother   . Hypertension Neg Hx    Social History   Socioeconomic History  . Marital status: Married    Spouse name: Not on file  . Number of children: 4  . Years of education: Not on file  . Highest education level: Not on file  Occupational History  . Occupation: Retired  Scientific laboratory technician  . Financial resource strain: Not hard at all  . Food insecurity:    Worry: Never true    Inability: Never true  . Transportation needs:    Medical: No    Non-medical: No  Tobacco Use  . Smoking status: Never Smoker    . Smokeless tobacco: Never Used  Substance and Sexual Activity  . Alcohol use: No    Alcohol/week: 0.0 standard drinks  . Drug use: No  . Sexual activity: Not Currently  Lifestyle  . Physical activity:    Days per week: 5 days    Minutes per session: 60 min  . Stress: Not at all  Relationships  . Social connections:    Talks on phone: More than three times a week    Gets together: More than three times a week    Attends religious service: Never    Active member of club or organization: Yes    Attends meetings of clubs or organizations: More than 4 times per year    Relationship status:  Married  Other Topics Concern  . Not on file  Social History Narrative   Patient lives in [place] with his wife. 2nd marriage    4 children    He [facility] three days a week      09/27/18: Lives at [place]   Still goes to [facility], 2X/week, bicycles/uses sitting eliptical in room daily   Enjoys singing, part of singing group at center   Active in discussion groups at center          Outpatient Encounter Medications as of 09/27/2018  Medication Sig  . acetaminophen (TYLENOL) 325 MG tablet Take 1-2 tablets (325-650 mg total) by mouth every 4 (four) hours as needed for mild pain.  Marland Kitchen docusate sodium (COLACE) 100 MG capsule Take 300 mg by mouth at bedtime.   . metoprolol tartrate (LOPRESSOR) 25 MG tablet TAKE 1/2 TABLET BY MOUTH TWICE A DAY  . Multiple Vitamins-Minerals (PRESERVISION AREDS 2+MULTI VIT PO) Take 1 tablet by mouth 2 (two) times daily.  . Omega-3 Fatty Acids (FISH OIL) 1000 MG CAPS Take 1,000 mg by mouth 2 (two) times daily.  . tamsulosin (FLOMAX) 0.4 MG CAPS capsule TAKE ONE CAPSULE BY MOUTH 3 TIMES A WEEK ON MONDAYS, WEDNESDAYS, AND FRIDAYS  . UNABLE TO FIND Med Name: PRO VISION  . warfarin (COUMADIN) 3 MG tablet Take 1 tablet daily as directed by coumadin clinic   No facility-administered encounter medications on file as of 09/27/2018.      Activities of Daily Living In your present state of health, do you have any difficulty performing the following activities: 09/27/2018  Hearing? Y  Vision? N  Difficulty concentrating or making decisions? N  Walking or climbing stairs? Y  Comment uses rolling walker  Dressing or bathing? N  Doing errands, shopping? N  Preparing Food and eating ? N  Using the Toilet? N  In the past six months, have you accidently leaked urine? N  Do you have problems with loss of bowel control? N  Managing your Medications? N  Managing your Finances? N  Housekeeping or managing your Housekeeping? N  Some recent data might be hidden    Patient Care Team: Dorothyann Peng, NP as PCP - General (Family Medicine) Dorothy Spark, MD as Consulting Physician (Cardiology) Burnell Blanks, MD as Consulting Physician (Cardiology) Evans Lance, MD as Consulting Physician (Clinical Cardiac Electrophysiology) Danella Sensing, MD as Consulting Physician (Dermatology) Desma Maxim, MD as Referring Physician (Ophthalmology)   Assessment:   This is a routine wellness examination for [place]. Physical assessment deferred to PCP.   Exercise Activities and Dietary recommendations Current Exercise Habits: Structured exercise class;Home exercise routine, Type of exercise: walking(elliptical), Time (Minutes): 30, Frequency (Times/Week): 5, Weekly Exercise (Minutes/Week): 150, Intensity: Moderate, Exercise limited by: cardiac condition(s);orthopedic condition(s) Diet (meal preparation, eat out, water intake, caffeinated beverages, dairy products, fruits and vegetables): on average, 3 meals per day, eats cereal for breakfast, sandwich for lunch, and whatever is for dinner at the center. States he does eat a good amount of sugar. Advised to cut down on desserts to help with triglycerides, prevent cardiac event. Pt. verbalized understanding.      Goals    . Patient Stated     Reduce simple sugar intake to  help with triglycerides Keep doing what you're doing!       Fall Risk Fall Risk  09/27/2018 08/04/2017 10/05/2016 06/19/2015 05/16/2014  Falls in the past year? 0 No No Yes No  Number falls in past yr: - - - 2 or more -  Risk for fall due to : - - - - -    Depression Screen PHQ 2/9 Scores 09/27/2018 08/04/2017 05/16/2014 05/03/2013  PHQ - 2 Score 0 0 0 0  PHQ- 9 Score 1 - - -    Cognitive Function        Ad8 score reviewed for issues:  Issues making decisions: no  Less interest in hobbies / activities: no  Repeats questions, stories (family complaining): no  Trouble using ordinary gadgets (microwave, computer, phone):no  Forgets the month or year: no  Mismanaging finances: no  Remembering appts: no  Daily problems with thinking and/or memory: no Ad8 score is= 0  Pt. Very sharp, no cognitive deficits noted or reported.  Immunization History  Administered Date(s) Administered  . H1N1 09/10/2008  . Influenza Whole 05/26/2007, 05/17/2008, 05/15/2009  . Influenza, High Dose Seasonal PF 05/27/2015, 05/17/2018  . Influenza,inj,Quad PF,6+ Mos 05/03/2013, 05/16/2014  . Influenza-Unspecified 05/16/2016  . Pneumococcal Conjugate-13 05/16/2014  . Pneumococcal Polysaccharide-23 08/16/2005  . Td 08/16/2005, 05/27/2015  . Zoster 10/03/2008    Screening Tests Health Maintenance  Topic Date Due  . TETANUS/TDAP  05/26/2025  . INFLUENZA VACCINE  Completed  . PNA vac Low Risk Adult  Completed       Plan:    Reduce simple sugars to help lower triglycerides.  Keep singing and biking!   Pt. counseled on nature of AWV from PCP office vs. Insurance company, and was reassured that it was completely voluntary, and pt. did not have to complete next year if he did not think it was needed. Pt. pleasantly declined follow-up, and was appreciative.  I have personally reviewed and noted the following in the patient's chart:   . Medical and social history . Use of alcohol, tobacco  or illicit drugs  . Current medications and supplements . Functional ability and status . Nutritional status . Physical activity . Advanced directives . List of other physicians . Vitals . Screenings to include cognitive, depression, and falls . Referrals and appointments  In addition, I have reviewed and discussed with patient certain preventive protocols, quality metrics, and best practice recommendations. A written personalized care plan for preventive services as well as general preventive health recommendations were provided to patient.     Alphia Moh, RN  09/27/2018

## 2018-09-27 NOTE — Patient Instructions (Addendum)
Keep singing and biking! It was great meeting you.  [name] , Thank you for taking time to come for your Medicare Wellness Visit. I appreciate your ongoing commitment to your health goals. Please review the following plan we discussed and let me know if I can assist you in the future.   These are the goals we discussed: Goals    . Patient Stated     Reduce simple sugar intake to help with triglycerides Keep doing what you're doing!       This is a list of the screening recommended for you and due dates:  Health Maintenance  Topic Date Due  . Tetanus Vaccine  05/26/2025  . Flu Shot  Completed  . Pneumonia vaccines  Completed     Fat and Cholesterol Restricted Eating Plan Eating a diet that limits fat and cholesterol may help lower your risk for heart disease and other conditions. Your body needs fat and cholesterol for basic functions, but eating too much of these things can be harmful to your health. Your health care provider may order lab tests to check your blood fat (lipid) and cholesterol levels. This helps your health care provider understand your risk for certain conditions and whether you need to make diet changes. Work with your health care provider or dietitian to make an eating plan that is right for you. Your plan includes:  Limit your fat intake to ______% or less of your total calories a day.  Limit your saturated fat intake to ______% or less of your total calories a day.  Limit the amount of cholesterol in your diet to less than _________mg a day.  Eat ___________ g of fiber a day. What are tips for following this plan? General guidelines   If you are overweight, work with your health care provider to lose weight safely. Losing just 5-10% of your body weight can improve your overall health and help prevent diseases such as diabetes and heart disease.  Avoid: ? Foods with added sugar. ? Fried foods. ? Foods that contain partially hydrogenated oils, including  stick margarine, some tub margarines, cookies, crackers, and other baked goods.  Limit alcohol intake to no more than 1 drink a day for nonpregnant women and 2 drinks a day for men. One drink equals 12 oz of beer, 5 oz of wine, or 1 oz of hard liquor. Reading food labels  Check food labels for: ? Trans fats, partially hydrogenated oils, or high amounts of saturated fat. Avoid foods that contain saturated fat and trans fat. ? The amount of cholesterol in each serving. Try to eat no more than 200 mg of cholesterol each day. ? The amount of fiber in each serving. Try to eat at least 20-30 g of fiber each day.  Choose foods with healthy fats, such as: ? Monounsaturated and polyunsaturated fats. These include olive and canola oil, flaxseeds, walnuts, almonds, and seeds. ? Omega-3 fats. These are found in foods such as salmon, mackerel, sardines, tuna, flaxseed oil, and ground flaxseeds.  Choose grain products that have whole grains. Look for the word "whole" as the first word in the ingredient list. Cooking  Cook foods using methods other than frying. Baking, boiling, grilling, and broiling are some healthy options.  Eat more home-cooked food and less restaurant, buffet, and fast food.  Avoid cooking using saturated fats. ? Animal sources of saturated fats include meats, butter, and cream. ? Plant sources of saturated fats include palm oil, palm kernel oil, and  coconut oil. Meal planning   At meals, imagine dividing your plate into fourths: ? Fill one-half of your plate with vegetables and green salads. ? Fill one-fourth of your plate with whole grains. ? Fill one-fourth of your plate with lean protein foods.  Eat fish that is high in omega-3 fats at least two times a week.  Eat more foods that contain fiber, such as whole grains, beans, apples, broccoli, carrots, peas, and barley. These foods help promote healthy cholesterol levels in the blood. Recommended foods Grains  Whole  grains, such as whole wheat or whole grain breads, crackers, cereals, and pasta. Unsweetened oatmeal, bulgur, barley, quinoa, or brown rice. Corn or whole wheat flour tortillas. Vegetables  Fresh or frozen vegetables (raw, steamed, roasted, or grilled). Green salads. Fruits  All fresh, canned (in natural juice), or frozen fruits. Meats and other protein foods  Ground beef (85% or leaner), grass-fed beef, or beef trimmed of fat. Skinless chicken or [place]. Ground chicken or [place]. Pork trimmed of fat. All fish and seafood. Egg whites. Dried beans, peas, or lentils. Unsalted nuts or seeds. Unsalted canned beans. Natural nut butters without added sugar and oil. Dairy  Low-fat or nonfat dairy products, such as skim or 1% milk, 2% or reduced-fat cheeses, low-fat and fat-free ricotta or cottage cheese, or plain low-fat and nonfat yogurt. Fats and oils  Tub margarine without trans fats. Light or reduced-fat mayonnaise and salad dressings. Avocado. Olive, canola, sesame, or safflower oils. The items listed above may not be a complete list of recommended foods or beverages. Contact your dietitian for more options. Foods to avoid Grains  White bread. White pasta. White rice. Cornbread. Bagels, pastries, and croissants. Crackers and snack foods that contain trans fat and hydrogenated oils. Vegetables  Vegetables cooked in cheese, cream, or butter sauce. Fried vegetables. Fruits  Canned fruit in heavy syrup. Fruit in cream or butter sauce. Fried fruit. Meats and other protein foods  Fatty cuts of meat. Ribs, chicken wings, bacon, sausage, bologna, salami, chitterlings, fatback, hot dogs, bratwurst, and packaged lunch meats. Liver and organ meats. Whole eggs and egg yolks. Chicken and [place] with skin. Fried meat. Dairy  Whole or 2% milk, cream, half-and-half, and cream cheese. Whole milk cheeses. Whole-fat or sweetened yogurt. Full-fat cheeses. Nondairy creamers and whipped toppings. Processed  cheese, cheese spreads, and cheese curds. Beverages  Alcohol. Sugar-sweetened drinks such as sodas, lemonade, and fruit drinks. Fats and oils  Butter, stick margarine, lard, shortening, ghee, or bacon fat. Coconut, palm kernel, and palm oils. Sweets and desserts  Corn syrup, sugars, honey, and molasses. Candy. Jam and jelly. Syrup. Sweetened cereals. Cookies, pies, cakes, donuts, muffins, and ice cream. The items listed above may not be a complete list of foods and beverages to avoid. Contact your dietitian for more information. Summary  Your body needs fat and cholesterol for basic functions. However, eating too much of these things can be harmful to your health.  Work with your health care provider and dietitian to follow a diet low in fat and cholesterol. Doing this may help lower your risk for heart disease and other conditions.  Choose healthy fats, such as monounsaturated and polyunsaturated fats, and foods high in omega-3 fatty acids.  Eat fiber-rich foods, such as whole grains, beans, peas, fruits, and vegetables.  Limit or avoid alcohol, fried foods, and foods high in saturated fats, partially hydrogenated oils, and sugar. This information is not intended to replace advice given to you by your health care provider.  Make sure you discuss any questions you have with your health care provider. Document Released: 08/02/2005 Document Revised: 04/19/2017 Document Reviewed: 04/19/2017 Elsevier Interactive Patient Education  2019 [place] Maintenance, Male A healthy lifestyle and preventive care is important for your health and wellness. Ask your health care provider about what schedule of regular examinations is right for you. What should I know about weight and diet? Eat a Healthy Diet  Eat plenty of vegetables, fruits, whole grains, low-fat dairy products, and lean protein.  Do not eat a lot of foods high in solid fats, added sugars, or salt.  Maintain a Healthy  Weight Regular exercise can help you achieve or maintain a healthy weight. You should:  Do at least 150 minutes of exercise each week. The exercise should increase your heart rate and make you sweat (moderate-intensity exercise).  Do strength-training exercises at least twice a week. Watch Your Levels of Cholesterol and Blood Lipids  Have your blood tested for lipids and cholesterol every 5 years starting at 83 years of age. If you are at high risk for heart disease, you should start having your blood tested when you are 83 years old. You may need to have your cholesterol levels checked more often if: ? Your lipid or cholesterol levels are high. ? You are older than 83 years of age. ? You are at high risk for heart disease. What should I know about cancer screening? Many types of cancers can be detected early and may often be prevented. Lung Cancer  You should be screened every year for lung cancer if: ? You are a current smoker who has smoked for at least 30 years. ? You are a former smoker who has quit within the past 15 years.  Talk to your health care provider about your screening options, when you should start screening, and how often you should be screened. Colorectal Cancer  Routine colorectal cancer screening usually begins at 83 years of age and should be repeated every 5-10 years until you are 83 years old. You may need to be screened more often if early forms of precancerous polyps or small growths are found. Your health care provider may recommend screening at an earlier age if you have risk factors for colon cancer.  Your health care provider may recommend using home test kits to check for hidden blood in the stool.  A small camera at the end of a tube can be used to examine your colon (sigmoidoscopy or colonoscopy). This checks for the earliest forms of colorectal cancer. Prostate and Testicular Cancer  Depending on your age and overall health, your health care provider  may do certain tests to screen for prostate and testicular cancer.  Talk to your health care provider about any symptoms or concerns you have about testicular or prostate cancer. Skin Cancer  Check your skin from head to toe regularly.  Tell your health care provider about any new moles or changes in moles, especially if: ? There is a change in a mole's size, shape, or color. ? You have a mole that is larger than a pencil eraser.  Always use sunscreen. Apply sunscreen liberally and repeat throughout the day.  Protect yourself by wearing long sleeves, pants, a wide-brimmed hat, and sunglasses when outside. What should I know about heart disease, diabetes, and high blood pressure?  If you are 104-35 years of age, have your blood pressure checked every 3-5 years. If you are 8 years of age or  older, have your blood pressure checked every year. You should have your blood pressure measured twice-once when you are at a hospital or clinic, and once when you are not at a hospital or clinic. Record the average of the two measurements. To check your blood pressure when you are not at a hospital or clinic, you can use: ? An automated blood pressure machine at a pharmacy. ? A home blood pressure monitor.  Talk to your health care provider about your target blood pressure.  If you are between 74-25 years old, ask your health care provider if you should take aspirin to prevent heart disease.  Have regular diabetes screenings by checking your fasting blood sugar level. ? If you are at a normal weight and have a low risk for diabetes, have this test once every three years after the age of 35. ? If you are overweight and have a high risk for diabetes, consider being tested at a younger age or more often.  A one-time screening for abdominal aortic aneurysm (AAA) by ultrasound is recommended for men aged 70-75 years who are current or former smokers. What should I know about preventing infection? Hepatitis  B If you have a higher risk for hepatitis B, you should be screened for this virus. Talk with your health care provider to find out if you are at risk for hepatitis B infection. Hepatitis C Blood testing is recommended for:  Everyone born from [birth year] through 1963.  Anyone with known risk factors for hepatitis C. Sexually Transmitted Diseases (STDs)  You should be screened each year for STDs including gonorrhea and chlamydia if: ? You are sexually active and are younger than 83 years of age. ? You are older than 83 years of age and your health care provider tells you that you are at risk for this type of infection. ? Your sexual activity has changed since you were last screened and you are at an increased risk for chlamydia or gonorrhea. Ask your health care provider if you are at risk.  Talk with your health care provider about whether you are at high risk of being infected with HIV. Your health care provider may recommend a prescription medicine to help prevent HIV infection. What else can I do?  Schedule regular health, dental, and eye exams.  Stay current with your vaccines (immunizations).  Do not use any tobacco products, such as cigarettes, chewing tobacco, and e-cigarettes. If you need help quitting, ask your health care provider.  Limit alcohol intake to no more than 2 drinks per day. One drink equals 12 ounces of beer, 5 ounces of wine, or 1 ounces of hard liquor.  Do not use street drugs.  Do not share needles.  Ask your health care provider for help if you need support or information about quitting drugs.  Tell your health care provider if you often feel depressed.  Tell your health care provider if you have ever been abused or do not feel safe at home. This information is not intended to replace advice given to you by your health care provider. Make sure you discuss any questions you have with your health care provider. Document Released: 01/29/2008 Document Revised:  03/31/2016 Document Reviewed: 05/06/2015 Elsevier Interactive Patient Education  2019 [employer].

## 2018-09-28 DIAGNOSIS — H353112 Nonexudative age-related macular degeneration, right eye, intermediate dry stage: Secondary | ICD-10-CM | POA: Diagnosis not present

## 2018-09-28 DIAGNOSIS — H43813 Vitreous degeneration, bilateral: Secondary | ICD-10-CM | POA: Diagnosis not present

## 2018-09-28 DIAGNOSIS — H353221 Exudative age-related macular degeneration, left eye, with active choroidal neovascularization: Secondary | ICD-10-CM | POA: Diagnosis not present

## 2018-09-28 DIAGNOSIS — H35033 Hypertensive retinopathy, bilateral: Secondary | ICD-10-CM | POA: Diagnosis not present

## 2018-09-28 NOTE — Progress Notes (Signed)
Remote pacemaker transmission.   

## 2018-10-09 ENCOUNTER — Telehealth: Payer: Self-pay | Admitting: Adult Health

## 2018-10-09 ENCOUNTER — Other Ambulatory Visit: Payer: Self-pay | Admitting: Adult Health

## 2018-10-09 ENCOUNTER — Other Ambulatory Visit: Payer: Medicare Other

## 2018-10-09 NOTE — Telephone Encounter (Signed)
Patient needs a refill on Tamsulosin HCL 0.4 MG Capsule.  Patient takes last pill today.

## 2018-10-10 NOTE — Telephone Encounter (Signed)
Sent to the pharmacy by e-scribe. 

## 2018-10-16 ENCOUNTER — Ambulatory Visit: Payer: Medicare Other | Admitting: Cardiology

## 2018-10-16 ENCOUNTER — Other Ambulatory Visit: Payer: Self-pay | Admitting: *Deleted

## 2018-10-16 ENCOUNTER — Encounter: Payer: Self-pay | Admitting: Cardiology

## 2018-10-16 ENCOUNTER — Other Ambulatory Visit: Payer: Medicare Other | Admitting: *Deleted

## 2018-10-16 VITALS — BP 122/62 | HR 61 | Ht 69.0 in | Wt 222.8 lb

## 2018-10-16 DIAGNOSIS — Z952 Presence of prosthetic heart valve: Secondary | ICD-10-CM

## 2018-10-16 DIAGNOSIS — Z5181 Encounter for therapeutic drug level monitoring: Secondary | ICD-10-CM | POA: Diagnosis not present

## 2018-10-16 DIAGNOSIS — I4891 Unspecified atrial fibrillation: Secondary | ICD-10-CM | POA: Diagnosis not present

## 2018-10-16 DIAGNOSIS — I421 Obstructive hypertrophic cardiomyopathy: Secondary | ICD-10-CM

## 2018-10-16 DIAGNOSIS — I10 Essential (primary) hypertension: Secondary | ICD-10-CM | POA: Diagnosis not present

## 2018-10-16 MED ORDER — METOPROLOL SUCCINATE ER 25 MG PO TB24: 25.0000 mg | ORAL_TABLET | Freq: Every day | ORAL | 2 refills | Status: DC

## 2018-10-16 NOTE — Patient Instructions (Signed)
Medication Instructions:   STOP TAKING METOPROLOL TARTRATE NOW  START TAKING METOPROLOL SUCCINATE (TOPROL XL) 25 MG ONCE DAILY  If you need a refill on your cardiac medications before your next appointment, please call your pharmacy.       Follow-Up: At [facility], you and your health needs are our priority.  As part of our continuing mission to provide you with exceptional heart care, we have created designated Provider Care Teams.  These Care Teams include your primary Cardiologist (physician) and Advanced Practice Providers (APPs -  Physician Assistants and Nurse Practitioners) who all work together to provide you with the care you need, when you need it. You will need a follow up appointment in 6 months Dr. Meda Coffee.  Please call our office 2 months in advance to schedule this appointment.  You may see  or one of the following Advanced Practice Providers on your designated Care Team:   Lyda Jester, PA-C Melina Copa, PA-C . Ermalinda Barrios, PA-C

## 2018-10-16 NOTE — Progress Notes (Signed)
10/16/2018 [name]   1923-11-13  [id number]  Primary Physician Dorothyann Peng, NP Primary Cardiologist: Dr. Meda Coffee Electrophysiologist: Dr. Lovena Le  Reason for Visit/CC: 3 month f/u for HOCM, Aortic Valve Disease and PAF  HPI:  Robert Anderson is a  85 y.o. male who presents today for 3 month follow up visit.  He has a history of severe AS s/p TAVR 03/25/15, HOCM, LBBB, 1st degree AVB, essential HTN, h/o TIA & CVA, PAF now on chronic coumadin, followed in coumadin clinic. He also has a PPM, implanted for symptomatic 2:1 heart block following his TAVR. This is followed by Dr. Lovena Le.  LHC 02/2015: minor nonobstructive CAD. 2D echo 04/25/15: vigorous LV systolic function with near-cavity obliteration, moderate LVH, EF 65-70%, grade 1 DD, mod LAE. His primary cardiologist is Dr. Meda Coffee. Last OV with her was 11/15/16. He was stable at that time and denied any symptoms. He was instructed to f/u in 3-4 months. Of note, he has also had some issues with mild orthostatic hypotension. He has been able to tolerate low doses of metoprolol, which he needs for his HOCM.   12/01/16 - He presents back today for f/u. He reports he has done well. No CP or dyspnea. No palpitations, dizziness, syncope/ near syncope. He gets around well for his age. He works out every day 30-45 min at a time, at home and at [employer], on the stationary bike and seated elliptical. No exertional symptoms. He denies abnormal bleeding with coumadin. No falls. He is extra cautious with positional changes. He denies any recent symptoms of orthostatic hypotension. BP is well controlled in clinic today at 130/63.  He and his wife and currently transitioning from their home to [place]. He is happy about this.   10/16/2018 -the patient is doing great he continues to exercise 30 minutes a day, he denies any symptoms at all, he has no problems with balance no palpitation dizziness or syncope.  No lower extremity edema no chest pain orthopnea or  proximal nocturnal dyspnea.  Current Meds  Medication Sig  . acetaminophen (TYLENOL) 325 MG tablet Take 1-2 tablets (325-650 mg total) by mouth every 4 (four) hours as needed for mild pain.  Marland Kitchen amoxicillin (AMOXIL) 500 MG capsule TAKE 4 CAPSULES BY MOUTH 1 HOUR BEFORE APPT  . docusate sodium (COLACE) 100 MG capsule Take 300 mg by mouth at bedtime.   . Multiple Vitamins-Minerals (PRESERVISION AREDS 2+MULTI VIT PO) Take 1 tablet by mouth 2 (two) times daily.  . Omega-3 Fatty Acids (FISH OIL) 1000 MG CAPS Take 1,000 mg by mouth 2 (two) times daily.  Marland Kitchen PREVIDENT 5000 SENSITIVE 1.1-5 % PSTE BRUSH AS DIRECTED  . tamsulosin (FLOMAX) 0.4 MG CAPS capsule TAKE ONE CAPSULE BY MOUTH 3 TIMES A WEEK ON MONDAYS, WEDNESDAYS, AND FRIDAYS  . UNABLE TO FIND Med Name: PRO VISION  . warfarin (COUMADIN) 3 MG tablet Take 1 tablet daily as directed by coumadin clinic  . [DISCONTINUED] metoprolol tartrate (LOPRESSOR) 25 MG tablet TAKE 1/2 TABLET BY MOUTH TWICE A DAY   No Known Allergies Past Medical History:  Diagnosis Date  . Atrial fibrillation ([place])    a. dx after lead revision 06/26/15 >> anticoag not started due to recent pericardial effusion in setting of perforation and lead revision  . Bradycardia    a. HR 30s in clinic 06/2015 - BB discontinued.  . DEGENERATIVE JOINT DISEASE, SPINE   . ERECTILE DYSFUNCTION   . Essential hypertension   .  First degree AV block   . HEARING LOSS   . Heart block    s/p Pacemaker  . Hyperlipidemia   . Hypertrophic obstructive cardiomyopathy ([place])   . LBBB (left bundle branch block)   . OA (osteoarthritis)    both hips  . Pericardial effusion    a. micorpeforation s/p lead revision 06/26/15  . Presence of permanent cardiac pacemaker 06/24/2015   St Jude Assurity  . Prostate cancer [facility]) 2010   "external radiation; 40 treatments"  . S/P TAVR (transcatheter aortic valve replacement) 03/25/2015   29 mm Edwards Sapien 3 transcatheter heart valve placed via open right  transfemoral approach  . Severe aortic stenosis    a. s/p TAVR 03/2015. Pre-op  LHC 02/2015: minor nonobstructive CAD.  Marland Kitchen Squamous cell skin cancer    left lower leg  . Stroke ([place]) 02/19/2014   Small infarct high posterior left frontal lobe on MRI   . TIA (transient ischemic attack) 01/21/2014   5 min spell aphasia without assoc sx     Family History  Problem Relation Age of Onset  . Heart disease Mother   . CVA Mother   . Stroke Mother   . CVA Father   . Cancer Father   . Brain cancer Sister   . Prostate cancer Brother   . Heart disease Brother   . Lung cancer Brother   . Heart attack Brother   . Hypertension Neg Hx    Past Surgical History:  Procedure Laterality Date  . ANTERIOR CERVICAL DECOMP/DISCECTOMY FUSION  ~ 2000   Dr. Louanne Skye  . BACK SURGERY    . CARDIAC CATHETERIZATION N/A 02/20/2015   Procedure: Right/Left Heart Cath and Coronary Angiography;  Surgeon: Sherren Mocha, MD;  Location: [place] [facility];  Service: Cardiovascular;  Laterality: N/A;  . CARDIAC VALVE REPLACEMENT    . CATARACT EXTRACTION W/ INTRAOCULAR LENS  IMPLANT, BILATERAL  2004; 2010   right; left  . EP IMPLANTABLE DEVICE N/A 06/24/2015   Procedure: Pacemaker Implant;  Surgeon: Evans Lance, MD;  Location: [place] [facility];  Service: Cardiovascular; St Jude Assurity   . EP IMPLANTABLE DEVICE N/A 06/26/2015   Procedure: Lead Revision/Repair;  Surgeon: Will Meredith Leeds, MD;  Location: [place] [facility];  Service: Cardiovascular;  Laterality: N/A;  . INSERT / REPLACE / REMOVE PACEMAKER  06/24/2015  . JOINT REPLACEMENT    . SQUAMOUS CELL CARCINOMA EXCISION Left X 3   "lower leg"  . TEE WITHOUT CARDIOVERSION N/A 03/07/2015   Procedure: TRANSESOPHAGEAL ECHOCARDIOGRAM (TEE);  Surgeon: Dorothy Spark, MD;  Location: [place];  Service: Cardiovascular;  Laterality: N/A;  . TEE WITHOUT CARDIOVERSION N/A 03/25/2015   Procedure: TRANSESOPHAGEAL ECHOCARDIOGRAM (TEE);  Surgeon: Burnell Blanks, MD;   Location: [place];  Service: Open Heart Surgery;  Laterality: N/A;  . TONSILLECTOMY    . TOTAL SHOULDER ARTHROPLASTY Right 2007   "wore it out playing tennis"  . TRANSCATHETER AORTIC VALVE REPLACEMENT, TRANSFEMORAL N/A 03/25/2015   Procedure: TRANSCATHETER AORTIC VALVE REPLACEMENT, TRANSFEMORAL;  Surgeon: Burnell Blanks, MD;  Location: [place];  Service: Open Heart Surgery;  Laterality: N/A;   Social History   Socioeconomic History  . Marital status: Married    Spouse name: Not on file  . Number of children: 4  . Years of education: Not on file  . Highest education level: Not on file  Occupational History  . Occupation: Retired  Scientific laboratory technician  . Financial resource strain: Not hard at all  .  Food insecurity:    Worry: Never true    Inability: Never true  . Transportation needs:    Medical: No    Non-medical: No  Tobacco Use  . Smoking status: Never Smoker  . Smokeless tobacco: Never Used  Substance and Sexual Activity  . Alcohol use: No    Alcohol/week: 0.0 standard drinks  . Drug use: No  . Sexual activity: Not Currently  Lifestyle  . Physical activity:    Days per week: 5 days    Minutes per session: 60 min  . Stress: Not at all  Relationships  . Social connections:    Talks on phone: More than three times a week    Gets together: More than three times a week    Attends religious service: Never    Active member of club or organization: Yes    Attends meetings of clubs or organizations: More than 4 times per year    Relationship status: Married  . Intimate partner violence:    Fear of current or ex partner: No    Emotionally abused: No    Physically abused: No    Forced sexual activity: No  Other Topics Concern  . Not on file  Social History Narrative   Patient lives in [place] with his wife. 2nd marriage    4 children    He goes to the [facility] three days a week      09/27/18: Lives at [place] with [place]   Still goes to [facility], 2X/week,  bicycles/uses sitting eliptical in room daily   Enjoys singing, part of singing group at center   Active in discussion groups at center          Review of Systems: General: negative for chills, fever, night sweats or weight changes.  Cardiovascular: negative for chest pain, dyspnea on exertion, edema, orthopnea, palpitations, paroxysmal nocturnal dyspnea or shortness of breath Dermatological: negative for rash Respiratory: negative for cough or wheezing Urologic: negative for hematuria Abdominal: negative for nausea, vomiting, diarrhea, bright red blood per rectum, melena, or hematemesis Neurologic: negative for visual changes, syncope, or dizziness All other systems reviewed and are otherwise negative except as noted above.  Physical Exam:  Blood pressure 122/62, pulse 61, height 5\' 9"  (1.753 m), weight 222 lb 12.8 oz (101.1 kg), SpO2 93 %.  General appearance: alert, cooperative and no distress Neck: no carotid bruit and no JVD Lungs: clear to auscultation bilaterally Heart: regular rate and rhythm, S1, S2 normal, no murmur, click, rub or gallop Extremities: extremities normal, atraumatic, no cyanosis or edema Pulses: 2+ and symmetric Skin: Skin color, texture, turgor normal. No rashes or lesions Neurologic: Grossly normal  EKG AV sequential pacing -- personally reviewed     ASSESSMENT AND PLAN:   1. Aortic Valve Disease: s/p TAVR in 2016. His most recent 2D echo 03/2016 showed normal transaortic gradients with mean of 11 mmHg.  He has no symptoms and no murmur, no need to repeat echo right now.  2. HOCM: on BB therapy with metoprolol. He has a PPM.  He is asymptomatic, I will change his Lopressor to Toprol-XL as it is easier for him to take once a day dose.  3. PPM: implanted for 2:1 symptomatic heart block. Followed by Dr. Lovena Le. St Jude device.  Functioning well at the last checkup.  4. PAF: Rate is controlled with metoprolol. He is on chronic anticoagulation with  Coumadin. INRs are followed in our coumadin clinic.  No bleeding.  5. HTN: controlled  on current regimen.   6. HLD: continue conservative medical management with fish oil, given only minimal CAD noted on [facility] in 2016 as well as his advanced age. He has h/o statin intolerance with pravastatin in the past.   Follow-Up w/ Dr. Meda Coffee in 6 months.   Ena Dawley, MD [facility] 10/16/2018 3:59 PM

## 2018-10-17 LAB — COMPREHENSIVE METABOLIC PANEL
ALT: 13 IU/L (ref 0–44)
AST: 17 IU/L (ref 0–40)
Albumin/Globulin Ratio: 1.7 (ref 1.2–2.2)
Albumin: 3.8 g/dL (ref 3.5–4.6)
Alkaline Phosphatase: 69 IU/L (ref 39–117)
BUN/Creatinine Ratio: 18 (ref 10–24)
BUN: 18 mg/dL (ref 10–36)
Bilirubin Total: 0.3 mg/dL (ref 0.0–1.2)
CO2: 25 mmol/L (ref 20–29)
Calcium: 9.4 mg/dL (ref 8.6–10.2)
Chloride: 106 mmol/L (ref 96–106)
Creatinine, Ser: 0.98 mg/dL (ref 0.76–1.27)
GFR calc Af Amer: 77 mL/min/{1.73_m2} (ref >59)
GFR calc non Af Amer: 67 mL/min/{1.73_m2} (ref >59)
Globulin, Total: 2.3 g/dL (ref 1.5–4.5)
Glucose: 84 mg/dL (ref 65–99)
Potassium: 4.4 mmol/L (ref 3.5–5.2)
Sodium: 142 mmol/L (ref 134–144)
Total Protein: 6.1 g/dL (ref 6.0–8.5)

## 2018-10-17 LAB — CBC WITH DIFFERENTIAL/PLATELET
Basophils Absolute: 0.1 10*3/uL (ref 0.0–0.2)
Basos: 1 %
EOS (ABSOLUTE): 0.1 10*3/uL (ref 0.0–0.4)
Eos: 2 %
Hematocrit: 45 % (ref 37.5–51.0)
Hemoglobin: 15 g/dL (ref 13.0–17.7)
Immature Grans (Abs): 0 10*3/uL (ref 0.0–0.1)
Immature Granulocytes: 0 %
Lymphocytes Absolute: 2 10*3/uL (ref 0.7–3.1)
Lymphs: 32 %
MCH: 29.7 pg (ref 26.6–33.0)
MCHC: 33.3 g/dL (ref 31.5–35.7)
MCV: 89 fL (ref 79–97)
Monocytes Absolute: 0.6 10*3/uL (ref 0.1–0.9)
Monocytes: 9 %
Neutrophils Absolute: 3.4 10*3/uL (ref 1.4–7.0)
Neutrophils: 56 %
Platelets: 209 10*3/uL (ref 150–450)
RBC: 5.05 x10E6/uL (ref 4.14–5.80)
RDW: 12.9 % (ref 11.6–15.4)
WBC: 6.2 10*3/uL (ref 3.4–10.8)

## 2018-10-17 LAB — TSH: TSH: 3.08 u[IU]/mL (ref 0.450–4.500)

## 2018-10-18 ENCOUNTER — Ambulatory Visit (INDEPENDENT_AMBULATORY_CARE_PROVIDER_SITE_OTHER): Payer: Medicare Other | Admitting: Pharmacist Clinician (PhC)/ Clinical Pharmacy Specialist

## 2018-10-18 DIAGNOSIS — Z5181 Encounter for therapeutic drug level monitoring: Secondary | ICD-10-CM | POA: Diagnosis not present

## 2018-10-18 DIAGNOSIS — Z952 Presence of prosthetic heart valve: Secondary | ICD-10-CM | POA: Diagnosis not present

## 2018-10-18 DIAGNOSIS — I4891 Unspecified atrial fibrillation: Secondary | ICD-10-CM

## 2018-10-18 LAB — POCT INR: INR: 2.8 (ref 2.0–3.0)

## 2018-11-15 ENCOUNTER — Telehealth: Payer: Self-pay | Admitting: Cardiology

## 2018-11-15 NOTE — Telephone Encounter (Signed)
New Message   Patient want's to know if it would be ok to post pone appointment on 4/15.

## 2018-11-15 NOTE — Telephone Encounter (Signed)
Spoke with wife, pt to do drive thru Thurs April 9.

## 2018-11-22 ENCOUNTER — Telehealth: Payer: Self-pay

## 2018-11-22 NOTE — Telephone Encounter (Signed)

## 2018-11-22 NOTE — Telephone Encounter (Signed)
lmom for prescreen/drive up 

## 2018-11-23 ENCOUNTER — Ambulatory Visit (INDEPENDENT_AMBULATORY_CARE_PROVIDER_SITE_OTHER): Payer: Medicare Other | Admitting: *Deleted

## 2018-11-23 ENCOUNTER — Other Ambulatory Visit: Payer: Self-pay

## 2018-11-23 DIAGNOSIS — I4891 Unspecified atrial fibrillation: Secondary | ICD-10-CM

## 2018-11-23 DIAGNOSIS — Z952 Presence of prosthetic heart valve: Secondary | ICD-10-CM

## 2018-11-23 DIAGNOSIS — Z5181 Encounter for therapeutic drug level monitoring: Secondary | ICD-10-CM

## 2018-11-23 LAB — POCT INR: INR: 3.7 — AB (ref 2.0–3.0)

## 2018-11-23 NOTE — Patient Instructions (Signed)
Description   Spoke with pt and instructed pt to hold today's dose then continue taking the same dosage 1 tablet (3mg ) daily.  Recheck INR in 4 weeks. Coumadin Clinic [phone]

## 2018-11-25 ENCOUNTER — Other Ambulatory Visit: Payer: Self-pay | Admitting: Cardiology

## 2018-12-02 ENCOUNTER — Other Ambulatory Visit: Payer: Self-pay | Admitting: Cardiology

## 2018-12-19 ENCOUNTER — Telehealth: Payer: Self-pay

## 2018-12-19 NOTE — Telephone Encounter (Signed)

## 2018-12-21 ENCOUNTER — Ambulatory Visit (INDEPENDENT_AMBULATORY_CARE_PROVIDER_SITE_OTHER): Payer: Medicare Other

## 2018-12-21 ENCOUNTER — Other Ambulatory Visit: Payer: Self-pay

## 2018-12-21 DIAGNOSIS — Z5181 Encounter for therapeutic drug level monitoring: Secondary | ICD-10-CM

## 2018-12-21 DIAGNOSIS — I4891 Unspecified atrial fibrillation: Secondary | ICD-10-CM

## 2018-12-21 DIAGNOSIS — Z952 Presence of prosthetic heart valve: Secondary | ICD-10-CM | POA: Diagnosis not present

## 2018-12-21 LAB — POCT INR: INR: 3.6 — AB (ref 2.0–3.0)

## 2018-12-21 NOTE — Patient Instructions (Addendum)
Description   Spoke with pt and wife and instructed them to hold today's dose then change dose to 1 tablet (3mg ) daily, except for a half a tablet (1.5mg ) on Mondays.  Recheck INR in 2 weeks. Coumadin Clinic [phone]

## 2018-12-26 ENCOUNTER — Ambulatory Visit (INDEPENDENT_AMBULATORY_CARE_PROVIDER_SITE_OTHER): Payer: Medicare Other | Admitting: *Deleted

## 2018-12-26 ENCOUNTER — Other Ambulatory Visit: Payer: Self-pay

## 2018-12-26 DIAGNOSIS — I4891 Unspecified atrial fibrillation: Secondary | ICD-10-CM | POA: Diagnosis not present

## 2018-12-26 DIAGNOSIS — I442 Atrioventricular block, complete: Secondary | ICD-10-CM

## 2018-12-27 LAB — CUP PACEART REMOTE DEVICE CHECK
Date Time Interrogation Session: 20200513090212
Implantable Lead Implant Date: 20161108
Implantable Lead Implant Date: 20161108
Implantable Lead Location: 753859
Implantable Lead Location: 753860
Implantable Pulse Generator Implant Date: 20161108
Pulse Gen Model: 2240
Pulse Gen Serial Number: 7825693

## 2019-01-03 ENCOUNTER — Telehealth: Payer: Self-pay

## 2019-01-03 NOTE — Telephone Encounter (Signed)

## 2019-01-04 ENCOUNTER — Other Ambulatory Visit: Payer: Self-pay

## 2019-01-04 ENCOUNTER — Ambulatory Visit (INDEPENDENT_AMBULATORY_CARE_PROVIDER_SITE_OTHER): Payer: Medicare Other | Admitting: Pharmacist

## 2019-01-04 DIAGNOSIS — Z952 Presence of prosthetic heart valve: Secondary | ICD-10-CM | POA: Diagnosis not present

## 2019-01-04 DIAGNOSIS — Z5181 Encounter for therapeutic drug level monitoring: Secondary | ICD-10-CM | POA: Diagnosis not present

## 2019-01-04 LAB — POCT INR: INR: 2 (ref 2.0–3.0)

## 2019-01-10 NOTE — Progress Notes (Signed)
Remote pacemaker transmission.   

## 2019-01-22 DIAGNOSIS — Z961 Presence of intraocular lens: Secondary | ICD-10-CM | POA: Diagnosis not present

## 2019-01-22 DIAGNOSIS — H52203 Unspecified astigmatism, bilateral: Secondary | ICD-10-CM | POA: Diagnosis not present

## 2019-01-22 DIAGNOSIS — H353221 Exudative age-related macular degeneration, left eye, with active choroidal neovascularization: Secondary | ICD-10-CM | POA: Diagnosis not present

## 2019-01-22 DIAGNOSIS — H353111 Nonexudative age-related macular degeneration, right eye, early dry stage: Secondary | ICD-10-CM | POA: Diagnosis not present

## 2019-01-25 ENCOUNTER — Telehealth: Payer: Self-pay

## 2019-01-25 DIAGNOSIS — H353221 Exudative age-related macular degeneration, left eye, with active choroidal neovascularization: Secondary | ICD-10-CM | POA: Diagnosis not present

## 2019-01-25 NOTE — Telephone Encounter (Signed)
lmom for prescreen  

## 2019-01-25 NOTE — Telephone Encounter (Signed)

## 2019-02-01 ENCOUNTER — Ambulatory Visit (INDEPENDENT_AMBULATORY_CARE_PROVIDER_SITE_OTHER): Payer: Medicare Other | Admitting: Pharmacist Clinician (PhC)/ Clinical Pharmacy Specialist

## 2019-02-01 ENCOUNTER — Other Ambulatory Visit: Payer: Self-pay

## 2019-02-01 DIAGNOSIS — Z952 Presence of prosthetic heart valve: Secondary | ICD-10-CM | POA: Diagnosis not present

## 2019-02-01 DIAGNOSIS — Z5181 Encounter for therapeutic drug level monitoring: Secondary | ICD-10-CM

## 2019-02-01 DIAGNOSIS — I4891 Unspecified atrial fibrillation: Secondary | ICD-10-CM | POA: Diagnosis not present

## 2019-02-01 LAB — POCT INR: INR: 2.5 (ref 2.0–3.0)

## 2019-03-15 ENCOUNTER — Encounter (INDEPENDENT_AMBULATORY_CARE_PROVIDER_SITE_OTHER): Payer: Self-pay

## 2019-03-15 ENCOUNTER — Other Ambulatory Visit: Payer: Self-pay

## 2019-03-15 ENCOUNTER — Ambulatory Visit (INDEPENDENT_AMBULATORY_CARE_PROVIDER_SITE_OTHER): Payer: Medicare Other | Admitting: Pharmacist Clinician (PhC)/ Clinical Pharmacy Specialist

## 2019-03-15 DIAGNOSIS — I4891 Unspecified atrial fibrillation: Secondary | ICD-10-CM

## 2019-03-15 DIAGNOSIS — Z5181 Encounter for therapeutic drug level monitoring: Secondary | ICD-10-CM | POA: Diagnosis not present

## 2019-03-15 DIAGNOSIS — Z952 Presence of prosthetic heart valve: Secondary | ICD-10-CM

## 2019-03-15 LAB — POCT INR: INR: 2.5 (ref 2.0–3.0)

## 2019-03-26 DIAGNOSIS — H35033 Hypertensive retinopathy, bilateral: Secondary | ICD-10-CM | POA: Diagnosis not present

## 2019-03-26 DIAGNOSIS — H353221 Exudative age-related macular degeneration, left eye, with active choroidal neovascularization: Secondary | ICD-10-CM | POA: Diagnosis not present

## 2019-03-26 DIAGNOSIS — H43813 Vitreous degeneration, bilateral: Secondary | ICD-10-CM | POA: Diagnosis not present

## 2019-03-26 DIAGNOSIS — H353112 Nonexudative age-related macular degeneration, right eye, intermediate dry stage: Secondary | ICD-10-CM | POA: Diagnosis not present

## 2019-03-27 ENCOUNTER — Ambulatory Visit (INDEPENDENT_AMBULATORY_CARE_PROVIDER_SITE_OTHER): Payer: Medicare Other | Admitting: *Deleted

## 2019-03-27 DIAGNOSIS — I4891 Unspecified atrial fibrillation: Secondary | ICD-10-CM

## 2019-03-27 DIAGNOSIS — I447 Left bundle-branch block, unspecified: Secondary | ICD-10-CM

## 2019-03-27 LAB — CUP PACEART REMOTE DEVICE CHECK
Battery Remaining Longevity: 106 mo
Battery Remaining Percentage: 95.5 %
Battery Voltage: 2.96 V
Brady Statistic AP VP Percent: 78 %
Brady Statistic AP VS Percent: 1 %
Brady Statistic AS VP Percent: 21 %
Brady Statistic AS VS Percent: 1 %
Brady Statistic RA Percent Paced: 76 %
Brady Statistic RV Percent Paced: 99 %
Date Time Interrogation Session: 20200811060014
Implantable Lead Implant Date: 20161108
Implantable Lead Implant Date: 20161108
Implantable Lead Location: 753859
Implantable Lead Location: 753860
Implantable Pulse Generator Implant Date: 20161108
Lead Channel Impedance Value: 410 Ohm
Lead Channel Impedance Value: 440 Ohm
Lead Channel Pacing Threshold Amplitude: 0.625 V
Lead Channel Pacing Threshold Amplitude: 0.75 V
Lead Channel Pacing Threshold Pulse Width: 0.5 ms
Lead Channel Pacing Threshold Pulse Width: 0.5 ms
Lead Channel Sensing Intrinsic Amplitude: 12 mV
Lead Channel Sensing Intrinsic Amplitude: 5 mV
Lead Channel Setting Pacing Amplitude: 0.875
Lead Channel Setting Pacing Amplitude: 2 V
Lead Channel Setting Pacing Pulse Width: 0.5 ms
Lead Channel Setting Sensing Sensitivity: 5 mV
Pulse Gen Model: 2240
Pulse Gen Serial Number: 7825693

## 2019-04-06 NOTE — Progress Notes (Signed)
Remote pacemaker transmission.   

## 2019-04-16 ENCOUNTER — Ambulatory Visit: Payer: Medicare Other | Admitting: Cardiology

## 2019-04-26 ENCOUNTER — Ambulatory Visit (INDEPENDENT_AMBULATORY_CARE_PROVIDER_SITE_OTHER): Payer: Medicare Other | Admitting: Pharmacist

## 2019-04-26 ENCOUNTER — Other Ambulatory Visit: Payer: Self-pay

## 2019-04-26 DIAGNOSIS — Z5181 Encounter for therapeutic drug level monitoring: Secondary | ICD-10-CM

## 2019-04-26 DIAGNOSIS — Z952 Presence of prosthetic heart valve: Secondary | ICD-10-CM

## 2019-04-26 DIAGNOSIS — I4891 Unspecified atrial fibrillation: Secondary | ICD-10-CM

## 2019-04-26 LAB — POCT INR: INR: 3 (ref 2.0–3.0)

## 2019-05-02 ENCOUNTER — Encounter: Payer: Self-pay | Admitting: Physician Assistant

## 2019-05-02 ENCOUNTER — Ambulatory Visit (INDEPENDENT_AMBULATORY_CARE_PROVIDER_SITE_OTHER): Payer: Medicare Other | Admitting: Physician Assistant

## 2019-05-02 ENCOUNTER — Other Ambulatory Visit: Payer: Self-pay

## 2019-05-02 VITALS — BP 130/62 | HR 58 | Ht 69.0 in | Wt 226.8 lb

## 2019-05-02 DIAGNOSIS — I421 Obstructive hypertrophic cardiomyopathy: Secondary | ICD-10-CM

## 2019-05-02 DIAGNOSIS — I4819 Other persistent atrial fibrillation: Secondary | ICD-10-CM | POA: Diagnosis not present

## 2019-05-02 DIAGNOSIS — I10 Essential (primary) hypertension: Secondary | ICD-10-CM

## 2019-05-02 DIAGNOSIS — Z952 Presence of prosthetic heart valve: Secondary | ICD-10-CM

## 2019-05-02 DIAGNOSIS — Z950 Presence of cardiac pacemaker: Secondary | ICD-10-CM

## 2019-05-02 NOTE — Patient Instructions (Addendum)
Medication Instructions:  Your physician recommends that you continue on your current medications as directed. Please refer to the Current Medication list given to you today.  If you need a refill on your cardiac medications before your next appointment, please call your pharmacy.   Lab work: None Ordered   Testing/Procedures: None Ordered  Follow-Up: At [facility], you and your health needs are our priority.  As part of our continuing mission to provide you with exceptional heart care, we have created designated Provider Care Teams.  These Care Teams include your primary Cardiologist (physician) and Advanced Practice Providers (APPs -  Physician Assistants and Nurse Practitioners) who all work together to provide you with the care you need, when you need it. You will need a follow up appointment in 6 months.  Please call our office 2 months in advance to schedule this appointment.  You may see Ena Dawley, MD or one of the following Advanced Practice Providers on your designated Care Team:   [place], PA-C Melina Copa, PA-C . Ermalinda Barrios, PA-C  Any Other Special Instructions Will Be Listed Below (If Applicable).  Follow-up with Dr.Taylor on July 04, 2019 for Pacemaker Check  Follow up with Urologist regarding urinary frequency

## 2019-05-02 NOTE — Progress Notes (Signed)
Cardiology Office Note    Date:  05/02/2019   ID:  Robert Anderson, DOB 1924-05-25, MRN [id number]  PCP:  Dorothyann Peng, NP  Cardiologist: Ena Dawley, MD EPS: Cristopher Peru, MD  Chief Complaint  Patient presents with  . Follow-up    History of Present Illness:  Robert Anderson is a 85 y.o. 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  Last office visit with Dr. Meda Coffee 10/16/2018 Lopressor was changed to Toprol-XL.  ED echo 2017 normal LVEF 60 to 65%, hypertrophic cardiomyopathy no LV outflow tract gradient was measured, TAVR functioning normally, mean gradient 11 mmHg.  A couple of months ago he had a dizzy spell. Occurred at rest and only lasted 30 seconds The nurse at [facility] felt he was dehydrated. He now is trying to drink 2-3 cups of water and he hasn't had any more dizziness. Denies chest pain, palpitations, edema. Pacer check 03/27/19 Afib 1.6% time and stable. He misses going to the [facility] to do his exercises. Rides an exercise bike for 15 min and walks up and down the hall 4-5 times/day.  Complains of urinary frequency and getting up every hour and a half at night.  Not sure if the Flomax is helping him 3 times a week.  Has not seen his urologist in many years.    Past Medical History:  Diagnosis Date  . Atrial fibrillation ([place])    a. dx after lead revision 06/26/15 >> anticoag not started due to recent pericardial effusion in setting of perforation and lead revision  . Bradycardia    a. HR 30s in clinic 06/2015 - BB discontinued.  . DEGENERATIVE JOINT DISEASE, SPINE   . ERECTILE DYSFUNCTION   . Essential hypertension   . First degree AV block   . HEARING LOSS   . Heart block    s/p Pacemaker  . Hyperlipidemia   . Hypertrophic obstructive cardiomyopathy ([place])   .  LBBB (left bundle branch block)   . OA (osteoarthritis)    both hips  . Pericardial effusion    a. micorpeforation s/p lead revision 06/26/15  . Presence of permanent cardiac pacemaker 06/24/2015   St Jude Assurity  . Prostate cancer [facility]) 2010   "external radiation; 40 treatments"  . 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 Severe aortic stenosis    a. s/p TAVR 03/2015. Pre-op  LHC 02/2015: minor nonobstructive CAD.  Marland Kitchen Squamous cell skin cancer    left lower leg  . Stroke ([place]) 02/19/2014   Small infarct high posterior left frontal lobe on MRI   . TIA (transient ischemic attack) 01/21/2014   5 min spell aphasia without assoc sx      Past Surgical History:  Procedure Laterality Date  . ANTERIOR CERVICAL DECOMP/DISCECTOMY FUSION  ~ 2000   Dr. Louanne Skye  . BACK SURGERY    . CARDIAC CATHETERIZATION N/A 02/20/2015   Procedure: Right/Left Heart Cath and Coronary Angiography;  Surgeon: Sherren Mocha, MD;  Location: [place] CV LAB;  Service: Cardiovascular;  Laterality: N/A;  . CARDIAC VALVE REPLACEMENT    . CATARACT EXTRACTION W/ INTRAOCULAR LENS  IMPLANT, BILATERAL  2004; 2010   right; left  . EP IMPLANTABLE DEVICE  N/A 06/24/2015   Procedure: Pacemaker Implant;  Surgeon: Evans Lance, MD;  Location: [place] CV LAB;  Service: Cardiovascular; St Jude Assurity   . EP IMPLANTABLE DEVICE N/A 06/26/2015   Procedure: Lead Revision/Repair;  Surgeon: Will Meredith Leeds, MD;  Location: [place] CV LAB;  Service: Cardiovascular;  Laterality: N/A;  . INSERT / REPLACE / REMOVE PACEMAKER  06/24/2015  . JOINT REPLACEMENT    . SQUAMOUS CELL CARCINOMA EXCISION Left X 3   "lower leg"  . TEE WITHOUT CARDIOVERSION N/A 03/07/2015   Procedure: TRANSESOPHAGEAL ECHOCARDIOGRAM (TEE);  Surgeon: Dorothy Spark, MD;  Location: [place];  Service: Cardiovascular;  Laterality: N/A;  . TEE  WITHOUT CARDIOVERSION N/A 03/25/2015   Procedure: TRANSESOPHAGEAL ECHOCARDIOGRAM (TEE);  Surgeon: Burnell Blanks, MD;  Location: [place];  Service: Open Heart Surgery;  Laterality: N/A;  . TONSILLECTOMY    . TOTAL SHOULDER ARTHROPLASTY Right 2007   "wore it out playing tennis"  . TRANSCATHETER AORTIC VALVE REPLACEMENT, TRANSFEMORAL N/A 03/25/2015   Procedure: TRANSCATHETER AORTIC VALVE REPLACEMENT, TRANSFEMORAL;  Surgeon: Burnell Blanks, MD;  Location: [place];  Service: Open Heart Surgery;  Laterality: N/A;    Current Medications: Current Meds  Medication Sig  . acetaminophen (TYLENOL) 325 MG tablet Take 1-2 tablets (325-650 mg total) by mouth every 4 (four) hours as needed for mild pain.  Marland Kitchen amoxicillin (AMOXIL) 500 MG capsule TAKE 4 CAPSULES BY MOUTH 1 HOUR BEFORE APPT  . docusate sodium (COLACE) 100 MG capsule Take 300 mg by mouth at bedtime.   . metoprolol succinate (TOPROL XL) 25 MG 24 hr tablet Take 1 tablet (25 mg total) by mouth daily.  . Multiple Vitamins-Minerals (PRESERVISION AREDS 2+MULTI VIT PO) Take 1 tablet by mouth 2 (two) times daily.  . Omega-3 Fatty Acids (FISH OIL) 1000 MG CAPS Take 1,000 mg by mouth 2 (two) times daily.  Marland Kitchen PREVIDENT 5000 SENSITIVE 1.1-5 % PSTE BRUSH AS DIRECTED  . tamsulosin (FLOMAX) 0.4 MG CAPS capsule TAKE ONE CAPSULE BY MOUTH 3 TIMES A WEEK ON MONDAYS, WEDNESDAYS, AND FRIDAYS  . UNABLE TO FIND Med Name: PRO VISION  . warfarin (COUMADIN) 3 MG tablet TAKE 1 TABLET DAILY AS DIRECTED BY COUMADIN CLINIC     Allergies:   Patient has no known allergies.   Social History   Socioeconomic History  . Marital status: Married    Spouse name: Not on file  . Number of children: 4  . Years of education: Not on file  . Highest education level: Not on file  Occupational History  . Occupation: Retired  Scientific laboratory technician  . Financial resource strain: Not hard at all  . Food insecurity    Worry: Never true    Inability: Never true  . Transportation  needs    Medical: No    Non-medical: No  Tobacco Use  . Smoking status: Never Smoker  . Smokeless tobacco: Never Used  Substance and Sexual Activity  . Alcohol use: No    Alcohol/week: 0.0 standard drinks  . Drug use: No  . Sexual activity: Not Currently  Lifestyle  . Physical activity    Days per week: 5 days    Minutes per session: 60 min  . Stress: Not at all  Relationships  . Social connections    Talks on phone: More than three times a week    Gets together: More than three times a week    Attends religious service: Never    Active member of club  or organization: Yes    Attends meetings of clubs or organizations: More than 4 times per year    Relationship status: Married  Other Topics Concern  . Not on file  Social History Narrative   Patient lives in [place] with his wife. 2nd marriage    4 children    He goes to the [facility] three days a week      09/27/18: Lives at [facility] with wife   Still goes to [facility], 2X/week, bicycles/uses sitting eliptical in room daily   Enjoys singing, part of singing group at center   Active in discussion groups at center           Family History:  The patient's   family history includes Brain cancer in his sister; CVA in his father and mother; Cancer in his father; Heart attack in his brother; Heart disease in his brother and mother; Lung cancer in his brother; Prostate cancer in his brother; Stroke in his mother.   ROS:   Please see the history of present illness.    ROS All other systems reviewed and are negative.   PHYSICAL EXAM:   VS:  BP 130/62   Pulse (!) 58   Ht 5\' 9"  (1.753 m)   Wt 226 lb 12.8 oz (102.9 kg)   SpO2 97%   BMI 33.49 kg/m   Physical Exam  GEN: Obese, in no acute distress  Neck: no JVD, carotid bruits, or masses Cardiac:RRR; 2/6 systolic murmur at the left sternal border Respiratory:  clear to auscultation bilaterally, normal work of breathing GI: soft, nontender, nondistended, + BS  [id number] leg edema without cyanosis, clubbing, or  Good distal pulses bilaterally MS: no deformity or atrophy  Skin: warm and dry, no rash Neuro:  Alert and Oriented x 3, Strength and sensation are intact Psych: euthymic mood, full affect  Wt Readings from Last 3 Encounters:  05/02/19 226 lb 12.8 oz (102.9 kg)  10/16/18 222 lb 12.8 oz (101.1 kg)  09/27/18 222 lb (100.7 kg)      Studies/Labs Reviewed:   EKG:  EKG is not ordered today.    Recent Labs: 10/16/2018: ALT 13; BUN 18; Creatinine, Ser 0.98; Hemoglobin 15.0; Platelets 209; Potassium 4.4; Sodium 142; TSH 3.080   Lipid Panel    Component Value Date/Time   CHOL 200 08/11/2018 0950   TRIG 252.0 (H) 08/11/2018 0950   HDL 39.40 08/11/2018 0950   CHOLHDL 5 08/11/2018 0950   VLDL 50.4 (H) 08/11/2018 0950   LDLCALC 136 (H) 07/29/2016 0855   LDLDIRECT 127.0 08/11/2018 0950    Additional studies/ records that were reviewed today include:  2D echo 2017Study Conclusions   - Left ventricle: The cavity size was normal. There was moderate   asymmetric septal hypertrophy. Systolic function was normal. The   estimated ejection fraction was in the range of 60% to 65%. Wall   motion was normal; there were no regional wall motion   abnormalities. Doppler parameters are consistent with abnormal   left ventricular relaxation (grade 1 diastolic dysfunction). No   LV outflow tract gradient was measured. - Aortic valve: Bioprosthetic aortic valve s/p transcatheter aortic   valve replacement. The TAVR valve appeared to function normally.   There was no significant regurgitation. Mean gradient (S): 11 mm   Hg. - Mitral valve: Mild systolic anterior motion of the anterior   mitral leaflet. - Left atrium: The atrium was mildly dilated. - Right ventricle: The cavity size was normal. Systolic function  was normal. - Right atrium: The atrium was mildly dilated. - Pulmonary arteries: No complete TR doppler jet so unable to   estimate PA  systolic pressure. - Inferior vena cava: The vessel was normal in size. The   respirophasic diameter changes were in the normal range (>= 50%),   consistent with normal central venous pressure.   Impressions:   - Normal LV size with moderate asymmetric septal hypertrophy. EF   60-65%. Mild systolic anterior motion of the mitral valve. No LV   outflow tract gradient measured. Consistent with hypertrophic   cardiomyopathy. Normal RV size and systolic function. s/p TAVR,   valve appears to function normally.       ASSESSMENT:    1. S/P TAVR (transcatheter aortic valve replacement)   2. Persistent atrial fibrillation   3. Hypertrophic obstructive cardiomyopathy ([place])   4. Essential hypertension   5. Presence of permanent cardiac pacemaker      PLAN:  In order of problems listed above:  Status post TAVR 2016 last echo 2017 functioning normally  Paroxysmal atrial fibrillation on Coumadin and Toprol  Hypertrophic obstructive cardiomyopathy last echo 2017 on beta-blocker  Essential hypertension controlled  Permanent pacemaker followed by Dr. Lovena Le follow-up in November  Urinary frequency recommend he see his urologist Dr. Karsten Ro  Medication Adjustments/Labs and Tests Ordered: Current medicines are reviewed at length with the patient today.  Concerns regarding medicines are outlined above.  Medication changes, Labs and Tests ordered today are listed in the Patient Instructions below. Patient Instructions  Medication Instructions:  Your physician recommends that you continue on your current medications as directed. Please refer to the Current Medication list given to you today.  If you need a refill on your cardiac medications before your next appointment, please call your pharmacy.   Lab work: None Ordered   Testing/Procedures: None Ordered  Follow-Up: At [facility], you and your health needs are our priority.  As part of our continuing mission to provide you  with exceptional heart care, we have created designated Provider Care Teams.  These Care Teams include your primary Cardiologist (physician) and Advanced Practice Providers (APPs -  Physician Assistants and Nurse Practitioners) who all work together to provide you with the care you need, when you need it. You will need a follow up appointment in 6 months.  Please call our office 2 months in advance to schedule this appointment.  You may see Ena Dawley, MD or one of the following Advanced Practice Providers on your designated Care Team:   [place], PA-C Melina Copa, PA-C . Ermalinda Barrios, PA-C  Any Other Special Instructions Will Be Listed Below (If Applicable).  Follow-up with Dr.Taylor on July 04, 2019 for Pacemaker Check  Follow up with Urologist regarding urinary frequency      Signed, Ermalinda Barrios, PA-C  05/02/2019 2:31 PM    [address], [place], [place]  [address] Phone: [phone]; Fax: [phone]

## 2019-05-03 ENCOUNTER — Ambulatory Visit: Payer: Medicare Other | Admitting: Cardiology

## 2019-05-17 ENCOUNTER — Telehealth: Payer: Self-pay | Admitting: Cardiology

## 2019-05-17 NOTE — Telephone Encounter (Signed)
Called and spoke to the patient. Made him aware that he will need to request order from his PCP.

## 2019-05-17 NOTE — Telephone Encounter (Signed)
° ° °  Patient requesting order for shower chair.

## 2019-05-21 ENCOUNTER — Telehealth: Payer: Self-pay

## 2019-05-21 ENCOUNTER — Other Ambulatory Visit: Payer: Self-pay | Admitting: Cardiology

## 2019-05-21 NOTE — Telephone Encounter (Signed)
Copied from [place] [id number]. Topic: General - Other >> May 21, 2019 12:47 PM Carolyn Stare wrote: Pt is asking for a doctor's order to get a shower chair, [facility] require a RX, they said they faxed over the req and pt is trying to find out if it was received

## 2019-05-22 NOTE — Telephone Encounter (Signed)
Received form.  Tommi Rumps has signed and I faxed.  Received confirmation the transmission was successful.  Nothing further needed.

## 2019-05-22 NOTE — Telephone Encounter (Signed)
Dx: Gait Instability

## 2019-06-07 ENCOUNTER — Ambulatory Visit (INDEPENDENT_AMBULATORY_CARE_PROVIDER_SITE_OTHER): Payer: Medicare Other | Admitting: Pharmacist

## 2019-06-07 ENCOUNTER — Other Ambulatory Visit: Payer: Self-pay

## 2019-06-07 DIAGNOSIS — Z952 Presence of prosthetic heart valve: Secondary | ICD-10-CM | POA: Diagnosis not present

## 2019-06-07 DIAGNOSIS — Z5181 Encounter for therapeutic drug level monitoring: Secondary | ICD-10-CM

## 2019-06-07 DIAGNOSIS — I639 Cerebral infarction, unspecified: Secondary | ICD-10-CM

## 2019-06-07 LAB — POCT INR: INR: 2.9 (ref 2.0–3.0)

## 2019-06-21 ENCOUNTER — Other Ambulatory Visit: Payer: Self-pay | Admitting: Cardiology

## 2019-06-21 DIAGNOSIS — Z952 Presence of prosthetic heart valve: Secondary | ICD-10-CM

## 2019-06-21 DIAGNOSIS — I10 Essential (primary) hypertension: Secondary | ICD-10-CM

## 2019-06-27 ENCOUNTER — Ambulatory Visit (INDEPENDENT_AMBULATORY_CARE_PROVIDER_SITE_OTHER): Payer: Medicare Other | Admitting: *Deleted

## 2019-06-27 DIAGNOSIS — I442 Atrioventricular block, complete: Secondary | ICD-10-CM | POA: Diagnosis not present

## 2019-06-27 DIAGNOSIS — I447 Left bundle-branch block, unspecified: Secondary | ICD-10-CM

## 2019-06-27 LAB — CUP PACEART REMOTE DEVICE CHECK
Battery Remaining Longevity: 109 mo
Battery Remaining Percentage: 95.5 %
Battery Voltage: 2.96 V
Brady Statistic AP VP Percent: 77 %
Brady Statistic AP VS Percent: 1 %
Brady Statistic AS VP Percent: 22 %
Brady Statistic AS VS Percent: 1 %
Brady Statistic RA Percent Paced: 76 %
Brady Statistic RV Percent Paced: 99 %
Date Time Interrogation Session: 20201110070013
Implantable Lead Implant Date: 20161108
Implantable Lead Implant Date: 20161108
Implantable Lead Location: 753859
Implantable Lead Location: 753860
Implantable Pulse Generator Implant Date: 20161108
Lead Channel Impedance Value: 440 Ohm
Lead Channel Impedance Value: 460 Ohm
Lead Channel Pacing Threshold Amplitude: 0.625 V
Lead Channel Pacing Threshold Amplitude: 0.75 V
Lead Channel Pacing Threshold Pulse Width: 0.5 ms
Lead Channel Pacing Threshold Pulse Width: 0.5 ms
Lead Channel Sensing Intrinsic Amplitude: 12 mV
Lead Channel Sensing Intrinsic Amplitude: 5 mV
Lead Channel Setting Pacing Amplitude: 0.875
Lead Channel Setting Pacing Amplitude: 2 V
Lead Channel Setting Pacing Pulse Width: 0.5 ms
Lead Channel Setting Sensing Sensitivity: 5 mV
Pulse Gen Model: 2240
Pulse Gen Serial Number: 7825693

## 2019-07-04 ENCOUNTER — Other Ambulatory Visit: Payer: Self-pay

## 2019-07-04 ENCOUNTER — Ambulatory Visit: Payer: Medicare Other | Admitting: Internal Medicine

## 2019-07-04 ENCOUNTER — Encounter: Payer: Self-pay | Admitting: Internal Medicine

## 2019-07-04 VITALS — BP 132/82 | HR 60 | Ht 69.0 in | Wt 224.2 lb

## 2019-07-04 DIAGNOSIS — I442 Atrioventricular block, complete: Secondary | ICD-10-CM

## 2019-07-04 DIAGNOSIS — I350 Nonrheumatic aortic (valve) stenosis: Secondary | ICD-10-CM

## 2019-07-04 DIAGNOSIS — Z950 Presence of cardiac pacemaker: Secondary | ICD-10-CM | POA: Diagnosis not present

## 2019-07-04 NOTE — Progress Notes (Signed)
HPI Robert Anderson returns today for ongoing evaluation of his PPM. He is a pleasant 85 yo man with CHB, s/p PPM insertion who has recently moved from his house to [facility]. He feels well and is exercising regularly. No chest pain or sob. No edema.Since I saw him last, he has undergone TAVR. He feels well with no chest pain. He does admit to being a bit more sedentary. No Known Allergies   Current Outpatient Medications  Medication Sig Dispense Refill  . acetaminophen (TYLENOL) 325 MG tablet Take 1-2 tablets (325-650 mg total) by mouth every 4 (four) hours as needed for mild pain.    Marland Kitchen amoxicillin (AMOXIL) 500 MG capsule TAKE 4 CAPSULES BY MOUTH 1 HOUR BEFORE APPT    . docusate sodium (COLACE) 100 MG capsule Take 300 mg by mouth at bedtime.     . metoprolol succinate (TOPROL-XL) 25 MG 24 hr tablet TAKE 1 TABLET BY MOUTH EVERY DAY 90 tablet 3  . mirabegron ER (MYRBETRIQ) 50 MG TB24 tablet Take 50 mg by mouth daily.    . Multiple Vitamins-Minerals (PRESERVISION AREDS 2+MULTI VIT PO) Take 1 tablet by mouth 2 (two) times daily.    . Omega-3 Fatty Acids (FISH OIL) 1000 MG CAPS Take 1,000 mg by mouth 2 (two) times daily.    Marland Kitchen PREVIDENT 5000 SENSITIVE 1.1-5 % PSTE BRUSH AS DIRECTED    . UNABLE TO FIND Med Name: PRO VISION    . warfarin (COUMADIN) 3 MG tablet TAKE 1 TABLET DAILY AS DIRECTED BY COUMADIN CLINIC 90 tablet 1   No current facility-administered medications for this visit.      Past Medical History:  Diagnosis Date  . Atrial fibrillation ([place])    a. dx after lead revision 06/26/15 >> anticoag not started due to recent pericardial effusion in setting of perforation and lead revision  . Bradycardia    a. HR 30s in clinic 06/2015 - BB discontinued.  . DEGENERATIVE JOINT DISEASE, SPINE   . ERECTILE DYSFUNCTION   . Essential hypertension   . First degree AV block   . HEARING LOSS   . Heart block    s/p Pacemaker  . Hyperlipidemia   . Hypertrophic obstructive cardiomyopathy  ([place])   . LBBB (left bundle branch block)   . OA (osteoarthritis)    both hips  . Pericardial effusion    a. micorpeforation s/p lead revision 06/26/15  . Presence of permanent cardiac pacemaker 06/24/2015   St Jude Assurity  . Prostate cancer [facility]) 2010   "external radiation; 40 treatments"  . 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 Severe aortic stenosis    a. s/p TAVR 03/2015. Pre-op  LHC 02/2015: minor nonobstructive CAD.  Marland Kitchen Squamous cell skin cancer    left lower leg  . Stroke ([place]) 02/19/2014   Small infarct high posterior left frontal lobe on MRI   . TIA (transient ischemic attack) 01/21/2014   5 min spell aphasia without assoc sx      ROS:   All systems reviewed and negative except as noted in the HPI.   Past Surgical History:  Procedure Laterality Date  . ANTERIOR CERVICAL DECOMP/DISCECTOMY FUSION  ~ 2000   Dr. Louanne Skye  . BACK SURGERY    . CARDIAC CATHETERIZATION N/A 02/20/2015   Procedure: Right/Left Heart Cath and Coronary Angiography;  Surgeon: Sherren Mocha, MD;  Location: [facility]  INVASIVE CV LAB;  Service: Cardiovascular;  Laterality: N/A;  . CARDIAC VALVE REPLACEMENT    . CATARACT EXTRACTION W/ INTRAOCULAR LENS  IMPLANT, BILATERAL  2004; 2010   right; left  . EP IMPLANTABLE DEVICE N/A 06/24/2015   Procedure: Pacemaker Implant;  Surgeon: Evans Lance, MD;  Location: [place] CV LAB;  Service: Cardiovascular; St Jude Assurity   . EP IMPLANTABLE DEVICE N/A 06/26/2015   Procedure: Lead Revision/Repair;  Surgeon: Will Meredith Leeds, MD;  Location: [place] CV LAB;  Service: Cardiovascular;  Laterality: N/A;  . INSERT / REPLACE / REMOVE PACEMAKER  06/24/2015  . JOINT REPLACEMENT    . SQUAMOUS CELL CARCINOMA EXCISION Left X 3   "lower leg"  . TEE WITHOUT CARDIOVERSION N/A 03/07/2015   Procedure: TRANSESOPHAGEAL ECHOCARDIOGRAM (TEE);  Surgeon: Dorothy Spark, MD;  Location: [place];  Service: Cardiovascular;  Laterality: N/A;  . TEE WITHOUT CARDIOVERSION N/A 03/25/2015   Procedure: TRANSESOPHAGEAL ECHOCARDIOGRAM (TEE);  Surgeon: Burnell Blanks, MD;  Location: [place];  Service: Open Heart Surgery;  Laterality: N/A;  . TONSILLECTOMY    . TOTAL SHOULDER ARTHROPLASTY Right 2007   "wore it out playing tennis"  . TRANSCATHETER AORTIC VALVE REPLACEMENT, TRANSFEMORAL N/A 03/25/2015   Procedure: TRANSCATHETER AORTIC VALVE REPLACEMENT, TRANSFEMORAL;  Surgeon: Burnell Blanks, MD;  Location: [place];  Service: Open Heart Surgery;  Laterality: N/A;     Family History  Problem Relation Age of Onset  . Heart disease Mother   . CVA Mother   . Stroke Mother   . CVA Father   . Cancer Father   . Brain cancer Sister   . Prostate cancer Brother   . Heart disease Brother   . Lung cancer Brother   . Heart attack Brother   . Hypertension Neg Hx      Social History   Socioeconomic History  . Marital status: Married    Spouse name: Not on file  . Number of children: 4  . Years of education: Not on file  . Highest education level: Not on file  Occupational History  . Occupation: Retired  Scientific laboratory technician  . Financial resource strain: Not hard at all  . Food insecurity    Worry: Never true    Inability: Never true  . Transportation needs    Medical: No    Non-medical: No  Tobacco Use  . Smoking status: Never Smoker  . Smokeless tobacco: Never Used  Substance and Sexual Activity  . Alcohol use: No    Alcohol/week: 0.0 standard drinks  . Drug use: No  . Sexual activity: Not Currently  Lifestyle  . Physical activity    Days per week: 5 days    Minutes per session: 60 min  . Stress: Not at all  Relationships  . Social connections    Talks on phone: More than three times a week    Gets together: More than three times a week    Attends religious service: Never    Active member of club or organization: Yes    Attends  meetings of clubs or organizations: More than 4 times per year    Relationship status: Married  . Intimate partner violence    Fear of current or ex partner: No    Emotionally abused: No    Physically abused: No    Forced sexual activity: No  Other Topics Concern  . Not on file  Social History Narrative   Patient lives in [place] with his  wife. 2nd marriage    4 children    He goes to the [facility] three days a week      09/27/18: Lives at [facility] with wife   Still goes to [employer], 2X/week, bicycles/uses sitting eliptical in room daily   Enjoys singing, part of singing group at center   Active in discussion groups at center           BP 132/82   Pulse 60   Ht 5\' 9"  (1.753 m)   Wt 224 lb 3.2 oz (101.7 kg)   SpO2 98%   BMI 33.11 kg/m   Physical Exam:  Well appearing elderly man, NAD HEENT: Unremarkable Neck:  No JVD, no thyromegally Lymphatics:  No adenopathy Back:  No CVA tenderness Lungs:  Clear with no wheezes HEART:  Regular rate rhythm, no murmurs, no rubs, no clicks Abd:  soft, positive bowel sounds, no organomegally, no rebound, no guarding Ext:  2 plus pulses, no edema, no cyanosis, no clubbing Skin:  No rashes no nodules Neuro:  CN II through XII intact, motor grossly intact   DEVICE  Normal device function.  See PaceArt for details.   Assess/Plan: 1. Atrial fib - his VR is well controlled.  2. CHB - he has no escape today. He is asymptomatic, s/p PPM insertion. 3. PPM - interrogation of his St. Jude DDD PM demonstrates normal device function.   Robert Anderson.D.

## 2019-07-04 NOTE — Patient Instructions (Signed)
Medication Instructions:  Your physician recommends that you continue on your current medications as directed. Please refer to the Current Medication list given to you today.  Labwork: None ordered.  Testing/Procedures: None ordered.  Follow-Up: Your physician wants you to follow-up in: one year with Dr. Lovena Le.   You will receive a reminder letter in the mail two months in advance. If you don't receive a letter, please call our office to schedule the follow-up appointment.  Remote monitoring is used to monitor your Pacemaker from home. This monitoring reduces the number of office visits required to check your device to one time per year. It allows [place] to keep an eye on the functioning of your device to ensure it is working properly. You are scheduled for a device check from home on 09/26/2019. You may send your transmission at any time that day. If you have a wireless device, the transmission will be sent automatically. After your physician reviews your transmission, you will receive a postcard with your next transmission date.  Any Other Special Instructions Will Be Listed Below (If Applicable).  If you need a refill on your cardiac medications before your next appointment, please call your pharmacy.

## 2019-07-18 NOTE — Progress Notes (Signed)
Remote pacemaker transmission.   

## 2019-07-19 ENCOUNTER — Ambulatory Visit (INDEPENDENT_AMBULATORY_CARE_PROVIDER_SITE_OTHER): Payer: Medicare Other | Admitting: Pharmacist

## 2019-07-19 ENCOUNTER — Other Ambulatory Visit: Payer: Self-pay

## 2019-07-19 DIAGNOSIS — Z79899 Other long term (current) drug therapy: Secondary | ICD-10-CM | POA: Diagnosis not present

## 2019-07-19 DIAGNOSIS — I4891 Unspecified atrial fibrillation: Secondary | ICD-10-CM

## 2019-07-19 LAB — POCT INR: INR: 3.7 — AB (ref 2.0–3.0)

## 2019-08-09 ENCOUNTER — Other Ambulatory Visit: Payer: Self-pay

## 2019-08-09 ENCOUNTER — Ambulatory Visit (INDEPENDENT_AMBULATORY_CARE_PROVIDER_SITE_OTHER): Payer: Medicare Other | Admitting: Pharmacist Clinician (PhC)/ Clinical Pharmacy Specialist

## 2019-08-09 DIAGNOSIS — Z952 Presence of prosthetic heart valve: Secondary | ICD-10-CM

## 2019-08-09 DIAGNOSIS — I4891 Unspecified atrial fibrillation: Secondary | ICD-10-CM | POA: Diagnosis not present

## 2019-08-09 DIAGNOSIS — Z79899 Other long term (current) drug therapy: Secondary | ICD-10-CM | POA: Diagnosis not present

## 2019-08-09 DIAGNOSIS — Z5181 Encounter for therapeutic drug level monitoring: Secondary | ICD-10-CM

## 2019-08-09 DIAGNOSIS — I639 Cerebral infarction, unspecified: Secondary | ICD-10-CM

## 2019-08-09 LAB — POCT INR: INR: 3.1 — AB (ref 2.0–3.0)

## 2019-08-14 ENCOUNTER — Encounter: Payer: Medicare Other | Admitting: Adult Health

## 2019-09-05 ENCOUNTER — Ambulatory Visit (INDEPENDENT_AMBULATORY_CARE_PROVIDER_SITE_OTHER): Payer: Medicare Other | Admitting: Pharmacist Clinician (PhC)/ Clinical Pharmacy Specialist

## 2019-09-05 ENCOUNTER — Other Ambulatory Visit: Payer: Self-pay

## 2019-09-05 DIAGNOSIS — Z7901 Long term (current) use of anticoagulants: Secondary | ICD-10-CM

## 2019-09-05 DIAGNOSIS — I4891 Unspecified atrial fibrillation: Secondary | ICD-10-CM | POA: Diagnosis not present

## 2019-09-05 DIAGNOSIS — I639 Cerebral infarction, unspecified: Secondary | ICD-10-CM

## 2019-09-05 DIAGNOSIS — Z952 Presence of prosthetic heart valve: Secondary | ICD-10-CM

## 2019-09-05 DIAGNOSIS — Z5181 Encounter for therapeutic drug level monitoring: Secondary | ICD-10-CM

## 2019-09-05 DIAGNOSIS — Z79899 Other long term (current) drug therapy: Secondary | ICD-10-CM

## 2019-09-05 LAB — POCT INR: INR: 3.3 — AB (ref 2.0–3.0)

## 2019-09-20 ENCOUNTER — Other Ambulatory Visit: Payer: Self-pay | Admitting: Cardiology

## 2019-09-25 LAB — CUP PACEART REMOTE DEVICE CHECK
Battery Remaining Longevity: 110 mo
Battery Remaining Percentage: 95.5 %
Battery Voltage: 2.96 V
Brady Statistic AP VP Percent: 67 %
Brady Statistic AP VS Percent: 1 %
Brady Statistic AS VP Percent: 33 %
Brady Statistic AS VS Percent: 1 %
Brady Statistic RA Percent Paced: 67 %
Brady Statistic RV Percent Paced: 99 %
Date Time Interrogation Session: 20210209020016
Implantable Lead Implant Date: 20161108
Implantable Lead Implant Date: 20161108
Implantable Lead Location: 753859
Implantable Lead Location: 753860
Implantable Pulse Generator Implant Date: 20161108
Lead Channel Impedance Value: 430 Ohm
Lead Channel Impedance Value: 480 Ohm
Lead Channel Pacing Threshold Amplitude: 0.625 V
Lead Channel Pacing Threshold Amplitude: 0.75 V
Lead Channel Pacing Threshold Pulse Width: 0.5 ms
Lead Channel Pacing Threshold Pulse Width: 0.5 ms
Lead Channel Sensing Intrinsic Amplitude: 12 mV
Lead Channel Sensing Intrinsic Amplitude: 5 mV
Lead Channel Setting Pacing Amplitude: 0.875
Lead Channel Setting Pacing Amplitude: 2 V
Lead Channel Setting Pacing Pulse Width: 0.5 ms
Lead Channel Setting Sensing Sensitivity: 5 mV
Pulse Gen Model: 2240
Pulse Gen Serial Number: 7825693

## 2019-09-26 ENCOUNTER — Ambulatory Visit (INDEPENDENT_AMBULATORY_CARE_PROVIDER_SITE_OTHER): Payer: Medicare Other | Admitting: *Deleted

## 2019-09-26 DIAGNOSIS — I442 Atrioventricular block, complete: Secondary | ICD-10-CM | POA: Diagnosis not present

## 2019-09-26 NOTE — Progress Notes (Signed)
PPM Remote  

## 2019-10-03 ENCOUNTER — Other Ambulatory Visit: Payer: Self-pay

## 2019-10-03 ENCOUNTER — Ambulatory Visit (INDEPENDENT_AMBULATORY_CARE_PROVIDER_SITE_OTHER): Payer: Medicare Other | Admitting: Pharmacist

## 2019-10-03 DIAGNOSIS — I4891 Unspecified atrial fibrillation: Secondary | ICD-10-CM | POA: Diagnosis not present

## 2019-10-03 DIAGNOSIS — Z5181 Encounter for therapeutic drug level monitoring: Secondary | ICD-10-CM

## 2019-10-03 DIAGNOSIS — I639 Cerebral infarction, unspecified: Secondary | ICD-10-CM

## 2019-10-03 DIAGNOSIS — Z79899 Other long term (current) drug therapy: Secondary | ICD-10-CM

## 2019-10-03 DIAGNOSIS — Z952 Presence of prosthetic heart valve: Secondary | ICD-10-CM

## 2019-10-03 LAB — POCT INR: INR: 3.3 — AB (ref 2.0–3.0)

## 2019-10-31 ENCOUNTER — Other Ambulatory Visit: Payer: Self-pay

## 2019-11-01 ENCOUNTER — Ambulatory Visit (INDEPENDENT_AMBULATORY_CARE_PROVIDER_SITE_OTHER): Payer: Medicare Other | Admitting: Adult Health

## 2019-11-01 ENCOUNTER — Encounter: Payer: Self-pay | Admitting: Adult Health

## 2019-11-01 ENCOUNTER — Encounter: Payer: Medicare Other | Admitting: Adult Health

## 2019-11-01 VITALS — BP 144/80 | Temp 97.2°F | Wt 226.0 lb

## 2019-11-01 DIAGNOSIS — I10 Essential (primary) hypertension: Secondary | ICD-10-CM | POA: Diagnosis not present

## 2019-11-01 DIAGNOSIS — Z952 Presence of prosthetic heart valve: Secondary | ICD-10-CM

## 2019-11-01 DIAGNOSIS — I4891 Unspecified atrial fibrillation: Secondary | ICD-10-CM

## 2019-11-01 DIAGNOSIS — I442 Atrioventricular block, complete: Secondary | ICD-10-CM

## 2019-11-01 DIAGNOSIS — Z0000 Encounter for general adult medical examination without abnormal findings: Secondary | ICD-10-CM

## 2019-11-01 DIAGNOSIS — E785 Hyperlipidemia, unspecified: Secondary | ICD-10-CM | POA: Diagnosis not present

## 2019-11-01 LAB — CBC WITH DIFFERENTIAL/PLATELET
Basophils Absolute: 0.1 10*3/uL (ref 0.0–0.1)
Basophils Relative: 0.7 % (ref 0.0–3.0)
Eosinophils Absolute: 0.1 10*3/uL (ref 0.0–0.7)
Eosinophils Relative: 1.4 % (ref 0.0–5.0)
HCT: 46.8 % (ref 39.0–52.0)
Hemoglobin: 15.4 g/dL (ref 13.0–17.0)
Lymphocytes Relative: 25 % (ref 12.0–46.0)
Lymphs Abs: 1.7 10*3/uL (ref 0.7–4.0)
MCHC: 32.9 g/dL (ref 30.0–36.0)
MCV: 90.1 fl (ref 78.0–100.0)
Monocytes Absolute: 0.7 10*3/uL (ref 0.1–1.0)
Monocytes Relative: 10.3 % (ref 3.0–12.0)
Neutro Abs: 4.4 10*3/uL (ref 1.4–7.7)
Neutrophils Relative %: 62.6 % (ref 43.0–77.0)
Platelets: 208 10*3/uL (ref 150.0–400.0)
RBC: 5.19 Mil/uL (ref 4.22–5.81)
RDW: 14 % (ref 11.5–15.5)
WBC: 7 10*3/uL (ref 4.0–10.5)

## 2019-11-01 LAB — LIPID PANEL
Cholesterol: 222 mg/dL — ABNORMAL HIGH (ref 0–200)
HDL: 38.5 mg/dL — ABNORMAL LOW (ref >39.00)
NonHDL: 183.47
Total CHOL/HDL Ratio: 6
Triglycerides: 240 mg/dL — ABNORMAL HIGH (ref 0.0–149.0)
VLDL: 48 mg/dL — ABNORMAL HIGH (ref 0.0–40.0)

## 2019-11-01 LAB — COMPREHENSIVE METABOLIC PANEL
ALT: 16 U/L (ref 0–53)
AST: 18 U/L (ref 0–37)
Albumin: 3.9 g/dL (ref 3.5–5.2)
Alkaline Phosphatase: 68 U/L (ref 39–117)
BUN: 17 mg/dL (ref 6–23)
CO2: 29 mEq/L (ref 19–32)
Calcium: 9.5 mg/dL (ref 8.4–10.5)
Chloride: 103 mEq/L (ref 96–112)
Creatinine, Ser: 0.93 mg/dL (ref 0.40–1.50)
GFR: 75.77 mL/min (ref >60.00)
Glucose, Bld: 103 mg/dL — ABNORMAL HIGH (ref 70–99)
Potassium: 4.5 mEq/L (ref 3.5–5.1)
Sodium: 138 mEq/L (ref 135–145)
Total Bilirubin: 0.8 mg/dL (ref 0.2–1.2)
Total Protein: 6.4 g/dL (ref 6.0–8.3)

## 2019-11-01 LAB — TSH: TSH: 4.08 u[IU]/mL (ref 0.35–4.50)

## 2019-11-01 LAB — LDL CHOLESTEROL, DIRECT: Direct LDL: 143 mg/dL

## 2019-11-01 NOTE — Progress Notes (Signed)
Subjective:    Patient ID: Robert Anderson, male    DOB: Jan 13, 1924, 86 y.o.   MRN: [id number]  HPI  Patient presents for yearly preventative medicine examination. 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   Since we last saw him he and his wife have  moved from [place] home to a [place] and he " loves it there".   Aortic Valve Disease - s/p TAVR in 2016. He is managed by Cardiology.   PAD - Rate controlled with BB and chronic anticoagulation with Coumadin.   Complete Heart block - has Psychologist, forensic. Denies symptoms.   Essential Hypertension - controlled with Toprol- XR  H/o Prostate Cancer - is followed by Urology on a routine base.   Hyperlipidemia - currently conservative treatment with fish oil. He doe shave a history of statin intolerance.   All immunizations and health maintenance protocols were reviewed with the patient and needed orders were placed.  Appropriate screening laboratory values were ordered for the patient including screening of hyperlipidemia, renal function and hepatic function.  Medication reconciliation,  past medical history, social history, problem list and allergies were reviewed in detail with the patient  Goals were established with regard to weight loss, exercise, and  diet in compliance with medications. He has an exercise bike in his room and is doing 12 minutes twice  a day.  Wt Readings from Last 3 Encounters:  11/01/19 226 lb (102.5 kg)  07/04/19 224 lb 3.2 oz (101.7 kg)  05/02/19 226 lb 12.8 oz (102.9 kg)   End of life planning was discussed. He has an advanced directive and living will.   He has no acute issues and reports that he is " feeling really good for an old man."   Review of Systems  Constitutional: Negative.   HENT: Positive for hearing loss.   Eyes: Negative.   Respiratory: Negative.   Cardiovascular: Negative.   Gastrointestinal: Negative.   Endocrine: Negative.   Genitourinary: Negative.   Musculoskeletal: Negative.   Skin: Negative.   Allergic/Immunologic: Negative.   Neurological: Negative.   Hematological: Negative.   Psychiatric/Behavioral: Negative.   All other systems reviewed and are negative.  Past Medical History:  Diagnosis Date  . Atrial fibrillation ([place])    a. dx after lead revision 06/26/15 >> anticoag not started due to recent pericardial effusion in setting of perforation and lead revision  . Bradycardia    a. HR 30s in clinic 06/2015 - BB discontinued.  . DEGENERATIVE JOINT DISEASE, SPINE   . ERECTILE DYSFUNCTION   . Essential hypertension   . First degree AV block   . HEARING LOSS   . Heart block    s/p Pacemaker  . Hyperlipidemia   . Hypertrophic obstructive cardiomyopathy ([place])   . LBBB (left bundle branch block)   . OA (osteoarthritis)    both hips  . Pericardial effusion    a. micorpeforation s/p lead revision 06/26/15  . Presence of permanent cardiac pacemaker 06/24/2015   St Jude Assurity  . Prostate cancer ([place]) 2010   "  external radiation; 40 treatments"  . 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 Severe aortic stenosis    a. s/p TAVR 03/2015. Pre-op  LHC 02/2015: minor nonobstructive CAD.  Marland Kitchen Squamous cell skin cancer    left lower leg  . Stroke ([place]) 02/19/2014   Small  infarct high posterior left frontal lobe on MRI   . TIA (transient ischemic attack) 01/21/2014   5 min spell aphasia without assoc sx      Social History   Socioeconomic History  . Marital status: Married    Spouse name: Not on file  . Number of children: 4  . Years of education: Not on file  . Highest education level: Not on file  Occupational History  . Occupation: Retired  Tobacco Use  . Smoking status: Never Smoker  . Smokeless tobacco: Never Used  Substance and Sexual Activity  . Alcohol use: No    Alcohol/week: 0.0 standard drinks  . Drug use: No  . Sexual activity: Not Currently  Other Topics Concern  . Not on file  Social History Narrative   Patient lives in [place] with his wife. 2nd marriage    4 children    He goes to the [facility] three days a week      09/27/18: Lives at [facility] with wife   Still goes to [facility], 2X/week, bicycles/uses sitting eliptical in room daily   Enjoys singing, part of singing group at center   Active in discussion groups at center         Social Determinants of Health   Financial Resource Strain:   . Difficulty of Paying Living Expenses:   Food Insecurity:   . Worried About Charity fundraiser in the Last Year:   . Arboriculturist in the Last Year:   Transportation Needs:   . Film/video editor (Medical):   Marland Kitchen Lack of Transportation (Non-Medical):   Physical Activity:   . Days of Exercise per Week:   . Minutes of Exercise per Session:   Stress:   . Feeling of Stress :   Social Connections:   . Frequency of Communication with Friends and Family:   . Frequency of Social Gatherings with Friends and Family:   . Attends Religious Services:   . Active Member of Clubs or Organizations:   . Attends Archivist Meetings:   Marland Kitchen Marital Status:   Intimate Partner Violence:   . Fear of Current or Ex-Partner:   . Emotionally Abused:   Marland Kitchen Physically Abused:   . Sexually Abused:     Past Surgical History:   Procedure Laterality Date  . ANTERIOR CERVICAL DECOMP/DISCECTOMY FUSION  ~ 2000   Dr. Louanne Skye  . BACK SURGERY    . CARDIAC CATHETERIZATION N/A 02/20/2015   Procedure: Right/Left Heart Cath and Coronary Angiography;  Surgeon: Sherren Mocha, MD;  Location: [place] [facility];  Service: Cardiovascular;  Laterality: N/A;  . CARDIAC VALVE REPLACEMENT    . CATARACT EXTRACTION W/ INTRAOCULAR LENS  IMPLANT, BILATERAL  2004; 2010   right; left  . EP IMPLANTABLE DEVICE N/A 06/24/2015   Procedure: Pacemaker Implant;  Surgeon: Evans Lance, MD;  Location: [place];  Service: Cardiovascular; St Jude Assurity   . EP IMPLANTABLE DEVICE N/A 06/26/2015   Procedure: Lead Revision/Repair;  Surgeon: Will Meredith Leeds, MD;  Location: [facility];  Service:  Cardiovascular;  Laterality: N/A;  . INSERT / REPLACE / REMOVE PACEMAKER  06/24/2015  . JOINT REPLACEMENT    . SQUAMOUS CELL CARCINOMA EXCISION Left X 3   "lower leg"  . TEE WITHOUT CARDIOVERSION N/A 03/07/2015   Procedure: TRANSESOPHAGEAL ECHOCARDIOGRAM (TEE);  Surgeon: Dorothy Spark, MD;  Location: [facility];  Service: Cardiovascular;  Laterality: N/A;  . TEE WITHOUT CARDIOVERSION N/A 03/25/2015   Procedure: TRANSESOPHAGEAL ECHOCARDIOGRAM (TEE);  Surgeon: Burnell Blanks, MD;  Location: [facility];  Service: Open Heart Surgery;  Laterality: N/A;  . TONSILLECTOMY    . TOTAL SHOULDER ARTHROPLASTY Right 2007   "wore it out playing tennis"  . TRANSCATHETER AORTIC VALVE REPLACEMENT, TRANSFEMORAL N/A 03/25/2015   Procedure: TRANSCATHETER AORTIC VALVE REPLACEMENT, TRANSFEMORAL;  Surgeon: Burnell Blanks, MD;  Location: [facility];  Service: Open Heart Surgery;  Laterality: N/A;    Family History  Problem Relation Age of Onset  . Heart disease Mother   . CVA Mother   . Stroke Mother   . CVA Father   . Cancer Father   . Brain cancer Sister   . Prostate cancer Brother   . Heart disease Brother   . Lung cancer Brother   . Heart  attack Brother   . Hypertension Neg Hx     No Known Allergies  Current Outpatient Medications on File Prior to Visit  Medication Sig Dispense Refill  . acetaminophen (TYLENOL) 325 MG tablet Take 1-2 tablets (325-650 mg total) by mouth every 4 (four) hours as needed for mild pain.    Marland Kitchen amoxicillin (AMOXIL) 500 MG capsule TAKE 4 CAPSULES BY MOUTH 1 HOUR BEFORE APPT    . docusate sodium (COLACE) 100 MG capsule Take 300 mg by mouth at bedtime.     . metoprolol succinate (TOPROL-XL) 25 MG 24 hr tablet TAKE 1 TABLET BY MOUTH EVERY DAY 90 tablet 3  . mirabegron ER (MYRBETRIQ) 50 MG TB24 tablet Take 50 mg by mouth daily.    . Multiple Vitamins-Minerals (PRESERVISION AREDS 2+MULTI VIT PO) Take 1 tablet by mouth 2 (two) times daily.    . Omega-3 Fatty Acids (FISH OIL) 1000 MG CAPS Take 1,000 mg by mouth 2 (two) times daily.    Marland Kitchen PREVIDENT 5000 SENSITIVE 1.1-5 % PSTE BRUSH AS DIRECTED    . UNABLE TO FIND Med Name: PRO VISION    . warfarin (COUMADIN) 3 MG tablet TAKE 1 TABLET DAILY AS DIRECTED BY COUMADIN CLINIC 90 tablet 1   No current facility-administered medications on file prior to visit.    BP (!) 144/80   Temp (!) 97.2 F (36.2 C) (Temporal)   Wt 226 lb (102.5 kg)   BMI 33.37 kg/m       Objective:   Physical Exam Vitals and nursing note reviewed.  Constitutional:      General: He is not in acute distress.    Appearance: Normal appearance. He is well-developed and normal weight.  HENT:     Head: Normocephalic and atraumatic.     Right Ear: Tympanic membrane, ear canal and external ear normal. There is no impacted cerumen.     Left Ear: Tympanic membrane, ear canal and external ear normal. There is no impacted cerumen.     Nose: Nose normal. No congestion or rhinorrhea.     Mouth/Throat:     Mouth: Mucous membranes are moist.     Pharynx: Oropharynx is clear. No oropharyngeal exudate or posterior oropharyngeal erythema.  Eyes:     General:  Right eye: No discharge.         Left eye: No discharge.     Extraocular Movements: Extraocular movements intact.     Conjunctiva/sclera: Conjunctivae normal.     Pupils: Pupils are equal, round, and reactive to light.  Neck:     Vascular: No carotid bruit.     Trachea: No tracheal deviation.  Cardiovascular:     Rate and Rhythm: Normal rate and regular rhythm.     Pulses: Normal pulses.     Heart sounds: Normal heart sounds. No murmur. No friction rub. No gallop.   Pulmonary:     Effort: Pulmonary effort is normal. No respiratory distress.     Breath sounds: Normal breath sounds. No stridor. No wheezing, rhonchi or rales.  Chest:     Chest wall: No tenderness.  Abdominal:     General: Bowel sounds are normal. There is no distension.     Palpations: Abdomen is soft. There is no mass.     Tenderness: There is no abdominal tenderness. There is no right CVA tenderness, left CVA tenderness, guarding or rebound.     Hernia: No hernia is present.  Musculoskeletal:        General: No swelling, tenderness, deformity or signs of injury. Normal range of motion.     Right lower leg: No edema.     Left lower leg: No edema.  Lymphadenopathy:     Cervical: No cervical adenopathy.  Skin:    General: Skin is warm and dry.     Capillary Refill: Capillary refill takes less than 2 seconds.     Coloration: Skin is not jaundiced or pale.     Findings: No bruising, erythema, lesion or rash.  Neurological:     General: No focal deficit present.     Mental Status: He is alert and oriented to person, place, and time.     Cranial Nerves: No cranial nerve deficit.     Sensory: No sensory deficit.     Motor: No weakness.     Coordination: Coordination normal.     Gait: Gait abnormal.     Deep Tendon Reflexes: Reflexes normal.     Comments: Walks with a rolling walker  Psychiatric:        Mood and Affect: Mood normal.        Behavior: Behavior normal.        Thought Content: Thought content normal.        Judgment: Judgment  normal.       Assessment & Plan:  1. Routine general medical examination at a health care facility - Remarkable 86 year old male.  - Encouraged to continue with diet and exercise - Follow up in one year or sooner if needed - CBC with Differential/Platelet - Comprehensive metabolic panel - Lipid panel - TSH  2. Essential hypertension - No change in medication regimen - Controlled for age - CBC with Differential/Platelet - Comprehensive metabolic panel - Lipid panel - TSH  3. Hyperlipidemia, unspecified hyperlipidemia type - Consider statin  - CBC with Differential/Platelet - Comprehensive metabolic panel - Lipid panel - TSH  4. Atrial fibrillation, unspecified type ([place]) - Continue BB and coumadin  - CBC with Differential/Platelet - Comprehensive metabolic panel - Lipid panel - TSH  5. Complete heart block (HCC)  - CBC with Differential/Platelet - Comprehensive metabolic panel - Lipid panel - TSH  6. S/P TAVR (transcatheter aortic valve replacement) - Follow up with cardiology as directed - CBC with Differential/Platelet -  Comprehensive metabolic panel - Lipid panel - TSH  Dorothyann Peng, NP   This visit occurred during the SARS-CoV-2 public health emergency.  Safety protocols were in place, including screening questions prior to the visit, additional usage of staff PPE, and extensive cleaning of exam room while observing appropriate contact time as indicated for disinfecting solutions.

## 2019-11-01 NOTE — Patient Instructions (Signed)
It was great seeing you today.   You look great! Keep up the exercise  We will follow up with you regarding your blood work   Please follow up with me in one year or sooner if needed

## 2019-11-02 ENCOUNTER — Other Ambulatory Visit: Payer: Self-pay

## 2019-11-02 ENCOUNTER — Ambulatory Visit (INDEPENDENT_AMBULATORY_CARE_PROVIDER_SITE_OTHER): Payer: Medicare Other | Admitting: Pharmacist

## 2019-11-02 DIAGNOSIS — Z79899 Other long term (current) drug therapy: Secondary | ICD-10-CM | POA: Diagnosis not present

## 2019-11-02 DIAGNOSIS — I639 Cerebral infarction, unspecified: Secondary | ICD-10-CM | POA: Diagnosis not present

## 2019-11-02 DIAGNOSIS — Z952 Presence of prosthetic heart valve: Secondary | ICD-10-CM

## 2019-11-02 DIAGNOSIS — I4891 Unspecified atrial fibrillation: Secondary | ICD-10-CM | POA: Diagnosis not present

## 2019-11-02 DIAGNOSIS — Z5181 Encounter for therapeutic drug level monitoring: Secondary | ICD-10-CM | POA: Diagnosis not present

## 2019-11-02 LAB — POCT INR: INR: 2.6 (ref 2.0–3.0)

## 2019-11-27 ENCOUNTER — Ambulatory Visit: Payer: Medicare Other | Admitting: Cardiology

## 2019-11-27 ENCOUNTER — Other Ambulatory Visit: Payer: Self-pay

## 2019-11-27 ENCOUNTER — Encounter: Payer: Self-pay | Admitting: Cardiology

## 2019-11-27 VITALS — BP 132/80 | HR 63 | Ht 69.0 in | Wt 228.2 lb

## 2019-11-27 DIAGNOSIS — I442 Atrioventricular block, complete: Secondary | ICD-10-CM

## 2019-11-27 DIAGNOSIS — Z952 Presence of prosthetic heart valve: Secondary | ICD-10-CM

## 2019-11-27 DIAGNOSIS — E782 Mixed hyperlipidemia: Secondary | ICD-10-CM

## 2019-11-27 DIAGNOSIS — E785 Hyperlipidemia, unspecified: Secondary | ICD-10-CM

## 2019-11-27 DIAGNOSIS — Z950 Presence of cardiac pacemaker: Secondary | ICD-10-CM

## 2019-11-27 DIAGNOSIS — I10 Essential (primary) hypertension: Secondary | ICD-10-CM | POA: Diagnosis not present

## 2019-11-27 MED ORDER — ROSUVASTATIN CALCIUM 5 MG PO TABS: 5.0000 mg | ORAL_TABLET | Freq: Every day | ORAL | 1 refills | Status: DC

## 2019-11-27 NOTE — Progress Notes (Signed)
Cardiology Office Note    Date:  11/27/2019   ID:  Robert Anderson, DOB [birth year], MRN [id number]  PCP:  Dorothyann Peng, NP  Cardiologist: Ena Dawley, MD EPS: Cristopher Peru, MD  Reason for visit: 6 months follow-up  History of Present Illness:  Robert Anderson is a 86 y.o. 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  Last office visit with Dr. Meda Coffee 10/16/2018 Lopressor was changed to Toprol-XL.  ED echo 2017 normal LVEF 60 to 65%, hypertrophic cardiomyopathy no LV outflow tract gradient was measured, TAVR functioning normally, mean gradient 11 mmHg.  A couple of months ago he had a dizzy spell. Occurred at rest and only lasted 30 seconds The nurse at [facility] felt he was dehydrated. He now is trying to drink 2-3 cups of water and he hasn't had any more dizziness. Denies chest pain, palpitations, edema. Pacer check 03/27/19 Afib 1.6% time and stable. He misses going to the [facility] to do his exercises. Rides an exercise bike for 15 min and walks up and down the hall 4-5 times/day.  Complains of urinary frequency and getting up every hour and a half at night.  Not sure if the Flomax is helping him 3 times a week.  Has not seen his urologist in many years.  11/27/2019 -the patient is coming after 6 months, he has been doing great, he continues to use stationary bike twice a day for 24 minutes a day total.  He denies any chest pain or shortness of breath with that, his episodes of dizziness have resolved, he denies any palpitations syncope or falls.  Past Medical History:  Diagnosis Date  . Atrial fibrillation ([place])    a. dx after lead revision 06/26/15 >> anticoag not started due to recent pericardial effusion in setting of perforation and lead revision  . Bradycardia    a. HR 30s in  clinic 06/2015 - BB discontinued.  . DEGENERATIVE JOINT DISEASE, SPINE   . ERECTILE DYSFUNCTION   . Essential hypertension   . First degree AV block   . HEARING LOSS   . Heart block    s/p Pacemaker  . Hyperlipidemia   . Hypertrophic obstructive cardiomyopathy ([place])   . LBBB (left bundle branch block)   . OA (osteoarthritis)    both hips  . Pericardial effusion    a. micorpeforation s/p lead revision 06/26/15  . Presence of permanent cardiac pacemaker 06/24/2015   St Jude Assurity  . Prostate cancer [facility]) 2010   "external radiation; 40 treatments"  . 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 Severe aortic stenosis    a. s/p TAVR 03/2015. Pre-op  LHC 02/2015: minor nonobstructive CAD.  Marland Kitchen Squamous cell skin cancer    left lower leg  . Stroke ([place]) 02/19/2014   Small infarct high posterior left frontal lobe on MRI   . TIA (transient ischemic attack) 01/21/2014   5 min spell aphasia without assoc sx      Past Surgical History:  Procedure Laterality Date  . ANTERIOR CERVICAL DECOMP/DISCECTOMY FUSION  ~ 2000   Dr. Louanne Skye  . BACK SURGERY    . CARDIAC CATHETERIZATION N/A 02/20/2015   Procedure: Right/Left Heart Cath and Coronary  Angiography;  Surgeon: Sherren Mocha, MD;  Location: [place] [facility];  Service: Cardiovascular;  Laterality: N/A;  . CARDIAC VALVE REPLACEMENT    . CATARACT EXTRACTION W/ INTRAOCULAR LENS  IMPLANT, BILATERAL  2004; 2010   right; left  . EP IMPLANTABLE DEVICE N/A 06/24/2015   Procedure: Pacemaker Implant;  Surgeon: Evans Lance, MD;  Location: [place] [facility];  Service: Cardiovascular; St Jude Assurity   . EP IMPLANTABLE DEVICE N/A 06/26/2015   Procedure: Lead Revision/Repair;  Surgeon: Will Meredith Leeds, MD;  Location: [place] [facility];  Service: Cardiovascular;  Laterality: N/A;  . INSERT / REPLACE / REMOVE PACEMAKER  06/24/2015  . JOINT  REPLACEMENT    . SQUAMOUS CELL CARCINOMA EXCISION Left X 3   "lower leg"  . TEE WITHOUT CARDIOVERSION N/A 03/07/2015   Procedure: TRANSESOPHAGEAL ECHOCARDIOGRAM (TEE);  Surgeon: Dorothy Spark, MD;  Location: [place];  Service: Cardiovascular;  Laterality: N/A;  . TEE WITHOUT CARDIOVERSION N/A 03/25/2015   Procedure: TRANSESOPHAGEAL ECHOCARDIOGRAM (TEE);  Surgeon: Burnell Blanks, MD;  Location: [place];  Service: Open Heart Surgery;  Laterality: N/A;  . TONSILLECTOMY    . TOTAL SHOULDER ARTHROPLASTY Right 2007   "wore it out playing tennis"  . TRANSCATHETER AORTIC VALVE REPLACEMENT, TRANSFEMORAL N/A 03/25/2015   Procedure: TRANSCATHETER AORTIC VALVE REPLACEMENT, TRANSFEMORAL;  Surgeon: Burnell Blanks, MD;  Location: [place];  Service: Open Heart Surgery;  Laterality: N/A;    Current Medications: Current Meds  Medication Sig  . acetaminophen (TYLENOL) 325 MG tablet Take 1-2 tablets (325-650 mg total) by mouth every 4 (four) hours as needed for mild pain.  Marland Kitchen amoxicillin (AMOXIL) 500 MG capsule TAKE 4 CAPSULES BY MOUTH 1 HOUR BEFORE APPT  . docusate sodium (COLACE) 100 MG capsule Take 300 mg by mouth at bedtime.   . metoprolol succinate (TOPROL-XL) 25 MG 24 hr tablet TAKE 1 TABLET BY MOUTH EVERY DAY  . mirabegron ER (MYRBETRIQ) 50 MG TB24 tablet Take 50 mg by mouth daily.  . Multiple Vitamins-Minerals (PRESERVISION AREDS 2+MULTI VIT PO) Take 1 tablet by mouth 2 (two) times daily.  . Omega-3 Fatty Acids (FISH OIL) 1000 MG CAPS Take 1,000 mg by mouth 2 (two) times daily.  Marland Kitchen PREVIDENT 5000 SENSITIVE 1.1-5 % PSTE BRUSH AS DIRECTED  . UNABLE TO FIND Med Name: PRO VISION  . warfarin (COUMADIN) 3 MG tablet TAKE 1 TABLET DAILY AS DIRECTED BY COUMADIN CLINIC     Allergies:   Patient has no known allergies.   Social History   Socioeconomic History  . Marital status: Married    Spouse name: Not on file  . Number of children: 4  . Years of education: Not on file  . Highest  education level: Not on file  Occupational History  . Occupation: Retired  Tobacco Use  . Smoking status: Never Smoker  . Smokeless tobacco: Never Used  Substance and Sexual Activity  . Alcohol use: No    Alcohol/week: 0.0 standard drinks  . Drug use: No  . Sexual activity: Not Currently  Other Topics Concern  . Not on file  Social History Narrative   Patient lives in [place] with his wife. 2nd marriage    4 children    He [facility] to [facility] three days a week      09/27/18: Lives at [facility] with wife   Still goes to [facility], 2X/week, bicycles/uses sitting eliptical in room daily   Enjoys singing, part of singing group at center  Active in discussion groups at center         Social Determinants of Health   Financial Resource Strain:   . Difficulty of Paying Living Expenses:   Food Insecurity:   . Worried About Charity fundraiser in the Last Year:   . Arboriculturist in the Last Year:   Transportation Needs:   . Film/video editor (Medical):   Marland Kitchen Lack of Transportation (Non-Medical):   Physical Activity:   . Days of Exercise per Week:   . Minutes of Exercise per Session:   Stress:   . Feeling of Stress :   Social Connections:   . Frequency of Communication with Friends and Family:   . Frequency of Social Gatherings with Friends and Family:   . Attends Religious Services:   . Active Member of Clubs or Organizations:   . Attends Archivist Meetings:   Marland Kitchen Marital Status:      Family History:  The patient's   family history includes Brain cancer in his sister; CVA in his father and mother; Cancer in his father; Heart attack in his brother; Heart disease in his brother and mother; Lung cancer in his brother; Prostate cancer in his brother; Stroke in his mother.   ROS:   Please see the history of present illness.    ROS All other systems reviewed and are negative.   PHYSICAL EXAM:   VS:  BP 132/80   Pulse 63   Ht 5\' 9"  (1.753 m)   Wt 228  lb 3.2 oz (103.5 kg)   SpO2 93%   BMI 33.70 kg/m   Physical Exam  GEN: Obese, in no acute distress  Neck: no JVD, carotid bruits, or masses Cardiac:RRR; 2/6 systolic murmur at the left sternal border Respiratory:  clear to auscultation bilaterally, normal work of breathing GI: soft, nontender, nondistended, + BS [id number] leg edema without cyanosis, clubbing, or  Good distal pulses bilaterally MS: no deformity or atrophy  Skin: warm and dry, no rash Neuro:  Alert and Oriented x 3, Strength and sensation are intact Psych: euthymic mood, full affect  Wt Readings from Last 3 Encounters:  11/27/19 228 lb 3.2 oz (103.5 kg)  11/01/19 226 lb (102.5 kg)  07/04/19 224 lb 3.2 oz (101.7 kg)      Studies/Labs Reviewed:   EKG:  EKG is not ordered today.    Recent Labs: 11/01/2019: ALT 16; BUN 17; Creatinine, Ser 0.93; Hemoglobin 15.4; Platelets 208.0; Potassium 4.5; Sodium 138; TSH 4.08   Lipid Panel    Component Value Date/Time   CHOL 222 (H) 11/01/2019 1107   TRIG 240.0 (H) 11/01/2019 1107   HDL 38.50 (L) 11/01/2019 1107   CHOLHDL 6 11/01/2019 1107   VLDL 48.0 (H) 11/01/2019 1107   LDLCALC 136 (H) 07/29/2016 0855   LDLDIRECT 143.0 11/01/2019 1107    Additional studies/ records that were reviewed today include:  2D echo 2017Study Conclusions   - Left ventricle: The cavity size was normal. There was moderate   asymmetric septal hypertrophy. Systolic function was normal. The   estimated ejection fraction was in the range of 60% to 65%. Wall   motion was normal; there were no regional wall motion   abnormalities. Doppler parameters are consistent with abnormal   left ventricular relaxation (grade 1 diastolic dysfunction). No   LV outflow tract gradient was measured. - Aortic valve: Bioprosthetic aortic valve s/p transcatheter aortic   valve replacement. The TAVR valve appeared to function normally.  There was no significant regurgitation. Mean gradient (S): 11 mm   Hg. - Mitral  valve: Mild systolic anterior motion of the anterior   mitral leaflet. - Left atrium: The atrium was mildly dilated. - Right ventricle: The cavity size was normal. Systolic function   was normal. - Right atrium: The atrium was mildly dilated. - Pulmonary arteries: No complete TR doppler jet so unable to   estimate PA systolic pressure. - Inferior vena cava: The vessel was normal in size. The   respirophasic diameter changes were in the normal range (>= 50%),   consistent with normal central venous pressure.   Impressions:   - Normal LV size with moderate asymmetric septal hypertrophy. EF   60-65%. Mild systolic anterior motion of the mitral valve. No LV   outflow tract gradient measured. Consistent with hypertrophic   cardiomyopathy. Normal RV size and systolic function. s/p TAVR,   valve appears to function normally.    ASSESSMENT:    1. Essential hypertension   2. S/P TAVR (transcatheter aortic valve replacement)   3. Hyperlipidemia, unspecified hyperlipidemia type   4. Complete heart block ([place])   5. Pacemaker   6. Mixed hyperlipidemia    PLAN:  In order of problems listed above:  Status post TAVR 2016 last echo 2017 functioning normally  Paroxysmal atrial fibrillation on Coumadin and Toprol, he had 1 episode of hematuria, however was checked by urology without any significant finding on abdominal CT, his most recent hemoglobin in March 2021 was 15.4.  Hypertrophic obstructive cardiomyopathy last echo 2017 on beta-blocker, no syncope.  Essential hypertension controlled on current management  Permanent pacemaker followed by Dr. Lovena Le -last seen in December 2020, pacemaker functioning properly.  EKG shows atrial pacing unchanged from prior.  Urinary frequency recommend he see his urologist Dr. Karsten Ro  Hyperlipidemia, although is hesitant to start medication given his age however his LDL is 143 and triglycerides 240, I am going to start a very low-dose of rosuvastatin 5  mg daily.  Medication Adjustments/Labs and Tests Ordered: Current medicines are reviewed at length with the patient today.  Concerns regarding medicines are outlined above.  Medication changes, Labs and Tests ordered today are listed in the Patient Instructions below. Patient Instructions  Medication Instructions:   START TAKING ROSUVASTATIN 5 MG BY MOUTH DAILY  *If you need a refill on your cardiac medications before your next appointment, please call your pharmacy*   Follow-Up: At [facility], you and your health needs are our priority.  As part of our continuing mission to provide you with exceptional heart care, we have created designated Provider Care Teams.  These Care Teams include your primary Cardiologist (physician) and Advanced Practice Providers (APPs -  Physician Assistants and Nurse Practitioners) who all work together to provide you with the care you need, when you need it.  We recommend signing up for the patient portal called "MyChart".  Sign up information is provided on this After Visit Summary.  MyChart is used to connect with patients for Virtual Visits (Telemedicine).  Patients are able to view lab/test results, encounter notes, upcoming appointments, etc.  Non-urgent messages can be sent to your provider as well.   To learn more about what you can do with MyChart, go to [email or web address].    Your next appointment:   6 month(s)  The format for your next appointment:   In Person  Provider:   Ena Dawley, MD       Signed, Ena Dawley, MD  11/27/2019 4:05  PM    [facility] [address], [place], [place]  [address] Phone: [phone]; Fax: [phone]

## 2019-11-27 NOTE — Patient Instructions (Signed)
Medication Instructions:   START TAKING ROSUVASTATIN 5 MG BY MOUTH DAILY  *If you need a refill on your cardiac medications before your next appointment, please call your pharmacy*   Follow-Up: At [facility], you and your health needs are our priority.  As part of our continuing mission to provide you with exceptional heart care, we have created designated Provider Care Teams.  These Care Teams include your primary Cardiologist (physician) and Advanced Practice Providers (APPs -  Physician Assistants and Nurse Practitioners) who all work together to provide you with the care you need, when you need it.  We recommend signing up for the patient portal called "MyChart".  Sign up information is provided on this After Visit Summary.  MyChart is used to connect with patients for Virtual Visits (Telemedicine).  Patients are able to view lab/test results, encounter notes, upcoming appointments, etc.  Non-urgent messages can be sent to your provider as well.   To learn more about what you can do with MyChart, go to [email or web address].    Your next appointment:   6 month(s)  The format for your next appointment:   In Person  Provider:   Ena Dawley, MD

## 2019-12-14 ENCOUNTER — Other Ambulatory Visit: Payer: Self-pay

## 2019-12-14 ENCOUNTER — Ambulatory Visit (INDEPENDENT_AMBULATORY_CARE_PROVIDER_SITE_OTHER): Payer: Medicare Other | Admitting: Pharmacist

## 2019-12-14 DIAGNOSIS — I4891 Unspecified atrial fibrillation: Secondary | ICD-10-CM | POA: Diagnosis not present

## 2019-12-14 DIAGNOSIS — I639 Cerebral infarction, unspecified: Secondary | ICD-10-CM

## 2019-12-14 DIAGNOSIS — Z79899 Other long term (current) drug therapy: Secondary | ICD-10-CM

## 2019-12-14 DIAGNOSIS — Z5181 Encounter for therapeutic drug level monitoring: Secondary | ICD-10-CM

## 2019-12-14 DIAGNOSIS — Z952 Presence of prosthetic heart valve: Secondary | ICD-10-CM

## 2019-12-14 LAB — POCT INR: INR: 3 (ref 2.0–3.0)

## 2019-12-26 ENCOUNTER — Ambulatory Visit (INDEPENDENT_AMBULATORY_CARE_PROVIDER_SITE_OTHER): Payer: Medicare Other | Admitting: *Deleted

## 2019-12-26 DIAGNOSIS — I442 Atrioventricular block, complete: Secondary | ICD-10-CM

## 2019-12-26 DIAGNOSIS — I447 Left bundle-branch block, unspecified: Secondary | ICD-10-CM

## 2019-12-26 LAB — CUP PACEART REMOTE DEVICE CHECK
Battery Remaining Longevity: 110 mo
Battery Remaining Percentage: 95.5 %
Battery Voltage: 2.96 V
Brady Statistic AP VP Percent: 69 %
Brady Statistic AP VS Percent: 1 %
Brady Statistic AS VP Percent: 30 %
Brady Statistic AS VS Percent: 1 %
Brady Statistic RA Percent Paced: 68 %
Brady Statistic RV Percent Paced: 99 %
Date Time Interrogation Session: 20210511020015
Implantable Lead Implant Date: 20161108
Implantable Lead Implant Date: 20161108
Implantable Lead Location: 753859
Implantable Lead Location: 753860
Implantable Pulse Generator Implant Date: 20161108
Lead Channel Impedance Value: 440 Ohm
Lead Channel Impedance Value: 450 Ohm
Lead Channel Pacing Threshold Amplitude: 0.625 V
Lead Channel Pacing Threshold Amplitude: 0.75 V
Lead Channel Pacing Threshold Pulse Width: 0.5 ms
Lead Channel Pacing Threshold Pulse Width: 0.5 ms
Lead Channel Sensing Intrinsic Amplitude: 12 mV
Lead Channel Sensing Intrinsic Amplitude: 5 mV
Lead Channel Setting Pacing Amplitude: 0.875
Lead Channel Setting Pacing Amplitude: 2 V
Lead Channel Setting Pacing Pulse Width: 0.5 ms
Lead Channel Setting Sensing Sensitivity: 5 mV
Pulse Gen Model: 2240
Pulse Gen Serial Number: 7825693

## 2019-12-28 NOTE — Progress Notes (Signed)
Remote pacemaker transmission.   

## 2020-01-21 ENCOUNTER — Other Ambulatory Visit: Payer: Self-pay | Admitting: Cardiology

## 2020-01-25 ENCOUNTER — Other Ambulatory Visit: Payer: Self-pay

## 2020-01-25 ENCOUNTER — Ambulatory Visit (INDEPENDENT_AMBULATORY_CARE_PROVIDER_SITE_OTHER): Payer: Medicare Other | Admitting: Pharmacist

## 2020-01-25 DIAGNOSIS — Z5181 Encounter for therapeutic drug level monitoring: Secondary | ICD-10-CM | POA: Diagnosis not present

## 2020-01-25 DIAGNOSIS — Z79899 Other long term (current) drug therapy: Secondary | ICD-10-CM | POA: Diagnosis not present

## 2020-01-25 DIAGNOSIS — Z952 Presence of prosthetic heart valve: Secondary | ICD-10-CM

## 2020-01-25 DIAGNOSIS — I4891 Unspecified atrial fibrillation: Secondary | ICD-10-CM

## 2020-01-25 LAB — POCT INR: INR: 2.9 (ref 2.0–3.0)

## 2020-03-05 ENCOUNTER — Ambulatory Visit (INDEPENDENT_AMBULATORY_CARE_PROVIDER_SITE_OTHER): Payer: Medicare Other

## 2020-03-05 ENCOUNTER — Other Ambulatory Visit: Payer: Self-pay

## 2020-03-05 DIAGNOSIS — Z79899 Other long term (current) drug therapy: Secondary | ICD-10-CM | POA: Diagnosis not present

## 2020-03-05 DIAGNOSIS — Z5181 Encounter for therapeutic drug level monitoring: Secondary | ICD-10-CM

## 2020-03-05 DIAGNOSIS — Z952 Presence of prosthetic heart valve: Secondary | ICD-10-CM

## 2020-03-05 LAB — POCT INR: INR: 3.8 — AB (ref 2.0–3.0)

## 2020-03-05 NOTE — Patient Instructions (Signed)
Hold today and then continue taking 1 tablet daily except 1/2 tablet each Wednesday,  Repeat INR in 4 weeks

## 2020-03-26 ENCOUNTER — Ambulatory Visit (INDEPENDENT_AMBULATORY_CARE_PROVIDER_SITE_OTHER): Payer: Medicare Other | Admitting: *Deleted

## 2020-03-26 DIAGNOSIS — I4891 Unspecified atrial fibrillation: Secondary | ICD-10-CM

## 2020-03-27 LAB — CUP PACEART REMOTE DEVICE CHECK
Battery Remaining Longevity: 109 mo
Battery Remaining Percentage: 95.5 %
Battery Voltage: 2.96 V
Brady Statistic AP VP Percent: 69 %
Brady Statistic AP VS Percent: 1 %
Brady Statistic AS VP Percent: 31 %
Brady Statistic AS VS Percent: 1 %
Brady Statistic RA Percent Paced: 68 %
Brady Statistic RV Percent Paced: 99 %
Date Time Interrogation Session: 20210810020013
Implantable Lead Implant Date: 20161108
Implantable Lead Implant Date: 20161108
Implantable Lead Location: 753859
Implantable Lead Location: 753860
Implantable Pulse Generator Implant Date: 20161108
Lead Channel Impedance Value: 430 Ohm
Lead Channel Impedance Value: 460 Ohm
Lead Channel Pacing Threshold Amplitude: 0.625 V
Lead Channel Pacing Threshold Amplitude: 0.75 V
Lead Channel Pacing Threshold Pulse Width: 0.5 ms
Lead Channel Pacing Threshold Pulse Width: 0.5 ms
Lead Channel Sensing Intrinsic Amplitude: 12 mV
Lead Channel Sensing Intrinsic Amplitude: 5 mV
Lead Channel Setting Pacing Amplitude: 0.875
Lead Channel Setting Pacing Amplitude: 2 V
Lead Channel Setting Pacing Pulse Width: 0.5 ms
Lead Channel Setting Sensing Sensitivity: 5 mV
Pulse Gen Model: 2240
Pulse Gen Serial Number: 7825693

## 2020-03-31 NOTE — Progress Notes (Signed)
Remote pacemaker transmission.   

## 2020-04-02 ENCOUNTER — Ambulatory Visit (INDEPENDENT_AMBULATORY_CARE_PROVIDER_SITE_OTHER): Payer: Medicare Other

## 2020-04-02 ENCOUNTER — Other Ambulatory Visit: Payer: Self-pay

## 2020-04-02 DIAGNOSIS — Z952 Presence of prosthetic heart valve: Secondary | ICD-10-CM

## 2020-04-02 DIAGNOSIS — Z79899 Other long term (current) drug therapy: Secondary | ICD-10-CM

## 2020-04-02 DIAGNOSIS — Z5181 Encounter for therapeutic drug level monitoring: Secondary | ICD-10-CM

## 2020-04-02 LAB — POCT INR: INR: 3.5 — AB (ref 2.0–3.0)

## 2020-04-02 NOTE — Patient Instructions (Signed)
Hold today and then continue taking 1 tablet daily except 1/2 tablet each Wednesday and Saturday,  Repeat INR in 1 weeks.

## 2020-04-11 ENCOUNTER — Encounter: Payer: Self-pay | Admitting: Pharmacist

## 2020-04-11 ENCOUNTER — Ambulatory Visit (INDEPENDENT_AMBULATORY_CARE_PROVIDER_SITE_OTHER): Payer: Medicare Other | Admitting: Pharmacist

## 2020-04-11 ENCOUNTER — Other Ambulatory Visit: Payer: Self-pay

## 2020-04-11 DIAGNOSIS — Z5181 Encounter for therapeutic drug level monitoring: Secondary | ICD-10-CM

## 2020-04-11 DIAGNOSIS — I4891 Unspecified atrial fibrillation: Secondary | ICD-10-CM

## 2020-04-11 DIAGNOSIS — Z79899 Other long term (current) drug therapy: Secondary | ICD-10-CM

## 2020-04-11 DIAGNOSIS — Z952 Presence of prosthetic heart valve: Secondary | ICD-10-CM

## 2020-04-11 DIAGNOSIS — I4811 Longstanding persistent atrial fibrillation: Secondary | ICD-10-CM

## 2020-04-11 LAB — POCT INR: INR: 2.3 (ref 2.0–3.0)

## 2020-04-11 NOTE — Patient Instructions (Addendum)
Description   Continue taking 1 tablet daily except 1/2 tablet each Wednesday and Saturday,  Repeat INR in 4 weeks.

## 2020-04-11 NOTE — Progress Notes (Unsigned)
INR result faxed to [facility] [phone]

## 2020-04-11 NOTE — Progress Notes (Signed)
Patient with blood in urine from unknown cause.  Seeing Dr Harold Barban at [facility].  [phone].  Will fax INR results to [phone]

## 2020-04-14 ENCOUNTER — Telehealth: Payer: Self-pay | Admitting: Cardiology

## 2020-04-14 NOTE — Telephone Encounter (Signed)
Dr. Milford Cage with [facility] is requesting to speak with clinical staff regarding symptoms. He reports patient is experiencing blood in his urine.

## 2020-04-14 NOTE — Telephone Encounter (Signed)
DOD-discussion with Dr. Milford Cage regarding Pt with blood in urine.  Dr. Quentin Ore advised Dr. Milford Cage it would be ok to stop blood thinner for a few days up to a week as needed for possible surgical treatment for hematuria.  No further action needed.

## 2020-04-15 ENCOUNTER — Other Ambulatory Visit: Payer: Self-pay | Admitting: Urology

## 2020-04-16 NOTE — Progress Notes (Signed)
DUE TO COVID-19 ONLY ONE VISITOR IS ALLOWED TO COME WITH YOU AND STAY IN THE WAITING ROOM ONLY DURING PRE OP AND PROCEDURE DAY OF SURGERY. THE 1 VISITOR  MAY VISIT WITH YOU AFTER SURGERY IN YOUR PRIVATE ROOM DURING VISITING HOURS ONLY!  YOU NEED TO HAVE A COVID 19 TEST ON__9/4/21  99/7/21_____ @_______ , THIS TEST MUST BE DONE BEFORE SURGERY,  COVID TESTING SITE [address] [place] [address], IT IS ON THE RIGHT GOING OUT [address] APPROXIMATELY  2 MINUTES PAST [employer] ON THE RIGHT. ONCE YOUR COVID TEST IS COMPLETED,  PLEASE BEGIN THE QUARANTINE INSTRUCTIONS AS OUTLINED IN YOUR HANDOUT.                Robert Anderson  04/16/2020   Your procedure is scheduled on   04/22/20:    Report to [facility] Main  Entrance   Report to admitting at            1145 aM     Call this number if you have problems the morning of surgery [phone]    Remember: Do not eat food , candy gum or mints :After Midnight. You may have clear liquids from midnight until     1145am    CLEAR LIQUID DIET   Foods Allowed                                                                       Coffee and tea, regular and decaf                              Plain Jell-O any favor except red or purple                                            Fruit ices (not with fruit pulp)                                      Iced Popsicles                                     Carbonated beverages, regular and diet                                    Cranberry, grape and apple juices Sports drinks like Gatorade Lightly seasoned clear broth or consume(fat free) Sugar, honey syrup   _____________________________________________________________________    BRUSH YOUR TEETH MORNING OF SURGERY AND RINSE YOUR MOUTH OUT, NO CHEWING GUM CANDY OR MINTS.     Take these medicines the morning of surgery with A SIP OF WATER:        Toprol  DO NOT TAKE ANY DIABETIC MEDICATIONS DAY OF YOUR SURGERY  You may not have any metal on your body including hair pins and              piercings  Do not wear jewelry, make-up, lotions, powders or perfumes, deodorant             Do not wear nail polish on your fingernails.  Do not shave  48 hours prior to surgery.              Men may shave face and neck.   Do not bring valuables to the hospital. [place].  Contacts, dentures or bridgework may not be worn into surgery.  Leave suitcase in the car. After surgery it may be brought to your room.     Patients discharged the day of surgery will not be allowed to drive home. IF YOU ARE HAVING SURGERY AND GOING HOME THE SAME DAY, YOU MUST HAVE AN ADULT TO DRIVE YOU HOME AND BE WITH YOU FOR 24 HOURS. YOU MAY GO HOME BY TAXI OR UBER OR ORTHERWISE, BUT AN ADULT MUST ACCOMPANY YOU HOME AND STAY WITH YOU FOR 24 HOURS.  Name and phone number of your driver:  Special Instructions: N/A              Please read over the following fact sheets you were given: _____________________________________________________________________  [facility] - Preparing for Surgery Before surgery, you can play an important role.  Because skin is not sterile, your skin needs to be as free of germs as possible.  You can reduce the number of germs on your skin by washing with CHG (chlorahexidine gluconate) soap before surgery.  CHG is an antiseptic cleaner which kills germs and bonds with the skin to continue killing germs even after washing. Please DO NOT use if you have an allergy to CHG or antibacterial soaps.  If your skin becomes reddened/irritated stop using the CHG and inform your nurse when you arrive at Short Stay. Do not shave (including legs and underarms) for at least 48 hours prior to the first CHG shower.  You may shave your face/neck. Please follow these instructions carefully:  1.  Shower with CHG Soap the night before surgery and the  morning of  Surgery.  2.  If you choose to wash your hair, wash your hair first as usual with your  normal  shampoo.  3.  After you shampoo, rinse your hair and body thoroughly to remove the  shampoo.                           4.  Use CHG as you would any other liquid soap.  You can apply chg directly  to the skin and wash                       Gently with a scrungie or clean washcloth.  5.  Apply the CHG Soap to your body ONLY FROM THE NECK DOWN.   Do not use on face/ open                           Wound or open sores. Avoid contact with eyes, ears mouth and genitals (private parts).  Wash face,  Genitals (private parts) with your normal soap.             6.  Wash thoroughly, paying special attention to the area where your surgery  will be performed.  7.  Thoroughly rinse your body with warm water from the neck down.  8.  DO NOT shower/wash with your normal soap after using and rinsing off  the CHG Soap.                9.  Pat yourself dry with a clean towel.            10.  Wear clean pajamas.            11.  Place clean sheets on your bed the night of your first shower and do not  sleep with pets. Day of Surgery : Do not apply any lotions/deodorants the morning of surgery.  Please wear clean clothes to the hospital/surgery center.  FAILURE TO FOLLOW THESE INSTRUCTIONS MAY RESULT IN THE CANCELLATION OF YOUR SURGERY PATIENT SIGNATURE_________________________________  NURSE SIGNATURE__________________________________  ________________________________________________________________________

## 2020-04-17 ENCOUNTER — Encounter (HOSPITAL_COMMUNITY)
Admission: RE | Admit: 2020-04-17 | Discharge: 2020-04-17 | Disposition: A | Discharge status: Home / Self Care | Payer: Medicare Other | Source: Ambulatory Visit | Attending: Urology | Admitting: Urology

## 2020-04-17 ENCOUNTER — Encounter (HOSPITAL_COMMUNITY): Payer: Self-pay

## 2020-04-17 ENCOUNTER — Other Ambulatory Visit: Payer: Self-pay

## 2020-04-17 DIAGNOSIS — Z01812 Encounter for preprocedural laboratory examination: Secondary | ICD-10-CM | POA: Insufficient documentation

## 2020-04-17 LAB — BASIC METABOLIC PANEL
Anion gap: 10 (ref 5–15)
BUN: 17 mg/dL (ref 8–23)
CO2: 27 mmol/L (ref 22–32)
Calcium: 9.7 mg/dL (ref 8.9–10.3)
Chloride: 105 mmol/L (ref 98–111)
Creatinine, Ser: 0.84 mg/dL (ref 0.61–1.24)
GFR calc Af Amer: >60 mL/min (ref >60)
GFR calc non Af Amer: >60 mL/min (ref >60)
Glucose, Bld: 100 mg/dL — ABNORMAL HIGH (ref 70–99)
Potassium: 4.6 mmol/L (ref 3.5–5.1)
Sodium: 142 mmol/L (ref 135–145)

## 2020-04-17 LAB — CBC
HCT: 40.9 % (ref 39.0–52.0)
Hemoglobin: 13.1 g/dL (ref 13.0–17.0)
MCH: 29.7 pg (ref 26.0–34.0)
MCHC: 32 g/dL (ref 30.0–36.0)
MCV: 92.7 fL (ref 80.0–100.0)
Platelets: 186 10*3/uL (ref 150–400)
RBC: 4.41 MIL/uL (ref 4.22–5.81)
RDW: 13.2 % (ref 11.5–15.5)
WBC: 7 10*3/uL (ref 4.0–10.5)
nRBC: 0 % (ref 0.0–0.2)

## 2020-04-17 NOTE — Progress Notes (Signed)
Anesthesia Chart Review   Case: [id number] Date/Time: 04/22/20 1330   Procedure: CYSTOSCOPY WITH RETROGRADE PYELOGRAM/FULGERATION (Bilateral )   Anesthesia type: General   Pre-op diagnosis: GROSS HEMATURIA   Location: [facility] ROOM 03 / [facility]   Surgeons: Remi Haggard, MD      DISCUSSION:86 y.o. never smoker with h/o HTN, Stroke, s/p TAVR 2016, LBBB, A-fib (Coumadin last dose 04/14/2020), pacemaker in place due to symptomatic heart block (last device check 03/25/2020, device orders requested), gross hematuria scheduled for above procedure 04/22/2020 with Dr. Harold Barban.   Pt last seen by cardiology 11/27/2019, stable at this visit.  6 month follow up recommended.  Echo 2017 normal LVEF 60 to 65%, hypertrophic cardiomyopathy no LV outflow tract gradient was measured, TAVR functioning normally, mean gradient 11 mmHg.  Anticipate pt can proceed with planned procedure barring acute status change.   VS: BP 123/64   Pulse 68   Temp 36.8 C (Oral)   Resp 18   Ht 5\' 9"  (1.753 m)   Wt 102.5 kg   SpO2 98%   BMI 33.37 kg/m   PROVIDERS: Dorothyann Peng, NP is PCP   Ena Dawley, MD is Cardiologist  LABS: Labs reviewed: Acceptable for surgery. (all labs ordered are listed, but only abnormal results are displayed)  Labs Reviewed  BASIC METABOLIC PANEL - Abnormal; Notable for the following components:      Result Value   Glucose, Bld 100 (*)    All other components within normal limits  CBC     IMAGES:   EKG: 11/27/2019 Rate 60  A-paced rhythm   CV: Echo 04/08/2016 Study Conclusions   - Left ventricle: The cavity size was normal. There was moderate  asymmetric septal hypertrophy. Systolic function was normal. The  estimated ejection fraction was in the range of 60% to 65%. Wall  motion was normal; there were no regional wall motion  abnormalities. Doppler parameters are consistent with abnormal  left ventricular relaxation (grade 1 diastolic dysfunction). No  LV  outflow tract gradient was measured.  - Aortic valve: Bioprosthetic aortic valve s/p transcatheter aortic  valve replacement. The TAVR valve appeared to function normally.  There was no significant regurgitation. Mean gradient (S): 11 mm  Hg.  - Mitral valve: Mild systolic anterior motion of the anterior  mitral leaflet.  - Left atrium: The atrium was mildly dilated.  - Right ventricle: The cavity size was normal. Systolic function  was normal.  - Right atrium: The atrium was mildly dilated.  - Pulmonary arteries: No complete TR doppler jet so unable to  estimate PA systolic pressure.  - Inferior vena cava: The vessel was normal in size. The  respirophasic diameter changes were in the normal range (>= 50%),  consistent with normal central venous pressure  Cardiac Cath 02/20/2015 FINAL CONCLUSIONS:  Minor nonobstructive coronary artery disease  Known severe aortic stenosis with a heavily calcified aortic valve and restricted aortic valve leaflet motion on plain fluoroscopy  Normal right heart hemodynamics  Past Medical History:  Diagnosis Date  . Atrial fibrillation ([place])    a. dx after lead revision 06/26/15 >> anticoag not started due to recent pericardial effusion in setting of perforation and lead revision  . Bradycardia    a. HR 30s in clinic 06/2015 - BB discontinued.  . DEGENERATIVE JOINT DISEASE, SPINE   . ERECTILE DYSFUNCTION   . Essential hypertension   . First degree AV block   . HEARING LOSS   . Heart block  s/p Pacemaker  . Hyperlipidemia   . Hypertrophic obstructive cardiomyopathy ([place])   . LBBB (left bundle branch block)   . OA (osteoarthritis)    both hips  . Pericardial effusion    a. micorpeforation s/p lead revision 06/26/15  . Presence of permanent cardiac pacemaker 06/24/2015   St Jude Assurity  . Prostate cancer [facility]) 2010   "external radiation; 40 treatments"  . 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 Severe aortic stenosis    a. s/p TAVR 03/2015. Pre-op  LHC 02/2015: minor nonobstructive CAD.  Marland Kitchen Squamous cell skin cancer    left lower leg  . Stroke ([place]) 02/19/2014   Small infarct high posterior left frontal lobe on MRI   . TIA (transient ischemic attack) 01/21/2014   5 min spell aphasia without assoc sx      Past Surgical History:  Procedure Laterality Date  . ANTERIOR CERVICAL DECOMP/DISCECTOMY FUSION  ~ 2000   Dr. Louanne Skye  . BACK SURGERY    . CARDIAC CATHETERIZATION N/A 02/20/2015   Procedure: Right/Left Heart Cath and Coronary Angiography;  Surgeon: Sherren Mocha, MD;  Location: [place] [facility];  Service: Cardiovascular;  Laterality: N/A;  . CARDIAC VALVE REPLACEMENT    . CATARACT EXTRACTION W/ INTRAOCULAR LENS  IMPLANT, BILATERAL  2004; 2010   right; left  . EP IMPLANTABLE DEVICE N/A 06/24/2015   Procedure: Pacemaker Implant;  Surgeon: Evans Lance, MD;  Location: [place] [facility];  Service: Cardiovascular; St Jude Assurity   . EP IMPLANTABLE DEVICE N/A 06/26/2015   Procedure: Lead Revision/Repair;  Surgeon: Will Meredith Leeds, MD;  Location: [place] [facility];  Service: Cardiovascular;  Laterality: N/A;  . INSERT / REPLACE / REMOVE PACEMAKER  06/24/2015  . JOINT REPLACEMENT    . SQUAMOUS CELL CARCINOMA EXCISION Left X 3   "lower leg"  . TEE WITHOUT CARDIOVERSION N/A 03/07/2015   Procedure: TRANSESOPHAGEAL ECHOCARDIOGRAM (TEE);  Surgeon: Dorothy Spark, MD;  Location: [place];  Service: Cardiovascular;  Laterality: N/A;  . TEE WITHOUT CARDIOVERSION N/A 03/25/2015   Procedure: TRANSESOPHAGEAL ECHOCARDIOGRAM (TEE);  Surgeon: Burnell Blanks, MD;  Location: [place];  Service: Open Heart Surgery;  Laterality: N/A;  . TONSILLECTOMY    . TOTAL SHOULDER ARTHROPLASTY Right 2007   "wore it out playing tennis"  . TRANSCATHETER AORTIC VALVE REPLACEMENT, TRANSFEMORAL N/A 03/25/2015    Procedure: TRANSCATHETER AORTIC VALVE REPLACEMENT, TRANSFEMORAL;  Surgeon: Burnell Blanks, MD;  Location: [place];  Service: Open Heart Surgery;  Laterality: N/A;    MEDICATIONS: . amoxicillin (AMOXIL) 500 MG capsule  . docusate sodium (COLACE) 100 MG capsule  . metoprolol succinate (TOPROL-XL) 25 MG 24 hr tablet  . mirabegron ER (MYRBETRIQ) 50 MG TB24 tablet  . Multiple Vitamins-Minerals (PRESERVISION AREDS 2+MULTI VIT PO)  . Omega-3 Fatty Acids (FISH OIL) 1000 MG CAPS  . PREVIDENT 5000 SENSITIVE 1.1-5 % PSTE  . rosuvastatin (CRESTOR) 5 MG tablet  . tamsulosin (FLOMAX) 0.4 MG CAPS capsule  . warfarin (COUMADIN) 3 MG tablet   No current facility-administered medications for this encounter.    Konrad Felix, PA-C [facility] [phone]

## 2020-04-17 NOTE — Progress Notes (Addendum)
Anesthesia Review:  QVZ:DGLO nafziger, NP LOV- 11/01/2019   Cardiologist:   DR Ena Dawley - LOV- 11/27/19  Hx of AVR- 2016  , pacemaker, atrial fib , htn  Date of last device check - 03/25/20  Chest x-ray : EKG : 11/27/19  Echo :2017  Stress test: Cardiac Cath :2016   Activity level: Uses Walker lives at [facility] with wife in [place] Study/ CPAP :no Fasting Blood Sugar :      / Checks Blood Sugar -- times a day:no   Blood Thinner/ Instructions /Last Dose: Coumadin - patient's last dose was on 04/14/20 due to hematuria  ASA / Instructions/ Last Dose :  [facility]

## 2020-04-18 NOTE — H&P (Signed)
Robert Anderson is a 86 year-old male established patient who is here for blood in the urine.   10/19/2019: Here today for evaluation of gross hematuria. He is on coumadin for hx of aortic valve replacement, Afib. His last INR was 3.3.   Approximately 2 weeks ago the patient noted painless passage of hematuria with clot material that cleared with next void only to reoccur last night. Denies any visible blood or painful/burning urination today. He does have microscopic hematuria on exam. Voiding symptoms are stable, not worsening. He continues tamsulosin and Myrbetriq. He tells me his frequency and nocturia are better controlled with taking the Myrbetriq. No progressively worsening ability to start his stream. He denies any associated lower back or flank pain/discomfort. He denies past history of kidney stones.   02/15/2020: Under cystoscopic evaluation at time of last office visit he was found to have a moderate bulbous urethral stricture. This was passively dilated using the cystoscope. This plus exam findings consistent with past radiation therapy in the bladder thought to be the source of his hematuria. PRN f/u was recommended. Of note, the patient remains on Warfarin for past hx of Afib, pacemaker, AVR. Last INR was 2.9.   Pt has been having intermittent passage of hematuria for the past two weeks. Sometimes associated with some mild burning and stinging with voiding. He also notes having worsening urinary frequency and urgency as well as nocturia despite ongoing use of Myrbetriq and tamsulosin. This has been progressively worsening over the past several months. Not associated with f/c, n/v, new or worsening flank/lower back pain or discomfort.   Patient tells me he has not been taking tamsulosin. He does have some increased hesitancy, mild straining before initiating stream but states stream feels appropriate. He denies any splitting or spraying of his stream. He does mention to me he has been doing  exercises at home in the form of abdominal crunch is as well as riding a stationary bike for 10-15 minutes at a time a few times per day.  -02/19/20-patient is a 86 year old white male with history of prostate cancer diagnosed in March 2010. Underwent radiation therapy completed 03/07/2009. Previously followed by Dr. Karsten Ro. Patient has had some issues with recurrence intermittent hematuria. Was seen by Dr. Karsten Ro back in March 2021 and cysto that time revealed a bulbous stricture which was dilated by passing of the scope. Bladder was described as having no obvious lesions of the cyst than some hypervascularity in the bladder. Patient also had CT scan on 10/24/2019 showed essentially normal upper urinary tract. Patient was recently seen by their Noberto Retort and was noted to have some microscopic hematuria with history of recurring gross hematuria. Schedule for follow-up here.  -03/31/20-patient with history of prostate cancer status post radiation therapy completed in 2010 as above. Had some recurrent episodes of hematuria and underwent cysto on 02/19/2020 showing some friable blood vessels at the bladder neck but otherwise normal bladder. In the interim the patient has had some recurrent episodes of hematuria. Blood became worse over the weekend and he has had some associated dysuria as well. Denies any fever or flank pain. Remains on Coumadin. Urine today is grossly bloody..  I attempted flexible cysto today and saw no active bleeding within the prostatic fossa. Urine in the bladder made visualization difficult and I could not delineate an obvious source. Ureteral orifices were not visualized.  -04/07/20-patient with history of prostate cancer status post radiation therapy completed in 2010 as above. Has had recurrent bouts of hematuria  and at last clinic visit on 03/31/2020 underwent cysto which was inconclusive on the actual source of hematuria but visualization was poor due to the hematuria. His PT level was up  with an INR of 3. I attempted several times to contact the patient was unable to get him by phone. In the interim the patient had followed up with his primary care physician and they have modified his Coumadin dose to reduce it. He is scheduled undergo repeat PT checked later in the week. In the meantime he has had some some intermittent gross hematuria which is fairly persistent currently. He did have a few days where cleared over the last week. He is not passing any significant clots.   -04/14/20-patient with history of adenocarcinoma the prostate status post radiation therapy completed in 2010 as above. Had some recent hematuria unclear etiology and Coumadin level was too high. This has been modified by PCP over the last several days to try to get a more normal INR for Coumadin. Over the last several days he has noted continued gross painless hematuria. He has not passing clots and is voiding satisfactorily.  -04/18/20-patient with history of adenocarcinoma the prostate status post external beam radiation therapy completed in 2010. Has had some continued gross painless hematuria. Cysto was nondiagnostic in the office due to poor visualization. He has been off his Coumadin now for 5 days. He states that hematuria is now resolved since stopping the Coumadin.     ALLERGIES: No Allergies    MEDICATIONS: Metoprolol Succinate  Myrbetriq 50 mg tablet, extended release 24 hr 1 tablet PO Daily  Warfarin Sodium 3 mg tablet  Acetaminophen  Colace  Docusate Sodium 100 mg capsule  Fish Oil  Preservision Areds  Prevident  Rosuvastatin Calcium 5 mg tablet     GU PSH: Cysto Dilate Stricture (M or F) - 02/19/2020, 10/30/2019 Cystoscopy - 03/31/2020 Locm 300-399Mg /Ml Iodine,1Ml - 10/24/2019       PSH Notes: Radiation Therapy, Shoulder Surgery, Neck Surgery, Cataract Surgery   NON-GU PSH: Neck Surgery (Unspecified) Pacemaker placement Shoulder Surgery (Unspecified)     GU PMH: Gross hematuria (Stable) -  04/14/2020, (Stable), - 04/07/2020, - 02/15/2020, It appears his gross hematuria was either secondary to a mild bulbar urethral stricture identified or possibly from some superficial vessels noted within the bladder consistent with radiation changes., - 10/30/2019, - 10/19/2019 Urinary Frequency - 02/19/2020 Urinary Hesitancy - 02/19/2020 Nocturia (Stable) - 02/15/2020, He has nocturia with no significant lower extremity swelling noted on exam. His PVR was 0 so I am going to give him Myrbetriq 25 mg samples and see so he will contact me for a prescription., - 05/21/2019 Urinary Urgency - 02/15/2020 Bulbar urethral stricture, He was found to have a mild bulbar urethral stricture that I was able to dilate with the cystoscope today. I told him he may have some initial hematuria the next time were to when he urinates. - 10/30/2019 History of prostate cancer - 10/19/2019, (Stable), He has a history of prostate cancer. PSA fell to nearly undetectable it has been 10 years now., - 05/21/2019, There was no evidence of prostate cancer on his CT scan done on 07/06/15. His PSA at 0.07 is consistent with adequately treated prostate cancer., - 2017 Left testicular pain, His left testicular pain with bleeding and I have told him that I did not feel that this was in any way related to prostate cancer. The pain has resolved. - 2017 Overactive bladder, He has symptoms of bladder overactivity likely  secondary to the effects of radiation. I told him to remain on the tamsulosin and I was going to add Myrbetriq 25 mg to see if he notes improvement. If he does he will contact me for further description. 2015/12/06 BPH w/LUTS, Benign prostatic hyperplasia with urinary obstruction - Dec 05, 2012 ED due to arterial insufficiency, Erectile dysfunction due to arterial insufficiency - 12/05/12 History of urolithiasis, Nephrolithiasis - 05-Dec-2012 Inflammatory Disease Prostate, Unspec, Prostatitis - 2012-12-05 Primary hypogonadism, Hypogonadism, testicular - 12/05/12      PMH  Notes: He has adenocarcinoma of the prostate that was diagnosed after his PSA was found to have elevated to 7.28. His PSA had been slowly rising over the years. Despite a benign feeling prostate I did recommended transrectal ultrasound and biopsy which was performed on 11/07/08 and was found to be positive for adenocarcinoma. There was a single core that contained Gleason 4+3 = 7 with the remaining cores showing a 3+4 and 3+3 pattern. A total of 6/12 cores were positive. He was treated with external beam radiation therapy which he completed on 03/07/09.    He has been seen in the past by Dr. Leory Plowman and was treated for prostatitis as well as hypogonadism with testosterone replacement. He is not currently on any testosterone replacement therapy.    He also had erectile dysfunction that was treated by him as well.     NON-GU PMH: Pyuria/other UA findings - 03/31/2020 Personal history of other diseases of the circulatory system, History of cardiac disorder - Dec 05, 2012, History of hypertension, - 12/05/2012 Personal history of other diseases of the nervous system and sense organs, History of sleep apnea - 05-Dec-2012 Personal history of other infectious and parasitic diseases, History of hepatitis - 12-05-2012    FAMILY HISTORY: Death - Mother, Father Family Health Status Number - Runs In Family Lung Cancer - Brother Prostate Cancer - Brother Stroke Syndrome - Mother, Father   SOCIAL HISTORY: Marital Status: Married Preferred Language: English; Ethnicity: Not Hispanic Or Latino; Race: White Current Smoking Status: Patient has never smoked.  Has never drank.  Drinks 1 caffeinated drink per day.     Notes: Never A Smoker, Caffeine Use, Marital History - Currently Married, Alcohol Use, Previous History Of Smoking   REVIEW OF SYSTEMS:    GU Review Male:   Patient reports burning/ pain with urination. Patient denies frequent urination, hard to postpone urination, get up at night to urinate, leakage of urine, stream starts  and stops, trouble starting your stream, have to strain to urinate , erection problems, and penile pain.  Gastrointestinal (Upper):   Patient denies nausea, vomiting, and indigestion/ heartburn.  Gastrointestinal (Lower):   Patient denies diarrhea and constipation.  Constitutional:   Patient denies weight loss, fever, fatigue, and night sweats.  Skin:   Patient denies skin rash/ lesion and itching.  Eyes:   Patient denies blurred vision and double vision.  Ears/ Nose/ Throat:   Patient denies sore throat and sinus problems.  Hematologic/Lymphatic:   Patient denies swollen glands and easy bruising.  Cardiovascular:   Patient denies leg swelling and chest pains.  Respiratory:   Patient denies cough and shortness of breath.  Endocrine:   Patient denies excessive thirst.  Musculoskeletal:   Patient denies back pain and joint pain.  Neurological:   Patient denies headaches and dizziness.  Psychologic:   Patient denies depression and anxiety.   Notes: Burning has greatly improved. No noted hematuria.    VITAL SIGNS:      04/18/2020 10:40  AM  Weight 220 lb / 99.79 kg  Height 69 in / 175.26 cm  BP 118/62 mmHg  Pulse 83 /min  Temperature 96.9 F / 36.0 C  BMI 32.5 kg/m   MULTI-SYSTEM PHYSICAL EXAMINATION:    Constitutional: Obese white male in no acute distress  Neck: Neck symmetrical, not swollen. Normal tracheal position.  Respiratory: No labored breathing, no use of accessory muscles.   Cardiovascular: Normal temperature, normal extremity pulses, no swelling, no varicosities.  Lymphatic: No enlargement of neck, axillae, groin.  Skin: No paleness, no jaundice, no cyanosis. No lesion, no ulcer, no rash.  Neurologic / Psychiatric: Oriented to time, oriented to place, oriented to person. No depression, no anxiety, no agitation.  Gastrointestinal: No mass, no tenderness, no rigidity, non obese abdomen.  Eyes: Normal conjunctivae. Normal eyelids.  Ears, Nose, Mouth, and Throat: Left ear no  scars, no lesions, no masses. Right ear no scars, no lesions, no masses. Nose no scars, no lesions, no masses. Normal hearing. Normal lips.  Musculoskeletal: Normal gait and station of head and neck. Walks with a walker     Complexity of Data:  Source Of History:  Patient  Records Review:   Previous Hospital Records, Previous Patient Records  Urine Test Review:   Urinalysis   07/29/16 05/20/15 05/03/13 07/05/11 06/30/10 12/23/09 07/01/09  PSA  Total PSA 0.07 ng/dl 0.07 ng/dl 0.07 ng/dl 0.17  0.24  0.25  0.60     PROCEDURES:          Urinalysis w/Scope - 81001 Dipstick Dipstick Cont'd Micro  Color: Yellow Bilirubin: Neg mg/dL WBC/hpf: 40 - 60/hpf  Appearance: Clear Ketones: Neg mg/dL RBC/hpf: 0 - 2/hpf  Specific Gravity: 1.025 Blood: Neg ery/uL Bacteria: Mod (26-50/hpf)  pH: 6.0 Protein: Trace mg/dL Cystals: NS (Not Seen)  Glucose: Neg mg/dL Urobilinogen: 0.2 mg/dL Casts: Granular    Nitrites: Neg Trichomonas: Not Present    Leukocyte Esterase: 2+ leu/uL Mucous: Present      Epithelial Cells: 0 - 5/hpf      Yeast: NS (Not Seen)      Sperm: Not Present    ASSESSMENT:      ICD-10 Details  1 GU:   Gross hematuria - Q25.9 Acute, Complicated Injury  2   History of prostate cancer - Z85.46 Chronic, Stable   PLAN:           Document Letter(s):  Created for Patient: Clinical Summary         Notes:   As the patient had prolonged hematuria prior to stopping the Coumadin I think we should still proceed with cysto retrogrades under anesthesia to fully evaluate the bladder and upper urinary tract. Patient agreeable. Schedule accordingly for early next week.

## 2020-04-19 ENCOUNTER — Other Ambulatory Visit (HOSPITAL_COMMUNITY)
Admission: RE | Admit: 2020-04-19 | Discharge: 2020-04-19 | Disposition: A | Discharge status: Home / Self Care | Payer: Medicare Other | Source: Ambulatory Visit | Attending: Urology | Admitting: Urology

## 2020-04-19 DIAGNOSIS — Z20822 Contact with and (suspected) exposure to covid-19: Secondary | ICD-10-CM | POA: Insufficient documentation

## 2020-04-19 DIAGNOSIS — Z01812 Encounter for preprocedural laboratory examination: Secondary | ICD-10-CM | POA: Insufficient documentation

## 2020-04-19 LAB — SARS CORONAVIRUS 2 (TAT 6-24 HRS): SARS Coronavirus 2: NEGATIVE

## 2020-04-22 ENCOUNTER — Encounter (HOSPITAL_COMMUNITY): Payer: Self-pay | Admitting: Urology

## 2020-04-22 ENCOUNTER — Ambulatory Visit (HOSPITAL_COMMUNITY): Payer: Medicare Other

## 2020-04-22 ENCOUNTER — Encounter (HOSPITAL_COMMUNITY)
Admission: RE | Disposition: A | Discharge status: Home / Self Care | Payer: Self-pay | Source: Home / Self Care | Attending: Urology

## 2020-04-22 ENCOUNTER — Ambulatory Visit (HOSPITAL_COMMUNITY): Payer: Medicare Other | Admitting: Physician Assistant

## 2020-04-22 ENCOUNTER — Ambulatory Visit (HOSPITAL_COMMUNITY): Payer: Medicare Other | Admitting: Anesthesiology

## 2020-04-22 ENCOUNTER — Ambulatory Visit (HOSPITAL_COMMUNITY)
Admission: RE | Admit: 2020-04-22 | Discharge: 2020-04-22 | Disposition: A | Discharge status: Home / Self Care | Payer: Medicare Other | Attending: Urology | Admitting: Urology

## 2020-04-22 DIAGNOSIS — Z950 Presence of cardiac pacemaker: Secondary | ICD-10-CM | POA: Diagnosis not present

## 2020-04-22 DIAGNOSIS — G4730 Sleep apnea, unspecified: Secondary | ICD-10-CM | POA: Diagnosis not present

## 2020-04-22 DIAGNOSIS — Z952 Presence of prosthetic heart valve: Secondary | ICD-10-CM | POA: Diagnosis not present

## 2020-04-22 DIAGNOSIS — M1990 Unspecified osteoarthritis, unspecified site: Secondary | ICD-10-CM | POA: Diagnosis not present

## 2020-04-22 DIAGNOSIS — Z923 Personal history of irradiation: Secondary | ICD-10-CM | POA: Insufficient documentation

## 2020-04-22 DIAGNOSIS — Z8546 Personal history of malignant neoplasm of prostate: Secondary | ICD-10-CM | POA: Insufficient documentation

## 2020-04-22 DIAGNOSIS — Z7901 Long term (current) use of anticoagulants: Secondary | ICD-10-CM | POA: Insufficient documentation

## 2020-04-22 DIAGNOSIS — I4891 Unspecified atrial fibrillation: Secondary | ICD-10-CM | POA: Insufficient documentation

## 2020-04-22 DIAGNOSIS — Z79899 Other long term (current) drug therapy: Secondary | ICD-10-CM | POA: Diagnosis not present

## 2020-04-22 DIAGNOSIS — I10 Essential (primary) hypertension: Secondary | ICD-10-CM | POA: Diagnosis not present

## 2020-04-22 DIAGNOSIS — R310 Gross hematuria: Secondary | ICD-10-CM | POA: Insufficient documentation

## 2020-04-22 DIAGNOSIS — D090 Carcinoma in situ of bladder: Secondary | ICD-10-CM | POA: Insufficient documentation

## 2020-04-22 HISTORY — PX: CYSTOSCOPY W/ RETROGRADES: SHX1426

## 2020-04-22 SURGERY — CYSTOSCOPY, WITH RETROGRADE PYELOGRAM
Anesthesia: General | Laterality: Bilateral

## 2020-04-22 MED ORDER — FENTANYL CITRATE (PF) 100 MCG/2ML IJ SOLN
INTRAMUSCULAR | Status: DC | PRN
Start: 2020-04-22 — End: 2020-04-22
  Administered 2020-04-22 (×2): 50 ug via INTRAVENOUS

## 2020-04-22 MED ORDER — PHENYLEPHRINE 40 MCG/ML (10ML) SYRINGE FOR IV PUSH (FOR BLOOD PRESSURE SUPPORT)
PREFILLED_SYRINGE | INTRAVENOUS | Status: AC
  Filled 2020-04-22: qty 10

## 2020-04-22 MED ORDER — LIDOCAINE HCL URETHRAL/MUCOSAL 2 % EX GEL
CUTANEOUS | Status: AC
  Filled 2020-04-22: qty 30

## 2020-04-22 MED ORDER — PHENYLEPHRINE HCL (PRESSORS) 10 MG/ML IV SOLN
INTRAVENOUS | Status: DC | PRN
  Administered 2020-04-22 (×3): 80 ug via INTRAVENOUS

## 2020-04-22 MED ORDER — EPHEDRINE 5 MG/ML INJ
INTRAVENOUS | Status: AC
  Filled 2020-04-22: qty 10

## 2020-04-22 MED ORDER — EPHEDRINE SULFATE-NACL 50-0.9 MG/10ML-% IV SOSY
PREFILLED_SYRINGE | INTRAVENOUS | Status: DC | PRN
  Administered 2020-04-22: 5 mg via INTRAVENOUS

## 2020-04-22 MED ORDER — IOPAMIDOL (ISOVUE-300) INJECTION 61%
INTRAVENOUS | Status: DC | PRN
  Administered 2020-04-22: 16 mL

## 2020-04-22 MED ORDER — ONDANSETRON HCL 4 MG/2ML IJ SOLN
INTRAMUSCULAR | Status: DC | PRN
  Administered 2020-04-22: 4 mg via INTRAVENOUS

## 2020-04-22 MED ORDER — ONDANSETRON HCL 4 MG/2ML IJ SOLN
INTRAMUSCULAR | Status: AC
  Filled 2020-04-22: qty 2

## 2020-04-22 MED ORDER — STERILE WATER FOR IRRIGATION IR SOLN
Status: DC | PRN
  Administered 2020-04-22: 3000 mL

## 2020-04-22 MED ORDER — CEFAZOLIN SODIUM-DEXTROSE 2-4 GM/100ML-% IV SOLN
2.0000 g | INTRAVENOUS | Status: AC
  Administered 2020-04-22: 2 g via INTRAVENOUS
  Filled 2020-04-22: qty 100

## 2020-04-22 MED ORDER — FENTANYL CITRATE (PF) 100 MCG/2ML IJ SOLN
INTRAMUSCULAR | Status: AC
  Filled 2020-04-22: qty 2

## 2020-04-22 MED ORDER — ORAL CARE MOUTH RINSE: 15.0000 mL | Freq: Once | OROMUCOSAL | Status: AC

## 2020-04-22 MED ORDER — DEXAMETHASONE SODIUM PHOSPHATE 4 MG/ML IJ SOLN
INTRAMUSCULAR | Status: DC | PRN
  Administered 2020-04-22: 10 mg via INTRAVENOUS

## 2020-04-22 MED ORDER — LACTATED RINGERS IV SOLN: INTRAVENOUS | Status: DC

## 2020-04-22 MED ORDER — DEXAMETHASONE SODIUM PHOSPHATE 10 MG/ML IJ SOLN
INTRAMUSCULAR | Status: AC
  Filled 2020-04-22: qty 3

## 2020-04-22 MED ORDER — 0.9 % SODIUM CHLORIDE (POUR BTL) OPTIME
TOPICAL | Status: DC | PRN
  Administered 2020-04-22: 1000 mL

## 2020-04-22 MED ORDER — PROPOFOL 10 MG/ML IV BOLUS
INTRAVENOUS | Status: DC | PRN
  Administered 2020-04-22: 30 mg via INTRAVENOUS
  Administered 2020-04-22: 100 mg via INTRAVENOUS

## 2020-04-22 MED ORDER — LIDOCAINE HCL (CARDIAC) PF 100 MG/5ML IV SOSY
PREFILLED_SYRINGE | INTRAVENOUS | Status: DC | PRN
  Administered 2020-04-22: 60 mg via INTRAVENOUS

## 2020-04-22 MED ORDER — CHLORHEXIDINE GLUCONATE 0.12 % MT SOLN
15.0000 mL | Freq: Once | OROMUCOSAL | Status: AC
  Administered 2020-04-22: 15 mL via OROMUCOSAL

## 2020-04-22 MED ORDER — BELLADONNA ALKALOIDS-OPIUM 16.2-30 MG RE SUPP
RECTAL | Status: AC
Start: 2020-04-22 — End: ?
  Filled 2020-04-22: qty 1

## 2020-04-22 SURGICAL SUPPLY — 16 items
BAG URO CATCHER STRL LF (MISCELLANEOUS) ×3 IMPLANT
CATH INTERMIT  6FR 70CM (CATHETERS) IMPLANT
CLOTH BEACON ORANGE TIMEOUT ST (SAFETY) ×3 IMPLANT
COVER WAND RF STERILE (DRAPES) IMPLANT
GLOVE BIOGEL M STRL SZ7.5 (GLOVE) ×3 IMPLANT
GOWN STRL REUS W/TWL LRG LVL3 (GOWN DISPOSABLE) ×3 IMPLANT
GUIDEWIRE STR DUAL SENSOR (WIRE) ×3 IMPLANT
KIT TURNOVER KIT A (KITS) IMPLANT
MANIFOLD NEPTUNE II (INSTRUMENTS) ×3 IMPLANT
NS IRRIG 1000ML POUR BTL (IV SOLUTION) IMPLANT
PACK CYSTO (CUSTOM PROCEDURE TRAY) ×3 IMPLANT
PENCIL SMOKE EVACUATOR (MISCELLANEOUS) IMPLANT
SYR 20ML LL LF (SYRINGE) ×2 IMPLANT
TUBING CONNECTING 10 (TUBING) ×2 IMPLANT
TUBING CONNECTING 10' (TUBING) ×1
TUBING INSUFFLATION 10FT LAP (TUBING) IMPLANT

## 2020-04-22 NOTE — Anesthesia Preprocedure Evaluation (Addendum)
Anesthesia Evaluation  Patient identified by MRN, date of birth, ID band Patient awake    Reviewed: Allergy & Precautions, NPO status , Patient's Chart, lab work & pertinent test results  Airway Mallampati: II  TM Distance: >3 FB Neck ROM: Full    Dental  (+) Teeth Intact, Dental Advisory Given   Pulmonary    breath sounds clear to auscultation       Cardiovascular hypertension, Pt. on home beta blockers + dysrhythmias Atrial Fibrillation + pacemaker  Rhythm:Regular Rate:Normal  - s/p TAVR   Neuro/Psych TIACVA    GI/Hepatic negative GI ROS, Neg liver ROS,   Endo/Other  negative endocrine ROS  Renal/GU negative Renal ROS     Musculoskeletal  (+) Arthritis ,   Abdominal Normal abdominal exam  (+)   Peds  Hematology negative hematology ROS (+)   Anesthesia Other Findings   Reproductive/Obstetrics                            Anesthesia Physical Anesthesia Plan  ASA: III  Anesthesia Plan: General   Post-op Pain Management:    Induction: Intravenous  PONV Risk Score and Plan: 3 and Ondansetron and Treatment may vary due to age or medical condition  Airway Management Planned: LMA  Additional Equipment: None  Intra-op Plan:   Post-operative Plan: Extubation in OR  Informed Consent: I have reviewed the patients History and Physical, chart, labs and discussed the procedure including the risks, benefits and alternatives for the proposed anesthesia with the patient or authorized representative who has indicated his/her understanding and acceptance.     Dental advisory given  Plan Discussed with: CRNA  Anesthesia Plan Comments:        Anesthesia Quick Evaluation

## 2020-04-22 NOTE — Discharge Instructions (Signed)
Hold Coumadin until follow-up appointment.  No strenuous or exertional activity until follow-up appointment.

## 2020-04-22 NOTE — Anesthesia Procedure Notes (Signed)
Procedure Name: LMA Insertion Date/Time: 04/22/2020 1:24 PM Performed by: Lavina Hamman, CRNA Pre-anesthesia Checklist: Patient identified, Emergency Drugs available, Suction available and Patient being monitored Patient Re-evaluated:Patient Re-evaluated prior to induction Oxygen Delivery Method: Circle System Utilized Preoxygenation: Pre-oxygenation with 100% oxygen Induction Type: IV induction Ventilation: Mask ventilation without difficulty LMA: LMA with gastric port inserted LMA Size: 4.0 Number of attempts: 1 Airway Equipment and Method: Bite block Placement Confirmation: positive ETCO2 Tube secured with: Tape Dental Injury: Teeth and Oropharynx as per pre-operative assessment

## 2020-04-22 NOTE — Transfer of Care (Addendum)
Immediate Anesthesia Transfer of Care Note  Patient: Robert Anderson  Procedure(s) Performed: CYSTOSCOPY WITH RETROGRADE PYELOGRAM/FULGERATION BLADDER BIOPSY (Bilateral )  Patient Location: PACU  Anesthesia Type:General  Level of Consciousness: awake, alert  and oriented  Airway & Oxygen Therapy: Patient Spontanous Breathing and Patient connected to face mask oxygen  Post-op Assessment: Report given to RN, Post -op Vital signs reviewed and stable and Patient moving all extremities  Post vital signs: Reviewed and stable  Last Vitals:  Vitals Value Taken Time  BP    Temp    Pulse    Resp    SpO2      Last Pain:  Vitals:   04/22/20 1211  TempSrc:   PainSc: 0-No pain         Complications: No complications documented.

## 2020-04-22 NOTE — Interval H&P Note (Signed)
History and Physical Interval Note:  04/22/2020 12:23 PM  Robert Anderson  has presented today for surgery, with the diagnosis of GROSS HEMATURIA.  The various methods of treatment have been discussed with the patient and family. After consideration of risks, benefits and other options for treatment, the patient has consented to  Procedure(s): CYSTOSCOPY WITH RETROGRADE PYELOGRAM/FULGERATION (Bilateral) as a surgical intervention.  The patient's history has been reviewed, patient examined, no change in status, stable for surgery.  I have reviewed the patient's chart and labs.  Questions were answered to the patient's satisfaction.     Remi Haggard

## 2020-04-22 NOTE — Op Note (Signed)
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  Appeared inflammatory no obvious tumor mass noted.  Representative biopsy taken and base fulgurated.  Bilateral retrogrades were normal  Operative note: After obtaining for consent for the patient he was taken the major cystoscopy suite placed under general anesthesia.  Placed in the dorsolithotomy position genitalia prepped and draped in usual sterile fashion.  Proper pause and timeout was performed for the procedure.  14 Fransico was advanced into the urethra and into the bladder without difficulty.  Patient did have somewhat contracted prostate no evidence of stricture.  Mild bilobar hypertrophy.  No tumors noted.  Bladder was thoroughly spread with 30 and 70 degree lenses revealed no evidence of obvious tumors.  There was a hyperemic area in the right posterior lateral aspect of the bladder which had the appearance of possible radiation cystitis but cannot rule out possibility of CIS.  Subsequent retrograde pyelograms were performed with a 5 French open tip catheter.  The ureteral orifice ease were somewhat narrowed and I used a sensor wire to cannulate the orifice with a 5 French open tip catheter over the guidewire.  Subsequent retrograde revealed normal filling of both upper tracts without evidence of filling defect or hydronephrosis.  Both upper tracts emptied out promptly upon removal of the retrograde catheter.  Attention was then directed towards taking bladder biopsy.  Rigid biopsy forceps were used to take a representative sample from the hyperemic area in the right  posterior lateral aspect of the bladder.  The base of the biopsy was then cauterized with the Bugbee electrode with good hemostasis noted.  Bladder was emptied and then reinflated again no bleeding noted.  Bladder was emptied again and the scope removed.  The procedure was terminated he was awakened from anesthesia and taken back to the recovery room in stable condition.  No immediate complication from the procedure.

## 2020-04-23 ENCOUNTER — Encounter (HOSPITAL_COMMUNITY): Payer: Self-pay | Admitting: Urology

## 2020-04-23 LAB — SURGICAL PATHOLOGY

## 2020-04-23 NOTE — Anesthesia Postprocedure Evaluation (Signed)
Anesthesia Post Note  Patient: Robert Anderson  Procedure(s) Performed: CYSTOSCOPY WITH RETROGRADE PYELOGRAM/FULGERATION BLADDER BIOPSY (Bilateral )     Patient location during evaluation: PACU Anesthesia Type: General Level of consciousness: awake and alert Pain management: pain level controlled Vital Signs Assessment: post-procedure vital signs reviewed and stable Respiratory status: spontaneous breathing, nonlabored ventilation, respiratory function stable and patient connected to nasal cannula oxygen Cardiovascular status: blood pressure returned to baseline and stable Postop Assessment: no apparent nausea or vomiting Anesthetic complications: no   No complications documented.  Last Vitals:  Vitals:   04/22/20 1433 04/22/20 1445  BP:  (!) 146/73  Pulse:  63  Resp:  20  Temp:  36.5 C  SpO2: 96% 95%    Last Pain:  Vitals:   04/22/20 1445  TempSrc:   PainSc: 0-No pain   Pain Goal:                   [name]

## 2020-05-07 ENCOUNTER — Ambulatory Visit (INDEPENDENT_AMBULATORY_CARE_PROVIDER_SITE_OTHER): Payer: Medicare Other

## 2020-05-07 ENCOUNTER — Other Ambulatory Visit: Payer: Self-pay

## 2020-05-07 DIAGNOSIS — Z79899 Other long term (current) drug therapy: Secondary | ICD-10-CM

## 2020-05-07 DIAGNOSIS — Z5181 Encounter for therapeutic drug level monitoring: Secondary | ICD-10-CM

## 2020-05-07 DIAGNOSIS — Z952 Presence of prosthetic heart valve: Secondary | ICD-10-CM

## 2020-05-07 LAB — POCT INR: INR: 1 — AB (ref 2.0–3.0)

## 2020-05-07 NOTE — Patient Instructions (Signed)
Patient instructed to take 0.5 tablet daily by Dr Newsome/Dr Nelson's office until further notice.  Will revisit Dr Milford Cage on 9/29 for further advisement. Repeat INR 10/1

## 2020-05-16 ENCOUNTER — Other Ambulatory Visit: Payer: Self-pay

## 2020-05-16 ENCOUNTER — Ambulatory Visit (INDEPENDENT_AMBULATORY_CARE_PROVIDER_SITE_OTHER): Payer: Medicare Other

## 2020-05-16 DIAGNOSIS — Z79899 Other long term (current) drug therapy: Secondary | ICD-10-CM

## 2020-05-16 DIAGNOSIS — Z5181 Encounter for therapeutic drug level monitoring: Secondary | ICD-10-CM | POA: Diagnosis not present

## 2020-05-16 DIAGNOSIS — Z952 Presence of prosthetic heart valve: Secondary | ICD-10-CM

## 2020-05-16 LAB — POCT INR: INR: 1.1 — AB (ref 2.0–3.0)

## 2020-05-16 NOTE — Patient Instructions (Signed)
Take 2 tablets today and then Patient instructed to take 0.5 tablet daily by Dr Newsome/Dr Nelson's office until further notice.  Will revisit Dr Milford Cage on 10/6 for further advisement. Repeat INR 10/8

## 2020-05-19 ENCOUNTER — Other Ambulatory Visit: Payer: Self-pay | Admitting: Cardiology

## 2020-05-19 DIAGNOSIS — I10 Essential (primary) hypertension: Secondary | ICD-10-CM

## 2020-05-19 DIAGNOSIS — Z952 Presence of prosthetic heart valve: Secondary | ICD-10-CM

## 2020-05-19 DIAGNOSIS — E785 Hyperlipidemia, unspecified: Secondary | ICD-10-CM

## 2020-05-23 ENCOUNTER — Ambulatory Visit (INDEPENDENT_AMBULATORY_CARE_PROVIDER_SITE_OTHER): Payer: Medicare Other

## 2020-05-23 ENCOUNTER — Other Ambulatory Visit: Payer: Self-pay

## 2020-05-23 DIAGNOSIS — Z79899 Other long term (current) drug therapy: Secondary | ICD-10-CM

## 2020-05-23 DIAGNOSIS — Z952 Presence of prosthetic heart valve: Secondary | ICD-10-CM

## 2020-05-23 DIAGNOSIS — Z5181 Encounter for therapeutic drug level monitoring: Secondary | ICD-10-CM | POA: Diagnosis not present

## 2020-05-23 LAB — POCT INR: INR: 1.4 — AB (ref 2.0–3.0)

## 2020-05-23 NOTE — Patient Instructions (Signed)
Take 2 tablets today and then continue 1 tablet daily except 1/2 tablet on Wednesday and Saturday Repeat INR 10/15

## 2020-05-30 ENCOUNTER — Other Ambulatory Visit: Payer: Self-pay

## 2020-05-30 ENCOUNTER — Ambulatory Visit (INDEPENDENT_AMBULATORY_CARE_PROVIDER_SITE_OTHER): Payer: Medicare Other

## 2020-05-30 DIAGNOSIS — Z952 Presence of prosthetic heart valve: Secondary | ICD-10-CM

## 2020-05-30 DIAGNOSIS — Z79899 Other long term (current) drug therapy: Secondary | ICD-10-CM | POA: Diagnosis not present

## 2020-05-30 DIAGNOSIS — Z5181 Encounter for therapeutic drug level monitoring: Secondary | ICD-10-CM

## 2020-05-30 LAB — POCT INR: INR: 2.8 (ref 2.0–3.0)

## 2020-05-30 NOTE — Patient Instructions (Signed)
continue 1 tablet daily except 1/2 tablet on Wednesday and Saturday Repeat INR 4 weeks

## 2020-06-16 ENCOUNTER — Other Ambulatory Visit: Payer: Self-pay | Admitting: Cardiology

## 2020-06-16 DIAGNOSIS — I10 Essential (primary) hypertension: Secondary | ICD-10-CM

## 2020-06-16 DIAGNOSIS — Z952 Presence of prosthetic heart valve: Secondary | ICD-10-CM

## 2020-06-25 ENCOUNTER — Ambulatory Visit (INDEPENDENT_AMBULATORY_CARE_PROVIDER_SITE_OTHER): Payer: Medicare Other

## 2020-06-25 DIAGNOSIS — R001 Bradycardia, unspecified: Secondary | ICD-10-CM | POA: Diagnosis not present

## 2020-06-25 LAB — CUP PACEART REMOTE DEVICE CHECK
Battery Remaining Longevity: 100 mo
Battery Remaining Percentage: 89 %
Battery Voltage: 2.95 V
Brady Statistic AP VP Percent: 69 %
Brady Statistic AP VS Percent: 1 %
Brady Statistic AS VP Percent: 31 %
Brady Statistic AS VS Percent: 1 %
Brady Statistic RA Percent Paced: 68 %
Brady Statistic RV Percent Paced: 99 %
Date Time Interrogation Session: 20211109020014
Implantable Lead Implant Date: 20161108
Implantable Lead Implant Date: 20161108
Implantable Lead Location: 753859
Implantable Lead Location: 753860
Implantable Pulse Generator Implant Date: 20161108
Lead Channel Impedance Value: 430 Ohm
Lead Channel Impedance Value: 450 Ohm
Lead Channel Pacing Threshold Amplitude: 0.75 V
Lead Channel Pacing Threshold Amplitude: 0.75 V
Lead Channel Pacing Threshold Pulse Width: 0.5 ms
Lead Channel Pacing Threshold Pulse Width: 0.5 ms
Lead Channel Sensing Intrinsic Amplitude: 10.4 mV
Lead Channel Sensing Intrinsic Amplitude: 5 mV
Lead Channel Setting Pacing Amplitude: 1 V
Lead Channel Setting Pacing Amplitude: 2 V
Lead Channel Setting Pacing Pulse Width: 0.5 ms
Lead Channel Setting Sensing Sensitivity: 5 mV
Pulse Gen Model: 2240
Pulse Gen Serial Number: 7825693

## 2020-06-26 NOTE — Progress Notes (Signed)
Remote pacemaker transmission.   

## 2020-06-27 ENCOUNTER — Other Ambulatory Visit: Payer: Self-pay

## 2020-06-27 ENCOUNTER — Ambulatory Visit (INDEPENDENT_AMBULATORY_CARE_PROVIDER_SITE_OTHER): Payer: Medicare Other

## 2020-06-27 DIAGNOSIS — Z5181 Encounter for therapeutic drug level monitoring: Secondary | ICD-10-CM

## 2020-06-27 DIAGNOSIS — Z79899 Other long term (current) drug therapy: Secondary | ICD-10-CM | POA: Diagnosis not present

## 2020-06-27 DIAGNOSIS — Z952 Presence of prosthetic heart valve: Secondary | ICD-10-CM

## 2020-06-27 LAB — POCT INR: INR: 1.7 — AB (ref 2.0–3.0)

## 2020-06-27 NOTE — Patient Instructions (Signed)
Take 2 tonight and then continue 1 tablet daily except 1/2 tablet on Wednesday and Saturday Repeat INR 4 weeks

## 2020-07-25 ENCOUNTER — Other Ambulatory Visit: Payer: Self-pay

## 2020-07-25 ENCOUNTER — Ambulatory Visit (INDEPENDENT_AMBULATORY_CARE_PROVIDER_SITE_OTHER): Payer: Medicare Other

## 2020-07-25 DIAGNOSIS — Z5181 Encounter for therapeutic drug level monitoring: Secondary | ICD-10-CM | POA: Diagnosis not present

## 2020-07-25 DIAGNOSIS — Z79899 Other long term (current) drug therapy: Secondary | ICD-10-CM | POA: Diagnosis not present

## 2020-07-25 DIAGNOSIS — Z952 Presence of prosthetic heart valve: Secondary | ICD-10-CM

## 2020-07-25 LAB — POCT INR: INR: 2.4 (ref 2.0–3.0)

## 2020-07-25 NOTE — Patient Instructions (Signed)
continue 1 tablet daily except 1/2 tablet on Wednesday and Saturday Repeat INR 6 weeks

## 2020-07-30 ENCOUNTER — Ambulatory Visit: Payer: Medicare Other | Admitting: Cardiology

## 2020-07-30 ENCOUNTER — Other Ambulatory Visit: Payer: Self-pay

## 2020-07-30 ENCOUNTER — Encounter: Payer: Self-pay | Admitting: Cardiology

## 2020-07-30 VITALS — BP 122/70 | HR 60 | Ht 69.0 in | Wt 232.0 lb

## 2020-07-30 DIAGNOSIS — Z9221 Personal history of antineoplastic chemotherapy: Secondary | ICD-10-CM | POA: Diagnosis not present

## 2020-07-30 DIAGNOSIS — I4811 Longstanding persistent atrial fibrillation: Secondary | ICD-10-CM

## 2020-07-30 DIAGNOSIS — I442 Atrioventricular block, complete: Secondary | ICD-10-CM

## 2020-07-30 DIAGNOSIS — I447 Left bundle-branch block, unspecified: Secondary | ICD-10-CM | POA: Diagnosis not present

## 2020-07-30 DIAGNOSIS — Z952 Presence of prosthetic heart valve: Secondary | ICD-10-CM | POA: Diagnosis not present

## 2020-07-30 DIAGNOSIS — I10 Essential (primary) hypertension: Secondary | ICD-10-CM

## 2020-07-30 NOTE — Progress Notes (Signed)
Cardiology Office Note    Date:  07/30/2020   ID:  Robert Anderson, DOB [birth year], MRN [id number]  PCP:  Dorothyann Peng, NP  Cardiologist: Ena Dawley, MD EPS: Cristopher Peru, MD  Reason for visit: 6 months follow-up  History of Present Illness:  Robert Anderson is a 86 y.o. 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  Last office visit with Dr. Meda Coffee 10/16/2018 Lopressor was changed to Toprol-XL.  ED echo 2017 normal LVEF 60 to 65%, hypertrophic cardiomyopathy no LV outflow tract gradient was measured, TAVR functioning normally, mean gradient 11 mmHg.  A couple of months ago he had a dizzy spell. Occurred at rest and only lasted 30 seconds The nurse at [facility] felt he was dehydrated. He now is trying to drink 2-3 cups of water and he hasn't had any more dizziness. Denies chest pain, palpitations, edema. Pacer check 03/27/19 Afib 1.6% time and stable. He misses going to the [facility] to do his exercises. Rides an exercise bike for 15 min and walks up and down the hall 4-5 times/day.  Complains of urinary frequency and getting up every hour and a half at night.  Not sure if the Flomax is helping him 3 times a week.  Has not seen his urologist in many years.  11/27/2019 -the patient is coming after 6 months, he has been doing great, he continues to use stationary bike twice a day for 24 minutes a day total.  He denies any chest pain or shortness of breath with that, his episodes of dizziness have resolved, he denies any palpitations syncope or falls.  07/29/2020 -the patient is coming after 6 months, he has been doing exceptionally well from cardiac standpoint, he is able to exercise, denies any chest pain or shortness of breath no palpitation dizziness or syncope no lower extremity edema  occasional orthostatic hypotension but no falls.  Unfortunately he was diagnosed with a bladder cancer in September and underwent cystoscopy followed by 6-week of chemotherapy that he just completed without significant side effects.  He is scheduled for follow-up cystoscopy at the end of December with his urologist.  Past Medical History:  Diagnosis Date  . Atrial fibrillation ([place])    a. dx after lead revision 06/26/15 >> anticoag not started due to recent pericardial effusion in setting of perforation and lead revision  . Bradycardia    a. HR 30s in clinic 06/2015 - BB discontinued.  . DEGENERATIVE JOINT DISEASE, SPINE   . ERECTILE DYSFUNCTION   . Essential hypertension   . First degree AV block   . HEARING LOSS   . Heart block    s/p Pacemaker  . Hyperlipidemia   . Hypertrophic obstructive cardiomyopathy ([place])   . LBBB (left bundle branch block)   . OA (osteoarthritis)    both hips  . Pericardial effusion    a. micorpeforation s/p lead revision 06/26/15  . Presence of permanent cardiac pacemaker 06/24/2015   St Jude Assurity  . Prostate cancer [facility]) 2010   "external radiation; 40 treatments"  . 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 Severe aortic stenosis    a. s/p TAVR 03/2015. Pre-op  LHC 02/2015: minor nonobstructive CAD.  Marland Kitchen  Squamous cell skin cancer    left lower leg  . Stroke ([place]) 02/19/2014   Small infarct high posterior left frontal lobe on MRI   . TIA (transient ischemic attack) 01/21/2014   5 min spell aphasia without assoc sx      Past Surgical History:  Procedure Laterality Date  . ANTERIOR CERVICAL DECOMP/DISCECTOMY FUSION  ~ 2000   Dr. Louanne Skye  . BACK SURGERY    . CARDIAC CATHETERIZATION N/A 02/20/2015   Procedure: Right/Left Heart Cath and Coronary Angiography;  Surgeon: Sherren Mocha, MD;  Location: [place] [facility];  Service: Cardiovascular;   Laterality: N/A;  . CARDIAC VALVE REPLACEMENT    . CATARACT EXTRACTION W/ INTRAOCULAR LENS  IMPLANT, BILATERAL  2004; 2010   right; left  . CYSTOSCOPY W/ RETROGRADES Bilateral 04/22/2020   Procedure: CYSTOSCOPY WITH RETROGRADE PYELOGRAM/FULGERATION BLADDER BIOPSY;  Surgeon: Remi Haggard, MD;  Location: [facility];  Service: Urology;  Laterality: Bilateral;  . EP IMPLANTABLE DEVICE N/A 06/24/2015   Procedure: Pacemaker Implant;  Surgeon: Evans Lance, MD;  Location: [place] [facility];  Service: Cardiovascular; St Jude Assurity   . EP IMPLANTABLE DEVICE N/A 06/26/2015   Procedure: Lead Revision/Repair;  Surgeon: Will Meredith Leeds, MD;  Location: [place] [facility];  Service: Cardiovascular;  Laterality: N/A;  . INSERT / REPLACE / REMOVE PACEMAKER  06/24/2015  . JOINT REPLACEMENT    . SQUAMOUS CELL CARCINOMA EXCISION Left X 3   "lower leg"  . TEE WITHOUT CARDIOVERSION N/A 03/07/2015   Procedure: TRANSESOPHAGEAL ECHOCARDIOGRAM (TEE);  Surgeon: Dorothy Spark, MD;  Location: [place];  Service: Cardiovascular;  Laterality: N/A;  . TEE WITHOUT CARDIOVERSION N/A 03/25/2015   Procedure: TRANSESOPHAGEAL ECHOCARDIOGRAM (TEE);  Surgeon: Burnell Blanks, MD;  Location: [place];  Service: Open Heart Surgery;  Laterality: N/A;  . TONSILLECTOMY    . TOTAL SHOULDER ARTHROPLASTY Right 2007   "wore it out playing tennis"  . TRANSCATHETER AORTIC VALVE REPLACEMENT, TRANSFEMORAL N/A 03/25/2015   Procedure: TRANSCATHETER AORTIC VALVE REPLACEMENT, TRANSFEMORAL;  Surgeon: Burnell Blanks, MD;  Location: [facility];  Service: Open Heart Surgery;  Laterality: N/A;    Current Medications: Current Meds  Medication Sig  . amoxicillin (AMOXIL) 500 MG capsule Take 2,000 mg by mouth See admin instructions. Take 2000 mg 1 hour prior to dental work  . docusate sodium (COLACE) 100 MG capsule Take 300 mg by mouth daily with lunch.   . metoprolol succinate (TOPROL-XL) 25 MG 24 hr tablet TAKE 1 TABLET BY MOUTH  EVERY DAY  . mirabegron ER (MYRBETRIQ) 50 MG TB24 tablet Take 50 mg by mouth every evening.   . Multiple Vitamins-Minerals (PRESERVISION AREDS 2+MULTI VIT PO) Take 1 tablet by mouth 2 (two) times daily.  . Omega-3 Fatty Acids (FISH OIL) 1000 MG CAPS Take 1,000 mg by mouth 2 (two) times daily.  Marland Kitchen PREVIDENT 5000 SENSITIVE 1.1-5 % PSTE Apply 1 application topically at bedtime.   . rosuvastatin (CRESTOR) 5 MG tablet TAKE 1 TABLET BY MOUTH EVERY DAY  . tamsulosin (FLOMAX) 0.4 MG CAPS capsule Take 0.4 mg by mouth daily after supper.      Allergies:   Patient has no known allergies.   Social History   Socioeconomic History  . Marital status: Married    Spouse name: Not on file  . Number of children: 4  . Years of education: 7  . Highest education level: Not on file  Occupational History  . Occupation: Retired  Tobacco Use  .  Smoking status: Never Smoker  . Smokeless tobacco: Never Used  Vaping Use  . Vaping Use: Never used  Substance and Sexual Activity  . Alcohol use: No    Alcohol/week: 0.0 standard drinks  . Drug use: No  . Sexual activity: Not Currently  Other Topics Concern  . Not on file  Social History Narrative   Patient lives in [place] with his wife. 2nd marriage    4 children    He goes to the [facility] three days a week      09/27/18: Lives at [facility] with wife   Still goes to [employer], 2X/week, bicycles/uses sitting eliptical in room daily   Enjoys singing, part of singing group at center   Active in discussion groups at center         Social Determinants of Health   Financial Resource Strain: Not on file  Food Insecurity: Not on file  Transportation Needs: Not on file  Physical Activity: Not on file  Stress: Not on file  Social Connections: Not on file     Family History:  The patient's   family history includes Brain cancer in his sister; CVA in his father and mother; Cancer in his father; Heart attack in his brother; Heart disease in his  brother and mother; Lung cancer in his brother; Prostate cancer in his brother; Stroke in his mother.   ROS:   Please see the history of present illness.    ROS All other systems reviewed and are negative.   PHYSICAL EXAM:   VS:  BP 122/70   Pulse 60   Ht 5\' 9"  (1.753 m)   Wt 232 lb (105.2 kg)   SpO2 93%   BMI 34.26 kg/m   Physical Exam  GEN: Obese, in no acute distress  Neck: no JVD, carotid bruits, or masses Cardiac:RRR; 2/6 systolic murmur at the left sternal border Respiratory:  clear to auscultation bilaterally, normal work of breathing GI: soft, nontender, nondistended, + BS YKD:XIPJ leg edema without cyanosis, clubbing, or  Good distal pulses bilaterally MS: no deformity or atrophy  Skin: warm and dry, no rash Neuro:  Alert and Oriented x 3, Strength and sensation are intact Psych: euthymic mood, full affect  Wt Readings from Last 3 Encounters:  07/30/20 232 lb (105.2 kg)  04/22/20 226 lb (102.5 kg)  04/17/20 226 lb (102.5 kg)      Studies/Labs Reviewed:   EKG:  EKG is not ordered today.    Recent Labs: 11/01/2019: ALT 16; TSH 4.08 04/17/2020: BUN 17; Creatinine, Ser 0.84; Hemoglobin 13.1; Platelets 186; Potassium 4.6; Sodium 142   Lipid Panel    Component Value Date/Time   CHOL 222 (H) 11/01/2019 1107   TRIG 240.0 (H) 11/01/2019 1107   HDL 38.50 (L) 11/01/2019 1107   CHOLHDL 6 11/01/2019 1107   VLDL 48.0 (H) 11/01/2019 1107   LDLCALC 136 (H) 07/29/2016 0855   LDLDIRECT 143.0 11/01/2019 1107    Additional studies/ records that were reviewed today include:  2D echo 2017Study Conclusions   - Left ventricle: The cavity size was normal. There was moderate   asymmetric septal hypertrophy. Systolic function was normal. The   estimated ejection fraction was in the range of 60% to 65%. Wall   motion was normal; there were no regional wall motion   abnormalities. Doppler parameters are consistent with abnormal   left ventricular relaxation (grade 1 diastolic  dysfunction). No   LV outflow tract gradient was measured. - Aortic valve: Bioprosthetic aortic  valve s/p transcatheter aortic   valve replacement. The TAVR valve appeared to function normally.   There was no significant regurgitation. Mean gradient (S): 11 mm   Hg. - Mitral valve: Mild systolic anterior motion of the anterior   mitral leaflet. - Left atrium: The atrium was mildly dilated. - Right ventricle: The cavity size was normal. Systolic function   was normal. - Right atrium: The atrium was mildly dilated. - Pulmonary arteries: No complete TR doppler jet so unable to   estimate PA systolic pressure. - Inferior vena cava: The vessel was normal in size. The   respirophasic diameter changes were in the normal range (>= 50%),   consistent with normal central venous pressure.   Impressions:   - Normal LV size with moderate asymmetric septal hypertrophy. EF   60-65%. Mild systolic anterior motion of the mitral valve. No LV   outflow tract gradient measured. Consistent with hypertrophic   cardiomyopathy. Normal RV size and systolic function. s/p TAVR,   valve appears to function normally.    ASSESSMENT:    1. S/P TAVR (transcatheter aortic valve replacement)   2. History of chemotherapy   3. Complete heart block ([place])   4. LBBB (left bundle branch block)   5. Longstanding persistent atrial fibrillation ([place])   6. Essential hypertension    PLAN:  In order of problems listed above:  Status post TAVR 2016 last echo 2017 functioning normally, I will obtain a new echocardiogram since it has been 4 years and he just underwent chemotherapy unfortunately I cannot find what chemo he was getting I will obtain from his urologist.  Paroxysmal atrial fibrillation on Coumadin and Toprol, no recent episodes of palpitations, he is compliant with his warfarin and is within therapeutic INR range most of the time.  Hypertrophic obstructive cardiomyopathy last echo 2017 on beta-blocker, no  syncope.  Essential hypertension controlled on current management, no falls.  Permanent pacemaker followed by Dr. Lovena Le -last seen in December 2020, pacemaker functioning properly.  EKG shows atrial pacing unchanged from prior.  Bladder cancer -status post chemo, followed by Dr. Milford Cage and scheduled for follow-up cystoscopy on 08/15/2020.  Hyperlipidemia, on low-dose of rosuvastatin 5 mg daily.  Medication Adjustments/Labs and Tests Ordered: Current medicines are reviewed at length with the patient today.  Concerns regarding medicines are outlined above.  Medication changes, Labs and Tests ordered today are listed in the Patient Instructions below. Patient Instructions  Medication Instructions:   Your physician recommends that you continue on your current medications as directed. Please refer to the Current Medication list given to you today.  *If you need a refill on your cardiac medications before your next appointment, please call your pharmacy*   Testing/Procedures:  Your physician has requested that you have an echocardiogram. Echocardiography is a painless test that uses sound waves to create images of your heart. It provides your doctor with information about the size and shape of your heart and how well your heart's chambers and valves are working. This procedure takes approximately one hour. There are no restrictions for this procedure.  PLEASE DO ECHO WITH STRAIN PER DR. Meda Coffee   Follow-Up: At [facility], you and your health needs are our priority.  As part of our continuing mission to provide you with exceptional heart care, we have created designated Provider Care Teams.  These Care Teams include your primary Cardiologist (physician) and Advanced Practice Providers (APPs -  Physician Assistants and Nurse Practitioners) who all work together to provide you  with the care you need, when you need it.  We recommend signing up for the patient portal called "MyChart".  Sign up  information is provided on this After Visit Summary.  MyChart is used to connect with patients for Virtual Visits (Telemedicine).  Patients are able to view lab/test results, encounter notes, upcoming appointments, etc.  Non-urgent messages can be sent to your provider as well.   To learn more about what you can do with MyChart, go to [email or web address].    Your next appointment:   6 month(s)  The format for your next appointment:   In Person  Provider:   Ena Dawley, MD        Signed, Ena Dawley, MD  07/30/2020 3:17 PM    [facility] [address], [place], [place]  [address] Phone: [phone]; Fax: [phone]

## 2020-07-30 NOTE — Patient Instructions (Signed)
Medication Instructions:   Your physician recommends that you continue on your current medications as directed. Please refer to the Current Medication list given to you today.  *If you need a refill on your cardiac medications before your next appointment, please call your pharmacy*   Testing/Procedures:  Your physician has requested that you have an echocardiogram. Echocardiography is a painless test that uses sound waves to create images of your heart. It provides your doctor with information about the size and shape of your heart and how well your heart's chambers and valves are working. This procedure takes approximately one hour. There are no restrictions for this procedure.  PLEASE DO ECHO WITH STRAIN PER DR. Meda Coffee   Follow-Up: At [facility], you and your health needs are our priority.  As part of our continuing mission to provide you with exceptional heart care, we have created designated Provider Care Teams.  These Care Teams include your primary Cardiologist (physician) and Advanced Practice Providers (APPs -  Physician Assistants and Nurse Practitioners) who all work together to provide you with the care you need, when you need it.  We recommend signing up for the patient portal called "MyChart".  Sign up information is provided on this After Visit Summary.  MyChart is used to connect with patients for Virtual Visits (Telemedicine).  Patients are able to view lab/test results, encounter notes, upcoming appointments, etc.  Non-urgent messages can be sent to your provider as well.   To learn more about what you can do with MyChart, go to [email or web address].    Your next appointment:   6 month(s)  The format for your next appointment:   In Person  Provider:   Ena Dawley, MD

## 2020-08-21 ENCOUNTER — Other Ambulatory Visit: Payer: Self-pay | Admitting: Urology

## 2020-08-21 ENCOUNTER — Telehealth: Payer: Self-pay | Admitting: Cardiology

## 2020-08-21 NOTE — Telephone Encounter (Signed)
   [facility] Pre-operative Risk Assessment    HEARTCARE STAFF: - Please ensure there is not already an duplicate clearance open for this procedure. - Under Visit Info/Reason for Call, type in Other and utilize the format Clearance MM/DD/YY or Clearance TBD. Do not use dashes or single digits. - If request is for dental extraction, please clarify the # of teeth to be extracted.  Request for surgical clearance:  1. What type of surgery is being performed? Bladder Tumor removed   2. When is this surgery scheduled? 09-09-20   3. What type of clearance is required (medical clearance vs. Pharmacy clearance to hold med vs. Both)? both  4. Are there any medications that need to be held prior to surgery and how long? Coumadin- need to stop 5 days prior to surgery  5. Practice name and name of physician performing surgery?  Dr Milford Cage   6. What is the office phone number? [phone]   7.   What is the office fax number? [phone]   8.   Anesthesia type (None, local, MAC, general) ? General  Robert Anderson 08/21/2020, 10:13 AM  _________________________________________________________________   (provider comments below)

## 2020-08-21 NOTE — Telephone Encounter (Signed)
Patient with diagnosis of afib on warfarin for anticoagulation.     Procedure: Bladder Tumor removed  Date of procedure: 09/09/20    CHA2DS2-VASc Score = 6  This indicates a 9.7% annual risk of stroke. The patient's score is based upon: CHF History: No HTN History: Yes Diabetes History: No Stroke History: Yes Vascular Disease History: Yes Age Score: 2 Gender Score: 0      CrCl 64 ml/min Platelet count 186  Patient may hold wafrarin for 5 days prior to procedure.  Would typically bridge in this situation due to hx of TIA and CVA. However, due to patient age, will defer to Dr. Delton See.

## 2020-08-26 ENCOUNTER — Other Ambulatory Visit: Payer: Self-pay

## 2020-08-26 ENCOUNTER — Ambulatory Visit (HOSPITAL_COMMUNITY): Payer: Medicare Other | Attending: Cardiovascular Disease

## 2020-08-26 DIAGNOSIS — Z9221 Personal history of antineoplastic chemotherapy: Secondary | ICD-10-CM | POA: Diagnosis not present

## 2020-08-26 DIAGNOSIS — Z952 Presence of prosthetic heart valve: Secondary | ICD-10-CM | POA: Diagnosis not present

## 2020-08-26 LAB — ECHOCARDIOGRAM COMPLETE
AR max vel: 2.99 cm2
AV Area VTI: 3.22 cm2
AV Area mean vel: 3.21 cm2
AV Mean grad: 8 mmHg
AV Peak grad: 16.6 mmHg
Ao pk vel: 2.04 m/s
Area-P 1/2: 4.58 cm2
S' Lateral: 3.4 cm

## 2020-08-26 NOTE — Telephone Encounter (Signed)
LPMTCB in regards to moving up Coumadin appointment to discuss Lovenox bridging.

## 2020-08-26 NOTE — Telephone Encounter (Signed)
Melissa, would you assist the patient with bridging? Thank you,  KN

## 2020-08-26 NOTE — Telephone Encounter (Signed)
Spoke with patient's wife, appointment re-scheduled for Monday Jan 17.  Wife may come along to learn about injection technique

## 2020-08-27 ENCOUNTER — Telehealth: Payer: Self-pay | Admitting: Cardiology

## 2020-08-27 NOTE — Telephone Encounter (Signed)
   Primary Cardiologist: Ena Dawley, MD  Chart reviewed as part of pre-operative protocol coverage. Patient was contacted 08/27/2020 in reference to pre-operative risk assessment for pending surgery as outlined below.  [name] was last seen on 07/30/2020 by Dr. Meda Coffee.  Since that day, Kden A Chew has done well without chest pain. Echocardiogram obtained on 08/26/2020 was reassuring.   Therefore, based on ACC/AHA guidelines, the patient would be at acceptable risk for the planned procedure without further cardiovascular testing.   Dr. Meda Coffee recommended lovenox bridging while holding coumadin for 5 days prior to the procedure. Our coumadin clinic will arrange earlier visit to discuss lovenox bridging.   The patient was advised that if he develops new symptoms prior to surgery to contact our office to arrange for a follow-up visit, and he verbalized understanding.  I will route this recommendation to the requesting party via Epic fax function and remove from pre-op pool. Please call with questions.  [place], [place] 08/27/2020, 3:17 PM

## 2020-08-27 NOTE — Telephone Encounter (Signed)
The pt and his wife called in regards to needing clearance from Dr. Meda Coffee for upcoming surgery with [facility], see pre op notes. Pt will need Lovenox Bridge as well. I did advise the pt to keep his appt with the Pharmacist on 09/01/20 and they will set up Lovenox Bridge. I assured both the pt and his wife they will also make sure that Dr. Meda Coffee has cleared the pt as well. I will send this note to pre op and PharmD as well.

## 2020-08-27 NOTE — Telephone Encounter (Signed)
Pt was already cleared for procedure in 08/21/20 phone note, Dr Meda Coffee is aware of procedure and recommended Lovenox bridging which will be coordinated at next INR check.

## 2020-08-27 NOTE — Telephone Encounter (Signed)
Patient called and wanted to discuss his Echo results. Plese call back

## 2020-08-29 ENCOUNTER — Ambulatory Visit (INDEPENDENT_AMBULATORY_CARE_PROVIDER_SITE_OTHER): Payer: Medicare Other

## 2020-08-29 ENCOUNTER — Other Ambulatory Visit: Payer: Self-pay

## 2020-08-29 DIAGNOSIS — Z79899 Other long term (current) drug therapy: Secondary | ICD-10-CM

## 2020-08-29 DIAGNOSIS — I4891 Unspecified atrial fibrillation: Secondary | ICD-10-CM

## 2020-08-29 DIAGNOSIS — Z5181 Encounter for therapeutic drug level monitoring: Secondary | ICD-10-CM | POA: Diagnosis not present

## 2020-08-29 DIAGNOSIS — Z952 Presence of prosthetic heart valve: Secondary | ICD-10-CM

## 2020-08-29 LAB — POCT INR: INR: 2.3 (ref 2.0–3.0)

## 2020-08-29 MED ORDER — ENOXAPARIN SODIUM 100 MG/ML ~~LOC~~ SOLN: 100.0000 mg | Freq: Two times a day (BID) | SUBCUTANEOUS | 0 refills | Status: DC

## 2020-08-29 NOTE — Patient Instructions (Signed)
continue 1 tablet daily except 1/2 tablet on Wednesday and Saturday Repeat INR 3 weeks    09/03/20: Last dose of warfarin.  09/04/20: No warfarin or enoxaparin (Lovenox).  09/05/20: Inject enoxaparin 100mg  in the fatty abdominal tissue at least 2 inches from the belly button twice a day about 12 hours apart, 8am and 8pm rotate sites. No warfarin.  1/222/22: Inject enoxaparin in the fatty tissue every 12 hours, 8am and 8pm. No warfarin.  09/07/20: Inject enoxaparin in the fatty tissue every 12 hours, 8am and 8pm. No warfarin.  09/08/20: Inject enoxaparin in the fatty tissue in the morning at 8 am (No PM dose). No warfarin.  09/09/20: Procedure Day - No enoxaparin - Resume warfarin in the evening or as directed by doctor   09/10/20: Resume enoxaparin inject in the fatty tissue every 12 hours and take warfarin 1/2 tablet  09/11/20: Inject enoxaparin in the fatty tissue every 12 hours and take warfarin 1 tablet  09/12/20: Inject enoxaparin in the fatty tissue every 12 hours and take warfarin 1 tablet  09/13/20: Inject enoxaparin in the fatty tissue every 12 hours and take warfarin 1/2 tablet  09/14/20: Inject enoxaparin in the fatty tissue every 12 hours and take warfarin 1 tablet  09/15/20: warfarin appt to check INR.

## 2020-09-05 ENCOUNTER — Other Ambulatory Visit (HOSPITAL_COMMUNITY)
Admission: RE | Admit: 2020-09-05 | Discharge: 2020-09-05 | Disposition: A | Discharge status: Home / Self Care | Payer: Medicare Other | Source: Ambulatory Visit | Attending: Urology | Admitting: Urology

## 2020-09-05 ENCOUNTER — Other Ambulatory Visit (HOSPITAL_COMMUNITY): Payer: Medicare Other

## 2020-09-05 DIAGNOSIS — Z20822 Contact with and (suspected) exposure to covid-19: Secondary | ICD-10-CM | POA: Diagnosis not present

## 2020-09-05 DIAGNOSIS — Z01812 Encounter for preprocedural laboratory examination: Secondary | ICD-10-CM | POA: Diagnosis present

## 2020-09-05 LAB — SARS CORONAVIRUS 2 (TAT 6-24 HRS): SARS Coronavirus 2: NEGATIVE

## 2020-09-08 ENCOUNTER — Encounter (HOSPITAL_BASED_OUTPATIENT_CLINIC_OR_DEPARTMENT_OTHER): Payer: Self-pay | Admitting: Urology

## 2020-09-08 ENCOUNTER — Other Ambulatory Visit: Payer: Self-pay

## 2020-09-08 ENCOUNTER — Encounter: Payer: Self-pay | Admitting: Internal Medicine

## 2020-09-08 NOTE — H&P (Signed)
Robert Anderson is a 87 year-old male established patient who is here for blood in the urine.   10/19/2019: Here today for evaluation of gross hematuria. He is on coumadin for hx of aortic valve replacement, Afib. His last INR was 3.3.   Approximately 2 weeks ago the patient noted painless passage of hematuria with clot material that cleared with next void only to reoccur last night. Denies any visible blood or painful/burning urination today. He does have microscopic hematuria on exam. Voiding symptoms are stable, not worsening. He continues tamsulosin and Myrbetriq. He tells me his frequency and nocturia are better controlled with taking the Myrbetriq. No progressively worsening ability to start his stream. He denies any associated lower back or flank pain/discomfort. He denies past history of kidney stones.   02/15/2020: Under cystoscopic evaluation at time of last office visit he was found to have a moderate bulbous urethral stricture. This was passively dilated using the cystoscope. This plus exam findings consistent with past radiation therapy in the bladder thought to be the source of his hematuria. PRN f/u was recommended. Of note, the patient remains on Warfarin for past hx of Afib, pacemaker, AVR. Last INR was 2.9.   Pt has been having intermittent passage of hematuria for the past two weeks. Sometimes associated with some mild burning and stinging with voiding. He also notes having worsening urinary frequency and urgency as well as nocturia despite ongoing use of Myrbetriq and tamsulosin. This has been progressively worsening over the past several months. Not associated with f/c, n/v, new or worsening flank/lower back pain or discomfort.   Patient tells me he has not been taking tamsulosin. He does have some increased hesitancy, mild straining before initiating stream but states stream feels appropriate. He denies any splitting or spraying of his stream. He does mention to me he has been doing  exercises at home in the form of abdominal crunch is as well as riding a stationary bike for 10-15 minutes at a time a few times per day.  -02/19/20-patient is a 87 year old white male with history of prostate cancer diagnosed in March 2010. Underwent radiation therapy completed 03/07/2009. Previously followed by Dr. Karsten Ro. Patient has had some issues with recurrence intermittent hematuria. Was seen by Dr. Karsten Ro back in March 2021 and cysto that time revealed a bulbous stricture which was dilated by passing of the scope. Bladder was described as having no obvious lesions of the cyst than some hypervascularity in the bladder. Patient also had CT scan on 10/24/2019 showed essentially normal upper urinary tract. Patient was recently seen by their Noberto Retort and was noted to have some microscopic hematuria with history of recurring gross hematuria. Schedule for follow-up here.  -03/31/20-patient with history of prostate cancer status post radiation therapy completed in 2010 as above. Had some recurrent episodes of hematuria and underwent cysto on 02/19/2020 showing some friable blood vessels at the bladder neck but otherwise normal bladder. In the interim the patient has had some recurrent episodes of hematuria. Blood became worse over the weekend and he has had some associated dysuria as well. Denies any fever or flank pain. Remains on Coumadin. Urine today is grossly bloody..  I attempted flexible cysto today and saw no active bleeding within the prostatic fossa. Urine in the bladder made visualization difficult and I could not delineate an obvious source. Ureteral orifices were not visualized.  -04/07/20-patient with history of prostate cancer status post radiation therapy completed in 2010 as above. Has had recurrent bouts of hematuria and  at last clinic visit on 03/31/2020 underwent cysto which was inconclusive on the actual source of hematuria but visualization was poor due to the hematuria. His PT level was up  with an INR of 3. I attempted several times to contact the patient was unable to get him by phone. In the interim the patient had followed up with his primary care physician and they have modified his Coumadin dose to reduce it. He is scheduled undergo repeat PT checked later in the week. In the meantime he has had some some intermittent gross hematuria which is fairly persistent currently. He did have a few days where cleared over the last week. He is not passing any significant clots.   -04/14/20-patient with history of adenocarcinoma the prostate status post radiation therapy completed in 2010 as above. Had some recent hematuria unclear etiology and Coumadin level was too high. This has been modified by PCP over the last several days to try to get a more normal INR for Coumadin. Over the last several days he has noted continued gross painless hematuria. He has not passing clots and is voiding satisfactorily.  -04/18/20-patient with history of adenocarcinoma the prostate status post external beam radiation therapy completed in 2010. Has had some continued gross painless hematuria. Cysto was nondiagnostic in the office due to poor visualization. He has been off his Coumadin now for 5 days. He states that hematuria is now resolved since stopping the Coumadin.  -04/28/20-patient with history of adenocarcinoma the prostate status post external beam radiation therapy in 2010. Underwent recent cysto and bladder biopsy and fulguration on 04/22/2020. There is a focal area in the right posterolateral portion of the bladder which was suspicious for either radiation cystitis versus possible CIS. 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 This area was fulgurated as well. Since procedure the patient has had no further hematuria. No issues postprocedure early  -05/14/20-patient with history of adenocarcinoma of the prostate status post external beam radiation therapy. Had some hematuria and underwent cysto  bladder biopsy showing carcinoma in situ. Here now for follow-up. He is back on his blood thinner. The patient has had no recurrent gross hematuria.  -08/14/20-patient with history of adenocarcinoma the prostate status post external beam radiation therapy. Had some hematuria and recent bladder biopsy showed carcinoma in situ. Subsequently underwent intravesical BCG induction therapy from 06/06/2020 through 07/10/1999 21. Tolerated treatment satisfactorily. He is now here for follow-up cystoscopy.  Cysto performed today and shows: Small recurrent papillary tumor along the right lateral bladder wall measuring about a half to 1 cm in size. There is also a mild erythematous area in the right posterolateral aspect of the bladder.     ALLERGIES: No Allergies    MEDICATIONS: Metoprolol Succinate  Myrbetriq 50 mg tablet, extended release 24 hr 1 tablet PO Daily  Tamsulosin Hcl 0.4 mg capsule TAKE 1 CAPSULE BY MOUTH DAILY 30 MINS AFTER EVENING MEAL  Warfarin Sodium 3 mg tablet  Acetaminophen  Colace  Docusate Sodium 100 mg capsule  Fish Oil  Preservision Areds  Prevident  Rosuvastatin Calcium 5 mg tablet     GU PSH: Bladder Instill AntiCA Agent - 07/09/2020, 07/02/2020, 06/18/2020, 06/11/2020, 06/04/2020, 05/28/2020 Cysto Bladder Ureth Biopsy - 04/22/2020 Cysto Dilate Stricture (M or F) - 02/19/2020, 10/30/2019 Cystoscopy - 03/31/2020 Locm 300-399Mg /Ml Iodine,1Ml - 10/24/2019       PSH Notes: Radiation Therapy, Shoulder Surgery, Neck Surgery, Cataract Surgery   NON-GU PSH: Neck Surgery (Unspecified) Pacemaker placement Shoulder Surgery (Unspecified)  GU PMH: CIS of the bladder - 07/09/2020, - 07/02/2020, - 06/18/2020, - 06/11/2020, - 06/04/2020, - 05/28/2020, - 05/21/2020, - 05/14/2020, - 04/28/2020 History of prostate cancer - 05/14/2020, - 04/28/2020, - 04/18/2020, - 10/19/2019 (Stable), He has a history of prostate cancer. PSA fell to nearly undetectable it has been 10 years now., - 05/21/2019, There  was no evidence of prostate cancer on his CT scan done on 07/06/15. His PSA at 0.07 is consistent with adequately treated prostate cancer., 2015-12-20 Gross hematuria - 04/18/2020, (Stable), - 04/14/2020 (Stable), - 04/07/2020, - 02/15/2020, It appears his gross hematuria was either secondary to a mild bulbar urethral stricture identified or possibly from some superficial vessels noted within the bladder consistent with radiation changes., - 10/30/2019, - 10/19/2019 Urinary Frequency - 02/19/2020 Urinary Hesitancy - 02/19/2020 Nocturia (Stable) - 02/15/2020, He has nocturia with no significant lower extremity swelling noted on exam. His PVR was 0 so I am going to give him Myrbetriq 25 mg samples and see so he will contact me for a prescription., - 05/21/2019 Urinary Urgency - 02/15/2020 Bulbar urethral stricture, He was found to have a mild bulbar urethral stricture that I was able to dilate with the cystoscope today. I told him he may have some initial hematuria the next time were to when he urinates. - 10/30/2019 Left testicular pain, His left testicular pain with bleeding and I have told him that I did not feel that this was in any way related to prostate cancer. The pain has resolved. 2015/12/20 Overactive bladder, He has symptoms of bladder overactivity likely secondary to the effects of radiation. I told him to remain on the tamsulosin and I was going to add Myrbetriq 25 mg to see if he notes improvement. If he does he will contact me for further description. Dec 20, 2015 BPH w/LUTS, Benign prostatic hyperplasia with urinary obstruction - 12-19-12 ED due to arterial insufficiency, Erectile dysfunction due to arterial insufficiency - 12/19/12 History of urolithiasis, Nephrolithiasis - 2012-12-19 Inflammatory Disease Prostate, Unspec, Prostatitis - 2012-12-19 Primary hypogonadism, Hypogonadism, testicular - 12/19/12      PMH Notes: He has adenocarcinoma of the prostate that was diagnosed after his PSA was found to have elevated to 7.28. His PSA had  been slowly rising over the years. Despite a benign feeling prostate I did recommended transrectal ultrasound and biopsy which was performed on 11/07/08 and was found to be positive for adenocarcinoma. There was a single core that contained Gleason 4+3 = 7 with the remaining cores showing a 3+4 and 3+3 pattern. A total of 6/12 cores were positive. He was treated with external beam radiation therapy which he completed on 03/07/09.    He has been seen in the past by Dr. Leory Plowman and was treated for prostatitis as well as hypogonadism with testosterone replacement. He is not currently on any testosterone replacement therapy.    He also had erectile dysfunction that was treated by him as well.     NON-GU PMH: Pyuria/other UA findings (Stable) - 05/14/2020, - 03/31/2020 Personal history of other diseases of the circulatory system, History of cardiac disorder - Dec 19, 2012, History of hypertension, - Dec 19, 2012 Personal history of other diseases of the nervous system and sense organs, History of sleep apnea - 12-19-12 Personal history of other infectious and parasitic diseases, History of hepatitis - 2012/12/19    FAMILY HISTORY: Death - Mother, Father Family Health Status Number - Runs In Family Lung Cancer - Brother Prostate Cancer - Brother Stroke Syndrome - Mother, Father  SOCIAL HISTORY: Marital Status: Married Preferred Language: English; Ethnicity: Not Hispanic Or Latino; Race: White Current Smoking Status: Patient has never smoked.  Has never drank.  Drinks 1 caffeinated drink per day.     Notes: Never A Smoker, Caffeine Use, Marital History - Currently Married, Alcohol Use, Previous History Of Smoking   REVIEW OF SYSTEMS:    GU Review Male:   Patient reports frequent urination, hard to postpone urination, and get up at night to urinate. Patient denies burning/ pain with urination, leakage of urine, stream starts and stops, trouble starting your stream, have to strain to urinate , erection problems, and penile  pain.  Gastrointestinal (Upper):   Patient denies nausea, vomiting, and indigestion/ heartburn.  Gastrointestinal (Lower):   Patient denies diarrhea and constipation.  Constitutional:   Patient denies fever, night sweats, weight loss, and fatigue.  Skin:   Patient denies skin rash/ lesion and itching.  Eyes:   Patient denies blurred vision and double vision.  Ears/ Nose/ Throat:   Patient denies sore throat and sinus problems.  Hematologic/Lymphatic:   Patient denies swollen glands and easy bruising.  Cardiovascular:   Patient denies leg swelling and chest pains.  Respiratory:   Patient denies cough and shortness of breath.  Endocrine:   Patient denies excessive thirst.  Musculoskeletal:   Patient denies back pain and joint pain.  Neurological:   Patient denies headaches and dizziness.  Psychologic:   Patient denies depression and anxiety.   Notes: Burning has greatly improved. No noted hematuria. Updated from previous visit 04/18/2020 with review from patient as noted above.   VITAL SIGNS:      08/14/2020 08:45 AM  Weight 220 lb / 99.79 kg  Height 68 in / 172.72 cm  BP 117/72 mmHg  Pulse 66 /min  Temperature 97.5 F / 36.3 C  BMI 33.4 kg/m   GU PHYSICAL EXAMINATION:    Anus and Perineum: No hemorrhoids. No anal stenosis. No rectal fissure, no anal fissure. No edema, no dimple, no perineal tenderness, no anal tenderness.  Scrotum: No lesions. No edema. No cysts. No warts.  Epididymides: Right: no spermatocele, no masses, no cysts, no tenderness, no induration, no enlargement. Left: no spermatocele, no masses, no cysts, no tenderness, no induration, no enlargement.  Testes: No tenderness, no swelling, no enlargement left testes. No tenderness, no swelling, no enlargement right testes. Normal location left testes. Normal location right testes. No mass, no cyst, no varicocele, no hydrocele left testes. No mass, no cyst, no varicocele, no hydrocele right testes.  Urethral Meatus: Normal  size. No lesion, no wart, no discharge, no polyp. Normal location.  Penis: Circumcised, no warts, no cracks. No dorsal Peyronie's plaques, no left corporal Peyronie's plaques, no right corporal Peyronie's plaques, no scarring, no warts. No balanitis, no meatal stenosis.  Prostate: 40 gram or 2+ size. Left lobe normal consistency, right lobe normal consistency. Symmetrical lobes. No prostate nodule. Left lobe no tenderness, right lobe no tenderness.  Seminal Vesicles: Nonpalpable.  Sphincter Tone: Normal sphincter. No rectal tenderness. No rectal mass.    MULTI-SYSTEM PHYSICAL EXAMINATION:    Constitutional: Well-nourished. No physical deformities. Normally developed. Good grooming.  Neck: Neck symmetrical, not swollen. Normal tracheal position.  Respiratory: No labored breathing, no use of accessory muscles.   Cardiovascular: Normal temperature, normal extremity pulses, no swelling, no varicosities.  Lymphatic: No enlargement of neck, axillae, groin.  Skin: No paleness, no jaundice, no cyanosis. No lesion, no ulcer, no rash.  Neurologic / Psychiatric: Oriented to  time, oriented to place, oriented to person. No depression, no anxiety, no agitation.  Gastrointestinal: No mass, no tenderness, no rigidity, non obese abdomen.  Eyes: Normal conjunctivae. Normal eyelids.  Ears, Nose, Mouth, and Throat: Left ear no scars, no lesions, no masses. Right ear no scars, no lesions, no masses. Nose no scars, no lesions, no masses. Normal hearing. Normal lips.  Musculoskeletal: Normal gait and station of head and neck.     Complexity of Data:   07/29/16 05/20/15 05/03/13 07/05/11 06/30/10 12/23/09 07/01/09  PSA  Total PSA 0.07 ng/dl 0.07 ng/dl 0.07 ng/dl 0.17  0.24  0.25  0.60     PROCEDURES:         Flexible Cystoscopy - 52000  Risks, benefits, and some of the potential complications of the procedure were discussed at length with the patient including infection, bleeding, voiding discomfort, urinary  retention, fever, chills, sepsis, and others. All questions were answered. Informed consent was obtained. Antibiotic prophylaxis was given. Sterile technique and intraurethral analgesia were used.  Meatus:  Normal size. Normal location. Normal condition.  Urethra:  No strictures.  External Sphincter:  Normal.  Verumontanum:  Normal.  Prostate:  Non-obstructing. No hyperplasia.  Bladder Neck:  Non-obstructing.  Ureteral Orifices:  Normal location. Normal size. Normal shape. Effluxed clear urine.  Bladder:  Cysto performed today and shows: Small recurrent papillary tumor along the right lateral bladder wall measuring about a half to 1 cm in size. There is also a mild erythematous area in the right posterolateral aspect of the bladder      The lower urinary tract was carefully examined. The procedure was well-tolerated and without complications. Antibiotic instructions were given. Instructions were given to call the office immediately for bloody urine, difficulty urinating, urinary retention, painful or frequent urination, fever, chills, nausea, vomiting or other illness. The patient stated that he understood these instructions and would comply with them.         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 I feel fairly sure this is a recurrent tumor in the right lateral bladder wall. Will plan for cysto and TURBT in the near future. Risks and benefits discussed as outlined below. He will need to be off his Coumadin for 5 days prior to the procedure.  TURBT consent: I have discussed with the patient the risks, benefits of TURBT which include but are not limited to: Bleeding, infection, damage to the bladder with potential  perforation of the bladder, damage to surrounding organs, possible need for further procedures including open repair and catheterization, possibility of nonhealing area within the bladder, urgency, frequency which may be refractory to medications. I pointed out that in some occasions after resection of the bladder tumor, mitomycin-C chemotherapy may be instilled into the bladder. The risks associated with this therapy include but are not limited to: Refractory or new onset urgency, frequency, dysuria, infrequently severe systemic side effects secondary to mitomycin-C. After full discussion of the risks, benefits and alternatives, the patient has consented to the above procedure and desires to proceed.

## 2020-09-08 NOTE — Progress Notes (Unsigned)
PERIOPERATIVE PRESCRIPTION FOR IMPLANTED CARDIAC DEVICE PROGRAMMING  Patient Information: Name:  Robert Anderson  DOB:  September 07, 1923  MRN:  [id number]  {TIP - You do not have to delete this tip  -  Copy the info from the staff message sent by the PAT staff  then press F2 here and paste the information using CTL - V on the next line :[id number]}  Planned Procedure: Transurethral resection of bladder tumor, cystoscopy with retrograde pyelogram  Surgeon: dr Harold Barban  Date of Procedure: 09-09-2020  Cautery will be used.  Position during surgery: lithotomy   Please send documentation back to:  [facility] (Fax # [phone])   Device Information:  Clinic EP Physician:  Cristopher Peru, MD   Device Type:  Pacemaker Manufacturer and Phone #:  [employer]: [phone] Pacemaker Dependent?:  Yes (at last check)  Date of Last Device Check:  07/04/2019 Normal Device Function?:  Unknown  Electrophysiologist's Recommendations:   Have magnet available.  Provide continuous ECG monitoring when magnet is used or reprogramming is to be performed.   Must be checked by industry prior. Patient is overdue to see Dr. Lovena Le and has not had his pacemaker checked within 1 year.  Per Device Clinic Standing Orders, Simone Curia, RN  3:54 PM 09/08/2020

## 2020-09-08 NOTE — Progress Notes (Addendum)
Spoke w/ via phone for pre-op interview---wife jerry and patient Lab needs dos----    I stat, pt, [place] test ------09-05-2020 1045 Arrive at -------1000 am 09-09-2020 NPO after MN NO Solid Food.  Clear liquids from MN until---900 am then npo Medications to take morning of surgery ----- metoprolol succinate Diabetic medication -----n/a Patient Special Instructions -----none Pre-Op special Istructions -----none Patient verbalized understanding of instructions that were given at this phone interview. Patient denies shortness of breath, chest pain, fever, cough at this phone interview.  Anesthesia Review: htn, tia, atrial fib, ischemic cardiomyopathy, packemaker, s/p tavr 2016 has cardiac clearance, pt with no c/a sob, chest pain , sob at pre op phone call  PCP: cory nafiger np lov 11-01-2019 epic  Cardiologist : dr Jefm Bryant 07-30-2020 cardiac clearance hao meng pa 08-27-2020 epic/chart, ep cardiology dr gregg taylor lov 07-04-2019 last pacer check note 06-25-2020 epic Chest x-ray :none EKG :07-30-2020 epic Echo :08-26-2020 epic Stress test:none Cardiac Cath : 2016 epic Activity level:  Walks with walker all the time does a few household chores Sleep Study/ CPAP :none Fasting Blood Sugar :      / Checks Blood Sugar -- times a day:  n/a Blood Thinner/ Instructions /Last Dose: last warfarin dose 09-03-2020 per Maryanna Shape [facility] instructions note 08-21-2020 epic/chart, pt on lovenox bridge, last dose 09-08-2020 800 am prior to 09-09-2020 surgery ASA / Instructions/ Last Dose : n/a   Pacemaker orders requested via epic for 09-09-2020 surgery from dr gregg taylor

## 2020-09-09 ENCOUNTER — Ambulatory Visit (HOSPITAL_BASED_OUTPATIENT_CLINIC_OR_DEPARTMENT_OTHER)
Admission: RE | Admit: 2020-09-09 | Discharge: 2020-09-09 | Disposition: A | Discharge status: Home / Self Care | Payer: Medicare Other | Attending: Urology | Admitting: Urology

## 2020-09-09 ENCOUNTER — Ambulatory Visit (HOSPITAL_BASED_OUTPATIENT_CLINIC_OR_DEPARTMENT_OTHER): Payer: Medicare Other | Admitting: Anesthesiology

## 2020-09-09 ENCOUNTER — Encounter (HOSPITAL_BASED_OUTPATIENT_CLINIC_OR_DEPARTMENT_OTHER): Payer: Self-pay | Admitting: Urology

## 2020-09-09 ENCOUNTER — Encounter (HOSPITAL_BASED_OUTPATIENT_CLINIC_OR_DEPARTMENT_OTHER)
Admission: RE | Disposition: A | Discharge status: Home / Self Care | Payer: Self-pay | Source: Home / Self Care | Attending: Urology

## 2020-09-09 ENCOUNTER — Other Ambulatory Visit: Payer: Self-pay

## 2020-09-09 DIAGNOSIS — C679 Malignant neoplasm of bladder, unspecified: Secondary | ICD-10-CM | POA: Diagnosis not present

## 2020-09-09 DIAGNOSIS — Z8619 Personal history of other infectious and parasitic diseases: Secondary | ICD-10-CM | POA: Insufficient documentation

## 2020-09-09 DIAGNOSIS — Z952 Presence of prosthetic heart valve: Secondary | ICD-10-CM | POA: Diagnosis not present

## 2020-09-09 DIAGNOSIS — Z801 Family history of malignant neoplasm of trachea, bronchus and lung: Secondary | ICD-10-CM | POA: Insufficient documentation

## 2020-09-09 DIAGNOSIS — Z8042 Family history of malignant neoplasm of prostate: Secondary | ICD-10-CM | POA: Insufficient documentation

## 2020-09-09 DIAGNOSIS — Z8669 Personal history of other diseases of the nervous system and sense organs: Secondary | ICD-10-CM | POA: Diagnosis not present

## 2020-09-09 HISTORY — DX: Malignant neoplasm of bladder, unspecified: C67.9

## 2020-09-09 HISTORY — DX: Presence of spectacles and contact lenses: Z97.3

## 2020-09-09 HISTORY — PX: TRANSURETHRAL RESECTION OF BLADDER TUMOR: SHX2575

## 2020-09-09 HISTORY — PX: CYSTOSCOPY W/ RETROGRADES: SHX1426

## 2020-09-09 HISTORY — DX: Unspecified hearing loss, unspecified ear: H91.90

## 2020-09-09 HISTORY — DX: Dependence on other enabling machines and devices: Z99.89

## 2020-09-09 HISTORY — DX: Unspecified macular degeneration: H35.30

## 2020-09-09 LAB — POCT I-STAT, CHEM 8
BUN: 21 mg/dL (ref 8–23)
Calcium, Ion: 1.29 mmol/L (ref 1.15–1.40)
Chloride: 107 mmol/L (ref 98–111)
Creatinine, Ser: 0.8 mg/dL (ref 0.61–1.24)
Glucose, Bld: 111 mg/dL — ABNORMAL HIGH (ref 70–99)
HCT: 44 % (ref 39.0–52.0)
Hemoglobin: 15 g/dL (ref 13.0–17.0)
Potassium: 4.3 mmol/L (ref 3.5–5.1)
Sodium: 141 mmol/L (ref 135–145)
TCO2: 27 mmol/L (ref 22–32)

## 2020-09-09 LAB — TYPE AND SCREEN
ABO/RH(D): A POS
Antibody Screen: NEGATIVE

## 2020-09-09 LAB — PROTIME-INR
INR: 1 (ref 0.8–1.2)
Prothrombin Time: 13.2 seconds (ref 11.4–15.2)

## 2020-09-09 SURGERY — TURBT (TRANSURETHRAL RESECTION OF BLADDER TUMOR)
Anesthesia: General | Site: Ureter

## 2020-09-09 MED ORDER — PHENYLEPHRINE HCL (PRESSORS) 10 MG/ML IV SOLN
INTRAVENOUS | Status: AC
  Filled 2020-09-09: qty 1

## 2020-09-09 MED ORDER — LACTATED RINGERS IV SOLN: INTRAVENOUS | Status: DC

## 2020-09-09 MED ORDER — FENTANYL CITRATE (PF) 100 MCG/2ML IJ SOLN: 25.0000 ug | INTRAMUSCULAR | Status: DC | PRN

## 2020-09-09 MED ORDER — IOHEXOL 300 MG/ML  SOLN
INTRAMUSCULAR | Status: DC | PRN
  Administered 2020-09-09: 20 mL via URETHRAL

## 2020-09-09 MED ORDER — PROPOFOL 10 MG/ML IV BOLUS
INTRAVENOUS | Status: AC
  Filled 2020-09-09: qty 20

## 2020-09-09 MED ORDER — CEFAZOLIN SODIUM-DEXTROSE 2-4 GM/100ML-% IV SOLN
INTRAVENOUS | Status: AC
  Filled 2020-09-09: qty 100

## 2020-09-09 MED ORDER — LIDOCAINE HCL (PF) 2 % IJ SOLN
INTRAMUSCULAR | Status: AC
  Filled 2020-09-09: qty 5

## 2020-09-09 MED ORDER — DEXAMETHASONE SODIUM PHOSPHATE 10 MG/ML IJ SOLN
INTRAMUSCULAR | Status: DC | PRN
  Administered 2020-09-09: 10 mg via INTRAVENOUS

## 2020-09-09 MED ORDER — CEFAZOLIN SODIUM-DEXTROSE 2-4 GM/100ML-% IV SOLN
2.0000 g | INTRAVENOUS | Status: AC
  Administered 2020-09-09: 2 g via INTRAVENOUS

## 2020-09-09 MED ORDER — DEXAMETHASONE SODIUM PHOSPHATE 10 MG/ML IJ SOLN
INTRAMUSCULAR | Status: AC
  Filled 2020-09-09: qty 1

## 2020-09-09 MED ORDER — PHENYLEPHRINE HCL-NACL 10-0.9 MG/250ML-% IV SOLN
INTRAVENOUS | Status: DC | PRN
Start: 2020-09-09 — End: 2020-09-09
  Administered 2020-09-09: 50 ug/min via INTRAVENOUS

## 2020-09-09 MED ORDER — FENTANYL CITRATE (PF) 100 MCG/2ML IJ SOLN
INTRAMUSCULAR | Status: DC | PRN
  Administered 2020-09-09: 25 ug via INTRAVENOUS

## 2020-09-09 MED ORDER — STERILE WATER FOR IRRIGATION IR SOLN
Status: DC | PRN
  Administered 2020-09-09: 3000 mL

## 2020-09-09 MED ORDER — ONDANSETRON HCL 4 MG/2ML IJ SOLN
INTRAMUSCULAR | Status: AC
  Filled 2020-09-09: qty 2

## 2020-09-09 MED ORDER — FENTANYL CITRATE (PF) 100 MCG/2ML IJ SOLN
INTRAMUSCULAR | Status: AC
  Filled 2020-09-09: qty 2

## 2020-09-09 MED ORDER — LIDOCAINE 2% (20 MG/ML) 5 ML SYRINGE
INTRAMUSCULAR | Status: DC | PRN
  Administered 2020-09-09: 100 mg via INTRAVENOUS

## 2020-09-09 MED ORDER — PHENYLEPHRINE 40 MCG/ML (10ML) SYRINGE FOR IV PUSH (FOR BLOOD PRESSURE SUPPORT)
PREFILLED_SYRINGE | INTRAVENOUS | Status: DC | PRN
  Administered 2020-09-09 (×2): 120 ug via INTRAVENOUS
  Administered 2020-09-09 (×2): 80 ug via INTRAVENOUS

## 2020-09-09 MED ORDER — AMISULPRIDE (ANTIEMETIC) 5 MG/2ML IV SOLN: 10.0000 mg | Freq: Once | INTRAVENOUS | Status: DC | PRN

## 2020-09-09 MED ORDER — ONDANSETRON HCL 4 MG/2ML IJ SOLN
INTRAMUSCULAR | Status: DC | PRN
  Administered 2020-09-09: 4 mg via INTRAVENOUS

## 2020-09-09 MED ORDER — PROPOFOL 10 MG/ML IV BOLUS
INTRAVENOUS | Status: DC | PRN
  Administered 2020-09-09: 30 mg via INTRAVENOUS
  Administered 2020-09-09: 150 mg via INTRAVENOUS

## 2020-09-09 SURGICAL SUPPLY — 38 items
BAG DRAIN URO-CYSTO SKYTR STRL (DRAIN) ×3 IMPLANT
BAG DRN RND TRDRP ANRFLXCHMBR (UROLOGICAL SUPPLIES)
BAG DRN UROCATH (DRAIN) ×2
BAG URINE DRAIN 2000ML AR STRL (UROLOGICAL SUPPLIES) IMPLANT
BAG URINE LEG 500ML (DRAIN) IMPLANT
BULB IRRIG PATHFIND (MISCELLANEOUS) IMPLANT
CATH FOLEY 2WAY SLVR  5CC 20FR (CATHETERS)
CATH FOLEY 2WAY SLVR  5CC 22FR (CATHETERS)
CATH FOLEY 2WAY SLVR 5CC 20FR (CATHETERS) IMPLANT
CATH FOLEY 2WAY SLVR 5CC 22FR (CATHETERS) IMPLANT
CATH URET 5FR 28IN CONE TIP (BALLOONS)
CATH URET 5FR 28IN OPEN ENDED (CATHETERS) IMPLANT
CATH URET 5FR 70CM CONE TIP (BALLOONS) IMPLANT
CLOTH BEACON ORANGE TIMEOUT ST (SAFETY) ×3 IMPLANT
DRSG TELFA 3X8 NADH (GAUZE/BANDAGES/DRESSINGS) ×3 IMPLANT
ELECT REM PT RETURN 9FT ADLT (ELECTROSURGICAL) ×3
ELECTRODE REM PT RTRN 9FT ADLT (ELECTROSURGICAL) ×2 IMPLANT
EVACUATOR MICROVAS BLADDER (UROLOGICAL SUPPLIES) IMPLANT
FIBER LASER FLEXIVA 365 (UROLOGICAL SUPPLIES) IMPLANT
GLOVE BIO SURGEON STRL SZ7 (GLOVE) ×1 IMPLANT
GLOVE BIO SURGEON STRL SZ7.5 (GLOVE) ×3 IMPLANT
GOWN STRL REUS W/ TWL XL LVL3 (GOWN DISPOSABLE) ×2 IMPLANT
GOWN STRL REUS W/TWL XL LVL3 (GOWN DISPOSABLE) ×3
GUIDEWIRE ANG ZIPWIRE 038X150 (WIRE) IMPLANT
GUIDEWIRE STR DUAL SENSOR (WIRE) IMPLANT
HOLDER FOLEY CATH W/STRAP (MISCELLANEOUS) IMPLANT
KIT TURNOVER CYSTO (KITS) ×3 IMPLANT
LOOP MONOPOLAR YLW (ELECTROSURGICAL) IMPLANT
MANIFOLD NEPTUNE II (INSTRUMENTS) IMPLANT
PACK CYSTO (CUSTOM PROCEDURE TRAY) ×3 IMPLANT
PAD DRESSING TELFA 3X8 NADH (GAUZE/BANDAGES/DRESSINGS) IMPLANT
PLUG CATH AND CAP STER (CATHETERS) IMPLANT
SYR 20ML LL LF (SYRINGE) ×3 IMPLANT
SYR TOOMEY IRRIG 70ML (MISCELLANEOUS)
SYRINGE TOOMEY IRRIG 70ML (MISCELLANEOUS) IMPLANT
TRACTIP FLEXIVA PULS ID 200XHI (Laser) IMPLANT
TRACTIP FLEXIVA PULSE ID 200 (Laser)
TUBE CONNECTING 12X1/4 (SUCTIONS) IMPLANT

## 2020-09-09 NOTE — Discharge Instructions (Signed)
CYSTOSCOPY HOME CARE INSTRUCTIONS ° °Activity: °Rest for the remainder of the day.  Do not drive or operate equipment today.  You may resume normal activities in one to two days as instructed by your physician.  ° °Meals: °Drink plenty of liquids and eat light foods such as gelatin or soup this evening.  You may return to a normal meal plan tomorrow. ° °Return to Work: °You may return to work in one to two days or as instructed by your physician. ° °Special Instructions / Symptoms: °Call your physician if any of these symptoms occur: ° ° -persistent or heavy bleeding ° -bleeding which continues after first few urination ° -large blood clots that are difficult to pass ° -urine stream diminishes or stops completely ° -fever equal to or higher than 101 degrees Farenheit. ° -cloudy urine with a strong, foul odor ° -severe pain ° °Females should always wipe from front to back after elimination.  You may feel some burning pain when you urinate.  This should disappear with time.  Applying moist heat to the lower abdomen or a hot tub bath may help relieve the pain.  ° ° °Post Anesthesia Home Care Instructions ° °Activity: °Get plenty of rest for the remainder of the day. A responsible individual must stay with you for 24 hours following the procedure.  °For the next 24 hours, DO NOT: °-Drive a car °-Operate machinery °-Drink alcoholic beverages °-Take any medication unless instructed by your physician °-Make any legal decisions or sign important papers. ° °Meals: °Start with liquid foods such as gelatin or soup. Progress to regular foods as tolerated. Avoid greasy, spicy, heavy foods. If nausea and/or vomiting occur, drink only clear liquids until the nausea and/or vomiting subsides. Call your physician if vomiting continues. ° °Special Instructions/Symptoms: °Your throat may feel dry or sore from the anesthesia or the breathing tube placed in your throat during surgery. If this causes discomfort, gargle with warm salt  water. The discomfort should disappear within 24 hours. ° ° °   ° ° ° °

## 2020-09-09 NOTE — Transfer of Care (Signed)
Immediate Anesthesia Transfer of Care Note  Patient: Robert Anderson  Procedure(s) Performed: TRANSURETHRAL RESECTION OF BLADDER TUMOR (TURBT) (N/A Bladder) CYSTOSCOPY WITH RETROGRADE PYELOGRAM (Bilateral Ureter)  Patient Location: PACU  Anesthesia Type:General  Level of Consciousness: awake, patient cooperative and responds to stimulation  Airway & Oxygen Therapy: Patient Spontanous Breathing  Post-op Assessment: Report given to RN and Post -op Vital signs reviewed and stable  Post vital signs: Reviewed and stable Bilat hearing aids in ears.  Last Vitals:  Vitals Value Taken Time  BP 118/58 09/09/20 1248  Temp    Pulse 60 09/09/20 1249  Resp 16 09/09/20 1249  SpO2 90 % 09/09/20 1249  Vitals shown include unvalidated device data.  Last Pain:  Vitals:   09/09/20 1027  TempSrc: Oral  PainSc: 0-No pain      Patients Stated Pain Goal: 4 (93/26/71 2458)  Complications: No complications documented.

## 2020-09-09 NOTE — Anesthesia Procedure Notes (Signed)
Procedure Name: LMA Insertion Date/Time: 09/09/2020 11:57 AM Performed by: Rogers Blocker, CRNA Pre-anesthesia Checklist: Patient identified, Emergency Drugs available, Suction available and Patient being monitored Patient Re-evaluated:Patient Re-evaluated prior to induction Oxygen Delivery Method: Circle System Utilized Preoxygenation: Pre-oxygenation with 100% oxygen Induction Type: IV induction Ventilation: Mask ventilation without difficulty LMA: LMA inserted LMA Size: 5.0 Number of attempts: 1 Placement Confirmation: positive ETCO2 Tube secured with: Tape Dental Injury: Teeth and Oropharynx as per pre-operative assessment

## 2020-09-09 NOTE — Op Note (Signed)
Operative report  Preop diagnosis: Recurrent bladder cancer Postop diagnosis: Same Procedure: Cystoscopy, bilateral retrograde pyelogram with intraoperative interpretation, transurethral section of bladder tumor (medium size) Surgeon: Milford Cage Anesthesia: General Estimated blood loss: Minimal Operative findings 2 recurrent areas right posterior lateral dome area on the right areas were removed utilizing rigid biopsy forceps and base fulgurated.  Total surface area was 2 to 2-1/2 cm in size. Operative note: After obtaining informed consent for the patient is taken the major cystoscopy suite placed under general anesthesia.  Placed in the dorsolithotomy position genitalia prepped and in usual sterile fashion.  Proper pause and timeout was performed prior to the procedure.  The 80 [address] was then advanced in the bladder under direct vision without difficulty.  Patient had trilobar prostatic hypertrophy with approximate 2-1/2 to 3 cm prostatic urethral length.  Slightly elevated median lobe.  Bladder was thoroughly spread with 30 and 70 degree lenses.  In the right posterior lateral aspect of the dome area there were 2 papillary recurrent areas with conglomerate surface area of about 2 cm.  They were very difficult to visualize with the 30 degree lens.  In order to preserve pathologic integrity from cautery artifact it was best felt that these would be removed utilizing rigid biopsy forceps rather than loop resection.  Utilizing anterior pressure on the abdominal wall I was able to get the lesions to be visualized.  Utilizing the rigid biopsy forceps and 30 degree lens I was able to completely remove both of the areas and then the bases were cauterized using the Bugbee electrode with good hemostasis noted.  The area of the biopsies and/or removal of the tumor was cauterized as well as the surrounding area adjacent to the sites.  No recurrent tumors were seen.  There was some erythema in the region of the prior  TURBT site but these appear to be vascular in origin did not have a malignant appearance.  Attention was then directed towards retrograde pyelograms.  Utilizing 5 French open tip catheter the retrogrades were performed with prompt filling of both upper tracts without evidence of obvious filling defect or hydronephrosis.  Both upper tracts emptied out promptly upon removal of the retrograde catheter.  The bladder was emptied and the TURBT sites were again inspected with good hemostasis noted.  The scope was removed and the procedure was terminated.  He was awakened from anesthesia and taken back to the recovery room in stable condition.  No immediate complication from the procedure.

## 2020-09-09 NOTE — Progress Notes (Signed)
Call to Dr. Milford Cage to clarify lovenox and coumadin instructions.  Instructions to discontinue lovenox and resume coumadin on Thursday verified and understood by patient and his spouse.

## 2020-09-09 NOTE — Anesthesia Preprocedure Evaluation (Addendum)
Anesthesia Evaluation  Patient identified by MRN, date of birth, ID band Patient awake    Reviewed: Allergy & Precautions, NPO status , Patient's Chart, lab work & pertinent test results, reviewed documented beta blocker date and time   Airway Mallampati: II  TM Distance: >3 FB Neck ROM: Limited    Dental  (+) Dental Advisory Given, Teeth Intact, Missing   Pulmonary    Pulmonary exam normal breath sounds clear to auscultation       Cardiovascular hypertension, Pt. on home beta blockers + dysrhythmias Atrial Fibrillation + pacemaker  Rhythm:Regular Rate:Normal  - s/p TAVR   1. Severe asymmetric septal hypertrophy up to 22 mm consistent with  history of hypertrophic cardiomyopathy. No LVOT obstruction or SAM of the  mitral valve. Left ventricular ejection fraction, by estimation, is 65 to  70%. The left ventricle has normal  function. The left ventricle has no regional wall motion abnormalities.  There is severe asymmetric left ventricular hypertrophy of the septal  segment. Left ventricular diastolic parameters are consistent with Grade  III diastolic dysfunction  (restrictive).  2. Right ventricular systolic function is normal. The right ventricular  size is normal. Tricuspid regurgitation signal is inadequate for assessing  PA pressure.  3. Left atrial size was mildly dilated.  4. The mitral valve is grossly normal. Mild mitral valve regurgitation.  No evidence of mitral stenosis.  5. 29 mm S3 in aortic position. Vmax 2.0 m/s, MG 8.0 mmHG, EOA 3.22 cm2,  DI 0.71. No paravalvular leak. The aortic valve has been  repaired/replaced. Aortic valve regurgitation is not visualized. Procedure  Date: 03/25/2015. Echo findings are consistent  with normal structure and function of the aortic valve prosthesis.  6. The inferior vena cava is normal in size with greater than 50%  respiratory variability, suggesting right atrial  pressure of 3 mmHg   Neuro/Psych TIACVA negative psych ROS   GI/Hepatic negative GI ROS, Neg liver ROS,   Endo/Other  negative endocrine ROS  Renal/GU negative Renal ROS   Bladder tumor    Musculoskeletal  (+) Arthritis ,   Abdominal   Peds negative pediatric ROS (+)  Hematology negative hematology ROS (+)   Anesthesia Other Findings On coumadin/Lovenox  Reproductive/Obstetrics                         Anesthesia Physical  Anesthesia Plan  ASA: III  Anesthesia Plan: General   Post-op Pain Management:    Induction: Intravenous  PONV Risk Score and Plan: 3 and Ondansetron, Treatment may vary due to age or medical condition and Dexamethasone  Airway Management Planned: LMA  Additional Equipment: None  Intra-op Plan:   Post-operative Plan: Extubation in OR  Informed Consent: I have reviewed the patients History and Physical, chart, labs and discussed the procedure including the risks, benefits and alternatives for the proposed anesthesia with the patient or authorized representative who has indicated his/her understanding and acceptance.     Dental advisory given  Plan Discussed with: CRNA  Anesthesia Plan Comments:       Anesthesia Quick Evaluation

## 2020-09-09 NOTE — Interval H&P Note (Signed)
History and Physical Interval Note:  09/09/2020 10:33 AM  Robert Anderson  has presented today for surgery, with the diagnosis of BLADDER CANCER.  The various methods of treatment have been discussed with the patient and family. After consideration of risks, benefits and other options for treatment, the patient has consented to  Procedure(s) with comments: TRANSURETHRAL RESECTION OF BLADDER TUMOR (TURBT) (N/A) - 30 MINS CYSTOSCOPY WITH RETROGRADE PYELOGRAM (Bilateral) as a surgical intervention.  The patient's history has been reviewed, patient examined, no change in status, stable for surgery.  I have reviewed the patient's chart and labs.  Questions were answered to the patient's satisfaction.     Remi Haggard

## 2020-09-09 NOTE — Anesthesia Postprocedure Evaluation (Signed)
Anesthesia Post Note  Patient: Robert Anderson  Procedure(s) Performed: TRANSURETHRAL RESECTION OF BLADDER TUMOR (TURBT) (N/A Bladder) CYSTOSCOPY WITH RETROGRADE PYELOGRAM (Bilateral Ureter)     Patient location during evaluation: PACU Anesthesia Type: General Level of consciousness: sedated and patient cooperative Pain management: pain level controlled Vital Signs Assessment: post-procedure vital signs reviewed and stable Respiratory status: spontaneous breathing Cardiovascular status: stable Anesthetic complications: no   No complications documented.  Last Vitals:  Vitals:   09/09/20 1322 09/09/20 1329  BP:  (!) 146/74  Pulse:  60  Resp:    Temp: (!) 36.4 C   SpO2:  93%    Last Pain:  Vitals:   09/09/20 1329  TempSrc:   PainSc: 0-No pain                 Nolon Nations

## 2020-09-10 ENCOUNTER — Encounter (HOSPITAL_BASED_OUTPATIENT_CLINIC_OR_DEPARTMENT_OTHER): Payer: Self-pay | Admitting: Urology

## 2020-09-10 LAB — SURGICAL PATHOLOGY

## 2020-09-15 ENCOUNTER — Ambulatory Visit (INDEPENDENT_AMBULATORY_CARE_PROVIDER_SITE_OTHER): Payer: Medicare Other

## 2020-09-15 ENCOUNTER — Other Ambulatory Visit: Payer: Self-pay

## 2020-09-15 DIAGNOSIS — Z79899 Other long term (current) drug therapy: Secondary | ICD-10-CM | POA: Diagnosis not present

## 2020-09-15 DIAGNOSIS — Z5181 Encounter for therapeutic drug level monitoring: Secondary | ICD-10-CM | POA: Diagnosis not present

## 2020-09-15 DIAGNOSIS — Z952 Presence of prosthetic heart valve: Secondary | ICD-10-CM

## 2020-09-15 DIAGNOSIS — Z7901 Long term (current) use of anticoagulants: Secondary | ICD-10-CM

## 2020-09-15 LAB — POCT INR: INR: 1.3 — AB (ref 2.0–3.0)

## 2020-09-15 NOTE — Patient Instructions (Signed)
Take 2 tablets tonight only and then continue 1 tablet daily except 1/2 tablet on Wednesday and Saturday Repeat INR 4 weeks

## 2020-09-24 ENCOUNTER — Ambulatory Visit (INDEPENDENT_AMBULATORY_CARE_PROVIDER_SITE_OTHER): Payer: Medicare Other

## 2020-09-24 DIAGNOSIS — R001 Bradycardia, unspecified: Secondary | ICD-10-CM | POA: Diagnosis not present

## 2020-09-26 LAB — CUP PACEART REMOTE DEVICE CHECK
Battery Remaining Longevity: 100 mo
Battery Remaining Percentage: 89 %
Battery Voltage: 2.95 V
Brady Statistic AP VP Percent: 68 %
Brady Statistic AP VS Percent: 1 %
Brady Statistic AS VP Percent: 32 %
Brady Statistic AS VS Percent: 1 %
Brady Statistic RA Percent Paced: 67 %
Brady Statistic RV Percent Paced: 99 %
Date Time Interrogation Session: 20220208031839
Implantable Lead Implant Date: 20161108
Implantable Lead Implant Date: 20161108
Implantable Lead Location: 753859
Implantable Lead Location: 753860
Implantable Pulse Generator Implant Date: 20161108
Lead Channel Impedance Value: 410 Ohm
Lead Channel Impedance Value: 460 Ohm
Lead Channel Pacing Threshold Amplitude: 0.75 V
Lead Channel Pacing Threshold Amplitude: 0.75 V
Lead Channel Pacing Threshold Pulse Width: 0.5 ms
Lead Channel Pacing Threshold Pulse Width: 0.5 ms
Lead Channel Sensing Intrinsic Amplitude: 10.4 mV
Lead Channel Sensing Intrinsic Amplitude: 5 mV
Lead Channel Setting Pacing Amplitude: 1 V
Lead Channel Setting Pacing Amplitude: 2 V
Lead Channel Setting Pacing Pulse Width: 0.5 ms
Lead Channel Setting Sensing Sensitivity: 5 mV
Pulse Gen Model: 2240
Pulse Gen Serial Number: 7825693

## 2020-09-30 NOTE — Progress Notes (Signed)
Remote pacemaker transmission.   

## 2020-10-13 ENCOUNTER — Other Ambulatory Visit: Payer: Self-pay

## 2020-10-13 ENCOUNTER — Ambulatory Visit (INDEPENDENT_AMBULATORY_CARE_PROVIDER_SITE_OTHER): Payer: Medicare Other

## 2020-10-13 DIAGNOSIS — Z79899 Other long term (current) drug therapy: Secondary | ICD-10-CM

## 2020-10-13 DIAGNOSIS — Z5181 Encounter for therapeutic drug level monitoring: Secondary | ICD-10-CM | POA: Diagnosis not present

## 2020-10-13 DIAGNOSIS — Z952 Presence of prosthetic heart valve: Secondary | ICD-10-CM

## 2020-10-13 DIAGNOSIS — Z7901 Long term (current) use of anticoagulants: Secondary | ICD-10-CM

## 2020-10-13 LAB — POCT INR: INR: 2 (ref 2.0–3.0)

## 2020-10-13 NOTE — Patient Instructions (Signed)
continue 1 tablet daily except 1/2 tablet on Wednesday and Saturday Repeat INR 6 weeks

## 2020-11-04 ENCOUNTER — Other Ambulatory Visit: Payer: Self-pay

## 2020-11-04 ENCOUNTER — Encounter: Payer: Self-pay | Admitting: Adult Health

## 2020-11-04 ENCOUNTER — Ambulatory Visit (INDEPENDENT_AMBULATORY_CARE_PROVIDER_SITE_OTHER): Payer: Medicare Other | Admitting: Adult Health

## 2020-11-04 VITALS — BP 122/72 | HR 41 | Temp 97.4°F | Wt 230.8 lb

## 2020-11-04 DIAGNOSIS — E785 Hyperlipidemia, unspecified: Secondary | ICD-10-CM

## 2020-11-04 DIAGNOSIS — I10 Essential (primary) hypertension: Secondary | ICD-10-CM | POA: Diagnosis not present

## 2020-11-04 DIAGNOSIS — Z952 Presence of prosthetic heart valve: Secondary | ICD-10-CM

## 2020-11-04 DIAGNOSIS — Z8551 Personal history of malignant neoplasm of bladder: Secondary | ICD-10-CM

## 2020-11-04 DIAGNOSIS — I442 Atrioventricular block, complete: Secondary | ICD-10-CM

## 2020-11-04 DIAGNOSIS — Z0000 Encounter for general adult medical examination without abnormal findings: Secondary | ICD-10-CM | POA: Diagnosis not present

## 2020-11-04 DIAGNOSIS — I4891 Unspecified atrial fibrillation: Secondary | ICD-10-CM

## 2020-11-04 LAB — COMPREHENSIVE METABOLIC PANEL
ALT: 14 U/L (ref 0–53)
AST: 16 U/L (ref 0–37)
Albumin: 4 g/dL (ref 3.5–5.2)
Alkaline Phosphatase: 69 U/L (ref 39–117)
BUN: 18 mg/dL (ref 6–23)
CO2: 28 mEq/L (ref 19–32)
Calcium: 9.6 mg/dL (ref 8.4–10.5)
Chloride: 105 mEq/L (ref 96–112)
Creatinine, Ser: 0.92 mg/dL (ref 0.40–1.50)
GFR: 71.3 mL/min (ref >60.00)
Glucose, Bld: 109 mg/dL — ABNORMAL HIGH (ref 70–99)
Potassium: 4.5 mEq/L (ref 3.5–5.1)
Sodium: 141 mEq/L (ref 135–145)
Total Bilirubin: 0.7 mg/dL (ref 0.2–1.2)
Total Protein: 6.1 g/dL (ref 6.0–8.3)

## 2020-11-04 LAB — CBC WITH DIFFERENTIAL/PLATELET
Basophils Absolute: 0 10*3/uL (ref 0.0–0.1)
Basophils Relative: 0.6 % (ref 0.0–3.0)
Eosinophils Absolute: 0.1 10*3/uL (ref 0.0–0.7)
Eosinophils Relative: 1.5 % (ref 0.0–5.0)
HCT: 45.2 % (ref 39.0–52.0)
Hemoglobin: 14.9 g/dL (ref 13.0–17.0)
Lymphocytes Relative: 23.9 % (ref 12.0–46.0)
Lymphs Abs: 1.6 10*3/uL (ref 0.7–4.0)
MCHC: 32.9 g/dL (ref 30.0–36.0)
MCV: 88.6 fl (ref 78.0–100.0)
Monocytes Absolute: 0.6 10*3/uL (ref 0.1–1.0)
Monocytes Relative: 9.5 % (ref 3.0–12.0)
Neutro Abs: 4.2 10*3/uL (ref 1.4–7.7)
Neutrophils Relative %: 64.5 % (ref 43.0–77.0)
Platelets: 176 10*3/uL (ref 150.0–400.0)
RBC: 5.1 Mil/uL (ref 4.22–5.81)
RDW: 14.4 % (ref 11.5–15.5)
WBC: 6.5 10*3/uL (ref 4.0–10.5)

## 2020-11-04 LAB — LIPID PANEL
Cholesterol: 139 mg/dL (ref 0–200)
HDL: 42.3 mg/dL (ref >39.00)
NonHDL: 96.61
Total CHOL/HDL Ratio: 3
Triglycerides: 204 mg/dL — ABNORMAL HIGH (ref 0.0–149.0)
VLDL: 40.8 mg/dL — ABNORMAL HIGH (ref 0.0–40.0)

## 2020-11-04 LAB — TSH: TSH: 3.61 u[IU]/mL (ref 0.35–4.50)

## 2020-11-04 LAB — LDL CHOLESTEROL, DIRECT: Direct LDL: 69 mg/dL

## 2020-11-04 NOTE — Progress Notes (Signed)
Subjective:    Patient ID: [name], male    DOB: 08-Aug-1924, 87 y.o.   MRN: [id number]  HPI  Patient presents for yearly preventative medicine examination. 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   Aortic valve disease-s/p TAVR in 2016.  He is managed by cardiology.  Denies chest pain, shortness of breath, DOE, dizziness, or palpitations.  PAF -rate controlled with Toprol 25 mg daily and chronic anticoagulation with Coumadin.  Complete heart block- has pacemaker.  Followed by cardiology on a routine basis.  Essential Hypertension - Controlled with Toprol 25 mg daily.   Hyperlipidemia - takes Crestor 5 mg daily.  He does have a history of statin intolerance. Denies myalgia or fatigue  Lab Results  Component Value Date   CHOL 222 (H) 11/01/2019   HDL 38.50 (L) 11/01/2019   LDLCALC 136 (H) 07/29/2016   LDLDIRECT 143.0 11/01/2019   TRIG 240.0 (H) 11/01/2019   CHOLHDL 6 11/01/2019    Bladder Cancer-was diagnosed with bladder cancer in September 2021 and underwent cystoscopy followed by 6 weeks of chemotherapy which he completed without any adverse effects.  He had transurethral resection of his bladder tumor in January 2022.  Has a follow-up appointment with urology for another cystoscopy in a few months.   Allergy currently has him on Flomax and Myrbetriq.  All immunizations and health maintenance protocols were reviewed with the patient and needed orders were placed.  Appropriate screening laboratory values were ordered for the patient including screening of hyperlipidemia, renal function and hepatic function. If indicated by BPH, a PSA was ordered.  Medication reconciliation,  past medical history, social history, problem list and allergies were reviewed in detail with the patient  Goals were established with regard to weight loss, exercise, and  diet in compliance with medications. He is riding stationary bicycle about 14 minutes a day, building himself up to 20 mill minutes per day.  He is also doing sit ups daily  End of life planning was discussed. He has an advanced directive and living will.   Review of Systems  Constitutional: Negative.   HENT: Negative.   Eyes: Negative.   Respiratory: Negative.   Cardiovascular: Negative.   Gastrointestinal: Negative.   Endocrine: Negative.   Genitourinary: Negative.   Musculoskeletal: Positive for arthralgias, back pain and gait problem.  Skin: Negative.   Allergic/Immunologic: Negative.   Hematological: Negative.   Psychiatric/Behavioral: Negative.   All other systems reviewed and are negative.  Past Medical History:  Diagnosis Date  . Atrial fibrillation ([place])    a. dx after lead revision 06/26/15 >> anticoag not started due to recent pericardial effusion in setting of perforation and lead revision  . Bladder cancer ([place])    chemo done 2021  . Bradycardia    a. HR 30s in clinic 06/2015 - BB discontinued.  . DEGENERATIVE JOINT DISEASE, SPINE   . ERECTILE DYSFUNCTION   . Essential hypertension   . First degree AV block   . Hearing  deficit    wears hearing aides both ears  . HEARING LOSS   . Heart block    s/p Pacemaker  . Hyperlipidemia   . Hypertrophic obstructive cardiomyopathy ([place])   . LBBB (left bundle branch block)   .  Macular degeneration    left eye gets shots for  . OA (osteoarthritis)    both hips  . Pericardial effusion    a. micorpeforation s/p lead revision 06/26/15  . Presence of permanent cardiac pacemaker 06/24/2015   St Jude Assurity  . Prostate cancer [facility]) 2010   "external radiation; 40 treatments"  . 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 Severe aortic stenosis    a. s/p TAVR 03/2015. Pre-op  LHC 02/2015: minor nonobstructive CAD.  Marland Kitchen Squamous cell skin cancer    left lower leg  . Stroke ([place]) 02/19/2014   Small infarct high posterior left frontal lobe on MRI    . TIA (transient ischemic attack) 01/21/2014   5 min spell aphasia without assoc sx    . Uses walker   . Wears glasses     Social History   Socioeconomic History  . Marital status: Married    Spouse name: Not on file  . Number of children: 4  . Years of education: 66  . Highest education level: Not on file  Occupational History  . Occupation: Retired  Tobacco Use  . Smoking status: Never Smoker  . Smokeless tobacco: Never Used  Vaping Use  . Vaping Use: Never used  Substance and Sexual Activity  . Alcohol use: No    Alcohol/week: 0.0 standard drinks  . Drug use: No  . Sexual activity: Not Currently  Other Topics Concern  . Not on file  Social History Narrative   Patient lives in [place] with his wife. 2nd marriage    4 children    He goes to the [facility] three days a week      09/27/18: Lives at [facility] with wife   Still goes to [facility], 2X/week, bicycles/uses sitting eliptical in room daily   Enjoys singing, part of singing group at center   Active in discussion groups at center         Social Determinants of Health   Financial Resource Strain: Not on file  Food Insecurity: Not on file  Transportation Needs: Not on file  Physical Activity: Not on file  Stress: Not on file   Social Connections: Not on file  Intimate Partner Violence: Not on file    Past Surgical History:  Procedure Laterality Date  . ANTERIOR CERVICAL DECOMP/DISCECTOMY FUSION  ~ 2000   Dr. Louanne Skye with bone graft  . CARDIAC CATHETERIZATION N/A 02/20/2015   Procedure: Right/Left Heart Cath and Coronary Angiography;  Surgeon: Sherren Mocha, MD;  Location: [place] [facility];  Service: Cardiovascular;  Laterality: N/A;  . CARDIAC VALVE REPLACEMENT    . CATARACT EXTRACTION W/ INTRAOCULAR LENS  IMPLANT, BILATERAL  2004; 2010   right; left  . CYSTOSCOPY W/ RETROGRADES Bilateral 04/22/2020   Procedure: CYSTOSCOPY WITH RETROGRADE PYELOGRAM/FULGERATION BLADDER BIOPSY;  Surgeon: Remi Haggard, MD;  [facility];  Service: Urology;  Laterality: Bilateral;  . CYSTOSCOPY W/ RETROGRADES Bilateral 09/09/2020   Procedure: CYSTOSCOPY WITH RETROGRADE PYELOGRAM;  Surgeon: Remi Haggard, MD;  Location: [facility];  Service: Urology;  Laterality: Bilateral;  . EP IMPLANTABLE DEVICE N/A 06/24/2015  Procedure: Pacemaker Implant;  Surgeon: Evans Lance, MD;  Location: [place] [facility];  Service: Cardiovascular; St Jude Assurity   . EP IMPLANTABLE DEVICE N/A 06/26/2015   Procedure: Lead Revision/Repair;  Surgeon: Will Meredith Leeds, MD;  Location: [place] [facility];  Service: Cardiovascular;  Laterality: N/A;  . INSERT / REPLACE / REMOVE PACEMAKER  06/24/2015  . JOINT REPLACEMENT    . SQUAMOUS CELL CARCINOMA EXCISION Left X 3   "lower leg"  . TEE WITHOUT CARDIOVERSION N/A 03/07/2015   Procedure: TRANSESOPHAGEAL ECHOCARDIOGRAM (TEE);  Surgeon: Dorothy Spark, MD;  Location: [place];  Service: Cardiovascular;  Laterality: N/A;  . TEE WITHOUT CARDIOVERSION N/A 03/25/2015   Procedure: TRANSESOPHAGEAL ECHOCARDIOGRAM (TEE);  Surgeon: Burnell Blanks, MD;  Location: [place];  Service: Open Heart Surgery;  Laterality: N/A;  . TONSILLECTOMY  as child  . TOTAL SHOULDER ARTHROPLASTY  Right 2007   "wore it out playing tennis"  . TRANSCATHETER AORTIC VALVE REPLACEMENT, TRANSFEMORAL N/A 03/25/2015   Procedure: TRANSCATHETER AORTIC VALVE REPLACEMENT, TRANSFEMORAL;  Surgeon: Burnell Blanks, MD;  Location: [place];  Service: Open Heart Surgery;  Laterality: N/A;  . TRANSURETHRAL RESECTION OF BLADDER TUMOR N/A 09/09/2020   Procedure: TRANSURETHRAL RESECTION OF BLADDER TUMOR (TURBT);  Surgeon: Remi Haggard, MD;  Location: [facility];  Service: Urology;  Laterality: N/A;  30 MINS    Family History  Problem Relation Age of Onset  . Heart disease Mother   . CVA Mother   . Stroke Mother   . CVA Father   . Cancer Father   . Brain cancer Sister   . Prostate cancer Brother   . Heart disease Brother   . Lung cancer Brother   . Heart attack Brother   . Hypertension Neg Hx     No Known Allergies  Current Outpatient Medications on File Prior to Visit  Medication Sig Dispense Refill  . acetaminophen (TYLENOL) 500 MG tablet Take 500 mg by mouth every 6 (six) hours as needed.    Marland Kitchen amoxicillin (AMOXIL) 500 MG capsule Take 2,000 mg by mouth See admin instructions. Take 2000 mg 1 hour prior to dental work    . docusate sodium (COLACE) 100 MG capsule Take 300 mg by mouth daily with lunch.     . metoprolol succinate (TOPROL-XL) 25 MG 24 hr tablet TAKE 1 TABLET BY MOUTH EVERY DAY (Patient taking differently: daily.) 90 tablet 1  . mirabegron ER (MYRBETRIQ) 50 MG TB24 tablet Take 50 mg by mouth every evening.     . Multiple Vitamins-Minerals (PRESERVISION AREDS 2+MULTI VIT PO) Take 1 tablet by mouth 2 (two) times daily.    . Omega-3 Fatty Acids (FISH OIL) 1000 MG CAPS Take 1,000 mg by mouth 2 (two) times daily.    Marland Kitchen PREVIDENT 5000 SENSITIVE 1.1-5 % PSTE Apply 1 application topically at bedtime.     . rosuvastatin (CRESTOR) 5 MG tablet TAKE 1 TABLET BY MOUTH EVERY DAY (Patient taking differently: every other day. In pm) 90 tablet 1  . tamsulosin (FLOMAX) 0.4 MG CAPS  capsule Take 0.4 mg by mouth daily after supper.     . WARFARIN SODIUM PO Take by mouth. 1.5 mg Wednesday , Saturday 3 mg all other days    . enoxaparin (LOVENOX) 100 MG/ML injection Inject 1 mL (100 mg total) into the skin every 12 (twelve) hours. (Patient not taking: Reported on 11/04/2020) 20 mL 0   No current facility-administered medications on file prior to visit.  BP 122/72 (BP Location: Left Arm, Patient Position: Sitting, Cuff Size: Normal)   Pulse (!) 41   Temp (!) 97.4 F (36.3 C) (Oral)   Wt 230 lb 12.8 oz (104.7 kg)   SpO2 96%   BMI 34.08 kg/m       Objective:   Physical Exam Vitals and nursing note reviewed.  Constitutional:      Appearance: Normal appearance.  HENT:     Head: Normocephalic and atraumatic.     Nose: Nose normal.     Mouth/Throat:     Mouth: Mucous membranes are moist.     Pharynx: Oropharynx is clear.  Eyes:     Extraocular Movements: Extraocular movements intact.     Pupils: Pupils are equal, round, and reactive to light.  Cardiovascular:     Rate and Rhythm: Normal rate and regular rhythm.     Pulses: Normal pulses.     Heart sounds: Normal heart sounds.  Pulmonary:     Effort: Pulmonary effort is normal.     Breath sounds: Normal breath sounds.  Abdominal:     General: Abdomen is flat.     Palpations: Abdomen is soft.  Musculoskeletal:        General: Normal range of motion.  Skin:    General: Skin is warm and dry.     Capillary Refill: Capillary refill takes less than 2 seconds.  Neurological:     General: No focal deficit present.     Mental Status: He is alert and oriented to person, place, and time.     Gait: Gait abnormal (slow steay gait with walker).  Psychiatric:        Mood and Affect: Mood normal.        Behavior: Behavior normal.        Thought Content: Thought content normal.        Judgment: Judgment normal.        Assessment & Plan:  1. Routine general medical examination at a health care facility -  continue to stay active and eat healthy  - CBC with Differential/Platelet; Future - Comprehensive metabolic panel; Future - Lipid panel; Future - TSH; Future - CBC with Differential/Platelet - Comprehensive metabolic panel - Lipid panel - TSH  2. Essential hypertension - Well controlled.  - CBC with Differential/Platelet; Future - Comprehensive metabolic panel; Future - Lipid panel; Future - TSH; Future - CBC with Differential/Platelet - Comprehensive metabolic panel - Lipid panel - TSH  3. Hyperlipidemia, unspecified hyperlipidemia type - Continue with Lipitor 5 mg  - CBC with Differential/Platelet; Future - Comprehensive metabolic panel; Future - Lipid panel; Future - TSH; Future - CBC with Differential/Platelet - Comprehensive metabolic panel - Lipid panel - TSH  4. Complete heart block ([place]) - Follow up with Cardiology as directed - CBC with Differential/Platelet; Future - Comprehensive metabolic panel; Future - Lipid panel; Future - TSH; Future - CBC with Differential/Platelet - Comprehensive metabolic panel - Lipid panel - TSH  5. S/P TAVR (transcatheter aortic valve replacement) - Follow up with Cardiology as directed - CBC with Differential/Platelet; Future - Comprehensive metabolic panel; Future - Lipid panel; Future - TSH; Future - CBC with Differential/Platelet - Comprehensive metabolic panel - Lipid panel - TSH  6. Atrial fibrillation, unspecified type ([facility]) - Continue Toprol and Coumadin  - CBC with Differential/Platelet; Future - Comprehensive metabolic panel; Future - Lipid panel; Future - TSH; Future - CBC with Differential/Platelet - Comprehensive metabolic panel - Lipid panel - TSH  7. History  of bladder cancer - Follow up with Urology as directed  Dorothyann Peng, NP

## 2020-11-04 NOTE — Patient Instructions (Signed)
It was great seeing you today!  I think you are doing very well  We will follow up with you regarding your blood work

## 2020-11-11 ENCOUNTER — Other Ambulatory Visit: Payer: Self-pay | Admitting: Cardiology

## 2020-11-11 DIAGNOSIS — I10 Essential (primary) hypertension: Secondary | ICD-10-CM

## 2020-11-11 DIAGNOSIS — Z952 Presence of prosthetic heart valve: Secondary | ICD-10-CM

## 2020-11-11 DIAGNOSIS — E785 Hyperlipidemia, unspecified: Secondary | ICD-10-CM

## 2020-11-24 ENCOUNTER — Ambulatory Visit (INDEPENDENT_AMBULATORY_CARE_PROVIDER_SITE_OTHER): Payer: Medicare Other

## 2020-11-24 ENCOUNTER — Other Ambulatory Visit: Payer: Self-pay

## 2020-11-24 DIAGNOSIS — Z7901 Long term (current) use of anticoagulants: Secondary | ICD-10-CM

## 2020-11-24 DIAGNOSIS — Z952 Presence of prosthetic heart valve: Secondary | ICD-10-CM

## 2020-11-24 DIAGNOSIS — Z79899 Other long term (current) drug therapy: Secondary | ICD-10-CM

## 2020-11-24 DIAGNOSIS — Z5181 Encounter for therapeutic drug level monitoring: Secondary | ICD-10-CM

## 2020-11-24 LAB — POCT INR: INR: 1.9 — AB (ref 2.0–3.0)

## 2020-11-24 NOTE — Patient Instructions (Signed)
Take 2 tablets tonight only and then continue 1 tablet daily except 1/2 tablet on Wednesday and Saturday Repeat INR 6 weeks

## 2020-11-28 ENCOUNTER — Encounter: Payer: Self-pay | Admitting: Internal Medicine

## 2020-11-28 ENCOUNTER — Ambulatory Visit: Payer: Medicare Other | Admitting: Internal Medicine

## 2020-11-28 ENCOUNTER — Other Ambulatory Visit: Payer: Self-pay

## 2020-11-28 VITALS — BP 138/68 | HR 64 | Ht 68.0 in | Wt 232.0 lb

## 2020-11-28 DIAGNOSIS — I442 Atrioventricular block, complete: Secondary | ICD-10-CM | POA: Diagnosis not present

## 2020-11-28 DIAGNOSIS — I4891 Unspecified atrial fibrillation: Secondary | ICD-10-CM

## 2020-11-28 DIAGNOSIS — Z950 Presence of cardiac pacemaker: Secondary | ICD-10-CM | POA: Diagnosis not present

## 2020-11-28 NOTE — Progress Notes (Signed)
HPI Robert Anderson returns today for ongoing evaluation of his PPM. He is a pleasant 87 yo man with CHB, s/p PPM insertion who is living at [facility]. He feels well and is exercising regularly, typically about 15 minutes a day. No chest pain or sob. No edema.He has undergone TAVR. He feels well with no chest pain. He does admit to being a bit more sedentary. No other symptoms though he notes that he feels "lazy" at times.  No Known Allergies   Current Outpatient Medications  Medication Sig Dispense Refill  . acetaminophen (TYLENOL) 500 MG tablet Take 500 mg by mouth every 6 (six) hours as needed.    Marland Kitchen amoxicillin (AMOXIL) 500 MG capsule Take 2,000 mg by mouth See admin instructions. Take 2000 mg 1 hour prior to dental work    . docusate sodium (COLACE) 100 MG capsule Take 300 mg by mouth daily with lunch.     . metoprolol succinate (TOPROL-XL) 25 MG 24 hr tablet TAKE 1 TABLET BY MOUTH EVERY DAY (Patient taking differently: daily.) 90 tablet 1  . mirabegron ER (MYRBETRIQ) 50 MG TB24 tablet Take 50 mg by mouth every evening.     . Multiple Vitamins-Minerals (PRESERVISION AREDS 2+MULTI VIT PO) Take 1 tablet by mouth 2 (two) times daily.    . Omega-3 Fatty Acids (FISH OIL) 1000 MG CAPS Take 1,000 mg by mouth 2 (two) times daily.    Marland Kitchen PREVIDENT 5000 SENSITIVE 1.1-5 % PSTE Apply 1 application topically at bedtime.     . rosuvastatin (CRESTOR) 5 MG tablet TAKE 1 TABLET BY MOUTH EVERY DAY 90 tablet 2  . tamsulosin (FLOMAX) 0.4 MG CAPS capsule Take 0.4 mg by mouth daily after supper.     . WARFARIN SODIUM PO Take by mouth. 1.5 mg Wednesday , Saturday 3 mg all other days     No current facility-administered medications for this visit.     Past Medical History:  Diagnosis Date  . Atrial fibrillation ([place])    a. dx after lead revision 06/26/15 >> anticoag not started due to recent pericardial effusion in setting of perforation and lead revision  . Bladder cancer ([place])    chemo done 2021  .  Bradycardia    a. HR 30s in clinic 06/2015 - BB discontinued.  . DEGENERATIVE JOINT DISEASE, SPINE   . ERECTILE DYSFUNCTION   . Essential hypertension   . First degree AV block   . Hearing deficit    wears hearing aides both ears  . HEARING LOSS   . Heart block    s/p Pacemaker  . Hyperlipidemia   . Hypertrophic obstructive cardiomyopathy ([place])   . LBBB (left bundle branch block)   . Macular degeneration    left eye gets shots for  . OA (osteoarthritis)    both hips  . Pericardial effusion    a. micorpeforation s/p lead revision 06/26/15  . Presence of permanent cardiac pacemaker 06/24/2015   St Jude Assurity  . Prostate cancer [facility]) 2010   "external radiation; 40 treatments"  . 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 Severe aortic stenosis    a. s/p TAVR 03/2015. Pre-op  LHC 02/2015: minor nonobstructive CAD.  Marland Kitchen Squamous cell skin cancer    left lower leg  . Stroke ([place]) 02/19/2014   Small infarct high posterior left frontal lobe on MRI    .  TIA (transient ischemic attack) 01/21/2014   5 min spell aphasia without assoc sx    . Uses walker   . Wears glasses     ROS:   All systems reviewed and negative except as noted in the HPI.   Past Surgical History:  Procedure Laterality Date  . ANTERIOR CERVICAL DECOMP/DISCECTOMY FUSION  ~ 2000   Dr. Louanne Skye with bone graft  . CARDIAC CATHETERIZATION N/A 02/20/2015   Procedure: Right/Left Heart Cath and Coronary Angiography;  Surgeon: Sherren Mocha, MD;  Location: [place] [facility];  Service: Cardiovascular;  Laterality: N/A;  . CARDIAC VALVE REPLACEMENT    . CATARACT EXTRACTION W/ INTRAOCULAR LENS  IMPLANT, BILATERAL  2004; 2010   right; left  . CYSTOSCOPY W/ RETROGRADES Bilateral 04/22/2020   Procedure: CYSTOSCOPY WITH RETROGRADE PYELOGRAM/FULGERATION BLADDER BIOPSY;  Surgeon: Remi Haggard, MD;  [facility];   Service: Urology;  Laterality: Bilateral;  . CYSTOSCOPY W/ RETROGRADES Bilateral 09/09/2020   Procedure: CYSTOSCOPY WITH RETROGRADE PYELOGRAM;  Surgeon: Remi Haggard, MD;  Location: [facility];  Service: Urology;  Laterality: Bilateral;  . EP IMPLANTABLE DEVICE N/A 06/24/2015   Procedure: Pacemaker Implant;  Surgeon: Evans Lance, MD;  Location: [place] [facility];  Service: Cardiovascular; St Jude Assurity   . EP IMPLANTABLE DEVICE N/A 06/26/2015   Procedure: Lead Revision/Repair;  Surgeon: Will Meredith Leeds, MD;  Location: [place] [facility];  Service: Cardiovascular;  Laterality: N/A;  . INSERT / REPLACE / REMOVE PACEMAKER  06/24/2015  . JOINT REPLACEMENT    . SQUAMOUS CELL CARCINOMA EXCISION Left X 3   "lower leg"  . TEE WITHOUT CARDIOVERSION N/A 03/07/2015   Procedure: TRANSESOPHAGEAL ECHOCARDIOGRAM (TEE);  Surgeon: Dorothy Spark, MD;  Location: [place];  Service: Cardiovascular;  Laterality: N/A;  . TEE WITHOUT CARDIOVERSION N/A 03/25/2015   Procedure: TRANSESOPHAGEAL ECHOCARDIOGRAM (TEE);  Surgeon: Burnell Blanks, MD;  Location: [place];  Service: Open Heart Surgery;  Laterality: N/A;  . TONSILLECTOMY  as child  . TOTAL SHOULDER ARTHROPLASTY Right 2007   "wore it out playing tennis"  . TRANSCATHETER AORTIC VALVE REPLACEMENT, TRANSFEMORAL N/A 03/25/2015   Procedure: TRANSCATHETER AORTIC VALVE REPLACEMENT, TRANSFEMORAL;  Surgeon: Burnell Blanks, MD;  Location: [place];  Service: Open Heart Surgery;  Laterality: N/A;  . TRANSURETHRAL RESECTION OF BLADDER TUMOR N/A 09/09/2020   Procedure: TRANSURETHRAL RESECTION OF BLADDER TUMOR (TURBT);  Surgeon: Remi Haggard, MD;  Location: [facility];  Service: Urology;  Laterality: N/A;  30 MINS     Family History  Problem Relation Age of Onset  . Heart disease Mother   . CVA Mother   . Stroke Mother   . CVA Father   . Cancer Father   . Brain cancer Sister   . Prostate cancer Brother    . Heart disease Brother   . Lung cancer Brother   . Heart attack Brother   . Hypertension Neg Hx      Social History   Socioeconomic History  . Marital status: Married    Spouse name: Not on file  . Number of children: 4  . Years of education: 16  . Highest education level: Not on file  Occupational History  . Occupation: Retired  Tobacco Use  . Smoking status: Never Smoker  . Smokeless tobacco: Never Used  Vaping Use  . Vaping Use: Never used  Substance and Sexual Activity  . Alcohol use: No    Alcohol/week: 0.0 standard drinks  .  Drug use: No  . Sexual activity: Not Currently  Other Topics Concern  . Not on file  Social History Narrative   Patient lives in [place] with his wife. 2nd marriage    4 children    He goes to the [facility] three days a week      09/27/18: Lives at [facility] with wife   Still goes to [facility], 2X/week, bicycles/uses sitting eliptical in room daily   Enjoys singing, part of singing group at center   Active in discussion groups at center         Social Determinants of Health   Financial Resource Strain: Not on file  Food Insecurity: Not on file  Transportation Needs: Not on file  Physical Activity: Not on file  Stress: Not on file  Social Connections: Not on file  Intimate Partner Violence: Not on file     BP 138/68   Pulse 64   Ht 5\' 8"  (1.727 m)   Wt 232 lb (105.2 kg)   SpO2 97%   BMI 35.28 kg/m   Physical Exam:  Well appearing elderly man, NAD HEENT: Unremarkable Neck:  No JVD, no thyromegally Lymphatics:  No adenopathy Back:  No CVA tenderness Lungs:  Clear with no wheezes HEART:  Regular rate rhythm, no murmurs, no rubs, no clicks Abd:  soft, positive bowel sounds, no organomegally, no rebound, no guarding Ext:  2 plus pulses, no edema, no cyanosis, no clubbing Skin:  No rashes no nodules Neuro:  CN II through XII intact, motor grossly intact  DEVICE  Normal device function.  See PaceArt for details.    Assess/Plan: 1. Atrial fib - his VR is well controlled 2. CHB - He is asymptomatic, s/p catheter ablation. No escape. 3. PPM -his St. Jude DDD PM is working normally.   Carleene Overlie Persephanie Laatsch,MD

## 2020-11-28 NOTE — Patient Instructions (Signed)
Medication Instructions:  Your physician recommends that you continue on your current medications as directed. Please refer to the Current Medication list given to you today.  Labwork: None ordered.  Testing/Procedures: None ordered.  Follow-Up: Your physician wants you to follow-up in: one year with Cristopher Peru, MD or one of the following Advanced Practice Providers on your designated Care Team:    Chanetta Marshall, NP  Tommye Standard, PA-C  Legrand Como "Jonni Sanger" [place], [place]  Remote monitoring is used to monitor your Pacemaker from home. This monitoring reduces the number of office visits required to check your device to one time per year. It allows [place] to keep an eye on the functioning of your device to ensure it is working properly. You are scheduled for a device check from home on 12/24/2020. You may send your transmission at any time that day. If you have a wireless device, the transmission will be sent automatically. After your physician reviews your transmission, you will receive a postcard with your next transmission date.  Any Other Special Instructions Will Be Listed Below (If Applicable).  If you need a refill on your cardiac medications before your next appointment, please call your pharmacy.

## 2020-12-08 ENCOUNTER — Other Ambulatory Visit: Payer: Self-pay | Admitting: Cardiology

## 2020-12-08 DIAGNOSIS — I10 Essential (primary) hypertension: Secondary | ICD-10-CM

## 2020-12-08 DIAGNOSIS — Z952 Presence of prosthetic heart valve: Secondary | ICD-10-CM

## 2020-12-23 LAB — CUP PACEART REMOTE DEVICE CHECK
Battery Remaining Longevity: 100 mo
Battery Remaining Percentage: 89 %
Battery Voltage: 2.95 V
Brady Statistic AP VP Percent: 59 %
Brady Statistic AP VS Percent: 1 %
Brady Statistic AS VP Percent: 41 %
Brady Statistic AS VS Percent: 1 %
Brady Statistic RA Percent Paced: 57 %
Brady Statistic RV Percent Paced: 99 %
Date Time Interrogation Session: 20220510020013
Implantable Lead Implant Date: 20161108
Implantable Lead Implant Date: 20161108
Implantable Lead Location: 753859
Implantable Lead Location: 753860
Implantable Pulse Generator Implant Date: 20161108
Lead Channel Impedance Value: 400 Ohm
Lead Channel Impedance Value: 450 Ohm
Lead Channel Pacing Threshold Amplitude: 0.75 V
Lead Channel Pacing Threshold Amplitude: 0.75 V
Lead Channel Pacing Threshold Pulse Width: 0.5 ms
Lead Channel Pacing Threshold Pulse Width: 0.5 ms
Lead Channel Sensing Intrinsic Amplitude: 12 mV
Lead Channel Sensing Intrinsic Amplitude: 5 mV
Lead Channel Setting Pacing Amplitude: 1 V
Lead Channel Setting Pacing Amplitude: 2 V
Lead Channel Setting Pacing Pulse Width: 0.5 ms
Lead Channel Setting Sensing Sensitivity: 5 mV
Pulse Gen Model: 2240
Pulse Gen Serial Number: 7825693

## 2020-12-24 ENCOUNTER — Ambulatory Visit (INDEPENDENT_AMBULATORY_CARE_PROVIDER_SITE_OTHER): Payer: Medicare Other

## 2020-12-24 DIAGNOSIS — I442 Atrioventricular block, complete: Secondary | ICD-10-CM

## 2021-01-05 ENCOUNTER — Other Ambulatory Visit: Payer: Self-pay

## 2021-01-05 ENCOUNTER — Ambulatory Visit (INDEPENDENT_AMBULATORY_CARE_PROVIDER_SITE_OTHER): Payer: Medicare Other

## 2021-01-05 DIAGNOSIS — Z79899 Other long term (current) drug therapy: Secondary | ICD-10-CM | POA: Diagnosis not present

## 2021-01-05 DIAGNOSIS — Z952 Presence of prosthetic heart valve: Secondary | ICD-10-CM

## 2021-01-05 DIAGNOSIS — Z5181 Encounter for therapeutic drug level monitoring: Secondary | ICD-10-CM | POA: Diagnosis not present

## 2021-01-05 LAB — POCT INR: INR: 2.4 (ref 2.0–3.0)

## 2021-01-05 NOTE — Patient Instructions (Signed)
continue 1 tablet daily except 1/2 tablet on Wednesday and Saturday Repeat INR 5 weeks

## 2021-01-15 NOTE — Progress Notes (Signed)
Remote pacemaker transmission.   

## 2021-01-26 ENCOUNTER — Other Ambulatory Visit: Payer: Self-pay | Admitting: Pharmacist

## 2021-01-26 MED ORDER — WARFARIN SODIUM 3 MG PO TABS: ORAL_TABLET | ORAL | 0 refills | Status: DC

## 2021-02-05 NOTE — Progress Notes (Signed)
Cardiology Office Note   Date:  02/09/2021   ID:  Robert Anderson, DOB 26-Sep-1923, MRN [id number]  PCP:  Shirline Frees, NP  Cardiologist:   Melaney Tellefsen [place], MD   Chief Complaint  Patient presents with   Follow-up    6 months.   Atrial Fibrillation      History of Present Illness: Robert Anderson is a 87 y.o. male who presents for follow up heart block and prior TAVR. Previously followed by Dr Delton See. 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 He has recurrent bladder CA and is s/p chemotherapy and resection. Echo in January this year showed marked basal septal hypertrophy without outflow gradient. Normal EF. Normal functioning AV prosthesis. Pacemaker check 12/23/20 was normal. Followed by Dr Ladona Ridgel.   He is living at [facility]. Retired from [employer]. Originally from [place]. Has 4 children by his first marriage. Married again 38 years ago. Generally feels very well. Rides his stationary bike 10 minutes in the morning and 5 in the evening. Does 100 crunches. Walking is limited. Denies any chest pain, dyspnea, palpitations or dizziness. Feels like he is doing well.  Past Medical History:  Diagnosis Date   Atrial fibrillation [facility])    a. dx after lead revision 06/26/15 >> anticoag not started due to recent pericardial effusion in setting of perforation and lead revision   Bladder cancer (HCC)    chemo done 2021   Bradycardia    a. HR 30s in clinic 06/2015 - BB discontinued.   DEGENERATIVE JOINT DISEASE, SPINE    ERECTILE DYSFUNCTION    Essential hypertension    First degree AV block    Hearing deficit    wears hearing aides both ears   HEARING LOSS    Heart block    s/p Pacemaker   Hyperlipidemia    Hypertrophic obstructive cardiomyopathy (HCC)    LBBB  (left bundle branch block)    Macular degeneration    left eye gets shots for   OA (osteoarthritis)    both hips   Pericardial effusion    a. micorpeforation s/p lead revision 06/26/15   Presence of permanent cardiac pacemaker 06/24/2015   St Jude Assurity   Prostate cancer [facility]) 2010   "external radiation; 40 treatments"   S/P TAVR (transcatheter aortic valve replacement) 03/25/2015   29 mm Edwards Sapien 3 transcatheter heart valve placed via open right transfemoral approach   Severe aortic stenosis    a. s/p TAVR 03/2015. Pre-op  LHC 02/2015: minor nonobstructive CAD.   Squamous cell skin cancer    left lower leg   Stroke (HCC) 02/19/2014   Small infarct high posterior left frontal lobe on MRI     TIA (transient ischemic attack) 01/21/2014   5 min spell aphasia without assoc sx     Uses walker    Wears glasses     Past Surgical History:  Procedure Laterality Date   ANTERIOR CERVICAL DECOMP/DISCECTOMY FUSION  ~ 2000   Dr. Otelia Sergeant with bone graft   CARDIAC CATHETERIZATION N/A 02/20/2015   Procedure: Right/Left Heart Cath and Coronary Angiography;  Surgeon: Tonny Bollman, MD;  Location: [facility] INVASIVE CV LAB;  Service: Cardiovascular;  Laterality: N/A;   CARDIAC VALVE REPLACEMENT     CATARACT EXTRACTION W/ INTRAOCULAR LENS  IMPLANT, BILATERAL  2004; 2010   right; left   CYSTOSCOPY W/ RETROGRADES Bilateral 04/22/2020   Procedure: CYSTOSCOPY WITH RETROGRADE PYELOGRAM/FULGERATION BLADDER BIOPSY;  Surgeon: Belva Agee, MD;  Location: [facility] ORS;  Service: Urology;  Laterality: Bilateral;   CYSTOSCOPY W/ RETROGRADES Bilateral 09/09/2020   Procedure: CYSTOSCOPY WITH RETROGRADE PYELOGRAM;  Surgeon: Belva Agee, MD;  Location: [facility];  Service: Urology;  Laterality: Bilateral;   EP IMPLANTABLE DEVICE N/A 06/24/2015   Procedure: Pacemaker Implant;  Surgeon: Marinus Maw, MD;  Location: [facility] INVASIVE CV LAB;  Service: Cardiovascular; St Jude Assurity    EP IMPLANTABLE DEVICE  N/A 06/26/2015   Procedure: Lead Revision/Repair;  Surgeon: Will Jorja Loa, MD;  Location: [facility] INVASIVE CV LAB;  Service: Cardiovascular;  Laterality: N/A;   INSERT / REPLACE / REMOVE PACEMAKER  06/24/2015   JOINT REPLACEMENT     SQUAMOUS CELL CARCINOMA EXCISION Left X 3   "lower leg"   TEE WITHOUT CARDIOVERSION N/A 03/07/2015   Procedure: TRANSESOPHAGEAL ECHOCARDIOGRAM (TEE);  Surgeon: Lars Masson, MD;  Location: [facility] ENDOSCOPY;  Service: Cardiovascular;  Laterality: N/A;   TEE WITHOUT CARDIOVERSION N/A 03/25/2015   Procedure: TRANSESOPHAGEAL ECHOCARDIOGRAM (TEE);  Surgeon: Kathleene Hazel, MD;  Location: [facility] OR;  Service: Open Heart Surgery;  Laterality: N/A;   TONSILLECTOMY  as child   TOTAL SHOULDER ARTHROPLASTY Right 2007   "wore it out playing tennis"   TRANSCATHETER AORTIC VALVE REPLACEMENT, TRANSFEMORAL N/A 03/25/2015   Procedure: TRANSCATHETER AORTIC VALVE REPLACEMENT, TRANSFEMORAL;  Surgeon: Kathleene Hazel, MD;  Location: [facility] OR;  Service: Open Heart Surgery;  Laterality: N/A;   TRANSURETHRAL RESECTION OF BLADDER TUMOR N/A 09/09/2020   Procedure: TRANSURETHRAL RESECTION OF BLADDER TUMOR (TURBT);  Surgeon: Belva Agee, MD;  Location: [facility];  Service: Urology;  Laterality: N/A;  30 MINS     Current Outpatient Medications  Medication Sig Dispense Refill   acetaminophen (TYLENOL) 500 MG tablet Take 500 mg by mouth every 6 (six) hours as needed.     amoxicillin (AMOXIL) 500 MG capsule Take 2,000 mg by mouth See admin instructions. Take 2000 mg 1 hour prior to dental work     docusate sodium (COLACE) 100 MG capsule Take 300 mg by mouth daily with lunch.      metoprolol succinate (TOPROL-XL) 25 MG 24 hr tablet TAKE 1 TABLET BY MOUTH EVERY DAY 90 tablet 3   mirabegron ER (MYRBETRIQ) 50 MG TB24 tablet Take 50 mg by mouth every evening.      Multiple Vitamins-Minerals (PRESERVISION AREDS 2+MULTI VIT PO) Take 1 tablet by mouth 2 (two) times daily.      Omega-3 Fatty Acids (FISH OIL) 1000 MG CAPS Take 1,000 mg by mouth 2 (two) times daily.     PREVIDENT 5000 SENSITIVE 1.1-5 % PSTE Apply 1 application topically at bedtime.      rosuvastatin (CRESTOR) 5 MG tablet TAKE 1 TABLET BY MOUTH EVERY DAY 90 tablet 2   tamsulosin (FLOMAX) 0.4 MG CAPS capsule Take 0.4 mg by mouth daily after supper.      warfarin (COUMADIN) 3 MG tablet Take 1.5 mg on Wednesday and Saturday, 3 mg all other days or as directed by coumadin clinic 90 tablet 0   No current facility-administered medications for this visit.    Allergies:   Patient has no known allergies.    Social History:  The patient  reports that he has never smoked. He has never used smokeless tobacco. He reports that he does  not drink alcohol and does not use drugs.   Family History:  The patient's family history includes Brain cancer in his sister; CVA in his father and mother; Cancer in his father; Heart attack in his brother; Heart disease in his brother and mother; Lung cancer in his brother; Prostate cancer in his brother; Stroke in his mother.    ROS:  Please see the history of present illness.   Otherwise, review of systems are positive for none.   All other systems are reviewed and negative.    PHYSICAL EXAM: VS:  BP 122/70 (BP Location: Left Arm, Patient Position: Sitting, Cuff Size: Normal)   Pulse 69   Ht 5\' 8"  (1.727 m)   Wt 233 lb (105.7 kg)   BMI 35.43 kg/m  , BMI Body mass index is 35.43 kg/m. GEN: Well nourished, well developed, in no acute distress HEENT: normal Neck: no JVD, carotid bruits, or masses Cardiac: RRR; no murmurs, rubs, or gallops,no edema  Respiratory:  clear to auscultation bilaterally, normal work of breathing GI: soft, nontender, nondistended, + BS MS: no deformity or atrophy Skin: warm and dry, no rash Neuro:  Strength and sensation are intact Psych: euthymic mood, full affect   EKG:  EKG is not ordered today. The ekg ordered today demonstrates  N/A   Recent Labs: 11/04/2020: ALT 14; BUN 18; Creatinine, Ser 0.92; Hemoglobin 14.9; Platelets 176.0; Potassium 4.5; Sodium 141; TSH 3.61    Lipid Panel    Component Value Date/Time   CHOL 139 11/04/2020 1101   TRIG 204.0 (H) 11/04/2020 1101   HDL 42.30 11/04/2020 1101   CHOLHDL 3 11/04/2020 1101   VLDL 40.8 (H) 11/04/2020 1101   LDLCALC 136 (H) 07/29/2016 0855   LDLDIRECT 69.0 11/04/2020 1101      Wt Readings from Last 3 Encounters:  02/09/21 233 lb (105.7 kg)  11/28/20 232 lb (105.2 kg)  11/04/20 230 lb 12.8 oz (104.7 kg)      Other studies Reviewed: Additional studies/ records that were reviewed today include:   Echo 08/26/20: IMPRESSIONS     1. Severe asymmetric septal hypertrophy up to 22 mm consistent with  history of hypertrophic cardiomyopathy. No LVOT obstruction or SAM of the  mitral valve. Left ventricular ejection fraction, by estimation, is 65 to  70%. The left ventricle has normal  function. The left ventricle has no regional wall motion abnormalities.  There is severe asymmetric left ventricular hypertrophy of the septal  segment. Left ventricular diastolic parameters are consistent with Grade  III diastolic dysfunction  (restrictive).   2. Right ventricular systolic function is normal. The right ventricular  size is normal. Tricuspid regurgitation signal is inadequate for assessing  PA pressure.   3. Left atrial size was mildly dilated.   4. The mitral valve is grossly normal. Mild mitral valve regurgitation.  No evidence of mitral stenosis.   5. 29 mm S3 in aortic position. Vmax 2.0 m/s, MG 8.0 mmHG, EOA 3.22 cm2,  DI 0.71. No paravalvular leak. The aortic valve has been  repaired/replaced. Aortic valve regurgitation is not visualized. Procedure  Date: 03/25/2015. Echo findings are consistent  with normal structure and function of the aortic valve prosthesis.   6. The inferior vena cava is normal in size with greater than 50%  respiratory  variability, suggesting right atrial pressure of 3 mmHg.   Comparison(s): No significant change from prior study.    ASSESSMENT AND PLAN:  1.  Status post TAVR 2016 last echo Jan 2022 functioning  normally. He is asymptomatic. Aware of SBE prophylaxis.    2. Paroxysmal atrial fibrillation on Coumadin and Toprol, no recent episodes of palpitations, he is compliant with his warfarin. Check INR today   3. Hypertrophic nonobstructive cardiomyopathy    4. Essential hypertension controlled on current management   5. Permanent pacemaker followed by Dr. Ladona Ridgel - placed for high degree AV block following TAVR   6. Bladder cancer -status post chemo and resection. Anticipates one more cystoscopy    7. Hyperlipidemia, on low-dose of rosuvastatin 5 mg daily. LDL at goal 69   Current medicines are reviewed at length with the patient today.  The patient does not have concerns regarding medicines.  The following changes have been made:  no change  Labs/ tests ordered today include:  No orders of the defined types were placed in this encounter.    Disposition:   FU with me in 6 months  Signed, Francisco Ostrovsky [place], MD  02/09/2021 1:16 PM    [facility] [address], [place], [place], [address] Phone [phone], Fax [phone]

## 2021-02-09 ENCOUNTER — Ambulatory Visit (INDEPENDENT_AMBULATORY_CARE_PROVIDER_SITE_OTHER): Payer: Medicare Other | Admitting: Cardiology

## 2021-02-09 ENCOUNTER — Encounter: Payer: Self-pay | Admitting: Cardiology

## 2021-02-09 ENCOUNTER — Ambulatory Visit (INDEPENDENT_AMBULATORY_CARE_PROVIDER_SITE_OTHER): Payer: Medicare Other | Admitting: Pharmacist

## 2021-02-09 ENCOUNTER — Other Ambulatory Visit: Payer: Self-pay

## 2021-02-09 VITALS — BP 122/70 | HR 69 | Ht 68.0 in | Wt 233.0 lb

## 2021-02-09 DIAGNOSIS — Z79899 Other long term (current) drug therapy: Secondary | ICD-10-CM | POA: Diagnosis not present

## 2021-02-09 DIAGNOSIS — Z5181 Encounter for therapeutic drug level monitoring: Secondary | ICD-10-CM

## 2021-02-09 DIAGNOSIS — I480 Paroxysmal atrial fibrillation: Secondary | ICD-10-CM

## 2021-02-09 DIAGNOSIS — I447 Left bundle-branch block, unspecified: Secondary | ICD-10-CM

## 2021-02-09 DIAGNOSIS — I442 Atrioventricular block, complete: Secondary | ICD-10-CM

## 2021-02-09 DIAGNOSIS — I10 Essential (primary) hypertension: Secondary | ICD-10-CM

## 2021-02-09 DIAGNOSIS — I4891 Unspecified atrial fibrillation: Secondary | ICD-10-CM | POA: Diagnosis not present

## 2021-02-09 DIAGNOSIS — Z952 Presence of prosthetic heart valve: Secondary | ICD-10-CM | POA: Diagnosis not present

## 2021-02-09 DIAGNOSIS — Z950 Presence of cardiac pacemaker: Secondary | ICD-10-CM

## 2021-02-09 LAB — POCT INR: INR: 2.4 (ref 2.0–3.0)

## 2021-03-23 ENCOUNTER — Ambulatory Visit (INDEPENDENT_AMBULATORY_CARE_PROVIDER_SITE_OTHER): Payer: Medicare Other

## 2021-03-23 ENCOUNTER — Other Ambulatory Visit: Payer: Self-pay

## 2021-03-23 DIAGNOSIS — Z79899 Other long term (current) drug therapy: Secondary | ICD-10-CM | POA: Diagnosis not present

## 2021-03-23 DIAGNOSIS — Z5181 Encounter for therapeutic drug level monitoring: Secondary | ICD-10-CM | POA: Diagnosis not present

## 2021-03-23 DIAGNOSIS — Z7901 Long term (current) use of anticoagulants: Secondary | ICD-10-CM

## 2021-03-23 DIAGNOSIS — Z952 Presence of prosthetic heart valve: Secondary | ICD-10-CM

## 2021-03-23 LAB — POCT INR: INR: 2.7 (ref 2.0–3.0)

## 2021-03-23 NOTE — Patient Instructions (Signed)
continue 1 tablet daily except 1/2 tablet on Wednesday and Saturday Repeat INR 6 weeks

## 2021-03-25 ENCOUNTER — Ambulatory Visit (INDEPENDENT_AMBULATORY_CARE_PROVIDER_SITE_OTHER): Payer: Medicare Other

## 2021-03-25 DIAGNOSIS — I442 Atrioventricular block, complete: Secondary | ICD-10-CM | POA: Diagnosis not present

## 2021-03-26 LAB — CUP PACEART REMOTE DEVICE CHECK
Battery Remaining Longevity: 42 mo
Battery Remaining Percentage: 38 %
Battery Voltage: 2.95 V
Brady Statistic AP VP Percent: 66 %
Brady Statistic AP VS Percent: 1 %
Brady Statistic AS VP Percent: 34 %
Brady Statistic AS VS Percent: 1 %
Brady Statistic RA Percent Paced: 63 %
Brady Statistic RV Percent Paced: 99 %
Date Time Interrogation Session: 20220809020021
Implantable Lead Implant Date: 20161108
Implantable Lead Implant Date: 20161108
Implantable Lead Location: 753859
Implantable Lead Location: 753860
Implantable Pulse Generator Implant Date: 20161108
Lead Channel Impedance Value: 410 Ohm
Lead Channel Impedance Value: 450 Ohm
Lead Channel Pacing Threshold Amplitude: 0.625 V
Lead Channel Pacing Threshold Amplitude: 0.75 V
Lead Channel Pacing Threshold Pulse Width: 0.5 ms
Lead Channel Pacing Threshold Pulse Width: 0.5 ms
Lead Channel Sensing Intrinsic Amplitude: 12 mV
Lead Channel Sensing Intrinsic Amplitude: 5 mV
Lead Channel Setting Pacing Amplitude: 0.875
Lead Channel Setting Pacing Amplitude: 2 V
Lead Channel Setting Pacing Pulse Width: 0.5 ms
Lead Channel Setting Sensing Sensitivity: 5 mV
Pulse Gen Model: 2240
Pulse Gen Serial Number: 7825693

## 2021-04-16 NOTE — Progress Notes (Signed)
Remote pacemaker transmission.   

## 2021-04-25 ENCOUNTER — Other Ambulatory Visit: Payer: Self-pay | Admitting: Internal Medicine

## 2021-05-04 ENCOUNTER — Ambulatory Visit: Payer: Medicare Other

## 2021-05-04 ENCOUNTER — Other Ambulatory Visit: Payer: Self-pay

## 2021-05-04 DIAGNOSIS — Z5181 Encounter for therapeutic drug level monitoring: Secondary | ICD-10-CM

## 2021-05-04 DIAGNOSIS — Z79899 Other long term (current) drug therapy: Secondary | ICD-10-CM

## 2021-05-04 DIAGNOSIS — Z952 Presence of prosthetic heart valve: Secondary | ICD-10-CM

## 2021-05-04 LAB — POCT INR: INR: 2.6 (ref 2.0–3.0)

## 2021-05-04 NOTE — Patient Instructions (Signed)
continue 1 tablet daily except 1/2 tablet on Wednesday and Saturday Repeat INR 6 weeks

## 2021-05-19 ENCOUNTER — Other Ambulatory Visit: Payer: Self-pay | Admitting: Cardiology

## 2021-06-15 ENCOUNTER — Other Ambulatory Visit: Payer: Self-pay

## 2021-06-15 ENCOUNTER — Ambulatory Visit: Payer: Medicare Other

## 2021-06-15 DIAGNOSIS — Z79899 Other long term (current) drug therapy: Secondary | ICD-10-CM | POA: Diagnosis not present

## 2021-06-15 DIAGNOSIS — Z5181 Encounter for therapeutic drug level monitoring: Secondary | ICD-10-CM | POA: Diagnosis not present

## 2021-06-15 DIAGNOSIS — Z952 Presence of prosthetic heart valve: Secondary | ICD-10-CM

## 2021-06-15 LAB — POCT INR: INR: 2.9 (ref 2.0–3.0)

## 2021-06-15 NOTE — Patient Instructions (Signed)
continue 1 tablet daily except 1/2 tablet on Wednesday and Saturday Repeat INR 6 weeks

## 2021-06-16 ENCOUNTER — Other Ambulatory Visit: Payer: Self-pay | Admitting: Cardiology

## 2021-06-23 LAB — CUP PACEART REMOTE DEVICE CHECK
Battery Remaining Longevity: 38 mo
Battery Remaining Percentage: 36 %
Battery Voltage: 2.93 V
Brady Statistic AP VP Percent: 69 %
Brady Statistic AP VS Percent: 1 %
Brady Statistic AS VP Percent: 31 %
Brady Statistic AS VS Percent: 1 %
Brady Statistic RA Percent Paced: 66 %
Brady Statistic RV Percent Paced: 99 %
Date Time Interrogation Session: 20221108020017
Implantable Lead Implant Date: 20161108
Implantable Lead Implant Date: 20161108
Implantable Lead Location: 753859
Implantable Lead Location: 753860
Implantable Pulse Generator Implant Date: 20161108
Lead Channel Impedance Value: 410 Ohm
Lead Channel Impedance Value: 440 Ohm
Lead Channel Pacing Threshold Amplitude: 0.625 V
Lead Channel Pacing Threshold Amplitude: 0.75 V
Lead Channel Pacing Threshold Pulse Width: 0.5 ms
Lead Channel Pacing Threshold Pulse Width: 0.5 ms
Lead Channel Sensing Intrinsic Amplitude: 12 mV
Lead Channel Sensing Intrinsic Amplitude: 5 mV
Lead Channel Setting Pacing Amplitude: 0.875
Lead Channel Setting Pacing Amplitude: 2 V
Lead Channel Setting Pacing Pulse Width: 0.5 ms
Lead Channel Setting Sensing Sensitivity: 5 mV
Pulse Gen Model: 2240
Pulse Gen Serial Number: 7825693

## 2021-06-24 ENCOUNTER — Ambulatory Visit (INDEPENDENT_AMBULATORY_CARE_PROVIDER_SITE_OTHER): Payer: Medicare Other

## 2021-06-24 DIAGNOSIS — I442 Atrioventricular block, complete: Secondary | ICD-10-CM

## 2021-07-03 NOTE — Progress Notes (Signed)
Remote pacemaker transmission.   

## 2021-07-11 ENCOUNTER — Other Ambulatory Visit: Payer: Self-pay | Admitting: Cardiology

## 2021-07-27 ENCOUNTER — Other Ambulatory Visit: Payer: Self-pay

## 2021-07-27 ENCOUNTER — Ambulatory Visit: Payer: Medicare Other

## 2021-07-27 DIAGNOSIS — Z7901 Long term (current) use of anticoagulants: Secondary | ICD-10-CM

## 2021-07-27 DIAGNOSIS — I639 Cerebral infarction, unspecified: Secondary | ICD-10-CM | POA: Diagnosis not present

## 2021-07-27 DIAGNOSIS — Z952 Presence of prosthetic heart valve: Secondary | ICD-10-CM

## 2021-07-27 DIAGNOSIS — I4891 Unspecified atrial fibrillation: Secondary | ICD-10-CM

## 2021-07-27 DIAGNOSIS — Z79899 Other long term (current) drug therapy: Secondary | ICD-10-CM

## 2021-07-27 DIAGNOSIS — Z5181 Encounter for therapeutic drug level monitoring: Secondary | ICD-10-CM

## 2021-07-27 LAB — POCT INR: INR: 2.8 (ref 2.0–3.0)

## 2021-07-27 NOTE — Patient Instructions (Signed)
-   continue 1 tablet daily except 1/2 tablet on Wednesday and Saturday  - Repeat INR 6 weeks

## 2021-08-10 ENCOUNTER — Other Ambulatory Visit: Payer: Self-pay | Admitting: Cardiology

## 2021-08-11 ENCOUNTER — Other Ambulatory Visit: Payer: Self-pay

## 2021-08-11 DIAGNOSIS — E785 Hyperlipidemia, unspecified: Secondary | ICD-10-CM

## 2021-08-11 DIAGNOSIS — Z952 Presence of prosthetic heart valve: Secondary | ICD-10-CM

## 2021-08-11 DIAGNOSIS — I10 Essential (primary) hypertension: Secondary | ICD-10-CM

## 2021-08-11 MED ORDER — ROSUVASTATIN CALCIUM 5 MG PO TABS: 5.0000 mg | ORAL_TABLET | Freq: Every day | ORAL | 1 refills | Status: DC

## 2021-08-11 NOTE — Telephone Encounter (Signed)
Prescription refill request received for warfarin [place]: 02/09/21 ([place])  Next INR check: 09/07/21 Warfarin tablet strength: 3mg   Appropriate dose and refill sent to requested pharmacy.

## 2021-08-14 ENCOUNTER — Ambulatory Visit (INDEPENDENT_AMBULATORY_CARE_PROVIDER_SITE_OTHER): Payer: Medicare Other

## 2021-08-14 VITALS — Ht 68.0 in | Wt 233.0 lb

## 2021-08-14 DIAGNOSIS — Z0000 Encounter for general adult medical examination without abnormal findings: Secondary | ICD-10-CM | POA: Diagnosis not present

## 2021-08-14 NOTE — Progress Notes (Signed)
Subjective:   Robert Anderson is a 87 y.o. male who presents for Medicare Annual/Subsequent preventive examination.  Review of Systems    No ROS Cardiac Risk Factors include: advanced age (>52men, >26 women);hypertension    Objective:    Today's Vitals   08/14/21 1304  Weight: 233 lb (105.7 kg)  Height: 5\' 8"  (1.727 m)   Body mass index is 35.43 kg/m.  Advanced Directives 08/14/2021 09/09/2020 04/17/2020 09/27/2018 07/06/2015 06/25/2015 06/25/2015  Does Patient Have a Medical Advance Directive? Yes Yes Yes Yes Yes Yes Yes  Type of Paramedic of [place];Living will Healthcare Power of [place];Living will [place];Living will Living will;Healthcare Power of Attorney Living will;Healthcare Power of Attorney -  Does patient want to make changes to medical advance directive? No - Patient declined No - Patient declined - No - Patient declined No - Patient declined No - Patient declined -  Copy of [place] in Chart? No - copy requested - - No - copy requested No - copy requested No - copy requested No - copy requested    Current Medications (verified) Outpatient Encounter Medications as of 08/14/2021  Medication Sig   warfarin (COUMADIN) 3 MG tablet TAKE 1/2 TAB ON WEDNESDAY AND SATURDAY, 1 TAB ON ALL OTHER DAYS OR AS DIRECTED BY COUMADIN CLINIC   acetaminophen (TYLENOL) 500 MG tablet Take 500 mg by mouth every 6 (six) hours as needed.   amoxicillin (AMOXIL) 500 MG capsule Take 2,000 mg by mouth See admin instructions. Take 2000 mg 1 hour prior to dental work   docusate sodium (COLACE) 100 MG capsule Take 300 mg by mouth daily with lunch.    metoprolol succinate (TOPROL-XL) 25 MG 24 hr tablet TAKE 1 TABLET BY MOUTH EVERY DAY   mirabegron ER (MYRBETRIQ) 50 MG TB24 tablet Take 50 mg by mouth every evening.    Multiple Vitamins-Minerals (PRESERVISION AREDS 2+MULTI VIT PO) Take 1 tablet by mouth 2 (two) times  daily.   Omega-3 Fatty Acids ([place] OIL) 1000 MG CAPS Take 1,000 mg by mouth 2 (two) times daily.   PREVIDENT 5000 SENSITIVE 1.1-5 % PSTE Apply 1 application topically at bedtime.    rosuvastatin (CRESTOR) 5 MG tablet Take 1 tablet (5 mg total) by mouth daily.   tamsulosin (FLOMAX) 0.4 MG CAPS capsule Take 0.4 mg by mouth daily after supper.    No facility-administered encounter medications on file as of 08/14/2021.    Allergies (verified) Patient has no known allergies.   History: Past Medical History:  Diagnosis Date   Atrial fibrillation ([place])    a. dx after lead revision 06/26/15 >> anticoag not started due to recent pericardial effusion in setting of perforation and lead revision   Bladder cancer ([place])    chemo done 2021   Bradycardia    a. HR 30s in clinic 06/2015 - BB discontinued.   DEGENERATIVE JOINT DISEASE, SPINE    ERECTILE DYSFUNCTION    Essential hypertension    First degree AV block    Hearing deficit    wears hearing aides both ears   HEARING LOSS    Heart block    s/p Pacemaker   Hyperlipidemia    Hypertrophic obstructive cardiomyopathy (HCC)    LBBB (left bundle branch block)    Macular degeneration    left eye gets shots for   OA (osteoarthritis)    both hips   Pericardial effusion    a. 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 s/p TAVR 03/2015. Pre-op  LHC 02/2015: minor nonobstructive CAD.   Squamous cell skin cancer    left lower leg   Stroke ([place]) 02/19/2014   Small infarct high posterior left frontal lobe on MRI     TIA (transient ischemic attack) 01/21/2014   5 min spell aphasia without assoc sx     Uses walker    Wears  glasses    Past Surgical History:  Procedure Laterality Date   ANTERIOR CERVICAL DECOMP/DISCECTOMY FUSION  ~ 2000   Dr. Louanne Skye with bone graft   CARDIAC CATHETERIZATION N/A 02/20/2015   Procedure: Right/Left Heart Cath and Coronary Angiography;  Surgeon: Sherren Mocha, MD;  Location: [place] [facility];  Service: Cardiovascular;  Laterality: N/A;   CARDIAC VALVE REPLACEMENT     CATARACT EXTRACTION W/ INTRAOCULAR LENS  IMPLANT, BILATERAL  2004; 2010   right; left   CYSTOSCOPY W/ RETROGRADES Bilateral 04/22/2020   Procedure: CYSTOSCOPY WITH RETROGRADE PYELOGRAM/FULGERATION BLADDER BIOPSY;  Surgeon: Remi Haggard, MD;  Location: [facility];  Service: Urology;  Laterality: Bilateral;   CYSTOSCOPY W/ RETROGRADES Bilateral 09/09/2020   Procedure: CYSTOSCOPY WITH RETROGRADE PYELOGRAM;  Surgeon: Remi Haggard, MD;  Location: [facility];  Service: Urology;  Laterality: Bilateral;   EP IMPLANTABLE DEVICE N/A 06/24/2015   Procedure: Pacemaker Implant;  Surgeon: Evans Lance, MD;  Location: [place] [facility];  Service: Cardiovascular; St Jude Assurity    EP IMPLANTABLE DEVICE N/A 06/26/2015   Procedure: Lead Revision/Repair;  Surgeon: Will Meredith Leeds, MD;  Location: [place] [facility];  Service: Cardiovascular;  Laterality: N/A;   INSERT / REPLACE / REMOVE PACEMAKER  06/24/2015   JOINT REPLACEMENT     SQUAMOUS CELL CARCINOMA EXCISION Left X 3   "lower leg"   TEE WITHOUT CARDIOVERSION N/A 03/07/2015   Procedure: TRANSESOPHAGEAL ECHOCARDIOGRAM (TEE);  Surgeon: Dorothy Spark, MD;  Location: [facility];  Service: Cardiovascular;  Laterality: N/A;   TEE WITHOUT CARDIOVERSION N/A 03/25/2015   Procedure: TRANSESOPHAGEAL ECHOCARDIOGRAM (TEE);  Surgeon: Burnell Blanks, MD;  Location: [place];  Service: Open Heart Surgery;  Laterality: N/A;   TONSILLECTOMY  as child   TOTAL SHOULDER ARTHROPLASTY Right 2007   "wore it out playing tennis"   TRANSCATHETER AORTIC VALVE REPLACEMENT,  TRANSFEMORAL N/A 03/25/2015   Procedure: TRANSCATHETER AORTIC VALVE REPLACEMENT, TRANSFEMORAL;  Surgeon: Burnell Blanks, MD;  Location: [place];  Service: Open Heart Surgery;  Laterality: N/A;   TRANSURETHRAL RESECTION OF BLADDER TUMOR N/A 09/09/2020   Procedure: TRANSURETHRAL RESECTION OF BLADDER TUMOR (TURBT);  Surgeon: Remi Haggard, MD;  Location: [facility];  Service: Urology;  Laterality: N/A;  4 MINS   Family History  Problem Relation Age of Onset   Heart disease Mother    CVA Mother    Stroke Mother    CVA Father    Cancer Father    Brain cancer Sister    Prostate cancer Brother    Heart disease Brother    Lung cancer Brother    Heart attack Brother    Hypertension Neg Hx    Social History   Socioeconomic  History   Marital status: Married    Spouse name: Not on file   Number of children: 4   Years of education: 16   Highest education level: Not on file  Occupational History   Occupation: Retired  Tobacco Use   Smoking status: Never   Smokeless tobacco: Never  Vaping Use   Vaping Use: Never used  Substance and Sexual Activity   Alcohol use: No    Alcohol/week: 0.0 standard drinks   Drug use: No   Sexual activity: Not Currently  Other Topics Concern   Not on file  Social History Narrative   Patient lives in [place] with his wife. 2nd marriage    4 children    He goes to the [facility] three days a week      09/27/18: Lives at [facility] with wife   Still goes to [facility], 2X/week, bicycles/uses sitting eliptical in room daily   Enjoys singing, part of singing group at center   Active in discussion groups at center         Social Determinants of Health   Financial Resource Strain: High Risk   Difficulty of Paying Living Expenses: Very hard  Food Insecurity: No Food Insecurity   Worried About Charity fundraiser in the Last Year: Never true   [place] in the Last Year: Never true  Transportation Needs: No  Transportation Needs   Lack of Transportation (Medical): No   Lack of Transportation (Non-Medical): No  Physical Activity: Insufficiently Active   Days of Exercise per Week: 7 days   Minutes of Exercise per Session: 10 min  Stress: No Stress Concern Present   Feeling of Stress : Not at all  Social Connections: Socially Integrated   Frequency of Communication with Friends and Family: More than three times a week   Frequency of Social Gatherings with Friends and Family: More than three times a week   Attends Religious Services: More than 4 times per year   Active Member of [employer] or Organizations: Yes   Attends Archivist Meetings: More than 4 times per year   Marital Status: Married    Clinical Intake: How often do you need to have someone help you when you read instructions, pamphlets, or other written materials from your doctor or pharmacy?: 1 - Never  Diabetic? No  Interpreter Needed?: NoActivities of Daily Living In your present state of health, do you have any difficulty performing the following activities: 08/14/2021 09/09/2020  Hearing? N Y  Vision? N N  Difficulty concentrating or making decisions? N N  Walking or climbing stairs? N Y  Comment Patient uses a walker -  Dressing or bathing? - N  Conservation officer, nature and eating ? N -  Using the Toilet? N -  In the past six months, have you accidently leaked urine? N -  Do you have problems with loss of bowel control? N -  Managing your Medications? N -  Managing your Finances? N -  Housekeeping or managing your Housekeeping? N -  Some recent data might be hidden    Patient Care Team: Dorothyann Peng, NP as PCP - General (Family Medicine) Dorothy Spark, MD (Inactive) as PCP - Cardiology (Cardiology) Evans Lance, MD as PCP - Electrophysiology (Cardiology) Dorothy Spark, MD (Inactive) as Consulting Physician (Cardiology) Burnell Blanks, MD as Consulting Physician (Cardiology) Evans Lance, MD  as Consulting Physician (Clinical Cardiac Electrophysiology) Danella Sensing, MD as Consulting Physician (Dermatology) Ernst Breach  R, MD as Referring Physician (Ophthalmology) Kathie Rhodes, MD (Inactive) as Consulting Physician (Urology)  Indicate any recent Medical Services you may have received from other than [facility] providers in the past year (date may be approximate).     Assessment:   This is a routine wellness examination for Robert Anderson. Virtual Visit via Telephone Note  I connected with  Robert Anderson on 08/14/21 at  1:00 PM EST by telephone and verified that I am speaking with the correct person using two identifiers.  Location: Patient: Home Provider: Office Persons participating in the virtual visit: patient/Nurse Health Advisor   I discussed the limitations, risks, security and privacy concerns of performing an evaluation and management service by telephone and the availability of in person appointments. The patient expressed understanding and agreed to proceed.  Interactive audio and video telecommunications were attempted between this nurse and patient, however failed, due to patient having technical difficulties OR patient did not have access to video capability.  We continued and completed visit with audio only.  Some vital signs may be absent or patient reported.   [name], LPN   Hearing/Vision screen Hearing Screening - Comments:: Wears hearing aids Vision Screening - Comments:: Wears glasses. Followed by Dr Yolanda Bonine  Dietary issues and exercise activities discussed: Current Exercise Habits: Home exercise routine, Type of exercise: walking, Time (Minutes): 20, Frequency (Times/Week): 5, Weekly Exercise (Minutes/Week): 100, Intensity: Mild   Goals Addressed             This Visit's Progress    Patient Stated       Reduce simple sugar intake to help with triglycerides Keep doing what you're doing.       Depression Screen PHQ 2/9 Scores 08/14/2021 11/04/2020  09/27/2018 08/04/2017 05/16/2014 05/03/2013  PHQ - 2 Score 0 0 0 0 0 0  PHQ- 9 Score - - 1 - - -    Fall Risk Fall Risk  08/14/2021 11/04/2020 09/27/2018 08/04/2017 10/05/2016  Falls in the past year? 0 0 0 No No  Number falls in past yr: 0 - - - -  Injury with Fall? 0 - - - -  Risk for fall due to : - - - - -    FALL RISK PREVENTION PERTAINING TO THE HOME:  Any stairs in or around the home? Yes  If so, are there any without handrails? No  Home free of loose throw rugs in walkways, pet beds, electrical cords, etc? Yes  Adequate lighting in your home to reduce risk of falls? Yes   ASSISTIVE DEVICES UTILIZED TO PREVENT FALLS:  Life alert? No  Use of a cane, walker or w/c? Yes  Grab bars in the bathroom? Yes  Shower chair or bench in shower? Yes  Elevated toilet seat or a handicapped toilet? Yes   TIMED UP AND GO:  Was the test performed? No . Audio Visit  Cognitive Function:    Immunizations Immunization History  Administered Date(s) Administered   H1N1 09/10/2008   Influenza Whole 05/26/2007, 05/17/2008, 05/15/2009   Influenza, High Dose Seasonal PF 05/27/2015, 05/10/2017, 05/17/2018, 05/30/2019, 05/28/2020, 06/01/2021   Influenza,inj,Quad PF,6+ Mos 05/03/2013, 05/16/2014   Influenza-Unspecified 05/16/2016   Pfizer Covid-19 Vaccine Bivalent Booster 11yrs & up 06/07/2021   Pneumococcal Conjugate-13 05/16/2014   Pneumococcal Polysaccharide-23 08/16/2005   Td 08/16/2005, 05/27/2015   Zoster, Live 10/03/2008    Covid-19 vaccine status: Information provided on how to obtain vaccines.   Qualifies for Shingles Vaccine? Yes   Zostavax completed No  Shingrix Completed?: No.    Education has been provided regarding the importance of this vaccine. Patient has been advised to call insurance company to determine out of pocket expense if they have not yet received this vaccine. Advised may also receive vaccine at local pharmacy or Health Dept. Verbalized acceptance and  understanding.  Screening Tests Health Maintenance  Topic Date Due   COVID-19 Vaccine (2 - Pfizer risk series) 08/30/2021 (Originally 06/28/2021)   Zoster Vaccines- Shingrix (1 of 2) 11/12/2021 (Originally 07/14/1943)   TETANUS/TDAP  05/26/2025   Pneumonia Vaccine 43+ Years old  Completed   INFLUENZA VACCINE  Completed   HPV VACCINES  Aged Out    Health Maintenance  There are no preventive care reminders to display for this patient.   Additional Screening:   Vision Screening: Recommended annual ophthalmology exams for early detection of glaucoma and other disorders of the eye. Is the patient up to date with their annual eye exam?  Yes  Who is the provider or what is the name of the office in which the patient attends annual eye exams? Followed by Dr Yolanda Bonine  Dental Screening: Recommended annual dental exams for proper oral hygiene  Community Resource Referral / Chronic Care Management:  CRR required this visit?  No   CCM required this visit?  No      Plan:     I have personally reviewed and noted the following in the patients chart:   Medical and social history Use of alcohol, tobacco or illicit drugs  Current medications and supplements including opioid prescriptions. Patient is not currently taking opioid prescriptions. Functional ability and status Nutritional status Physical activity Advanced directives List of other physicians Hospitalizations, surgeries, and ER visits in previous 12 months Vitals Screenings to include cognitive, depression, and falls Referrals and appointments  In addition, I have reviewed and discussed with patient certain preventive protocols, quality metrics, and best practice recommendations. A written personalized care plan for preventive services as well as general preventive health recommendations were provided to patient.     [name], LPN   [id number]

## 2021-08-14 NOTE — Patient Instructions (Addendum)
Robert Anderson , Thank you for taking time to come for your Medicare Wellness Visit. I appreciate your ongoing commitment to your health goals. Please review the following plan we discussed and let me know if I can assist you in the future.   These are the goals we discussed:  Goals      Patient Stated     Reduce simple sugar intake to help with triglycerides Keep doing what you're doing.        This is a list of the screening recommended for you and due dates:  Health Maintenance  Topic Date Due   COVID-19 Vaccine (2 - Pfizer risk series) 08/30/2021*   Zoster (Shingles) Vaccine (1 of 2) 11/12/2021*   Tetanus Vaccine  05/26/2025   Pneumonia Vaccine  Completed   Flu Shot  Completed   HPV Vaccine  Aged Out  *Topic was postponed. The date shown is not the original due date.    Advanced directives: Yes  Conditions/risks identified: None  Next appointment: Follow up in one year for your annual wellness visit.   Preventive Care 30 Years and Older, Male Preventive care refers to lifestyle choices and visits with your health care provider that can promote health and wellness. What does preventive care include? A yearly physical exam. This is also called an annual well check. Dental exams once or twice a year. Routine eye exams. Ask your health care provider how often you should have your eyes checked. Personal lifestyle choices, including: Daily care of your teeth and gums. Regular physical activity. Eating a healthy diet. Avoiding tobacco and drug use. Limiting alcohol use. Practicing safe sex. Taking low doses of aspirin every day. Taking vitamin and mineral supplements as recommended by your health care provider. What happens during an annual well check? The services and screenings done by your health care provider during your annual well check will depend on your age, overall health, lifestyle risk factors, and family history of disease. Counseling  Your health care provider  may ask you questions about your: Alcohol use. Tobacco use. Drug use. Emotional well-being. Home and relationship well-being. Sexual activity. Eating habits. History of falls. Memory and ability to understand (cognition). Work and work Statistician. Screening  You may have the following tests or measurements: Height, weight, and BMI. Blood pressure. Lipid and cholesterol levels. These may be checked every 5 years, or more frequently if you are over 72 years old. Skin check. Lung cancer screening. You may have this screening every year starting at age 49 if you have a 30-pack-year history of smoking and currently smoke or have quit within the past 15 years. Fecal occult blood test (FOBT) of the stool. You may have this test every year starting at age 22. Flexible sigmoidoscopy or colonoscopy. You may have a sigmoidoscopy every 5 years or a colonoscopy every 10 years starting at age 22. Prostate cancer screening. Recommendations will vary depending on your family history and other risks. Hepatitis C blood test. Hepatitis B blood test. Sexually transmitted disease (STD) testing. Diabetes screening. This is done by checking your blood sugar (glucose) after you have not eaten for a while (fasting). You may have this done every 1-3 years. Abdominal aortic aneurysm (AAA) screening. You may need this if you are a current or former smoker. Osteoporosis. You may be screened starting at age 51 if you are at high risk. Talk with your health care provider about your test results, treatment options, and if necessary, the need for more tests. Vaccines  Your health care provider may recommend certain vaccines, such as: Influenza vaccine. This is recommended every year. Tetanus, diphtheria, and acellular pertussis (Tdap, Td) vaccine. You may need a Td booster every 10 years. Zoster vaccine. You may need this after age 49. Pneumococcal 13-valent conjugate (PCV13) vaccine. One dose is recommended after  age 12. Pneumococcal polysaccharide (PPSV23) vaccine. One dose is recommended after age 52. Talk to your health care provider about which screenings and vaccines you need and how often you need them. This information is not intended to replace advice given to you by your health care provider. Make sure you discuss any questions you have with your health care provider. Document Released: 08/29/2015 Document Revised: 04/21/2016 Document Reviewed: 06/03/2015 Elsevier Interactive Patient Education  2017 [place] Prevention in the Home Falls can cause injuries. They can happen to people of all ages. There are many things you can do to make your home safe and to help prevent falls. What can I do on the outside of my home? Regularly fix the edges of walkways and driveways and fix any cracks. Remove anything that might make you trip as you walk through a door, such as a raised step or threshold. Trim any bushes or trees on the path to your home. Use bright outdoor lighting. Clear any walking paths of anything that might make someone trip, such as rocks or tools. Regularly check to see if handrails are loose or broken. Make sure that both sides of any steps have handrails. Any raised decks and porches should have guardrails on the edges. Have any leaves, snow, or ice cleared regularly. Use sand or salt on walking paths during winter. Clean up any spills in your garage right away. This includes oil or grease spills. What can I do in the bathroom? Use night lights. Install grab bars by the toilet and in the tub and shower. Do not use towel bars as grab bars. Use non-skid mats or decals in the tub or shower. If you need to sit down in the shower, use a plastic, non-slip stool. Keep the floor dry. Clean up any water that spills on the floor as soon as it happens. Remove soap buildup in the tub or shower regularly. Attach bath mats securely with double-sided non-slip rug tape. Do not have  throw rugs and other things on the floor that can make you trip. What can I do in the bedroom? Use night lights. Make sure that you have a light by your bed that is easy to reach. Do not use any sheets or blankets that are too big for your bed. They should not hang down onto the floor. Have a firm chair that has side arms. You can use this for support while you get dressed. Do not have throw rugs and other things on the floor that can make you trip. What can I do in the kitchen? Clean up any spills right away. Avoid walking on wet floors. Keep items that you use a lot in easy-to-reach places. If you need to reach something above you, use a strong step stool that has a grab bar. Keep electrical cords out of the way. Do not use floor polish or wax that makes floors slippery. If you must use wax, use non-skid floor wax. Do not have throw rugs and other things on the floor that can make you trip. What can I do with my stairs? Do not leave any items on the stairs. Make sure that there are  handrails on both sides of the stairs and use them. Fix handrails that are broken or loose. Make sure that handrails are as long as the stairways. Check any carpeting to make sure that it is firmly attached to the stairs. Fix any carpet that is loose or worn. Avoid having throw rugs at the top or bottom of the stairs. If you do have throw rugs, attach them to the floor with carpet tape. Make sure that you have a light switch at the top of the stairs and the bottom of the stairs. If you do not have them, ask someone to add them for you. What else can I do to help prevent falls? Wear shoes that: Do not have high heels. Have rubber bottoms. Are comfortable and fit you well. Are closed at the toe. Do not wear sandals. If you use a stepladder: Make sure that it is fully opened. Do not climb a closed stepladder. Make sure that both sides of the stepladder are locked into place. Ask someone to hold it for you, if  possible. Clearly mark and make sure that you can see: Any grab bars or handrails. First and last steps. Where the edge of each step is. Use tools that help you move around (mobility aids) if they are needed. These include: Canes. Walkers. Scooters. Crutches. Turn on the lights when you go into a dark area. Replace any light bulbs as soon as they burn out. Set up your furniture so you have a clear path. Avoid moving your furniture around. If any of your floors are uneven, fix them. If there are any pets around you, be aware of where they are. Review your medicines with your doctor. Some medicines can make you feel dizzy. This can increase your chance of falling. Ask your doctor what other things that you can do to help prevent falls. This information is not intended to replace advice given to you by your health care provider. Make sure you discuss any questions you have with your health care provider. Document Released: 05/29/2009 Document Revised: 01/08/2016 Document Reviewed: 09/06/2014 Elsevier Interactive Patient Education  2017 [employer].

## 2021-08-23 NOTE — Progress Notes (Signed)
Cardiology Office Note   Date:  09/01/2021   ID:  Robert Anderson, DOB 1923/09/15, MRN [id number]  PCP:  Shirline Frees, NP  Cardiologist:   Yisel Megill [place], MD   Chief Complaint  Patient presents with   Atrial Fibrillation      History of Present Illness: Robert Anderson is a 88 y.o. male who presents for follow up heart block and prior TAVR. Previously followed by Dr Delton See. 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 He has recurrent bladder CA and is s/p chemotherapy and resection. Echo in January this year showed marked basal septal hypertrophy without outflow gradient. Normal EF. Normal functioning AV prosthesis. Pacemaker check 12/23/20 was normal. Followed by Dr Ladona Ridgel.   He is living at [facility]. Retired from [employer]. Originally from [place]. Has 4 children by his first marriage. Married again 38 years ago. Generally feels very well. Rides his stationary bike 10 minutes in the morning and 5 in the evening. Does 130 crunches. Walking is limited. Uses a walker. Denies any chest pain, dyspnea, palpitations or dizziness. Feels like he is doing well. He did have some basal cell cancer treated on his legs with Dr Yetta Barre.  Past Medical History:  Diagnosis Date   Atrial fibrillation [facility])    a. dx after lead revision 06/26/15 >> anticoag not started due to recent pericardial effusion in setting of perforation and lead revision   Bladder cancer (HCC)    chemo done 2021   Bradycardia    a. HR 30s in clinic 06/2015 - BB discontinued.   DEGENERATIVE JOINT DISEASE, SPINE    ERECTILE DYSFUNCTION    Essential hypertension    First degree AV block    Hearing deficit    wears hearing aides both ears   HEARING LOSS    Heart block    s/p Pacemaker   Hyperlipidemia     Hypertrophic obstructive cardiomyopathy (HCC)    LBBB (left bundle branch block)    Macular degeneration    left eye gets shots for   OA (osteoarthritis)    both hips   Pericardial effusion    a. micorpeforation s/p lead revision 06/26/15   Presence of permanent cardiac pacemaker 06/24/2015   St Jude Assurity   Prostate cancer [facility]) 2010   "external radiation; 40 treatments"   S/P TAVR (transcatheter aortic valve replacement) 03/25/2015   29 mm Edwards Sapien 3 transcatheter heart valve placed via open right transfemoral approach   Severe aortic stenosis    a. s/p TAVR 03/2015. Pre-op  LHC 02/2015: minor nonobstructive CAD.   Squamous cell skin cancer    left lower leg   Stroke (HCC) 02/19/2014   Small infarct high posterior left frontal lobe on MRI     TIA (transient ischemic attack) 01/21/2014   5 min spell aphasia without assoc sx     Uses walker    Wears glasses     Past Surgical History:  Procedure Laterality Date   ANTERIOR CERVICAL DECOMP/DISCECTOMY FUSION  ~ 2000   Dr. Otelia Sergeant with bone graft   CARDIAC CATHETERIZATION N/A 02/20/2015   Procedure: Right/Left Heart Cath and Coronary Angiography;  Surgeon: Tonny Bollman, MD;  Location: [facility] INVASIVE CV LAB;  Service: Cardiovascular;  Laterality: N/A;   CARDIAC VALVE REPLACEMENT  CATARACT EXTRACTION W/ INTRAOCULAR LENS  IMPLANT, BILATERAL  2004; 2010   right; left   CYSTOSCOPY W/ RETROGRADES Bilateral 04/22/2020   Procedure: CYSTOSCOPY WITH RETROGRADE PYELOGRAM/FULGERATION BLADDER BIOPSY;  Surgeon: Belva Agee, MD;  [facility]: [facility] OR[facility];  Service: Urology;  Laterality: Bilateral;   CYSTOSCOPY W/ RETROGRADES Bilateral 09/09/2020   Procedure: CYSTOSCOPY WITH RETROGRADE PYELOGRAM;  Surgeon: Belva Agee, MD;  Location[facility];  Service: Urology;  Laterality: Bilateral;   EP IMPLANTABLE DEVICE N/A 06/24/2015   Procedure: Pacemaker Implant;  Surgeon: Marinus Maw, MD;  Location: [facility] INVASIVE CV LAB;  Service:  Cardiovascular; St Jude Assurity    EP IMPLANTABLE DEVICE N/A 06/26/2015   Procedure: Lead Revision/Repair;  Surgeon: Will Jorja Loa, MD;  Location: [facility] INVASIVE CV LAB;  Service: Cardiovascular;  Laterality: N/A;   INSERT / REPLACE / REMOVE PACEMAKER  06/24/2015   JOINT REPLACEMENT     SQUAMOUS CELL CARCINOMA EXCISION Left X 3   "lower leg"   TEE WITHOUT CARDIOVERSION N/A 03/07/2015   Procedure: TRANSESOPHAGEAL ECHOCARDIOGRAM (TEE);  Surgeon: Lars Masson, MD;  Location: [facility] ENDOSCOPY;  Service: Cardiovascular;  Laterality: N/A;   TEE WITHOUT CARDIOVERSION N/A 03/25/2015   Procedure: TRANSESOPHAGEAL ECHOCARDIOGRAM (TEE);  Surgeon: Kathleene Hazel, MD;  Location: [facility] OR;  Service: Open Heart Surgery;  Laterality: N/A;   TONSILLECTOMY  as child   TOTAL SHOULDER ARTHROPLASTY Right 2007   "wore it out playing tennis"   TRANSCATHETER AORTIC VALVE REPLACEMENT, TRANSFEMORAL N/A 03/25/2015   Procedure: TRANSCATHETER AORTIC VALVE REPLACEMENT, TRANSFEMORAL;  Surgeon: Kathleene Hazel, MD;  Location: [facility] OR;  Service: Open Heart Surgery;  Laterality: N/A;   TRANSURETHRAL RESECTION OF BLADDER TUMOR N/A 09/09/2020   Procedure: TRANSURETHRAL RESECTION OF BLADDER TUMOR (TURBT);  Surgeon: Belva Agee, MD;  Location: [facility];  Service: Urology;  Laterality: N/A;  30 MINS     Current Outpatient Medications  Medication Sig Dispense Refill   acetaminophen (TYLENOL) 500 MG tablet Take 500 mg by mouth every 6 (six) hours as needed.     amoxicillin (AMOXIL) 500 MG capsule Take 2,000 mg by mouth See admin instructions. Take 2000 mg 1 hour prior to dental work     docusate sodium (COLACE) 100 MG capsule Take 300 mg by mouth daily with lunch.      metoprolol succinate (TOPROL-XL) 25 MG 24 hr tablet TAKE 1 TABLET BY MOUTH EVERY DAY 90 tablet 3   mirabegron ER (MYRBETRIQ) 50 MG TB24 tablet Take 50 mg by mouth every evening.      Multiple Vitamins-Minerals (PRESERVISION AREDS  2+MULTI VIT PO) Take 1 tablet by mouth 2 (two) times daily.     Omega-3 Fatty Acids (FISH OIL) 1000 MG CAPS Take 1,000 mg by mouth 2 (two) times daily.     PREVIDENT 5000 SENSITIVE 1.1-5 % PSTE Apply 1 application topically at bedtime.      rosuvastatin (CRESTOR) 5 MG tablet Take 1 tablet (5 mg total) by mouth daily. 90 tablet 1   tamsulosin (FLOMAX) 0.4 MG CAPS capsule Take 0.4 mg by mouth daily after supper.      warfarin (COUMADIN) 3 MG tablet TAKE 1/2 TAB ON WEDNESDAY AND SATURDAY, 1 TAB ON ALL OTHER DAYS OR AS DIRECTED BY COUMADIN CLINIC 45 tablet 0   No current facility-administered medications for this visit.    Allergies:   Patient has no known allergies.    Social History:  The patient  reports that he has never  smoked. He has never used smokeless tobacco. He reports that he does not drink alcohol and does not use drugs.   Family History:  The patient's family history includes Brain cancer in his sister; CVA in his father and mother; Cancer in his father; Heart attack in his brother; Heart disease in his brother and mother; Lung cancer in his brother; Prostate cancer in his brother; Stroke in his mother.    ROS:  Please see the history of present illness.   Otherwise, review of systems are positive for none.   All other systems are reviewed and negative.    PHYSICAL EXAM: VS:  BP (!) 102/54    Pulse 60    Ht 5\' 8"  (1.727 m)    Wt 231 lb (104.8 kg)    SpO2 97%    BMI 35.12 kg/m  , BMI Body mass index is 35.12 kg/m. GEN: Well nourished, well developed, in no acute distress HEENT: normal Neck: no JVD, carotid bruits, or masses Cardiac: RRR; no murmurs, rubs, or gallops,no edema  Respiratory:  clear to auscultation bilaterally, normal work of breathing GI: soft, nontender, nondistended, + BS MS: no deformity or atrophy Skin: warm and dry, no rash Neuro:  Strength and sensation are intact Psych: euthymic mood, full affect   EKG:  EKG is ordered today. The ekg ordered today  demonstrates AV paced rhythm. Rate 60. I have personally reviewed and interpreted this study.    Recent Labs: 11/04/2020: ALT 14; BUN 18; Creatinine, Ser 0.92; Hemoglobin 14.9; Platelets 176.0; Potassium 4.5; Sodium 141; TSH 3.61    Lipid Panel    Component Value Date/Time   CHOL 139 11/04/2020 1101   TRIG 204.0 (H) 11/04/2020 1101   HDL 42.30 11/04/2020 1101   CHOLHDL 3 11/04/2020 1101   VLDL 40.8 (H) 11/04/2020 1101   LDLCALC 136 (H) 07/29/2016 0855   LDLDIRECT 69.0 11/04/2020 1101      Wt Readings from Last 3 Encounters:  09/01/21 231 lb (104.8 kg)  08/14/21 233 lb (105.7 kg)  02/09/21 233 lb (105.7 kg)      Other studies Reviewed: Additional studies/ records that were reviewed today include:   Echo 08/26/20: IMPRESSIONS     1. Severe asymmetric septal hypertrophy up to 22 mm consistent with  history of hypertrophic cardiomyopathy. No LVOT obstruction or SAM of the  mitral valve. Left ventricular ejection fraction, by estimation, is 65 to  70%. The left ventricle has normal  function. The left ventricle has no regional wall motion abnormalities.  There is severe asymmetric left ventricular hypertrophy of the septal  segment. Left ventricular diastolic parameters are consistent with Grade  III diastolic dysfunction  (restrictive).   2. Right ventricular systolic function is normal. The right ventricular  size is normal. Tricuspid regurgitation signal is inadequate for assessing  PA pressure.   3. Left atrial size was mildly dilated.   4. The mitral valve is grossly normal. Mild mitral valve regurgitation.  No evidence of mitral stenosis.   5. 29 mm S3 in aortic position. Vmax 2.0 m/s, MG 8.0 mmHG, EOA 3.22 cm2,  DI 0.71. No paravalvular leak. The aortic valve has been  repaired/replaced. Aortic valve regurgitation is not visualized. Procedure  Date: 03/25/2015. Echo findings are consistent  with normal structure and function of the aortic valve prosthesis.   6. The  inferior vena cava is normal in size with greater than 50%  respiratory variability, suggesting right atrial pressure of 3 mmHg.   Comparison(s): No significant  change from prior study.    ASSESSMENT AND PLAN:  1.  Status post TAVR 2016 last echo Jan 2022 functioning normally. He is asymptomatic. Aware of SBE prophylaxis.    2. Paroxysmal atrial fibrillation on Coumadin and Toprol, no recent episodes of palpitations, he is compliant with his warfarin.    3. Hypertrophic nonobstructive cardiomyopathy    4. Essential hypertension controlled on current management   5. Permanent pacemaker followed by Dr. Ladona Ridgel - placed for high degree AV block following TAVR   6. Bladder cancer -status post chemo and resection. Anticipates one more cystoscopy    7. Hyperlipidemia, on low-dose of rosuvastatin 5 mg daily. LDL at goal 69. Labs followed by PCP   Current medicines are reviewed at length with the patient today.  The patient does not have concerns regarding medicines.  The following changes have been made:  no change  Labs/ tests ordered today include:  No orders of the defined types were placed in this encounter.     Disposition:   FU with me in 6 months  Signed, Salem Lembke [place], MD  09/01/2021 2:50 PM    [facility] [address], [place], [place], [address] Phone [phone], Fax [phone]

## 2021-09-01 ENCOUNTER — Other Ambulatory Visit: Payer: Self-pay

## 2021-09-01 ENCOUNTER — Ambulatory Visit: Payer: Medicare Other | Admitting: Cardiology

## 2021-09-01 ENCOUNTER — Encounter: Payer: Self-pay | Admitting: Cardiology

## 2021-09-01 VITALS — BP 102/54 | HR 60 | Ht 68.0 in | Wt 231.0 lb

## 2021-09-01 DIAGNOSIS — I447 Left bundle-branch block, unspecified: Secondary | ICD-10-CM

## 2021-09-01 DIAGNOSIS — I442 Atrioventricular block, complete: Secondary | ICD-10-CM | POA: Diagnosis not present

## 2021-09-01 DIAGNOSIS — Z952 Presence of prosthetic heart valve: Secondary | ICD-10-CM

## 2021-09-01 DIAGNOSIS — I4891 Unspecified atrial fibrillation: Secondary | ICD-10-CM

## 2021-09-01 DIAGNOSIS — I10 Essential (primary) hypertension: Secondary | ICD-10-CM

## 2021-09-01 DIAGNOSIS — Z950 Presence of cardiac pacemaker: Secondary | ICD-10-CM

## 2021-09-04 ENCOUNTER — Other Ambulatory Visit: Payer: Self-pay | Admitting: Cardiology

## 2021-09-07 ENCOUNTER — Ambulatory Visit (INDEPENDENT_AMBULATORY_CARE_PROVIDER_SITE_OTHER): Payer: Medicare Other | Admitting: Family Medicine

## 2021-09-07 VITALS — BP 120/58 | HR 59 | Temp 98.2°F | Wt 231.3 lb

## 2021-09-07 DIAGNOSIS — I959 Hypotension, unspecified: Secondary | ICD-10-CM | POA: Diagnosis not present

## 2021-09-07 LAB — CBC WITH DIFFERENTIAL/PLATELET
Basophils Absolute: 0 10*3/uL (ref 0.0–0.1)
Basophils Relative: 0.4 % (ref 0.0–3.0)
Eosinophils Absolute: 0.1 10*3/uL (ref 0.0–0.7)
Eosinophils Relative: 1.2 % (ref 0.0–5.0)
HCT: 45.7 % (ref 39.0–52.0)
Hemoglobin: 14.7 g/dL (ref 13.0–17.0)
Lymphocytes Relative: 20.4 % (ref 12.0–46.0)
Lymphs Abs: 1.2 10*3/uL (ref 0.7–4.0)
MCHC: 32 g/dL (ref 30.0–36.0)
MCV: 89.8 fl (ref 78.0–100.0)
Monocytes Absolute: 0.4 10*3/uL (ref 0.1–1.0)
Monocytes Relative: 6.8 % (ref 3.0–12.0)
Neutro Abs: 4.3 10*3/uL (ref 1.4–7.7)
Neutrophils Relative %: 71.2 % (ref 43.0–77.0)
Platelets: 169 10*3/uL (ref 150.0–400.0)
RBC: 5.1 Mil/uL (ref 4.22–5.81)
RDW: 13.8 % (ref 11.5–15.5)
WBC: 6 10*3/uL (ref 4.0–10.5)

## 2021-09-07 LAB — BASIC METABOLIC PANEL
BUN: 17 mg/dL (ref 6–23)
CO2: 27 mEq/L (ref 19–32)
Calcium: 9.3 mg/dL (ref 8.4–10.5)
Chloride: 105 mEq/L (ref 96–112)
Creatinine, Ser: 0.85 mg/dL (ref 0.40–1.50)
GFR: 74.05 mL/min (ref >60.00)
Glucose, Bld: 131 mg/dL — ABNORMAL HIGH (ref 70–99)
Potassium: 4.2 mEq/L (ref 3.5–5.1)
Sodium: 139 mEq/L (ref 135–145)

## 2021-09-07 NOTE — Progress Notes (Signed)
Established Patient Office Visit  Subjective:  Patient ID: Robert Anderson, male    DOB: [birth year]  Age: 88 y.o. MRN: [id number]  CC:  Chief Complaint  Patient presents with   Dizziness    X 1 days, has occasional dizzy spells, not constant, patient is afraid he is going to loose his balance due to this    HPI Robert Anderson presents as acute work and with 1 day history of increased dizziness.  This does not occur seated but is more with standing and ambulation.  Denies any recent nausea, vomiting, or diarrhea.  He states he has been drinking fairly well.  He has fairly complex cardiovascular history with history of TAVR 2016.  History of paroxysmal atrial fibrillation on Coumadin and Toprol.  He has history of hypertrophic nonobstructive cardiomyopathy and hypertension.  He has history of pacemaker secondary to high degree AV block.  Previous echo last January showed severe asymmetric septal hypertrophy consistent with history of hypertrophic cardiomyopathy.  Ejection fraction 65 to 70%.  Patient relates more of a lightheadedness.  No vertigo.  No syncope.  Only blood pressure medicine is Toprol-XL 25 mg daily.  He does take Coumadin along with Crestor and Flomax.  He resides at friend's home with his wife who is with him today.  No recent headaches.  No chest pains.  No dyspnea.  Otherwise feels well.  No recent dietary changes.  Does not restrict sodium.  Past Medical History:  Diagnosis Date   Atrial fibrillation [facility])    a. dx after lead revision 06/26/15 >> anticoag not started due to recent pericardial effusion in setting of perforation and lead revision   Bladder cancer ([place])    chemo done 2021   Bradycardia    a. HR 30s in clinic 06/2015 - BB discontinued.   DEGENERATIVE JOINT DISEASE, SPINE    ERECTILE DYSFUNCTION    Essential hypertension    First degree AV block    Hearing deficit    wears hearing aides both ears   HEARING LOSS    Heart block    s/p Pacemaker   Hyperlipidemia     Hypertrophic obstructive cardiomyopathy (HCC)    LBBB (left bundle branch block)    Macular degeneration    left eye gets shots for   OA (osteoarthritis)    both hips   Pericardial effusion    a. micorpeforation s/p lead revision 06/26/15   Presence of permanent cardiac pacemaker 06/24/2015   St Jude Assurity   Prostate cancer [facility]) 2010   "external radiation; 40 treatments"   S/P TAVR (transcatheter aortic valve replacement) 03/25/2015   29 mm Edwards Sapien 3 transcatheter heart valve placed via open right transfemoral approach   Severe aortic stenosis    a. s/p TAVR 03/2015. Pre-op  LHC 02/2015: minor nonobstructive CAD.   Squamous cell skin cancer    left lower leg   Stroke ([place]) 02/19/2014   Small infarct high posterior left frontal lobe on MRI     TIA (transient ischemic attack) 01/21/2014   5 min spell aphasia without assoc sx     Uses walker    Wears glasses     Past Surgical History:  Procedure Laterality Date   ANTERIOR CERVICAL DECOMP/DISCECTOMY FUSION  ~ 2000   Dr. Louanne Skye with bone graft   CARDIAC CATHETERIZATION N/A 02/20/2015   Procedure: Right/Left Heart Cath and Coronary Angiography;  Surgeon: Sherren Mocha, MD;  Location: [place] CV LAB;  Service: Cardiovascular;  Laterality:  N/A;   CARDIAC VALVE REPLACEMENT     CATARACT EXTRACTION W/ INTRAOCULAR LENS  IMPLANT, BILATERAL  2004; 2010   right; left   CYSTOSCOPY W/ RETROGRADES Bilateral 04/22/2020   Procedure: CYSTOSCOPY WITH RETROGRADE PYELOGRAM/FULGERATION BLADDER BIOPSY;  Surgeon: Remi Haggard, MD;  Location[facility];  Service: Urology;  Laterality: Bilateral;   CYSTOSCOPY W/ RETROGRADES Bilateral 09/09/2020   Procedure: CYSTOSCOPY WITH RETROGRADE PYELOGRAM;  Surgeon: Remi Haggard, MD;  Location: [facility];  Service: Urology;  Laterality: Bilateral;   EP IMPLANTABLE DEVICE N/A 06/24/2015   Procedure: Pacemaker Implant;  Surgeon: Evans Lance, MD;  Location: [place] CV LAB;  Service:  Cardiovascular; St Jude Assurity    EP IMPLANTABLE DEVICE N/A 06/26/2015   Procedure: Lead Revision/Repair;  Surgeon: Will Meredith Leeds, MD;  Location: [place] CV LAB;  Service: Cardiovascular;  Laterality: N/A;   INSERT / REPLACE / REMOVE PACEMAKER  06/24/2015   JOINT REPLACEMENT     SQUAMOUS CELL CARCINOMA EXCISION Left X 3   "lower leg"   TEE WITHOUT CARDIOVERSION N/A 03/07/2015   Procedure: TRANSESOPHAGEAL ECHOCARDIOGRAM (TEE);  Surgeon: Dorothy Spark, MD;  Location: [place];  Service: Cardiovascular;  Laterality: N/A;   TEE WITHOUT CARDIOVERSION N/A 03/25/2015   Procedure: TRANSESOPHAGEAL ECHOCARDIOGRAM (TEE);  Surgeon: Burnell Blanks, MD;  Location: [place];  Service: Open Heart Surgery;  Laterality: N/A;   TONSILLECTOMY  as child   TOTAL SHOULDER ARTHROPLASTY Right 2007   "wore it out playing tennis"   TRANSCATHETER AORTIC VALVE REPLACEMENT, TRANSFEMORAL N/A 03/25/2015   Procedure: TRANSCATHETER AORTIC VALVE REPLACEMENT, TRANSFEMORAL;  Surgeon: Burnell Blanks, MD;  Location: [place];  Service: Open Heart Surgery;  Laterality: N/A;   TRANSURETHRAL RESECTION OF BLADDER TUMOR N/A 09/09/2020   Procedure: TRANSURETHRAL RESECTION OF BLADDER TUMOR (TURBT);  Surgeon: Remi Haggard, MD;  Location: [facility];  Service: Urology;  Laterality: N/A;  2 MINS    Family History  Problem Relation Age of Onset   Heart disease Mother    CVA Mother    Stroke Mother    CVA Father    Cancer Father    Brain cancer Sister    Prostate cancer Brother    Heart disease Brother    Lung cancer Brother    Heart attack Brother    Hypertension Neg Hx     Social History   Socioeconomic History   Marital status: Married    Spouse name: Not on file   Number of children: 4   Years of education: 16   Highest education level: Not on file  Occupational History   Occupation: Retired  Tobacco Use   Smoking status: Never   Smokeless tobacco: Never  Vaping Use    Vaping Use: Never used  Substance and Sexual Activity   Alcohol use: No    Alcohol/week: 0.0 standard drinks   Drug use: No   Sexual activity: Not Currently  Other Topics Concern   Not on file  Social History Narrative   Patient lives in [place] with his wife. 2nd marriage    4 children    He goes to the [facility] three days a week      09/27/18: Lives at [facility] with wife   Still goes to [facility], 2X/week, bicycles/uses sitting eliptical in room daily   Enjoys singing, part of singing group at center   Active in discussion groups at center         Social  Determinants of Health   Financial Resource Strain: High Risk   Difficulty of Paying Living Expenses: Very hard  Food Insecurity: No Food Insecurity   Worried About Running Out of Food in the Last Year: Never true   Ran Out of Food in the Last Year: Never true  Transportation Needs: No Transportation Needs   Lack of Transportation (Medical): No   Lack of Transportation (Non-Medical): No  Physical Activity: Insufficiently Active   Days of Exercise per Week: 7 days   Minutes of Exercise per Session: 10 min  Stress: No Stress Concern Present   Feeling of Stress : Not at all  Social Connections: Socially Integrated   Frequency of Communication with Friends and Family: More than three times a week   Frequency of Social Gatherings with Friends and Family: More than three times a week   Attends Religious Services: More than 4 times per year   Active Member of [employer] or Organizations: Yes   Attends Music therapist: More than 4 times per year   Marital Status: Married  Human resources officer Violence: Not At Risk   Fear of Current or Ex-Partner: No   Emotionally Abused: No   Physically Abused: No   Sexually Abused: No    Outpatient Medications Prior to Visit  Medication Sig Dispense Refill   acetaminophen (TYLENOL) 500 MG tablet Take 500 mg by mouth every 6 (six) hours as needed.     amoxicillin (AMOXIL)  500 MG capsule Take 2,000 mg by mouth See admin instructions. Take 2000 mg 1 hour prior to dental work     docusate sodium (COLACE) 100 MG capsule Take 300 mg by mouth daily with lunch.      metoprolol succinate (TOPROL-XL) 25 MG 24 hr tablet TAKE 1 TABLET BY MOUTH EVERY DAY 90 tablet 3   mirabegron ER (MYRBETRIQ) 50 MG TB24 tablet Take 50 mg by mouth every evening.      Multiple Vitamins-Minerals (PRESERVISION AREDS 2+MULTI VIT PO) Take 1 tablet by mouth 2 (two) times daily.     Omega-3 Fatty Acids (FISH OIL) 1000 MG CAPS Take 1,000 mg by mouth 2 (two) times daily.     PREVIDENT 5000 SENSITIVE 1.1-5 % PSTE Apply 1 application topically at bedtime.      rosuvastatin (CRESTOR) 5 MG tablet Take 1 tablet (5 mg total) by mouth daily. 90 tablet 1   tamsulosin (FLOMAX) 0.4 MG CAPS capsule Take 0.4 mg by mouth daily after supper.      warfarin (COUMADIN) 3 MG tablet TAKE 1/2 TAB ON WEDNESDAY AND SATURDAY, 1 TAB ON ALL OTHER DAYS OR AS DIRECTED BY COUMADIN CLINIC 45 tablet 0   No facility-administered medications prior to visit.    No Known Allergies  ROS Review of Systems  Constitutional:  Negative for fatigue and fever.  Eyes:  Negative for visual disturbance.  Respiratory:  Negative for cough, chest tightness and shortness of breath.   Cardiovascular:  Negative for chest pain, palpitations and leg swelling.  Genitourinary:  Negative for dysuria.  Neurological:  Positive for dizziness and light-headedness. Negative for syncope, weakness and headaches.  Psychiatric/Behavioral:  Negative for confusion.      Objective:    Physical Exam Vitals reviewed.  Cardiovascular:     Rate and Rhythm: Normal rate.  Pulmonary:     Effort: Pulmonary effort is normal.     Breath sounds: Normal breath sounds.  Musculoskeletal:     Comments: Only trace pitting edema lower legs bilaterally which apparently  is his baseline.  Neurological:     General: No focal deficit present.     Mental Status: He is  oriented to person, place, and time.     Cranial Nerves: No cranial nerve deficit.    BP (!) 120/58 (BP Location: Left Arm, Patient Position: Sitting, Cuff Size: Normal)    Pulse (!) 59    Temp 98.2 F (36.8 C) (Oral)    Wt 231 lb 4.8 oz (104.9 kg)    SpO2 (!) 87%    BMI 35.17 kg/m  Wt Readings from Last 3 Encounters:  09/07/21 231 lb 4.8 oz (104.9 kg)  09/01/21 231 lb (104.8 kg)  08/14/21 233 lb (105.7 kg)     Health Maintenance Due  Topic Date Due   COVID-19 Vaccine (2 - Pfizer risk series) 06/28/2021    There are no preventive care reminders to display for this patient.  Lab Results  Component Value Date   TSH 3.61 11/04/2020   Lab Results  Component Value Date   WBC 6.5 11/04/2020   HGB 14.9 11/04/2020   HCT 45.2 11/04/2020   MCV 88.6 11/04/2020   PLT 176.0 11/04/2020   Lab Results  Component Value Date   NA 141 11/04/2020   K 4.5 11/04/2020   CO2 28 11/04/2020   GLUCOSE 109 (H) 11/04/2020   BUN 18 11/04/2020   CREATININE 0.92 11/04/2020   BILITOT 0.7 11/04/2020   ALKPHOS 69 11/04/2020   AST 16 11/04/2020   ALT 14 11/04/2020   PROT 6.1 11/04/2020   ALBUMIN 4.0 11/04/2020   CALCIUM 9.6 11/04/2020   ANIONGAP 10 04/17/2020   GFR 71.30 11/04/2020   Lab Results  Component Value Date   CHOL 139 11/04/2020   Lab Results  Component Value Date   HDL 42.30 11/04/2020   Lab Results  Component Value Date   LDLCALC 136 (H) 07/29/2016   Lab Results  Component Value Date   TRIG 204.0 (H) 11/04/2020   Lab Results  Component Value Date   CHOLHDL 3 11/04/2020   Lab Results  Component Value Date   HGBA1C 6.1 (H) 03/21/2015      Assessment & Plan:   Problem List Items Addressed This Visit   None Visit Diagnoses     Hypotension, unspecified hypotension type    -  Primary   Relevant Orders   CBC with Differential/Platelet   Basic metabolic panel     Patient presents with 1 day history of dizziness.  Appears to be hydrated fairly well but has  clear orthostatic changes.  Blood pressure left arm seated after rest 105/60 and standing 84/50.  Asymptomatic seated.  Ambulating with walker with no difficulty.  -Patient was able to drink 300 cc here in office with no difficulty. -Check labs with basic metabolic panel and CBC -Strongly encouraged to increase home fluid intake and if symptoms not resolving in a couple days may need further intervention.  Would consult with cardiology if other interventions such as midodrine considered.  Doubt clinically patient is anemic but will check labs above  No orders of the defined types were placed in this encounter.   Follow-up: No follow-ups on file.    Carolann Littler, MD

## 2021-09-07 NOTE — Patient Instructions (Signed)
Please let me know if dizziness not improving over next few days with increased fluids and liberalizing salt somewhat.

## 2021-09-14 ENCOUNTER — Other Ambulatory Visit: Payer: Self-pay

## 2021-09-14 ENCOUNTER — Ambulatory Visit (INDEPENDENT_AMBULATORY_CARE_PROVIDER_SITE_OTHER): Payer: Medicare Other

## 2021-09-14 DIAGNOSIS — Z952 Presence of prosthetic heart valve: Secondary | ICD-10-CM

## 2021-09-14 DIAGNOSIS — Z5181 Encounter for therapeutic drug level monitoring: Secondary | ICD-10-CM | POA: Diagnosis not present

## 2021-09-14 DIAGNOSIS — Z79899 Other long term (current) drug therapy: Secondary | ICD-10-CM | POA: Diagnosis not present

## 2021-09-14 LAB — POCT INR: INR: 2.8 (ref 2.0–3.0)

## 2021-09-14 NOTE — Patient Instructions (Signed)
-   continue 1 tablet daily except 1/2 tablet on Wednesday and Saturday  - Repeat INR 6 weeks

## 2021-09-17 ENCOUNTER — Other Ambulatory Visit: Payer: Self-pay | Admitting: Cardiology

## 2021-09-22 LAB — CUP PACEART REMOTE DEVICE CHECK
Battery Remaining Longevity: 35 mo
Battery Remaining Percentage: 33 %
Battery Voltage: 2.93 V
Brady Statistic AP VP Percent: 68 %
Brady Statistic AP VS Percent: 1 %
Brady Statistic AS VP Percent: 32 %
Brady Statistic AS VS Percent: 1 %
Brady Statistic RA Percent Paced: 66 %
Brady Statistic RV Percent Paced: 99 %
Date Time Interrogation Session: 20230207020013
Implantable Lead Implant Date: 20161108
Implantable Lead Implant Date: 20161108
Implantable Lead Location: 753859
Implantable Lead Location: 753860
Implantable Pulse Generator Implant Date: 20161108
Lead Channel Impedance Value: 410 Ohm
Lead Channel Impedance Value: 440 Ohm
Lead Channel Pacing Threshold Amplitude: 0.75 V
Lead Channel Pacing Threshold Amplitude: 0.875 V
Lead Channel Pacing Threshold Pulse Width: 0.5 ms
Lead Channel Pacing Threshold Pulse Width: 0.5 ms
Lead Channel Sensing Intrinsic Amplitude: 5 mV
Lead Channel Sensing Intrinsic Amplitude: 8.7 mV
Lead Channel Setting Pacing Amplitude: 1.125
Lead Channel Setting Pacing Amplitude: 2 V
Lead Channel Setting Pacing Pulse Width: 0.5 ms
Lead Channel Setting Sensing Sensitivity: 5 mV
Pulse Gen Model: 2240
Pulse Gen Serial Number: 7825693

## 2021-09-23 ENCOUNTER — Telehealth: Payer: Self-pay | Admitting: Cardiology

## 2021-09-23 ENCOUNTER — Ambulatory Visit (INDEPENDENT_AMBULATORY_CARE_PROVIDER_SITE_OTHER): Payer: Medicare Other

## 2021-09-23 DIAGNOSIS — I442 Atrioventricular block, complete: Secondary | ICD-10-CM | POA: Diagnosis not present

## 2021-09-23 NOTE — Telephone Encounter (Signed)
Will route to pharm for input on anticoag. Last OV 08/2021.

## 2021-09-23 NOTE — Telephone Encounter (Signed)
° ° °  Pre-operative Risk Assessment    Patient Name: Robert Anderson  DOB: 01/03/1924 MRN: [id number]      Request for Surgical Clearance    Procedure:   TURBT  Date of Surgery:  Clearance TBD                                 Surgeon:  Dr. Harriet Masson Group or Practice Name:  [facility] Phone number:  [phone] Fax number:  [phone]   Type of Clearance Requested:   - Medical  - Pharmacy:  Hold Warfarin (Coumadin) 5 days prior procedure   Type of Anesthesia:  General    Additional requests/questions:    Signed, Selinda Orion   09/23/2021, 3:46 PM

## 2021-09-25 NOTE — Telephone Encounter (Signed)
Patient with diagnosis of afib on warfarin for anticoagulation.    Procedure: TURBT Date of procedure: TBD  CHA2DS2-VASc Score = 6   This indicates a 9.7% annual risk of stroke. The patient's score is based upon: CHF History: 0 HTN History: 1 Diabetes History: 0 Stroke History: 2 Vascular Disease History: 1 Age Score: 2 Gender Score: 0      CrCl 61 ml/min Platelet count 169  Per office protocol, patient can hold warfarin for 5 days prior to procedure.    Patient meets criteria for bridging due to hx of CVA and TIA. Due to age, I will confirm with Dr. Martinique that he would like a lovenox bridge. He was bridged in the past per Dr. Meda Coffee.

## 2021-09-25 NOTE — Telephone Encounter (Signed)
Patient will need lovenox bridge. He should let the [facility] coumadin clinic know when procedure is scheduled so they can coordinate bridge.

## 2021-09-28 ENCOUNTER — Telehealth: Payer: Self-pay

## 2021-09-28 NOTE — Telephone Encounter (Signed)
Called and spoke to pt's wife and told her to inform us of surgery date as soon as it is scheduled.  She verbalized understanding

## 2021-09-28 NOTE — Telephone Encounter (Addendum)
° °  Name: Robert Anderson  DOB: 10-04-1923  MRN: [id number]   Primary Cardiologist: Peter Martinique, MD  Chart reviewed as part of pre-operative protocol coverage. Patient was contacted 09/28/2021 in reference to pre-operative risk assessment for pending surgery as outlined below.  Robert Anderson was last seen 09/01/21 by Dr. Martinique, history reviewed, felt to be doing well at that visit. RCRI calculated at 0.9% indicating low CV risk. Age seems to be patient's primary risk factor at this juncture. Reached out to pt to ensure no new sx. Got Vm. LMTCB.  Addendum: Pt & wife returned call. Wife assisted with call. I reached out to patient for update on how he is doing. The patient affirms he has been doing well without any new cardiac symptoms. Still very active as outlined in previous note, able to complete 4 METS doing stationary bike and crunches without new cardiac symptoms.Therefore, based on ACC/AHA guidelines, the patient would be at acceptable risk for the planned procedure without further cardiovascular testing. The patient was advised that if he develops new symptoms prior to surgery to contact our office to arrange for a follow-up visit, and he verbalized understanding.  Per pharmD team, Per office protocol, patient can hold warfarin for 5 days prior to procedure but will require a Lovenox bridge. Patient's wife is aware to contact Coumadin clinic once surgical date is set so they can assist with managing this.  Will route this bundled recommendation to requesting provider via Epic fax function. Please call with questions.   Charlie Pitter, PA-C 09/28/2021, 11:35 AM

## 2021-09-28 NOTE — Progress Notes (Signed)
Remote pacemaker transmission.   

## 2021-09-30 ENCOUNTER — Telehealth: Payer: Self-pay | Admitting: Cardiology

## 2021-09-30 ENCOUNTER — Other Ambulatory Visit: Payer: Self-pay | Admitting: Urology

## 2021-09-30 NOTE — Telephone Encounter (Signed)
Salita from [facility] calling to speak with coumadin clinic. She states the patient has surgery scheduled 2/28 and per his clearance note (see phone note 2/8) needs lovenox bridge. Phone: [phone]

## 2021-09-30 NOTE — Telephone Encounter (Signed)
Pts wife called to speak with coumadin clinic in regards to lovenox bridge routing to the [facility] anticoag pool

## 2021-10-05 ENCOUNTER — Ambulatory Visit (INDEPENDENT_AMBULATORY_CARE_PROVIDER_SITE_OTHER): Payer: Medicare Other

## 2021-10-05 ENCOUNTER — Other Ambulatory Visit: Payer: Self-pay

## 2021-10-05 DIAGNOSIS — Z5181 Encounter for therapeutic drug level monitoring: Secondary | ICD-10-CM | POA: Diagnosis not present

## 2021-10-05 DIAGNOSIS — Z79899 Other long term (current) drug therapy: Secondary | ICD-10-CM | POA: Diagnosis not present

## 2021-10-05 DIAGNOSIS — Z952 Presence of prosthetic heart valve: Secondary | ICD-10-CM

## 2021-10-05 LAB — POCT INR: INR: 2.7 (ref 2.0–3.0)

## 2021-10-05 MED ORDER — ENOXAPARIN SODIUM 100 MG/ML IJ SOSY: 100.0000 mg | PREFILLED_SYRINGE | Freq: Two times a day (BID) | INTRAMUSCULAR | 1 refills | Status: DC

## 2021-10-05 NOTE — Patient Instructions (Signed)
-   continue 1 tablet daily except 1/2 tablet on Wednesday and Saturday  - Repeat INR 2 weeks -TURBT 2/28 Lovenox Bridging;  Coumadin Held 2/23-2/27   2/22: Last dose of warfarin.  2/23: No warfarin or enoxaparin (Lovenox).  2/24: Inject enoxaparin 100mg  in the fatty abdominal tissue at least 2 inches from the belly button twice a day about 12 hours apart, 8am and 8pm rotate sites. No warfarin.  2/25: Inject enoxaparin in the fatty tissue every 12 hours, 8am and 8pm. No warfarin.  2/26: Inject enoxaparin in the fatty tissue every 12 hours, 8am and 8pm. No warfarin.  2/27: Inject enoxaparin in the fatty tissue in the morning at 8 am (No PM dose). No warfarin.  2/28: Procedure Day - No enoxaparin - Resume warfarin in the evening or as directed by doctor (take an extra half tablet with usual dose for 2 days then resume normal dose).  3/1: Resume enoxaparin inject in the fatty tissue every 12 hours and take warfarin  3/2: Inject enoxaparin in the fatty tissue every 12 hours and take warfarin  3/3: Inject enoxaparin in the fatty tissue every 12 hours and take warfarin  3/4: Inject enoxaparin in the fatty tissue every 12 hours and take warfarin  3/5: Inject enoxaparin in the fatty tissue every 12 hours and take warfarin  3/6: warfarin appt to check INR.

## 2021-10-07 ENCOUNTER — Encounter: Payer: Self-pay | Admitting: Internal Medicine

## 2021-10-07 NOTE — Patient Instructions (Signed)
DUE TO COVID-19 ONLY ONE VISITOR IS ALLOWED TO COME WITH YOU AND STAY IN THE WAITING ROOM ONLY DURING PRE OP AND PROCEDURE.   **NO VISITORS ARE ALLOWED IN THE SHORT STAY AREA OR RECOVERY ROOM!!**  IF YOU WILL BE ADMITTED INTO THE HOSPITAL YOU ARE ALLOWED ONLY TWO SUPPORT PEOPLE DURING VISITATION HOURS ONLY (7 AM -8PM)   The support person(s) must pass our screening, gel in and out, and wear a mask at all times, including in the patients room. Patients must also wear a mask when staff or their support person are in the room. Visitors GUEST BADGE MUST BE WORN VISIBLY  One adult visitor may remain with you overnight and MUST be in the room by 8 P.M.  No visitors under the age of 23. Any visitor under the age of 68 must be accompanied by an adult.        Your procedure is scheduled on: 10/13/21   Report to [facility] Main Entrance    Report to admitting at: 10:15 AM   Call this number if you have problems the morning of surgery [phone]   Do not eat food :After Midnight.   After Midnight you may have the following liquids until : 9:30 AM DAY OF SURGERY  Water Black Coffee (sugar ok, NO MILK/CREAM OR CREAMERS)  Tea (sugar ok, NO MILK/CREAM OR CREAMERS) regular and decaf                             Plain Jell-O (NO RED)                                           Fruit ices (not with fruit pulp, NO RED)                                     Popsicles (NO RED)                                                                  Juice: apple, WHITE grape, WHITE cranberry Sports drinks like Gatorade (NO RED) Clear broth(vegetable,chicken,beef)  FOLLOW BOWEL PREP AND ANY ADDITIONAL PRE OP INSTRUCTIONS YOU RECEIVED FROM YOUR SURGEON'S OFFICE!!!    Oral Hygiene is also important to reduce your risk of infection.                                    Remember - BRUSH YOUR TEETH THE MORNING OF SURGERY WITH YOUR REGULAR TOOTHPASTE   Do NOT smoke after Midnight   Take these medicines the  morning of surgery with A SIP OF WATER: metoprolol.  DO NOT TAKE ANY ORAL DIABETIC MEDICATIONS DAY OF YOUR SURGERY                              You may not have any metal on your body including hair pins, jewelry, and body piercing  Do not wear lotions, powders, perfumes/cologne, or deodorant              Men may shave face and neck.   Do not bring valuables to the hospital. [place].   Contacts, dentures or bridgework may not be worn into surgery.   Bring small overnight bag day of surgery.    Patients discharged on the day of surgery will not be allowed to drive home.  Someone NEEDS to stay with you for the first 24 hours after anesthesia.   Special Instructions: Bring a copy of your healthcare power of attorney and living will documents         the day of surgery if you haven't scanned them before.              Please read over the following fact sheets you were given: IF YOU HAVE QUESTIONS ABOUT YOUR PRE-OP INSTRUCTIONS PLEASE CALL [phone]     [facility] - Preparing for Surgery Before surgery, you can play an important role.  Because skin is not sterile, your skin needs to be as free of germs as possible.  You can reduce the number of germs on your skin by washing with CHG (chlorahexidine gluconate) soap before surgery.  CHG is an antiseptic cleaner which kills germs and bonds with the skin to continue killing germs even after washing. Please DO NOT use if you have an allergy to CHG or antibacterial soaps.  If your skin becomes reddened/irritated stop using the CHG and inform your nurse when you arrive at Short Stay. Do not shave (including legs and underarms) for at least 48 hours prior to the first CHG shower.  You may shave your face/neck. Please follow these instructions carefully:  1.  Shower with CHG Soap the night before surgery and the  morning of Surgery.  2.  If you choose to wash your hair, wash your hair  first as usual with your  normal  shampoo.  3.  After you shampoo, rinse your hair and body thoroughly to remove the  shampoo.                           4.  Use CHG as you would any other liquid soap.  You can apply chg directly  to the skin and wash                       Gently with a scrungie or clean washcloth.  5.  Apply the CHG Soap to your body ONLY FROM THE NECK DOWN.   Do not use on face/ open                           Wound or open sores. Avoid contact with eyes, ears mouth and genitals (private parts).                       Wash face,  Genitals (private parts) with your normal soap.             6.  Wash thoroughly, paying special attention to the area where your surgery  will be performed.  7.  Thoroughly rinse your body with warm water from the neck down.  8.  DO NOT shower/wash with your normal soap  after using and rinsing off  the CHG Soap.                9.  Pat yourself dry with a clean towel.            10.  Wear clean pajamas.            11.  Place clean sheets on your bed the night of your first shower and do not  sleep with pets. Day of Surgery : Do not apply any lotions/deodorants the morning of surgery.  Please wear clean clothes to the hospital/surgery center.  FAILURE TO FOLLOW THESE INSTRUCTIONS MAY RESULT IN THE CANCELLATION OF YOUR SURGERY PATIENT SIGNATURE_________________________________  NURSE SIGNATURE__________________________________  ________________________________________________________________________

## 2021-10-07 NOTE — Progress Notes (Signed)
PERIOPERATIVE PRESCRIPTION FOR IMPLANTED CARDIAC DEVICE PROGRAMMING  Patient Information: Name:  Robert Anderson  DOB:  1924/02/11  MRN:  [id number]    Joline Maxcy, RN  P Cv Div Heartcare Device Planned Procedure:  Transurethral resection of bladder tumor.  Surgeon:  Dr. Harold Barban.  Date of Procedure:  10/13/21  Cautery will be used.  Position during surgery:     Please send documentation back to:  [facility] (Fax # [phone])   Joline Maxcy, RN  10/07/2021 10:39 AM  Device Information:  Clinic EP Physician:  Cristopher Peru, MD   Device Type:  Pacemaker Manufacturer and Phone #:  St. Jude/Abbott: [phone] Pacemaker Dependent?:  Yes.   Date of Last Device Check:  09/22/21 Remote; 11/28/20 In-clinic Normal Device Function?:  Yes.    Electrophysiologist's Recommendations:  Have magnet available. Provide continuous ECG monitoring when magnet is used or reprogramming is to be performed.  Procedure may interfere with device function.  Magnet should be placed over device during procedure.  Per Device Clinic [address] [place] Ram, RN  12:28 PM 10/07/2021

## 2021-10-08 ENCOUNTER — Encounter (HOSPITAL_COMMUNITY)
Admission: RE | Admit: 2021-10-08 | Discharge: 2021-10-08 | Disposition: A | Discharge status: Home / Self Care | Payer: Medicare Other | Source: Ambulatory Visit | Attending: Urology | Admitting: Urology

## 2021-10-08 ENCOUNTER — Encounter (HOSPITAL_COMMUNITY): Payer: Self-pay

## 2021-10-08 ENCOUNTER — Other Ambulatory Visit: Payer: Self-pay

## 2021-10-08 VITALS — BP 143/78 | HR 63 | Temp 97.4°F | Resp 18 | Ht 69.0 in | Wt 229.0 lb

## 2021-10-08 DIAGNOSIS — I10 Essential (primary) hypertension: Secondary | ICD-10-CM | POA: Diagnosis not present

## 2021-10-08 DIAGNOSIS — Z01812 Encounter for preprocedural laboratory examination: Secondary | ICD-10-CM | POA: Insufficient documentation

## 2021-10-08 DIAGNOSIS — I4891 Unspecified atrial fibrillation: Secondary | ICD-10-CM | POA: Insufficient documentation

## 2021-10-08 DIAGNOSIS — Z8546 Personal history of malignant neoplasm of prostate: Secondary | ICD-10-CM | POA: Insufficient documentation

## 2021-10-08 DIAGNOSIS — C679 Malignant neoplasm of bladder, unspecified: Secondary | ICD-10-CM | POA: Insufficient documentation

## 2021-10-08 DIAGNOSIS — Z952 Presence of prosthetic heart valve: Secondary | ICD-10-CM | POA: Insufficient documentation

## 2021-10-08 DIAGNOSIS — I447 Left bundle-branch block, unspecified: Secondary | ICD-10-CM | POA: Insufficient documentation

## 2021-10-08 DIAGNOSIS — Z8673 Personal history of transient ischemic attack (TIA), and cerebral infarction without residual deficits: Secondary | ICD-10-CM | POA: Diagnosis not present

## 2021-10-08 DIAGNOSIS — Z7901 Long term (current) use of anticoagulants: Secondary | ICD-10-CM | POA: Insufficient documentation

## 2021-10-08 DIAGNOSIS — Z01818 Encounter for other preprocedural examination: Secondary | ICD-10-CM

## 2021-10-08 DIAGNOSIS — Z950 Presence of cardiac pacemaker: Secondary | ICD-10-CM | POA: Diagnosis not present

## 2021-10-08 LAB — CBC
HCT: 44.5 % (ref 39.0–52.0)
Hemoglobin: 14.4 g/dL (ref 13.0–17.0)
MCH: 29.6 pg (ref 26.0–34.0)
MCHC: 32.4 g/dL (ref 30.0–36.0)
MCV: 91.4 fL (ref 80.0–100.0)
Platelets: 200 10*3/uL (ref 150–400)
RBC: 4.87 MIL/uL (ref 4.22–5.81)
RDW: 13.2 % (ref 11.5–15.5)
WBC: 6.8 10*3/uL (ref 4.0–10.5)
nRBC: 0 % (ref 0.0–0.2)

## 2021-10-08 LAB — BASIC METABOLIC PANEL
Anion gap: 5 (ref 5–15)
BUN: 21 mg/dL (ref 8–23)
CO2: 26 mmol/L (ref 22–32)
Calcium: 9.3 mg/dL (ref 8.9–10.3)
Chloride: 106 mmol/L (ref 98–111)
Creatinine, Ser: 0.9 mg/dL (ref 0.61–1.24)
GFR, Estimated: >60 mL/min (ref >60)
Glucose, Bld: 101 mg/dL — ABNORMAL HIGH (ref 70–99)
Potassium: 4.4 mmol/L (ref 3.5–5.1)
Sodium: 137 mmol/L (ref 135–145)

## 2021-10-08 NOTE — Progress Notes (Signed)
Robert Anderson from [place] has been notified about this surgery.

## 2021-10-08 NOTE — Progress Notes (Addendum)
For Short Stay: Coffee appointment date: N/A Date of COVID positive in last 90 days: N/A COVID Vaccine: 06/07/21 x 4 Bowel Prep reminder: N/A   For Anesthesia: PCP - NP: Dorothyann Peng Cardiologist - Dr. Peter Martinique. LOV: 09/01/21. Clearance: Dayna Dunn: PAC: 09/28/21: Epic.  Chest x-ray -  EKG - 09/01/21 Stress Test -  ECHO -  Cardiac Cath -  Pacemaker/ICD device last checked: 09/22/21 Pacemaker orders received: On Epic/Chart Device Rep notified: Yes  Spinal Cord Stimulator: N/A  Sleep Study -  CPAP -   Fasting Blood Sugar -  Checks Blood Sugar _____ times a day Date and result of last Hgb A1c-  Blood Thinner Instructions: Coumadin on hold since 10/07/21.Lovenox: started on: 10/09/21. Aspirin Instructions: Last Dose:  Activity level: Can go up a flight of stairs and activities of daily living without stopping and without chest pain and/or shortness of breath   Able to exercise without chest pain and/or shortness of breath   Unable to go up a flight of stairs without chest pain and/or shortness of breath     Anesthesia review: Hx: HTN,Pacemaker,Stroke,Afib  Patient denies shortness of breath, fever, cough and chest pain at PAT appointment   Patient verbalized understanding of instructions that were given to them at the PAT appointment. Patient was also instructed that they will need to review over the PAT instructions again at home before surgery.

## 2021-10-09 NOTE — Anesthesia Preprocedure Evaluation (Addendum)
Anesthesia Evaluation  Patient identified by MRN, date of birth, ID band Patient awake    Reviewed: Allergy & Precautions, H&P , NPO status , Patient's Chart, lab work & pertinent test results  Airway Mallampati: II  TM Distance: >3 FB Neck ROM: Full    Dental no notable dental hx. (+) Teeth Intact, Dental Advisory Given   Pulmonary neg pulmonary ROS,    Pulmonary exam normal breath sounds clear to auscultation       Cardiovascular Exercise Tolerance: Good METS: 5 - 7 Mets hypertension, Pt. on medications and Pt. on home beta blockers + dysrhythmias Atrial Fibrillation + pacemaker  Rhythm:Regular Rate:Normal  S/p TAVR   Neuro/Psych TIAnegative psych ROS   GI/Hepatic negative GI ROS, Neg liver ROS,   Endo/Other  negative endocrine ROS  Renal/GU negative Renal ROS  negative genitourinary   Musculoskeletal  (+) Arthritis , Osteoarthritis,    Abdominal   Peds  Hematology negative hematology ROS (+)   Anesthesia Other Findings   Reproductive/Obstetrics negative OB ROS                           Anesthesia Physical Anesthesia Plan  ASA: 3  Anesthesia Plan: General   Post-op Pain Management: Tylenol PO (pre-op)*   Induction: Intravenous  PONV Risk Score and Plan: 3 and Ondansetron, Dexamethasone and Treatment may vary due to age or medical condition  Airway Management Planned: LMA  Additional Equipment:   Intra-op Plan:   Post-operative Plan: Extubation in OR  Informed Consent: I have reviewed the patients History and Physical, chart, labs and discussed the procedure including the risks, benefits and alternatives for the proposed anesthesia with the patient or authorized representative who has indicated his/her understanding and acceptance.     Dental advisory given  Plan Discussed with: CRNA  Anesthesia Plan Comments: (See PAT note 10/08/21)       Anesthesia Quick  Evaluation

## 2021-10-09 NOTE — Progress Notes (Signed)
Anesthesia Chart Review   Case: [id number] Date/Time: 10/13/21 1215   Procedures:      TRANSURETHRAL RESECTION OF BLADDER TUMOR (TURBT) - 42 MINS     CYSTOSCOPY WITH RETROGRADE PYELOGRAM (Bilateral)   Anesthesia type: General   Pre-op diagnosis: BLADDER CANCER   Location: [place] [facility]   Surgeons: Remi Haggard, MD       DISCUSSION:88 y.o. never smoker with h/o HTN, LBBB, atrial fibrillation, s/p TAVR 2016, pacemaker in place, stroke, prostate cancer, bladder cancer stable for above procedure 10/13/2021 with Dr. Harold Barban.   Per cardiology preoperative evaluation 09/28/21, "Chart reviewed as part of pre-operative protocol coverage. Patient was contacted 09/28/2021 in reference to pre-operative risk assessment for pending surgery as outlined below.  [name] was last seen 09/01/21 by Dr. Martinique, history reviewed, felt to be doing well at that visit. RCRI calculated at 0.9% indicating low CV risk. Age seems to be patient's primary risk factor at this juncture. Reached out to pt to ensure no new sx. Got Vm. LMTCB.   Addendum: Pt & wife returned call. Wife assisted with call. I reached out to patient for update on how he is doing. The patient affirms he has been doing well without any new cardiac symptoms. Still very active as outlined in previous note, able to complete 4 METS doing stationary bike and crunches without new cardiac symptoms.Therefore, based on ACC/AHA guidelines, the patient would be at acceptable risk for the planned procedure without further cardiovascular testing. The patient was advised that if he develops new symptoms prior to surgery to contact our office to arrange for a follow-up visit, and he verbalized understanding.   Per pharmD team, Per office protocol, patient can hold warfarin for 5 days prior to procedure but will require a Lovenox bridge. Patient's wife is aware to contact Coumadin clinic once surgical date is set so they can assist with managing  this."  Anticipate pt can proceed with planned procedure barring acute status change.   VS: BP (!) 143/78    Pulse 63    Temp (!) 36.3 C (Oral)    Resp 18    Ht 5\' 9"  (1.753 m)    Wt 103.9 kg    SpO2 96%    BMI 33.82 kg/m   PROVIDERS: Dorothyann Peng, NP is PCP   Primary Cardiologist: Peter Martinique, MD LABS: Labs reviewed: Acceptable for surgery. (all labs ordered are listed, but only abnormal results are displayed)  Labs Reviewed  BASIC METABOLIC PANEL - Abnormal; Notable for the following components:      Result Value   Glucose, Bld 101 (*)    All other components within normal limits  CBC     IMAGES:   EKG: 09/01/2021 Rate 60 bpm  AV dual-paced rhythm   CV: Echo 08/26/20 1. Severe asymmetric septal hypertrophy up to 22 mm consistent with  history of hypertrophic cardiomyopathy. No LVOT obstruction or SAM of the  mitral valve. Left ventricular ejection fraction, by estimation, is 65 to  70%. The left ventricle has normal  function. The left ventricle has no regional wall motion abnormalities.  There is severe asymmetric left ventricular hypertrophy of the septal  segment. Left ventricular diastolic parameters are consistent with Grade  III diastolic dysfunction  (restrictive).   2. Right ventricular systolic function is normal. The right ventricular  size is normal. Tricuspid regurgitation signal is inadequate for assessing  PA pressure.   3. Left atrial size was mildly dilated.  4. The mitral valve is grossly normal. Mild mitral valve regurgitation.  No evidence of mitral stenosis.   5. 29 mm S3 in aortic position. Vmax 2.0 m/s, MG 8.0 mmHG, EOA 3.22 cm2,  DI 0.71. No paravalvular leak. The aortic valve has been  repaired/replaced. Aortic valve regurgitation is not visualized. Procedure  Date: 03/25/2015. Echo findings are consistent  with normal structure and function of the aortic valve prosthesis.   6. The inferior vena cava is normal in size with greater than  50%  respiratory variability, suggesting right atrial pressure of 3 mmHg.  Past Medical History:  Diagnosis Date   Atrial fibrillation [facility])    a. dx after lead revision 06/26/15 >> anticoag not started due to recent pericardial effusion in setting of perforation and lead revision   Bladder cancer ([place])    chemo done 2021   Bradycardia    a. HR 30s in clinic 06/2015 - BB discontinued.   DEGENERATIVE JOINT DISEASE, SPINE    ERECTILE DYSFUNCTION    Essential hypertension    First degree AV block    Hearing deficit    wears hearing aides both ears   HEARING LOSS    Heart block    s/p Pacemaker   Hyperlipidemia    Hypertrophic obstructive cardiomyopathy (HCC)    LBBB (left bundle branch block)    Macular degeneration    left eye gets shots for   OA (osteoarthritis)    both hips   Pericardial effusion    a. 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 s/p TAVR 03/2015. Pre-op  LHC 02/2015: minor nonobstructive CAD.   Squamous cell skin cancer    left lower leg   Stroke ([place]) 02/19/2014   Small infarct high posterior left frontal lobe on MRI     TIA (transient ischemic attack) 01/21/2014   5 min spell aphasia without assoc sx     Uses walker    Wears glasses     Past Surgical History:  Procedure Laterality Date   ANTERIOR CERVICAL DECOMP/DISCECTOMY FUSION  ~ 2000   Dr. Louanne Skye with bone graft   CARDIAC CATHETERIZATION N/A 02/20/2015   Procedure: Right/Left Heart Cath and Coronary Angiography;  Surgeon: Sherren Mocha, MD;  Location: [place] CV LAB;  Service: Cardiovascular;  Laterality: N/A;   CARDIAC VALVE REPLACEMENT     CATARACT EXTRACTION W/ INTRAOCULAR  LENS  IMPLANT, BILATERAL  2004; 2010   right; left   CYSTOSCOPY W/ RETROGRADES Bilateral 04/22/2020   Procedure: CYSTOSCOPY WITH RETROGRADE PYELOGRAM/FULGERATION BLADDER BIOPSY;  Surgeon: Remi Haggard, MD;  Location: [facility];  Service: Urology;  Laterality: Bilateral;   CYSTOSCOPY W/ RETROGRADES Bilateral 09/09/2020   Procedure: CYSTOSCOPY WITH RETROGRADE PYELOGRAM;  Surgeon: Remi Haggard, MD;  Location: [facility];  Service: Urology;  Laterality: Bilateral;   EP IMPLANTABLE DEVICE N/A 06/24/2015   Procedure: Pacemaker Implant;  Surgeon: Evans Lance, MD;  Location: [place] CV LAB;  Service: Cardiovascular; St Jude Assurity    EP IMPLANTABLE DEVICE N/A 06/26/2015   Procedure: Lead Revision/Repair;  Surgeon: Will Meredith Leeds, MD;  Location: [place] CV LAB;  Service: Cardiovascular;  Laterality: N/A;  INSERT / REPLACE / REMOVE PACEMAKER  06/24/2015   JOINT REPLACEMENT     SQUAMOUS CELL CARCINOMA EXCISION Left X 3   "lower leg"   TEE WITHOUT CARDIOVERSION N/A 03/07/2015   Procedure: TRANSESOPHAGEAL ECHOCARDIOGRAM (TEE);  Surgeon: Dorothy Spark, MD;  Location: [place];  Service: Cardiovascular;  Laterality: N/A;   TEE WITHOUT CARDIOVERSION N/A 03/25/2015   Procedure: TRANSESOPHAGEAL ECHOCARDIOGRAM (TEE);  Surgeon: Burnell Blanks, MD;  Location: [place];  Service: Open Heart Surgery;  Laterality: N/A;   TONSILLECTOMY  as child   TOTAL SHOULDER ARTHROPLASTY Right 2007   "wore it out playing tennis"   TRANSCATHETER AORTIC VALVE REPLACEMENT, TRANSFEMORAL N/A 03/25/2015   Procedure: TRANSCATHETER AORTIC VALVE REPLACEMENT, TRANSFEMORAL;  Surgeon: Burnell Blanks, MD;  Location: [place];  Service: Open Heart Surgery;  Laterality: N/A;   TRANSURETHRAL RESECTION OF BLADDER TUMOR N/A 09/09/2020   Procedure: TRANSURETHRAL RESECTION OF BLADDER TUMOR (TURBT);  Surgeon: Remi Haggard, MD;  Location: [facility];  Service: Urology;  Laterality:  N/A;  30 MINS    MEDICATIONS:  acetaminophen (TYLENOL) 500 MG tablet   amoxicillin (AMOXIL) 500 MG capsule   docusate sodium (COLACE) 100 MG capsule   enoxaparin (LOVENOX) 100 MG/ML injection   metoprolol succinate (TOPROL-XL) 25 MG 24 hr tablet   mirabegron ER (MYRBETRIQ) 50 MG TB24 tablet   Multiple Vitamins-Minerals (PRESERVISION AREDS 2+MULTI VIT PO)   Omega-3 Fatty Acids (FISH OIL) 1000 MG CAPS   PREVIDENT 5000 SENSITIVE 1.1-5 % PSTE   rosuvastatin (CRESTOR) 5 MG tablet   tamsulosin (FLOMAX) 0.4 MG CAPS capsule   warfarin (COUMADIN) 3 MG tablet   No current facility-administered medications for this encounter.    Robert Felix Ward, PA-C [facility] Pre-Surgical Testing [phone]

## 2021-10-12 ENCOUNTER — Telehealth: Payer: Self-pay

## 2021-10-12 NOTE — Telephone Encounter (Signed)
I spoke to the patient's wife who called because he is having bleeding from the gums.  He is on Lovenox Bridge prior to his upcoming 2/28 surgery.    He was advised by physician 2/25 to administer only 1 Lovenox injection since Saturday 2/25.  I informed him that I will call them 3/1 to f/u post surgery.  They verbalized understanding.

## 2021-10-12 NOTE — H&P (Signed)
Robert Anderson is a 88 year-old male established patient who is here for blood in the urine.   10/19/2019: Here today for evaluation of gross hematuria. He is on coumadin for hx of aortic valve replacement, Afib. His last INR was 3.3.   Approximately 2 weeks ago the patient noted painless passage of hematuria with clot material that cleared with next void only to reoccur last night. Denies any visible blood or painful/burning urination today. He does have microscopic hematuria on exam. Voiding symptoms are stable, not worsening. He continues tamsulosin and Myrbetriq. He tells me his frequency and nocturia are better controlled with taking the Myrbetriq. No progressively worsening ability to start his stream. He denies any associated lower back or flank pain/discomfort. He denies past history of kidney stones.   02/15/2020: Under cystoscopic evaluation at time of last office visit he was found to have a moderate bulbous urethral stricture. This was passively dilated using the cystoscope. This plus exam findings consistent with past radiation therapy in the bladder thought to be the source of his hematuria. PRN f/u was recommended. Of note, the patient remains on Warfarin for past hx of Afib, pacemaker, AVR. Last INR was 2.9.   Pt has been having intermittent passage of hematuria for the past two weeks. Sometimes associated with some mild burning and stinging with voiding. He also notes having worsening urinary frequency and urgency as well as nocturia despite ongoing use of Myrbetriq and tamsulosin. This has been progressively worsening over the past several months. Not associated with f/c, n/v, new or worsening flank/lower back pain or discomfort.   Patient tells me he has not been taking tamsulosin. He does have some increased hesitancy, mild straining before initiating stream but states stream feels appropriate. He denies any splitting or spraying of his stream. He does mention to me he has been doing  exercises at home in the form of abdominal crunch is as well as riding a stationary bike for 10-15 minutes at a time a few times per day.  -02/19/20-patient is a 88 year old white male with history of prostate cancer diagnosed in March 2010. Underwent radiation therapy completed 03/07/2009. Previously followed by Dr. Karsten Ro. Patient has had some issues with recurrence intermittent hematuria. Was seen by Dr. Karsten Ro back in March 2021 and cysto that time revealed a bulbous stricture which was dilated by passing of the scope. Bladder was described as having no obvious lesions of the cyst than some hypervascularity in the bladder. Patient also had CT scan on 10/24/2019 showed essentially normal upper urinary tract. Patient was recently seen by their Noberto Retort and was noted to have some microscopic hematuria with history of recurring gross hematuria. Schedule for follow-up here.  -03/31/20-patient with history of prostate cancer status post radiation therapy completed in 2010 as above. Had some recurrent episodes of hematuria and underwent cysto on 02/19/2020 showing some friable blood vessels at the bladder neck but otherwise normal bladder. In the interim the patient has had some recurrent episodes of hematuria. Blood became worse over the weekend and he has had some associated dysuria as well. Denies any fever or flank pain. Remains on Coumadin. Urine today is grossly bloody..  I attempted flexible cysto today and saw no active bleeding within the prostatic fossa. Urine in the bladder made visualization difficult and I could not delineate an obvious source. Ureteral orifices were not visualized.  -04/07/20-patient with history of prostate cancer status post radiation therapy completed in 2010 as above. Has had recurrent bouts of hematuria and  at last clinic visit on 03/31/2020 underwent cysto which was inconclusive on the actual source of hematuria but visualization was poor due to the hematuria. His PT level was up  with an INR of 3. I attempted several times to contact the patient was unable to get him by phone. In the interim the patient had followed up with his primary care physician and they have modified his Coumadin dose to reduce it. He is scheduled undergo repeat PT checked later in the week. In the meantime he has had some some intermittent gross hematuria which is fairly persistent currently. He did have a few days where cleared over the last week. He is not passing any significant clots.   -04/14/20-patient with history of adenocarcinoma the prostate status post radiation therapy completed in 2010 as above. Had some recent hematuria unclear etiology and Coumadin level was too high. This has been modified by PCP over the last several days to try to get a more normal INR for Coumadin. Over the last several days he has noted continued gross painless hematuria. He has not passing clots and is voiding satisfactorily.  -04/18/20-patient with history of adenocarcinoma the prostate status post external beam radiation therapy completed in 2010. Has had some continued gross painless hematuria. Cysto was nondiagnostic in the office due to poor visualization. He has been off his Coumadin now for 5 days. He states that hematuria is now resolved since stopping the Coumadin.  -04/28/20-patient with history of adenocarcinoma the prostate status post external beam radiation therapy in 2010. Underwent recent cysto and bladder biopsy and fulguration on 04/22/2020. There is a focal area in the right posterolateral portion of the bladder which was suspicious for either radiation cystitis versus possible CIS. 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 This area was fulgurated as well. Since procedure the patient has had no further hematuria. No issues postprocedure early  -05/14/20-patient with history of adenocarcinoma of the prostate status post external beam radiation therapy. Had some hematuria and underwent cysto  bladder biopsy showing carcinoma in situ. Here now for follow-up. He is back on his blood thinner. The patient has had no recurrent gross hematuria.  -08/14/20-patient with history of adenocarcinoma the prostate status post external beam radiation therapy. Had some hematuria and recent bladder biopsy showed carcinoma in situ. Subsequently underwent intravesical BCG induction therapy from 06/06/2020 through 07/10/1999 21. Tolerated treatment satisfactorily. He is now here for follow-up cystoscopy.  Cysto performed today and shows: Small recurrent papillary tumor along the right lateral bladder wall measuring about a half to 1 cm in size. There is also a mild erythematous area in the right posterolateral aspect of the bladder.  -09/18/20-patient with history of adenocarcinoma the prostate status post external beam radiation therapy in 2010. Had recent diagnosis of bladder cancer on 04/22/2020. This was followed by BCG a as he had carcinoma in situ. Had small papillary recurrence noted at last cysto on 08/14/2020. He has subsequently undergone cysto and TURBT on 09/09/2020. 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 Adjacent biopsy of erythematous tissue showed benign urothelial tissue no evidence of recurrent CIS. Patient had no postoperative issues. Here to discuss next steps of management  -12/18/20-patient history of adenocarcinoma the prostate status post external beam radiation therapy in 2010. Also had recent diagnosis bladder cancer diagnosed in September of 2021. Resection was followed by BCG as he had carcinoma in situ. Most recent documented recurrence was on 09/09/2020 showing noninvasive low-grade papillary transitional cell carcinoma. Opted for 3 month  follow-up surveillance. No interim GU issues. Here for follow-up surveillance cysto.  Cysto is performed today and shows: Marked trabeculation of the urinary bladder but no obvious recurrent bladder lesions noted.  Micro  urinalysis today is clear on urine spun sediment  -03/26/21-patient with history of adenocarcinoma the prostate status post external beam radiation therapy completed in 2010 as above. Patient had recurrent carcinoma in situ in September of 2021 followed by intravesical BCG induction therapy completed in November of 2021. Has had recurrent lesion removed in January 2022 showing noninvasive low-grade papillary urothelial carcinoma. No interim GU issues. Here for follow-up surveillance cysto.   Cysto is performed today and shows: Trabeculated bladder but no obvious mucosal recurrence. There was 1 slightly erythematous area in the posterior aspect of bladder by think this was just hypervascularity of the urothelium. Not raised. Previous resection site was devoid of tumor.  -06/25/21-patient with history of adenocarcinoma the prostate status post external beam radiation therapy completed in 2010. Also had his bladder cancer in September 2021. Had recurrent carcinoma in situ in September 2021 followed by intravesical BCG induction therapy completed in November 2021. This recent recurrence was in January 2022 showing noninvasive low-grade papillary urothelial carcinoma. Here for follow-up surveillance cystoscopy.  Cystoscopy is performed today and shows trabeculated bladder. There are some mild erythema in the right lateral bladder wall which I think is hypervascularity. It is not raised and has low suspicion for recurrence. No other obvious mucosal lesions noted. There is bilobar prostatic hypertrophy with approximate 3 cm prostatic urethral length.  -09/21/21-patient with history of adenocarcinoma the prostate status post external beam radiation therapy completed in 2010 also history of bladder cancer diagnosed in September 2021. Had recurrent carcinoma in situ in September 21 followed by intravesical BCG completed in November 21. The most recent recurrence was in January 22 showing noninvasive low-grade papillary  urothelial lesion. Cystoscopy in November 22 showed hypervascularity right lateral bladder wall .Marland Kitchen No interim gross hematuria. Here for follow-up surveillance cystoscopy  Cystoscopy is performed today and shows: What appears to be a recurrent 1 cm papillary lesion on the right lateral bladder wall going towards the dome. There are couple small erythematous areas particularly in the left floor of the bladder and one adjacent to a prior TURBT site on the right lateral bladder wall which are likely hypervascularity.     ALLERGIES: No Allergies    MEDICATIONS: Metoprolol Succinate  Myrbetriq 50 mg tablet, extended release 24 hr 1 tablet PO Daily  Warfarin Sodium 3 mg tablet  Acetaminophen  Colace  Docusate Sodium 100 mg capsule  Fish Oil  Preservision Areds  Prevident  Rosuvastatin Calcium 5 mg tablet     GU PSH: Bladder Instill AntiCA Agent - 07/09/2020, 07/02/2020, 06/18/2020, 06/11/2020, 06/04/2020, 05/28/2020 Cysto Bladder Ureth Biopsy - 04/22/2020 Cysto Dilate Stricture (M or F) - 02/19/2020, 10/30/2019 Cystoscopy - 06/25/2021, 03/26/2021, 12/18/2020, 08/14/2020, 03/31/2020 Cystoscopy TURBT 2-5 cm - 09/09/2020 Locm 300-399Mg /Ml Iodine,1Ml - 10/24/2019       PSH Notes: Radiation Therapy, Shoulder Surgery, Neck Surgery, Cataract Surgery   NON-GU PSH: Neck Surgery (Unspecified) Pacemaker placement Shoulder Surgery (Unspecified)     GU PMH: Bladder Cancer Lateral - 06/25/2021, - 03/26/2021, - 12/18/2020, - 09/18/2020, - 08/14/2020 CIS of the bladder - 06/25/2021, - 03/26/2021, - 09/18/2020, - 08/14/2020, - 07/09/2020, - 07/02/2020, - 06/18/2020, - 06/11/2020, - 06/04/2020, - 05/28/2020, - 05/21/2020, - 05/14/2020, - 04/28/2020 History of prostate cancer - 08/14/2020, - 05/14/2020, - 04/28/2020, - 04/18/2020, - 10/19/2019 (Stable), He has  a history of prostate cancer. PSA fell to nearly undetectable it has been 10 years now., - 05/21/2019, There was no evidence of prostate cancer on his CT scan done on 07/06/15.  His PSA at 0.07 is consistent with adequately treated prostate cancer., 11-21-15 Gross hematuria - 04/18/2020, (Stable), - 04/14/2020 (Stable), - 04/07/2020, - 02/15/2020, It appears his gross hematuria was either secondary to a mild bulbar urethral stricture identified or possibly from some superficial vessels noted within the bladder consistent with radiation changes., - 21-Nov-2019, - 10/19/2019 Urinary Frequency - 02/19/2020 Urinary Hesitancy - 02/19/2020 Nocturia (Stable) - 02/15/2020, He has nocturia with no significant lower extremity swelling noted on exam. His PVR was 0 so I am going to give him Myrbetriq 25 mg samples and see so he will contact me for a prescription., - 05/21/2019 Urinary Urgency - 02/15/2020 Bulbar urethral stricture, He was found to have a mild bulbar urethral stricture that I was able to dilate with the cystoscope today. I told him he may have some initial hematuria the next time were to when he urinates. - 11/21/2019 Left testicular pain, His left testicular pain with bleeding and I have told him that I did not feel that this was in any way related to prostate cancer. The pain has resolved. Nov 21, 2015 Overactive bladder, He has symptoms of bladder overactivity likely secondary to the effects of radiation. I told him to remain on the tamsulosin and I was going to add Myrbetriq 25 mg to see if he notes improvement. If he does he will contact me for further description. 11/21/15 BPH w/LUTS, Benign prostatic hyperplasia with urinary obstruction - 2012-11-20 ED due to arterial insufficiency, Erectile dysfunction due to arterial insufficiency - 11-20-2012 History of urolithiasis, Nephrolithiasis - 11/20/12 Inflammatory Disease Prostate, Unspec, Prostatitis - 2012-11-20 Primary hypogonadism, Hypogonadism, testicular - 11/20/2012      PMH Notes: He has adenocarcinoma of the prostate that was diagnosed after his PSA was found to have elevated to 7.28. His PSA had been slowly rising over the years. Despite a benign feeling prostate I  did recommended transrectal ultrasound and biopsy which was performed on 11/07/08 and was found to be positive for adenocarcinoma. There was a single core that contained Gleason 4+3 = 7 with the remaining cores showing a 3+4 and 3+3 pattern. A total of 6/12 cores were positive. He was treated with external beam radiation therapy which he completed on 03/07/09.    He has been seen in the past by Dr. Leory Plowman and was treated for prostatitis as well as hypogonadism with testosterone replacement. He is not currently on any testosterone replacement therapy.    He also had erectile dysfunction that was treated by him as well.     NON-GU PMH: Pyuria/other UA findings (Stable) - 05/14/2020, - 03/31/2020 Personal history of other diseases of the circulatory system, History of hypertension - 11/20/12, History of cardiac disorder, - Nov 20, 2012 Personal history of other diseases of the nervous system and sense organs, History of sleep apnea - 2012-11-20 Personal history of other infectious and parasitic diseases, History of hepatitis - 11-20-12    FAMILY HISTORY: Death - Mother, Father Family Health Status Number - Runs In Family Lung Cancer - Brother Prostate Cancer - Brother Stroke Syndrome - Mother, Father   SOCIAL HISTORY: Marital Status: Married Preferred Language: English; Ethnicity: Not Hispanic Or Latino; Race: White Current Smoking Status: Patient has never smoked.  Has never drank.  Drinks 1 caffeinated drink per day.  Notes: Never A Smoker, Caffeine Use, Marital History - Currently Married, Alcohol Use, Previous History Of Smoking   REVIEW OF SYSTEMS:    GU Review Male:   Patient reports leakage of urine, stream starts and stops, trouble starting your stream, and have to strain to urinate . Patient denies frequent urination, hard to postpone urination, burning/ pain with urination, get up at night to urinate, erection problems, and penile pain.  Gastrointestinal (Upper):   Patient denies nausea, vomiting,  and indigestion/ heartburn.  Gastrointestinal (Lower):   Patient denies diarrhea and constipation.  Constitutional:   Patient denies fever, night sweats, weight loss, and fatigue.  Skin:   Patient denies skin rash/ lesion and itching.  Eyes:   Patient denies blurred vision and double vision.  Ears/ Nose/ Throat:   Patient denies sore throat and sinus problems.  Hematologic/Lymphatic:   Patient denies swollen glands and easy bruising.  Cardiovascular:   Patient denies leg swelling and chest pains.  Respiratory:   Patient denies cough and shortness of breath.  Endocrine:   Patient denies excessive thirst.  Musculoskeletal:   Patient reports back pain. Patient denies joint pain.  Neurological:   Patient denies headaches and dizziness.  Psychologic:   Patient denies depression and anxiety.   Notes: Reviewed previous review of systems 06/25/2021. No changes.   VITAL SIGNS: None   GU PHYSICAL EXAMINATION:    Urethral Meatus: Normal size. No lesion, no wart, no discharge, no polyp. Normal location.  Penis: Penis uncircumcised. No foreskin warts, no cracks. No dorsal peyronie's plaques, no left corporal peyronie's plaques, no right corporal peyronie's plaques, no scarring, no shaft warts. No balanitis, no meatal stenosis.    MULTI-SYSTEM PHYSICAL EXAMINATION:    Constitutional: Obese. No physical deformities. Normally developed. Good grooming.   Neck: Neck symmetrical, not swollen. Normal tracheal position.  Respiratory: No labored breathing, no use of accessory muscles.   Cardiovascular: Normal temperature, normal extremity pulses, no swelling, no varicosities.  Lymphatic: No enlargement of neck, axillae, groin.  Skin: No paleness, no jaundice, no cyanosis. No lesion, no ulcer, no rash.  Neurologic / Psychiatric: Oriented to time, oriented to place, oriented to person. No depression, no anxiety, no agitation.  Eyes: Normal conjunctivae. Normal eyelids.  Ears, Nose, Mouth, and Throat: Left ear  no scars, no lesions, no masses. Right ear no scars, no lesions, no masses. Nose no scars, no lesions, no masses. Normal hearing. Normal lips.  Musculoskeletal: Walks with walker     Complexity of Data:  Source Of History:  Patient  Records Review:   Previous Doctor Records, Previous Hospital Records, Previous Patient Records  Urine Test Review:   Urinalysis   07/29/16 05/20/15 05/03/13 07/05/11 06/30/10 12/23/09 07/01/09  PSA  Total PSA 0.07 ng/dl 0.07 ng/dl 0.07 ng/dl 0.17  0.24  0.25  0.60     PROCEDURES:         Flexible Cystoscopy - 52000  Risks, benefits, and some of the potential complications of the procedure were discussed at length with the patient including infection, bleeding, voiding discomfort, urinary retention, fever, chills, sepsis, and others. All questions were answered. Informed consent was obtained. Antibiotic prophylaxis was given. Sterile technique and intraurethral analgesia were used.  Meatus:  Normal size. Normal location. Normal condition.  Urethra:  No strictures.  External Sphincter:  Normal.  Verumontanum:  Normal.  Prostate:  Non-obstructing. No hyperplasia.  Bladder Neck:  Non-obstructing.  Ureteral Orifices:  Normal location. Normal size. Normal shape. Effluxed clear urine.  Bladder:  Cystoscopy is performed today and shows: What appears to be a recurrent 1 cm papillary lesion on the right lateral bladder wall going towards the dome. There are couple small erythematous areas particularly in the left floor of the bladder and one adjacent to a prior TURBT site on the right lateral bladder wall which are likely hypervascularity.      The lower urinary tract was carefully examined. The procedure was well-tolerated and without complications. Antibiotic instructions were given. Instructions were given to call the office immediately for bloody urine, difficulty urinating, urinary retention, painful or frequent urination, fever, chills, nausea, vomiting or other  illness. The patient stated that he understood these instructions and would comply with them.         Urinalysis - 81003 Dipstick Dipstick Cont'd  Specimen: Voided Bilirubin: Neg  Color: Yellow Ketones: Neg  Appearance: Clear Blood: Neg  Specific Gravity: 1.025 Protein: Neg  pH: 5.5 Urobilinogen: 0.2  Glucose: Neg Nitrites: Neg    Leukocyte Esterase: Trace    ASSESSMENT:      ICD-10 Details  1 GU:   Bladder Cancer Lateral - H88.8 Acute, Complicated Injury  2   History of prostate cancer - Z85.46 Chronic, Stable   PLAN:           Document Letter(s):  Created for Patient: Clinical Summary         Notes:   I discussed cystoscopic findings with the patient. We will schedule for cystoscopy retrogrades and TURBT. He will need cardiac clearance to come off his Coumadin. We will need to assess possibility of needing Lovenox bridge due to underlying cardiac issues.

## 2021-10-13 ENCOUNTER — Encounter (HOSPITAL_COMMUNITY)
Admission: RE | Disposition: A | Discharge status: Home / Self Care | Payer: Self-pay | Source: Home / Self Care | Attending: Urology

## 2021-10-13 ENCOUNTER — Ambulatory Visit (HOSPITAL_COMMUNITY): Payer: Medicare Other

## 2021-10-13 ENCOUNTER — Ambulatory Visit (HOSPITAL_COMMUNITY)
Admission: RE | Admit: 2021-10-13 | Discharge: 2021-10-13 | Disposition: A | Discharge status: Home / Self Care | Payer: Medicare Other | Attending: Urology | Admitting: Urology

## 2021-10-13 ENCOUNTER — Encounter (HOSPITAL_COMMUNITY): Payer: Self-pay | Admitting: Urology

## 2021-10-13 ENCOUNTER — Ambulatory Visit (HOSPITAL_BASED_OUTPATIENT_CLINIC_OR_DEPARTMENT_OTHER): Payer: Medicare Other | Admitting: Anesthesiology

## 2021-10-13 ENCOUNTER — Ambulatory Visit (HOSPITAL_COMMUNITY): Payer: Medicare Other | Admitting: Physician Assistant

## 2021-10-13 DIAGNOSIS — D494 Neoplasm of unspecified behavior of bladder: Secondary | ICD-10-CM

## 2021-10-13 DIAGNOSIS — Z950 Presence of cardiac pacemaker: Secondary | ICD-10-CM | POA: Diagnosis not present

## 2021-10-13 DIAGNOSIS — Z8673 Personal history of transient ischemic attack (TIA), and cerebral infarction without residual deficits: Secondary | ICD-10-CM | POA: Diagnosis not present

## 2021-10-13 DIAGNOSIS — C671 Malignant neoplasm of dome of bladder: Secondary | ICD-10-CM | POA: Diagnosis not present

## 2021-10-13 DIAGNOSIS — M1990 Unspecified osteoarthritis, unspecified site: Secondary | ICD-10-CM

## 2021-10-13 DIAGNOSIS — Z923 Personal history of irradiation: Secondary | ICD-10-CM | POA: Diagnosis not present

## 2021-10-13 DIAGNOSIS — I10 Essential (primary) hypertension: Secondary | ICD-10-CM

## 2021-10-13 DIAGNOSIS — R310 Gross hematuria: Secondary | ICD-10-CM | POA: Diagnosis not present

## 2021-10-13 DIAGNOSIS — Z79899 Other long term (current) drug therapy: Secondary | ICD-10-CM | POA: Insufficient documentation

## 2021-10-13 DIAGNOSIS — Z7901 Long term (current) use of anticoagulants: Secondary | ICD-10-CM | POA: Diagnosis not present

## 2021-10-13 DIAGNOSIS — Z8546 Personal history of malignant neoplasm of prostate: Secondary | ICD-10-CM | POA: Insufficient documentation

## 2021-10-13 DIAGNOSIS — I4891 Unspecified atrial fibrillation: Secondary | ICD-10-CM

## 2021-10-13 DIAGNOSIS — Z01818 Encounter for other preprocedural examination: Secondary | ICD-10-CM

## 2021-10-13 DIAGNOSIS — Z952 Presence of prosthetic heart valve: Secondary | ICD-10-CM | POA: Diagnosis not present

## 2021-10-13 DIAGNOSIS — C679 Malignant neoplasm of bladder, unspecified: Secondary | ICD-10-CM | POA: Diagnosis present

## 2021-10-13 HISTORY — PX: TRANSURETHRAL RESECTION OF BLADDER TUMOR: SHX2575

## 2021-10-13 HISTORY — PX: CYSTOSCOPY W/ RETROGRADES: SHX1426

## 2021-10-13 LAB — PROTIME-INR
INR: 1.1 (ref 0.8–1.2)
Prothrombin Time: 13.8 seconds (ref 11.4–15.2)

## 2021-10-13 LAB — APTT: aPTT: 33 seconds (ref 24–36)

## 2021-10-13 SURGERY — TURBT (TRANSURETHRAL RESECTION OF BLADDER TUMOR)
Anesthesia: General

## 2021-10-13 MED ORDER — LACTATED RINGERS IV SOLN: INTRAVENOUS | Status: DC

## 2021-10-13 MED ORDER — FENTANYL CITRATE (PF) 100 MCG/2ML IJ SOLN
INTRAMUSCULAR | Status: DC | PRN
  Administered 2021-10-13 (×2): 25 ug via INTRAVENOUS
  Administered 2021-10-13: 50 ug via INTRAVENOUS

## 2021-10-13 MED ORDER — LIDOCAINE HCL (PF) 2 % IJ SOLN
INTRAMUSCULAR | Status: AC
  Filled 2021-10-13: qty 5

## 2021-10-13 MED ORDER — PHENYLEPHRINE HCL-NACL 20-0.9 MG/250ML-% IV SOLN
INTRAVENOUS | Status: DC | PRN
  Administered 2021-10-13: 35 ug/min via INTRAVENOUS

## 2021-10-13 MED ORDER — SODIUM CHLORIDE 0.9 % IR SOLN
Status: DC | PRN
  Administered 2021-10-13: 3000 mL via INTRAVESICAL

## 2021-10-13 MED ORDER — ACETAMINOPHEN 500 MG PO TABS
1000.0000 mg | ORAL_TABLET | Freq: Once | ORAL | Status: AC
  Administered 2021-10-13: 1000 mg via ORAL
  Filled 2021-10-13: qty 2

## 2021-10-13 MED ORDER — LIDOCAINE 2% (20 MG/ML) 5 ML SYRINGE
INTRAMUSCULAR | Status: DC | PRN
  Administered 2021-10-13: 60 mg via INTRAVENOUS

## 2021-10-13 MED ORDER — PROPOFOL 10 MG/ML IV BOLUS
INTRAVENOUS | Status: AC
  Filled 2021-10-13: qty 20

## 2021-10-13 MED ORDER — FENTANYL CITRATE PF 50 MCG/ML IJ SOSY: 25.0000 ug | PREFILLED_SYRINGE | INTRAMUSCULAR | Status: DC | PRN

## 2021-10-13 MED ORDER — ONDANSETRON HCL 4 MG/2ML IJ SOLN
INTRAMUSCULAR | Status: AC
  Filled 2021-10-13: qty 2

## 2021-10-13 MED ORDER — ONDANSETRON HCL 4 MG/2ML IJ SOLN
INTRAMUSCULAR | Status: DC | PRN
Start: 2021-10-13 — End: 2021-10-13
  Administered 2021-10-13: 4 mg via INTRAVENOUS

## 2021-10-13 MED ORDER — CEFAZOLIN SODIUM-DEXTROSE 2-4 GM/100ML-% IV SOLN
2.0000 g | INTRAVENOUS | Status: AC
  Administered 2021-10-13: 2 g via INTRAVENOUS
  Filled 2021-10-13: qty 100

## 2021-10-13 MED ORDER — PROPOFOL 10 MG/ML IV BOLUS
INTRAVENOUS | Status: DC | PRN
  Administered 2021-10-13: 100 mg via INTRAVENOUS
  Administered 2021-10-13: 50 mg via INTRAVENOUS

## 2021-10-13 MED ORDER — STERILE WATER FOR IRRIGATION IR SOLN
Status: DC | PRN
  Administered 2021-10-13: 3000 mL

## 2021-10-13 MED ORDER — CHLORHEXIDINE GLUCONATE 0.12 % MT SOLN
15.0000 mL | Freq: Once | OROMUCOSAL | Status: AC
  Administered 2021-10-13: 15 mL via OROMUCOSAL

## 2021-10-13 MED ORDER — FENTANYL CITRATE (PF) 100 MCG/2ML IJ SOLN
INTRAMUSCULAR | Status: AC
  Filled 2021-10-13: qty 2

## 2021-10-13 MED ORDER — ORAL CARE MOUTH RINSE: 15.0000 mL | Freq: Once | OROMUCOSAL | Status: AC

## 2021-10-13 MED ORDER — PHENYLEPHRINE 40 MCG/ML (10ML) SYRINGE FOR IV PUSH (FOR BLOOD PRESSURE SUPPORT)
PREFILLED_SYRINGE | INTRAVENOUS | Status: DC | PRN
  Administered 2021-10-13: 200 ug via INTRAVENOUS

## 2021-10-13 MED ORDER — IOHEXOL 300 MG/ML  SOLN
INTRAMUSCULAR | Status: DC | PRN
  Administered 2021-10-13: 10 mL via URETHRAL

## 2021-10-13 SURGICAL SUPPLY — 24 items
BAG COUNTER SPONGE SURGICOUNT (BAG) IMPLANT
BAG DRN RND TRDRP ANRFLXCHMBR (UROLOGICAL SUPPLIES)
BAG SPNG CNTER NS LX DISP (BAG)
BAG URINE DRAIN 2000ML AR STRL (UROLOGICAL SUPPLIES) IMPLANT
BAG URO CATCHER STRL LF (MISCELLANEOUS) ×3 IMPLANT
CATH URET 5FR 28IN CONE TIP (BALLOONS) ×3
CATH URET 5FR 70CM CONE TIP (BALLOONS) IMPLANT
CATH URETL OPEN END 6FR 70 (CATHETERS) IMPLANT
CLOTH BEACON ORANGE TIMEOUT ST (SAFETY) ×3 IMPLANT
DRAPE FOOT SWITCH (DRAPES) ×3 IMPLANT
ELECT REM PT RETURN 15FT ADLT (MISCELLANEOUS) IMPLANT
GLOVE SURG ENC TEXT LTX SZ7.5 (GLOVE) ×3 IMPLANT
GOWN STRL REUS W/TWL LRG LVL3 (GOWN DISPOSABLE) ×3 IMPLANT
GOWN STRL REUS W/TWL XL LVL3 (GOWN DISPOSABLE) ×3 IMPLANT
GUIDEWIRE STR DUAL SENSOR (WIRE) ×3 IMPLANT
KIT TURNOVER KIT A (KITS) IMPLANT
LOOP CUT BIPOLAR 24F LRG (ELECTROSURGICAL) IMPLANT
MANIFOLD NEPTUNE II (INSTRUMENTS) ×3 IMPLANT
NS IRRIG 1000ML POUR BTL (IV SOLUTION) IMPLANT
PACK CYSTO (CUSTOM PROCEDURE TRAY) ×3 IMPLANT
SYR TOOMEY IRRIG 70ML (MISCELLANEOUS)
SYRINGE TOOMEY IRRIG 70ML (MISCELLANEOUS) IMPLANT
TUBING CONNECTING 10 (TUBING) ×3 IMPLANT
TUBING UROLOGY SET (TUBING) ×3 IMPLANT

## 2021-10-13 NOTE — Op Note (Signed)
Preoperative diagnosis:  1.  Recurrent transitional cell carcinoma the bladder  Postoperative diagnosis: 1.  Same  Procedure(s): 1.  Cystoscopy, bilateral retrograde pyelogram with intraoperative interpretation, transurethral section of bladder tumor (medium size)  Surgeon: Dr. Harold Barban  Anesthesia: General  Complications: None  EBL: Minimal  Specimens: Bladder tumor  Disposition of specimens: To pathology  Intraoperative findings: Patient had recurrent papillary tumor approxi-1 cm in size and then adjacent area of slightly raised papillary tissue approximately 1 to 2 cm in size.  Areas were all removed utilizing rigid biopsy forceps and then fulgurated.  Total surface area fulgurated was probably 2 to 3 cm in the dome of the bladder  Indication: 89 year old white male with history of superficial transitional cell carcinoma bladder.  Was found on surveillance cystoscopy to have recurrent disease in the dome with at least 1 exophytic tumor and a probably patchy area of papillary tumor adjacent to it.  Presents this time with go cystoscopy retrogrades and TURBT  Description of procedure:  After obtaining informed consent the patient was taken major cystoscopy suite placed under general anesthesia.  Placed in the dorsolithotomy position genitalia prepped and draped in usual sterile fashion.  Proper pause and timeout was performed for site of procedure.  21 [place] scope was advanced into the bladder and bladder inspected with 30 and 70 degree lenses.  Above-noted findings noted.  Ureteral orifice ease were in normal position refluxing clear urine.  Bilateral retrograde pyelograms were performed with an 8 French cone-tip catheter revealed normal filling of both upper tracts without evidence of filling defect or hydronephrosis.  Both upper tract emptied out promptly upon removal of the retrograde catheter.  Attention then directed towards removal of the bladder lesion.  In order to  preserve pathologic integrity was felt best to remove these areas with rigid biopsy forceps versus loop resection with cautery.  To that end the rigid biopsy forceps were utilized to remove the small exophytic lesion and then representative sample of the papillary area adjacent to this area.  Specimen sent to pathology.  Bugbee electrode was then utilized to cauterize the base of the prior lesion and cauterized the remaining frondular appearing tissue so that was completely cauterized and treated.  No remaining tumor noted.  There was 1 small erythematous area in the left floor the bladder which was also cauterized but I felt like this was just vascular in origin.  Bladder was emptied and the area reinspected with good hemostasis noted.  Procedure was terminated he was awakened from anesthesia and taken back to the recovery room in stable condition.  No immediate complication from the procedure.

## 2021-10-13 NOTE — Anesthesia Procedure Notes (Signed)
Procedure Name: LMA Insertion Date/Time: 10/13/2021 2:49 PM Performed by: Maxwell Caul, CRNA Pre-anesthesia Checklist: Patient identified, Emergency Drugs available, Suction available and Patient being monitored Patient Re-evaluated:Patient Re-evaluated prior to induction Oxygen Delivery Method: Circle system utilized Preoxygenation: Pre-oxygenation with 100% oxygen Induction Type: IV induction LMA: LMA with gastric port inserted LMA Size: 4.0 Number of attempts: 1 Placement Confirmation: positive ETCO2 and breath sounds checked- equal and bilateral Tube secured with: Tape Dental Injury: Teeth and Oropharynx as per pre-operative assessment

## 2021-10-13 NOTE — Transfer of Care (Signed)
Immediate Anesthesia Transfer of Care Note  Patient: Robert Anderson  Procedure(s) Performed: TRANSURETHRAL RESECTION OF BLADDER TUMOR (TURBT) CYSTOSCOPY WITH RETROGRADE PYELOGRAM (Bilateral)  Patient Location: PACU  Anesthesia Type:General  Level of Consciousness: sedated  Airway & Oxygen Therapy: Patient Spontanous Breathing and Patient connected to face mask oxygen  Post-op Assessment: Report given to RN and Post -op Vital signs reviewed and stable  Post vital signs: Reviewed and stable  Last Vitals:  Vitals Value Taken Time  BP 154/71 10/13/21 1534  Temp    Pulse 60 10/13/21 1536  Resp 15 10/13/21 1536  SpO2 98 % 10/13/21 1536  Vitals shown include unvalidated device data.  Last Pain:  Vitals:   10/13/21 1027  TempSrc:   PainSc: 0-No pain         Complications: No notable events documented.

## 2021-10-13 NOTE — Progress Notes (Signed)
Patient informed the OR is behind and he should go to surgery soon. Patient voiced understanding.

## 2021-10-13 NOTE — Interval H&P Note (Signed)
History and Physical Interval Note:  10/13/2021 10:30 AM  Robert Anderson  has presented today for surgery, with the diagnosis of BLADDER CANCER.  The various methods of treatment have been discussed with the patient and family. After consideration of risks, benefits and other options for treatment, the patient has consented to  Procedure(s) with comments: TRANSURETHRAL RESECTION OF BLADDER TUMOR (TURBT) (N/A) - 30 MINS CYSTOSCOPY WITH RETROGRADE PYELOGRAM (Bilateral) as a surgical intervention.  The patient's history has been reviewed, patient examined, no change in status, stable for surgery.  I have reviewed the patient's chart and labs.  Questions were answered to the patient's satisfaction.     Remi Haggard

## 2021-10-13 NOTE — Anesthesia Procedure Notes (Signed)
Date/Time: 10/13/2021 3:26 PM Performed by: Maxwell Caul, CRNA Oxygen Delivery Method: Simple face mask Placement Confirmation: positive ETCO2 and breath sounds checked- equal and bilateral Dental Injury: Teeth and Oropharynx as per pre-operative assessment

## 2021-10-14 ENCOUNTER — Telehealth: Payer: Self-pay

## 2021-10-14 ENCOUNTER — Encounter (HOSPITAL_COMMUNITY): Payer: Self-pay | Admitting: Urology

## 2021-10-14 LAB — SURGICAL PATHOLOGY

## 2021-10-14 NOTE — Anesthesia Postprocedure Evaluation (Signed)
Anesthesia Post Note ? ?Patient: Robert Anderson ? ?Procedure(s) Performed: TRANSURETHRAL RESECTION OF BLADDER TUMOR (TURBT) ?CYSTOSCOPY WITH RETROGRADE PYELOGRAM (Bilateral) ? ?  ? ?Patient location during evaluation: PACU ?Anesthesia Type: General ?Level of consciousness: awake and alert ?Pain management: pain level controlled ?Vital Signs Assessment: post-procedure vital signs reviewed and stable ?Respiratory status: spontaneous breathing, nonlabored ventilation, respiratory function stable and patient connected to nasal cannula oxygen ?Cardiovascular status: blood pressure returned to baseline and stable ?Postop Assessment: no apparent nausea or vomiting ?Anesthetic complications: no ? ? ?No notable events documented. ? ?Last Vitals:  ?Vitals:  ? 10/13/21 1615 10/13/21 1636  ?BP: (!) 156/74 (!) 159/79  ?Pulse: 60 63  ?Resp: 16 18  ?Temp:  36.4 ?C  ?SpO2: 97% 96%  ?  ?Last Pain:  ?Vitals:  ? 10/13/21 1636  ?TempSrc: Axillary  ?PainSc: 0-No pain  ? ? ?  ?  ?  ?  ?  ?  ? ?[name] ? ? ? ? ?

## 2021-10-14 NOTE — Telephone Encounter (Signed)
I followed up with pt's wife and reviewed instructions post procedure.  She verbalized understanding. ?

## 2021-10-19 ENCOUNTER — Ambulatory Visit (INDEPENDENT_AMBULATORY_CARE_PROVIDER_SITE_OTHER): Payer: Medicare Other

## 2021-10-19 ENCOUNTER — Other Ambulatory Visit: Payer: Self-pay

## 2021-10-19 DIAGNOSIS — Z952 Presence of prosthetic heart valve: Secondary | ICD-10-CM

## 2021-10-19 DIAGNOSIS — Z79899 Other long term (current) drug therapy: Secondary | ICD-10-CM

## 2021-10-19 DIAGNOSIS — Z5181 Encounter for therapeutic drug level monitoring: Secondary | ICD-10-CM

## 2021-10-19 LAB — POCT INR: INR: 1.2 — AB (ref 2.0–3.0)

## 2021-10-19 NOTE — Patient Instructions (Signed)
-   TAKE 2 TABLETS TONIGHT AND Tuesday ONLY and then continue 1 tablet daily except 1/2 tablet on Wednesday and Saturday;  Continue Lovenox until Friday;  ?- Repeat INR 4 days ? ?

## 2021-10-20 ENCOUNTER — Encounter (HOSPITAL_COMMUNITY): Payer: Self-pay | Admitting: Urology

## 2021-10-20 NOTE — Addendum Note (Signed)
Addendum  created 10/20/21 1309 by Freddrick March, MD  ? Intraprocedure Event edited, Intraprocedure Staff edited  ?  ?

## 2021-10-23 ENCOUNTER — Other Ambulatory Visit: Payer: Self-pay

## 2021-10-23 ENCOUNTER — Ambulatory Visit (INDEPENDENT_AMBULATORY_CARE_PROVIDER_SITE_OTHER): Payer: Medicare Other

## 2021-10-23 DIAGNOSIS — Z952 Presence of prosthetic heart valve: Secondary | ICD-10-CM

## 2021-10-23 DIAGNOSIS — Z79899 Other long term (current) drug therapy: Secondary | ICD-10-CM

## 2021-10-23 DIAGNOSIS — Z5181 Encounter for therapeutic drug level monitoring: Secondary | ICD-10-CM | POA: Diagnosis not present

## 2021-10-23 LAB — POCT INR: INR: 2.4 (ref 2.0–3.0)

## 2021-10-23 NOTE — Patient Instructions (Signed)
continue 1 tablet daily except 1/2 tablet on Wednesday and Saturday;  Stop Lovenox injections.  INR in 4 weeks ? ?

## 2021-10-29 ENCOUNTER — Telehealth: Payer: Self-pay | Admitting: Internal Medicine

## 2021-10-29 NOTE — Telephone Encounter (Addendum)
error 

## 2021-11-05 ENCOUNTER — Encounter: Payer: Medicare Other | Admitting: Adult Health

## 2021-11-11 ENCOUNTER — Ambulatory Visit (INDEPENDENT_AMBULATORY_CARE_PROVIDER_SITE_OTHER): Payer: Medicare Other | Admitting: Adult Health

## 2021-11-11 ENCOUNTER — Encounter: Payer: Self-pay | Admitting: Adult Health

## 2021-11-11 VITALS — BP 110/80 | HR 63 | Temp 97.6°F | Ht 69.0 in | Wt 224.0 lb

## 2021-11-11 DIAGNOSIS — I4891 Unspecified atrial fibrillation: Secondary | ICD-10-CM | POA: Diagnosis not present

## 2021-11-11 DIAGNOSIS — I10 Essential (primary) hypertension: Secondary | ICD-10-CM | POA: Diagnosis not present

## 2021-11-11 DIAGNOSIS — E785 Hyperlipidemia, unspecified: Secondary | ICD-10-CM | POA: Diagnosis not present

## 2021-11-11 DIAGNOSIS — Z952 Presence of prosthetic heart valve: Secondary | ICD-10-CM | POA: Diagnosis not present

## 2021-11-11 DIAGNOSIS — I442 Atrioventricular block, complete: Secondary | ICD-10-CM

## 2021-11-11 DIAGNOSIS — Z0000 Encounter for general adult medical examination without abnormal findings: Secondary | ICD-10-CM

## 2021-11-11 DIAGNOSIS — Z8551 Personal history of malignant neoplasm of bladder: Secondary | ICD-10-CM

## 2021-11-11 LAB — COMPREHENSIVE METABOLIC PANEL
ALT: 14 U/L (ref 0–53)
AST: 18 U/L (ref 0–37)
Albumin: 4.1 g/dL (ref 3.5–5.2)
Alkaline Phosphatase: 67 U/L (ref 39–117)
BUN: 18 mg/dL (ref 6–23)
CO2: 28 mEq/L (ref 19–32)
Calcium: 9.7 mg/dL (ref 8.4–10.5)
Chloride: 104 mEq/L (ref 96–112)
Creatinine, Ser: 0.86 mg/dL (ref 0.40–1.50)
GFR: 73.69 mL/min (ref >60.00)
Glucose, Bld: 106 mg/dL — ABNORMAL HIGH (ref 70–99)
Potassium: 4.5 mEq/L (ref 3.5–5.1)
Sodium: 139 mEq/L (ref 135–145)
Total Bilirubin: 0.6 mg/dL (ref 0.2–1.2)
Total Protein: 6.5 g/dL (ref 6.0–8.3)

## 2021-11-11 LAB — CBC WITH DIFFERENTIAL/PLATELET
Basophils Absolute: 0 10*3/uL (ref 0.0–0.1)
Basophils Relative: 0.4 % (ref 0.0–3.0)
Eosinophils Absolute: 0.1 10*3/uL (ref 0.0–0.7)
Eosinophils Relative: 1.7 % (ref 0.0–5.0)
HCT: 45.4 % (ref 39.0–52.0)
Hemoglobin: 14.9 g/dL (ref 13.0–17.0)
Lymphocytes Relative: 26.4 % (ref 12.0–46.0)
Lymphs Abs: 1.6 10*3/uL (ref 0.7–4.0)
MCHC: 32.8 g/dL (ref 30.0–36.0)
MCV: 89.4 fl (ref 78.0–100.0)
Monocytes Absolute: 0.5 10*3/uL (ref 0.1–1.0)
Monocytes Relative: 9 % (ref 3.0–12.0)
Neutro Abs: 3.8 10*3/uL (ref 1.4–7.7)
Neutrophils Relative %: 62.5 % (ref 43.0–77.0)
Platelets: 181 10*3/uL (ref 150.0–400.0)
RBC: 5.08 Mil/uL (ref 4.22–5.81)
RDW: 13.9 % (ref 11.5–15.5)
WBC: 6 10*3/uL (ref 4.0–10.5)

## 2021-11-11 LAB — LIPID PANEL
Cholesterol: 138 mg/dL (ref 0–200)
HDL: 43.6 mg/dL (ref >39.00)
LDL Cholesterol: 60 mg/dL (ref 0–99)
NonHDL: 93.91
Total CHOL/HDL Ratio: 3
Triglycerides: 169 mg/dL — ABNORMAL HIGH (ref 0.0–149.0)
VLDL: 33.8 mg/dL (ref 0.0–40.0)

## 2021-11-11 LAB — TSH: TSH: 3.03 u[IU]/mL (ref 0.35–5.50)

## 2021-11-11 NOTE — Progress Notes (Signed)
? ?Subjective:  ? ? Patient ID: Robert Anderson, male    DOB: 06/25/1924, 88 y.o.   MRN: [id number] ? ?HPI ?Patient presents for yearly preventative medicine examination. 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 ? ?Aortic valve disease-status post TAVR in 2016.  He is managed by cardiology.  Denies chest pain, shortness of breath, DOE, dizziness, or palpitations ? ?PAF-rate controlled with Toprol 25 mg daily and anticoagulation with Coumadin ? ?Complete heart block-has pacemaker.  Managed by cardiology ? ?Hypertension-controlled with Toprol 25 mg daily ?BP Readings from Last 3 Encounters:  ?11/11/21 110/80  ?10/13/21 (!) 159/79  ?10/08/21 (!) 143/78  ? ?Hyperlipidemia-takes Crestor 5 mg daily.  He denies myalgia dose ? ?History of bladder cancer-was diagnosed with bladder cancer in September 2021 and underwent cystoscopy followed by 6 weeks of chemotherapy which he completed without adverse effects.  He had a transurethral resection of the bladder tumor in January 2022 and repeat in February 2023 for recurrent of bladder cancer.  Currently prescribed Flomax and Myrbetriq. ? ?All immunizations and health maintenance protocols were reviewed with the patient and needed orders were placed. ? ?Appropriate screening laboratory values were  ordered for the patient including screening of hyperlipidemia, renal function and hepatic function. ? ?Medication reconciliation,  past medical history, social history, problem list and allergies were reviewed in detail with the patient ? ?Goals were established with regard to weight loss, exercise, and  diet in compliance with medications.  He continues to exercise by riding his stationary bike and doing sit ups at home. ?Wt Readings from Last 3 Encounters:  ?11/11/21 224 lb (101.6 kg)  ?10/13/21 229 lb 0.9 oz (103.9 kg)  ?10/08/21 229 lb (103.9 kg)  ? ?He denies any acute issues and states " I feel really good"  ? ?Review of Systems  ?Constitutional: Negative.   ?HENT: Negative.    ?Eyes: Negative.   ?Respiratory: Negative.    ?Cardiovascular: Negative.   ?Gastrointestinal: Negative.   ?Endocrine: Negative.   ?Genitourinary: Negative.   ?Musculoskeletal:  Positive for arthralgias and gait problem.  ?Skin: Negative.   ?Allergic/Immunologic: Negative.   ?Hematological: Negative.   ?Psychiatric/Behavioral: Negative.    ?All other systems reviewed and are negative. ? ?Past Medical History:  ?Diagnosis Date  ? Atrial fibrillation ([place])   ? a. dx after lead revision 06/26/15 >> anticoag not started due to recent pericardial effusion in setting of perforation and lead revision  ? Bladder cancer ([place])   ? chemo done 2021  ? Bradycardia   ? a. HR 30s in clinic 06/2015 - BB discontinued.  ? DEGENERATIVE JOINT DISEASE, SPINE   ? ERECTILE DYSFUNCTION   ? Essential hypertension   ? First degree AV block   ? Hearing deficit   ? wears hearing aides both ears  ? HEARING LOSS   ? Heart block   ? s/p Pacemaker  ? Hyperlipidemia   ? Hypertrophic obstructive cardiomyopathy ([place])   ? LBBB (left bundle branch block)   ?  Macular degeneration   ? left eye gets shots for  ? OA (osteoarthritis)   ? both hips  ? Pericardial effusion   ? a. micorpeforation s/p lead revision 06/26/15  ? Presence of permanent cardiac pacemaker 06/24/2015  ? St  Jude Assurity  ? Prostate cancer ([place]) 2010  ? "external radiation; 40 treatments"  ? S/P TAVR (transcatheter aortic valve replacement) 03/25/2015  ? 29 mm Edwards Sapien 3 transcatheter heart valve placed via open right transfemoral approach  ? Severe aortic stenosis   ? a. s/p TAVR 03/2015. Pre-op  LHC 02/2015: minor nonobstructive CAD.  ? Squamous cell skin cancer   ? left lower leg  ? Stroke ([place]) 02/19/2014  ? Small infarct high posterior left frontal lobe on MRI    ? TIA (transient ischemic attack) 01/21/2014  ? 5 min spell aphasia without assoc sx    ? Uses walker   ? Wears glasses   ? ? ?Social History  ? ?Socioeconomic History  ? Marital status: Married  ?  Spouse name: Not on file  ? Number of children: 4  ? Years of education: 60  ? Highest education level: Not on file  ?Occupational History  ? Occupation: Retired  ?Tobacco Use  ? Smoking status: Never  ? Smokeless tobacco: Never  ?Vaping Use  ? Vaping Use: Never used  ?Substance and Sexual Activity  ? Alcohol use: No  ?  Alcohol/week: 0.0 standard drinks  ? Drug use: No  ? Sexual activity: Not Currently  ?Other Topics Concern  ? Not on file  ?Social History Narrative  ? Patient lives in [place] with his wife. 2nd marriage   ? 4 children   ? He goes to the [facility] three days a week  ?   ? 09/27/18: Lives at [facility] with wife  ? Still goes to [facility], 2X/week, bicycles/uses sitting eliptical in room daily  ? Enjoys singing, part of singing group at center  ? Active in discussion groups at center  ?   ?   ? ?Social Determinants of Health  ? ?Financial Resource Strain: High Risk  ? Difficulty of Paying Living Expenses: Very hard  ?Food Insecurity: No Food Insecurity  ? Worried About Charity fundraiser in the Last Year: Never true  ? Ran Out of Food in the Last Year: Never true  ?Transportation Needs: No Transportation Needs  ? Lack of Transportation (Medical): No  ? Lack of Transportation (Non-Medical): No  ?Physical Activity: Insufficiently Active  ?  Days of Exercise per Week: 7 days  ? Minutes of Exercise per Session: 10 min  ?Stress: No Stress Concern Present  ? Feeling of Stress : Not at all  ?Social Connections: Socially Integrated  ? Frequency of Communication with Friends and Family: More than three times a week  ? Frequency of Social Gatherings with Friends and Family: More than three times a week  ? Attends Religious Services: More than 4 times per year  ? Active Member of Clubs or Organizations: Yes  ? Attends Archivist Meetings: More than 4 times per year  ? Marital Status: Married  ?Intimate Partner Violence: Not At Risk  ? Fear of Current or Ex-Partner: No  ? Emotionally Abused: No  ? Physically Abused: No  ? Sexually Abused: No  ? ? ?Past Surgical History:  ?Procedure Laterality Date  ? ANTERIOR CERVICAL DECOMP/DISCECTOMY FUSION  ~ 2000  ? Dr. Louanne Skye with bone graft  ? CARDIAC CATHETERIZATION N/A 02/20/2015  ?  Procedure: Right/Left Heart Cath and Coronary Angiography;  Surgeon: Sherren Mocha, MD;  Location: [facility];  Service: Cardiovascular;  Laterality: N/A;  ? CARDIAC VALVE REPLACEMENT    ? CATARACT EXTRACTION W/ INTRAOCULAR LENS  IMPLANT, BILATERAL  2004; 2010  ? right; left  ? CYSTOSCOPY W/ RETROGRADES Bilateral 04/22/2020  ? Procedure: CYSTOSCOPY WITH RETROGRADE PYELOGRAM/FULGERATION BLADDER BIOPSY;  Surgeon: Remi Haggard, MD;  Location: [facility];  Service: Urology;  Laterality: Bilateral;  ? CYSTOSCOPY W/ RETROGRADES Bilateral 09/09/2020  ? Procedure: CYSTOSCOPY WITH RETROGRADE PYELOGRAM;  Surgeon: Remi Haggard, MD;  Location: [facility];  Service: Urology;  Laterality: Bilateral;  ? CYSTOSCOPY W/ RETROGRADES Bilateral 10/13/2021  ? Procedure: CYSTOSCOPY WITH RETROGRADE PYELOGRAM;  Surgeon: Remi Haggard, MD;  Location: [facility];  Service: Urology;  Laterality: Bilateral;  ? EP IMPLANTABLE DEVICE N/A 06/24/2015  ? Procedure: Pacemaker Implant;  Surgeon: Evans Lance, MD;  Location: [facility];   Service: Cardiovascular; St Jude Assurity   ? EP IMPLANTABLE DEVICE N/A 06/26/2015  ? Procedure: Lead Revision/Repair;  Surgeon: Will Meredith Leeds, MD;  Location: [facility];  Service: Cardiovasc

## 2021-11-11 NOTE — Patient Instructions (Signed)
It was great seeing you today   We will follow up with you regarding your lab work   Please let me know if you need anything   

## 2021-11-19 ENCOUNTER — Ambulatory Visit (INDEPENDENT_AMBULATORY_CARE_PROVIDER_SITE_OTHER): Payer: Medicare Other

## 2021-11-19 DIAGNOSIS — Z5181 Encounter for therapeutic drug level monitoring: Secondary | ICD-10-CM | POA: Diagnosis not present

## 2021-11-19 DIAGNOSIS — Z952 Presence of prosthetic heart valve: Secondary | ICD-10-CM

## 2021-11-19 DIAGNOSIS — Z79899 Other long term (current) drug therapy: Secondary | ICD-10-CM

## 2021-11-19 LAB — POCT INR: INR: 2.4 (ref 2.0–3.0)

## 2021-11-19 NOTE — Patient Instructions (Signed)
continue 1 tablet daily except 1/2 tablet on Wednesday and Saturday;   INR in 6 weeks ?

## 2021-12-23 ENCOUNTER — Ambulatory Visit (INDEPENDENT_AMBULATORY_CARE_PROVIDER_SITE_OTHER): Payer: Medicare Other

## 2021-12-23 DIAGNOSIS — I442 Atrioventricular block, complete: Secondary | ICD-10-CM | POA: Diagnosis not present

## 2021-12-24 LAB — CUP PACEART REMOTE DEVICE CHECK
Battery Remaining Longevity: 32 mo
Battery Remaining Percentage: 30 %
Battery Voltage: 2.93 V
Brady Statistic AP VP Percent: 68 %
Brady Statistic AP VS Percent: 1 %
Brady Statistic AS VP Percent: 32 %
Brady Statistic AS VS Percent: 1 %
Brady Statistic RA Percent Paced: 65 %
Brady Statistic RV Percent Paced: 99 %
Date Time Interrogation Session: 20230509020014
Implantable Lead Implant Date: 20161108
Implantable Lead Implant Date: 20161108
Implantable Lead Location: 753859
Implantable Lead Location: 753860
Implantable Pulse Generator Implant Date: 20161108
Lead Channel Impedance Value: 410 Ohm
Lead Channel Impedance Value: 450 Ohm
Lead Channel Pacing Threshold Amplitude: 0.75 V
Lead Channel Pacing Threshold Amplitude: 0.75 V
Lead Channel Pacing Threshold Pulse Width: 0.5 ms
Lead Channel Pacing Threshold Pulse Width: 0.5 ms
Lead Channel Sensing Intrinsic Amplitude: 12 mV
Lead Channel Sensing Intrinsic Amplitude: 5 mV
Lead Channel Setting Pacing Amplitude: 1 V
Lead Channel Setting Pacing Amplitude: 2 V
Lead Channel Setting Pacing Pulse Width: 0.5 ms
Lead Channel Setting Sensing Sensitivity: 5 mV
Pulse Gen Model: 2240
Pulse Gen Serial Number: 7825693

## 2021-12-28 ENCOUNTER — Other Ambulatory Visit: Payer: Self-pay | Admitting: *Deleted

## 2021-12-28 ENCOUNTER — Other Ambulatory Visit: Payer: Self-pay | Admitting: Internal Medicine

## 2021-12-28 DIAGNOSIS — I10 Essential (primary) hypertension: Secondary | ICD-10-CM

## 2021-12-28 DIAGNOSIS — Z952 Presence of prosthetic heart valve: Secondary | ICD-10-CM

## 2021-12-28 MED ORDER — METOPROLOL SUCCINATE ER 25 MG PO TB24: 25.0000 mg | ORAL_TABLET | Freq: Every day | ORAL | 0 refills | Status: DC

## 2021-12-28 NOTE — Telephone Encounter (Signed)
Outpatient Medication Detail ? ? Disp Refills Start End   ?metoprolol succinate (TOPROL-XL) 25 MG 24 hr tablet 30 tablet 0 12/28/2021    ?Sig - Route: Take 1 tablet (25 mg total) by mouth daily. - Oral   ?Sent to pharmacy as: metoprolol succinate (TOPROL-XL) 25 MG 24 hr tablet   ?Notes to Pharmacy: 1ST ATTEMPT. PT IS OVERDUE TO SEE DR. Lovena Le. PLEASE HAVE PT CALL TO SCHEDULE   ?E-Prescribing Status: Receipt confirmed by pharmacy (12/28/2021  7:40 AM EDT)   ? ?Associated Diagnoses ? ?S/P TAVR (transcatheter aortic valve replacement)   ?  ?Essential hypertension   ?  ? ?Pharmacy ? ?[id number] - [address], [address] AT [address]  ? ?

## 2021-12-31 ENCOUNTER — Ambulatory Visit (INDEPENDENT_AMBULATORY_CARE_PROVIDER_SITE_OTHER): Payer: Medicare Other

## 2021-12-31 DIAGNOSIS — Z952 Presence of prosthetic heart valve: Secondary | ICD-10-CM

## 2021-12-31 DIAGNOSIS — Z79899 Other long term (current) drug therapy: Secondary | ICD-10-CM

## 2021-12-31 DIAGNOSIS — Z5181 Encounter for therapeutic drug level monitoring: Secondary | ICD-10-CM

## 2021-12-31 LAB — POCT INR: INR: 2.4 (ref 2.0–3.0)

## 2021-12-31 NOTE — Patient Instructions (Signed)
continue 1 tablet daily except 1/2 tablet on Wednesday and Saturday;   INR in 8 weeks

## 2022-01-01 NOTE — Progress Notes (Signed)
Remote pacemaker transmission.   

## 2022-02-08 ENCOUNTER — Other Ambulatory Visit: Payer: Self-pay

## 2022-02-24 ENCOUNTER — Other Ambulatory Visit: Payer: Self-pay | Admitting: Cardiology

## 2022-02-24 DIAGNOSIS — I4891 Unspecified atrial fibrillation: Secondary | ICD-10-CM

## 2022-02-24 MED ORDER — WARFARIN SODIUM 3 MG PO TABS: ORAL_TABLET | ORAL | 1 refills | Status: DC

## 2022-02-24 NOTE — Telephone Encounter (Signed)
Prescription refill request received for warfarin Lov: 09/01/21 ([place]) Next INR check: 02/25/22 Warfarin tablet strength: '3mg'$   Appropriate dose and refill sent to requested pharmacy.

## 2022-02-24 NOTE — Telephone Encounter (Signed)
*  STAT* If patient is at the pharmacy, call can be transferred to refill team.   1. Which medications need to be refilled? (please list name of each medication and dose if known) warfarin (COUMADIN) 3 MG tablet  2. Which pharmacy/location (including street and city if local pharmacy) is medication to be sent to? [facility], [place] - [address] AT [place]  3. Do they need a 30 day or 90 day supply? [place]

## 2022-02-25 ENCOUNTER — Ambulatory Visit (INDEPENDENT_AMBULATORY_CARE_PROVIDER_SITE_OTHER): Payer: Medicare Other | Admitting: *Deleted

## 2022-02-25 DIAGNOSIS — Z79899 Other long term (current) drug therapy: Secondary | ICD-10-CM

## 2022-02-25 DIAGNOSIS — Z5181 Encounter for therapeutic drug level monitoring: Secondary | ICD-10-CM

## 2022-02-25 LAB — POCT INR: INR: 4.5 — AB (ref 2.0–3.0)

## 2022-02-25 NOTE — Patient Instructions (Addendum)
Description    Hold warfarin today and tomorrow, then continue 1 tablet daily except 1/2 tablet on Wednesday and Saturday;INR in 1 week. [facility] [phone].

## 2022-02-28 NOTE — Progress Notes (Signed)
Cardiology Office Note   Date:  03/05/2022   ID:  Robert Anderson, DOB 05-15-1924, MRN [id number]  PCP:  Shirline Frees, NP  Cardiologist:   Shatha Hooser [place], MD   Chief Complaint  Patient presents with   Aortic Stenosis      History of Present Illness: Robert Anderson is a 88 y.o. male who presents for follow up heart block and prior TAVR. Previously followed by Dr Delton See. 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 He has recurrent bladder CA and is s/p chemotherapy and resection. Echo in January 2022 showed marked basal septal hypertrophy without outflow gradient. Normal EF. Normal functioning AV prosthesis. Pacemaker check 12/23/20 was normal. Followed by Dr Ladona Ridgel.   He is living at [facility]. Retired from [employer]. Originally from [place]. Has 4 children by his first marriage. Married again 38 years ago. Generally feels very well. Rides his stationary bike 10 minutes in the morning and 5 in the evening every day. Uses a walker. Denies any chest pain, dyspnea, palpitations or dizziness. Feels like he is doing well.   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 Had a lot of bleeding from the bladder prior to this but none since. He is scheduled for right eye surgery soon and it was requested that he hold Coumadin 3 days prior. He is feeling very well. No chest pain, dyspnea or palpitations. Denies any dizziness or lightheadedness.   Past Medical History:  Diagnosis Date   Atrial fibrillation [facility])    a. dx after lead revision 06/26/15 >> anticoag not started due to recent pericardial effusion in setting of perforation and lead revision   Bladder cancer (HCC)    chemo done 2021   Bradycardia    a. HR 30s in clinic 06/2015 -  BB discontinued.   DEGENERATIVE JOINT DISEASE, SPINE    ERECTILE DYSFUNCTION    Essential hypertension    First degree AV block    Hearing deficit    wears hearing aides both ears   HEARING LOSS    Heart block    s/p Pacemaker   Hyperlipidemia    Hypertrophic obstructive cardiomyopathy (HCC)    LBBB (left bundle branch block)    Macular degeneration    left eye gets shots for   OA (osteoarthritis)    both hips   Pericardial effusion    a. micorpeforation s/p lead revision 06/26/15   Presence of permanent cardiac pacemaker 06/24/2015   St Jude Assurity   Prostate cancer [facility]) 2010   "external radiation; 40 treatments"   S/P TAVR (transcatheter aortic valve replacement) 03/25/2015   29 mm Edwards Sapien 3 transcatheter heart valve placed via open right transfemoral approach   Severe aortic stenosis    a. s/p TAVR 03/2015. Pre-op  LHC 02/2015: minor nonobstructive CAD.   Squamous cell skin cancer    left lower leg   Stroke (HCC) 02/19/2014   Small infarct high posterior left frontal lobe on MRI     TIA (transient ischemic attack) 01/21/2014   5 min spell aphasia without assoc sx     Uses walker    Wears glasses     Past Surgical History:  Procedure Laterality Date   ANTERIOR CERVICAL DECOMP/DISCECTOMY FUSION  ~  2000   Dr. Otelia Sergeant with bone graft   CARDIAC CATHETERIZATION N/A 02/20/2015   Procedure: Right/Left Heart Cath and Coronary Angiography;  Surgeon: Tonny Bollman, MD;  Location: [facility] INVASIVE CV LAB;  Service: Cardiovascular;  Laterality: N/A;   CARDIAC VALVE REPLACEMENT     CATARACT EXTRACTION W/ INTRAOCULAR LENS  IMPLANT, BILATERAL  2004; 2010   right; left   CYSTOSCOPY W/ RETROGRADES Bilateral 04/22/2020   Procedure: CYSTOSCOPY WITH RETROGRADE PYELOGRAM/FULGERATION BLADDER BIOPSY;  Surgeon: Belva Agee, MD;  Location: [facility] ORS;  Service: Urology;  Laterality: Bilateral;   CYSTOSCOPY W/ RETROGRADES Bilateral 09/09/2020   Procedure: CYSTOSCOPY WITH RETROGRADE PYELOGRAM;   Surgeon: Belva Agee, MD;  Location: [facility];  Service: Urology;  Laterality: Bilateral;   CYSTOSCOPY W/ RETROGRADES Bilateral 10/13/2021   Procedure: CYSTOSCOPY WITH RETROGRADE PYELOGRAM;  Surgeon: Belva Agee, MD;  Location: [facility] ORS;  Service: Urology;  Laterality: Bilateral;   EP IMPLANTABLE DEVICE N/A 06/24/2015   Procedure: Pacemaker Implant;  Surgeon: Marinus Maw, MD;  Location: [facility] INVASIVE CV LAB;  Service: Cardiovascular; St Jude Assurity    EP IMPLANTABLE DEVICE N/A 06/26/2015   Procedure: Lead Revision/Repair;  Surgeon: Will Jorja Loa, MD;  Location: [facility] INVASIVE CV LAB;  Service: Cardiovascular;  Laterality: N/A;   INSERT / REPLACE / REMOVE PACEMAKER  06/24/2015   JOINT REPLACEMENT     SQUAMOUS CELL CARCINOMA EXCISION Left X 3   "lower leg"   TEE WITHOUT CARDIOVERSION N/A 03/07/2015   Procedure: TRANSESOPHAGEAL ECHOCARDIOGRAM (TEE);  Surgeon: Lars Masson, MD;  Location: [facility] ENDOSCOPY;  Service: Cardiovascular;  Laterality: N/A;   TEE WITHOUT CARDIOVERSION N/A 03/25/2015   Procedure: TRANSESOPHAGEAL ECHOCARDIOGRAM (TEE);  Surgeon: Kathleene Hazel, MD;  Location: [facility] OR;  Service: Open Heart Surgery;  Laterality: N/A;   TONSILLECTOMY  as child   TOTAL SHOULDER ARTHROPLASTY Right 2007   "wore it out playing tennis"   TRANSCATHETER AORTIC VALVE REPLACEMENT, TRANSFEMORAL N/A 03/25/2015   Procedure: TRANSCATHETER AORTIC VALVE REPLACEMENT, TRANSFEMORAL;  Surgeon: Kathleene Hazel, MD;  Location: [facility] OR;  Service: Open Heart Surgery;  Laterality: N/A;   TRANSURETHRAL RESECTION OF BLADDER TUMOR N/A 09/09/2020   Procedure: TRANSURETHRAL RESECTION OF BLADDER TUMOR (TURBT);  Surgeon: Belva Agee, MD;  Location: [facility];  Service: Urology;  Laterality: N/A;  30 MINS   TRANSURETHRAL RESECTION OF BLADDER TUMOR N/A 10/13/2021   Procedure: TRANSURETHRAL RESECTION OF BLADDER TUMOR (TURBT);  Surgeon: Belva Agee, MD;  Location:  [facility] ORS;  Service: Urology;  Laterality: N/A;  30 MINS     Current Outpatient Medications  Medication Sig Dispense Refill   mirabegron ER (MYRBETRIQ) 50 MG TB24 tablet Take 50 mg by mouth every other day. At lunch     Multiple Vitamins-Minerals (PRESERVISION AREDS 2+MULTI VIT PO) Take 1 tablet by mouth 2 (two) times daily.     Omega-3 Fatty Acids (FISH OIL) 1000 MG CAPS Take 1,000 mg by mouth 2 (two) times daily.     polyethylene glycol powder (MIRALAX) 17 GM/SCOOP powder Take 1 Container by mouth as needed.     PREVIDENT 5000 SENSITIVE 1.1-5 % PSTE Apply 1 application topically at bedtime.      rosuvastatin (CRESTOR) 5 MG tablet Take 1 tablet (5 mg total) by mouth daily. (Patient taking differently: Take 5 mg by mouth every evening.) 90 tablet 1   tamsulosin (FLOMAX) 0.4 MG CAPS capsule Take 0.4 mg by mouth daily after supper.  warfarin (COUMADIN) 3 MG tablet TAKE 1/2 TO 1 TABLET DAILY OR AS DIRECTED BY COUMADIN CLINIC 95 tablet 1   amoxicillin (AMOXIL) 500 MG capsule Take 2,000 mg by mouth See admin instructions. Take 2000 mg 1 hour prior to dental work (Patient not taking: Reported on 03/05/2022)     No current facility-administered medications for this visit.    Allergies:   Patient has no known allergies.    Social History:  The patient  reports that he has never smoked. He has never used smokeless tobacco. He reports that he does not drink alcohol and does not use drugs.   Family History:  The patient's family history includes Brain cancer in his sister; CVA in his father and mother; Cancer in his father; Heart attack in his brother; Heart disease in his brother and mother; Lung cancer in his brother; Prostate cancer in his brother; Stroke in his mother.    ROS:  Please see the history of present illness.   Otherwise, review of systems are positive for none.   All other systems are reviewed and negative.    PHYSICAL EXAM: VS:  BP (!) 90/56 (BP Location: Right Arm, Cuff Size:  Normal)   Pulse 62   Ht 5\' 9"  (1.753 m)   Wt 225 lb 6.4 oz (102.2 kg)   SpO2 91%   BMI 33.29 kg/m  , BMI Body mass index is 33.29 kg/m. GEN: Well nourished, well developed, in no acute distress HEENT: normal Neck: no JVD, carotid bruits, or masses Cardiac: RRR; no murmurs, rubs, or gallops,no edema  Respiratory:  clear to auscultation bilaterally, normal work of breathing GI: soft, nontender, nondistended, + BS MS: no deformity or atrophy Skin: warm and dry, no rash Neuro:  Strength and sensation are intact Psych: euthymic mood, full affect   EKG:  EKG is not ordered today.     Recent Labs: 11/11/2021: ALT 14; BUN 18; Creatinine, Ser 0.86; Hemoglobin 14.9; Platelets 181.0; Potassium 4.5; Sodium 139; TSH 3.03    Lipid Panel    Component Value Date/Time   CHOL 138 11/11/2021 1007   TRIG 169.0 (H) 11/11/2021 1007   HDL 43.60 11/11/2021 1007   CHOLHDL 3 11/11/2021 1007   VLDL 33.8 11/11/2021 1007   LDLCALC 60 11/11/2021 1007   LDLDIRECT 69.0 11/04/2020 1101      Wt Readings from Last 3 Encounters:  03/05/22 225 lb 6.4 oz (102.2 kg)  11/11/21 224 lb (101.6 kg)  10/13/21 229 lb 0.9 oz (103.9 kg)      Other studies Reviewed: Additional studies/ records that were reviewed today include:   Echo 08/26/20: IMPRESSIONS     1. Severe asymmetric septal hypertrophy up to 22 mm consistent with  history of hypertrophic cardiomyopathy. No LVOT obstruction or SAM of the  mitral valve. Left ventricular ejection fraction, by estimation, is 65 to  70%. The left ventricle has normal  function. The left ventricle has no regional wall motion abnormalities.  There is severe asymmetric left ventricular hypertrophy of the septal  segment. Left ventricular diastolic parameters are consistent with Grade  III diastolic dysfunction  (restrictive).   2. Right ventricular systolic function is normal. The right ventricular  size is normal. Tricuspid regurgitation signal is inadequate for  assessing  PA pressure.   3. Left atrial size was mildly dilated.   4. The mitral valve is grossly normal. Mild mitral valve regurgitation.  No evidence of mitral stenosis.   5. 29 mm S3 in aortic position. Vmax  2.0 m/s, MG 8.0 mmHG, EOA 3.22 cm2,  DI 0.71. No paravalvular leak. The aortic valve has been  repaired/replaced. Aortic valve regurgitation is not visualized. Procedure  Date: 03/25/2015. Echo findings are consistent  with normal structure and function of the aortic valve prosthesis.   6. The inferior vena cava is normal in size with greater than 50%  respiratory variability, suggesting right atrial pressure of 3 mmHg.   Comparison(s): No significant change from prior study.    ASSESSMENT AND PLAN:  1.  Status post TAVR 2016 last echo Jan 2022 functioning normally. He is asymptomatic. Aware of SBE prophylaxis.    2. Paroxysmal atrial fibrillation on Coumadin.  no recent episodes of palpitations, he is compliant with his warfarin. OK to hold for upcoming eye surgery.   3. Hypertrophic nonobstructive cardiomyopathy    4. Essential hypertension - BP is low today. Recommend stopping Toprol XL. In review of his  pacer checks he doesn't need this for rate control    5. Permanent pacemaker followed by Dr. Ladona Ridgel - placed for high degree AV block following TAVR   6. Bladder cancer -status post chemo and resection.    7. Hyperlipidemia, on low-dose of rosuvastatin 5 mg daily. LDL at goal 69. Labs followed by PCP   Plan: stop Toprol XL     Disposition:   FU with me in 6 months  Signed, Chaunce Winkels [place], MD  03/05/2022 4:06 PM    [facility] [address], [place], [place], [address] Phone [phone], Fax [phone]

## 2022-03-05 ENCOUNTER — Ambulatory Visit (INDEPENDENT_AMBULATORY_CARE_PROVIDER_SITE_OTHER): Payer: Medicare Other

## 2022-03-05 ENCOUNTER — Telehealth: Payer: Self-pay

## 2022-03-05 ENCOUNTER — Encounter: Payer: Self-pay | Admitting: Cardiology

## 2022-03-05 ENCOUNTER — Ambulatory Visit (INDEPENDENT_AMBULATORY_CARE_PROVIDER_SITE_OTHER): Payer: Medicare Other | Admitting: Cardiology

## 2022-03-05 VITALS — BP 90/56 | HR 62 | Ht 69.0 in | Wt 225.4 lb

## 2022-03-05 DIAGNOSIS — I447 Left bundle-branch block, unspecified: Secondary | ICD-10-CM

## 2022-03-05 DIAGNOSIS — I442 Atrioventricular block, complete: Secondary | ICD-10-CM | POA: Diagnosis not present

## 2022-03-05 DIAGNOSIS — Z952 Presence of prosthetic heart valve: Secondary | ICD-10-CM | POA: Diagnosis not present

## 2022-03-05 DIAGNOSIS — Z950 Presence of cardiac pacemaker: Secondary | ICD-10-CM

## 2022-03-05 DIAGNOSIS — I4891 Unspecified atrial fibrillation: Secondary | ICD-10-CM | POA: Diagnosis not present

## 2022-03-05 DIAGNOSIS — Z5181 Encounter for therapeutic drug level monitoring: Secondary | ICD-10-CM

## 2022-03-05 DIAGNOSIS — Z79899 Other long term (current) drug therapy: Secondary | ICD-10-CM

## 2022-03-05 LAB — POCT INR: INR: 3.9 — AB (ref 2.0–3.0)

## 2022-03-05 NOTE — Telephone Encounter (Signed)
   Pre-operative Risk Assessment    Patient Name: Robert Anderson  DOB: 08/30/1923 MRN: [id number]      Request for Surgical Clearance    Procedure:   Vitrectomy   Date of Surgery:  Clearance 03/17/22                                 Surgeon:  Dr. Ernst Breach  Surgeon's Group or Practice Name:  [facility] [place] Phone number:  [phone] Fax number:  [phone]   Type of Clearance Requested:   - Medical  - Pharmacy:  Hold Warfarin (Coumadin) 3 days prior    Type of Anesthesia:  MAC   Additional requests/questions:    Tana Conch   03/05/2022, 3:53 PM

## 2022-03-05 NOTE — Patient Instructions (Addendum)
Stop taking Metoprolol  Ok to hold Coumadin 3 days before eye surgery   Schedule follow up appointment in 6 months   Call in Sept to schedule Jan appointment

## 2022-03-05 NOTE — Patient Instructions (Signed)
Hold warfarin today then decrease to 1 tablet daily except 1/2 tablet on Monday, Wednesday and Saturday;INR in 2 weeks. Coumadin Clinic [phone].

## 2022-03-08 NOTE — Telephone Encounter (Signed)
Patient with diagnosis of afib and stroke on warfarin for anticoagulation.    Procedure:  Vitrectomy  Date of procedure: 03/17/22   CHA2DS2-VASc Score = 6   This indicates a 9.7% annual risk of stroke. The patient's score is based upon: CHF History: 0 HTN History: 1 Diabetes History: 0 Stroke History: 2 Vascular Disease History: 1 Age Score: 2 Gender Score: 0      CrCl 60 ml/min Platelet count 181  Patient historically is bridged with lovenox when off of warfarin due to stroke history. However, request is to hold warfarin for 3 days not 5. Will check with Dr. Martinique if he would like patient to hold 3 days with or without bridge.   **This guidance is not considered finalized until pre-operative APP has relayed final recommendations.**

## 2022-03-08 NOTE — Telephone Encounter (Addendum)
   Patient Name: Robert Anderson  DOB: 07/02/1924 MRN: [id number]  Primary Cardiologist: Peter Martinique, MD  Chart reviewed as part of pre-operative protocol coverage. Given past medical history and time since last visit, based on ACC/AHA guidelines, [name] would be at acceptable risk for the planned procedure without further cardiovascular testing.   Patient with diagnosis of afib and stroke on warfarin for anticoagulation.     Procedure:  Vitrectomy  Date of procedure: 03/17/22     CHA2DS2-VASc Score = 6   This indicates a 9.7% annual risk of stroke. The patient's score is based upon: CHF History: 0 HTN History: 1 Diabetes History: 0 Stroke History: 2 Vascular Disease History: 1 Age Score: 2 Gender Score: 0       CrCl 60 ml/min Platelet count 181   Patient historically is bridged with lovenox when off of warfarin due to stroke history. However, request is to hold warfarin for 3 days not 5. Per Dr. Martinique, primary cardiologist, ok to hold 3 days without Lovenox bridge.   I will route this recommendation as well as Dr. Doug Sou note from office visit on 03/05/2022 to the requesting party via [place] fax function and remove from pre-op pool.  Please call with questions.  Lenna Sciara, NP 03/08/2022, 4:25 PM

## 2022-03-12 NOTE — Progress Notes (Unsigned)
Electrophysiology Office Note Date: 03/16/2022  ID:  Robert Anderson, DOB 1924/05/12, MRN [id number]  PCP: Dorothyann Peng, NP Primary Cardiologist: Peter Martinique, MD Electrophysiologist: Cristopher Peru, MD   CC: Pacemaker follow-up  Robert Anderson is a 88 y.o. male seen today for Cristopher Peru, MD for routine electrophysiology followup.  Since last being seen in our clinic the patient reports doing very well from a cardiac perspective. He is undergoing ophthalmic surgery tomorrow for floaters in his R eye. He also has macular degeneration in his left eye. His coumadin is on hold.  he denies chest pain, palpitations, dyspnea, PND, orthopnea, nausea, vomiting, dizziness, syncope, edema, weight gain, or early satiety.  Device History: St. Jude Dual Chamber PPM implanted 06/2015 for CHB  Past Medical History:  Diagnosis Date   Atrial fibrillation ([place])    a. dx after lead revision 06/26/15 >> anticoag not started due to recent pericardial effusion in setting of perforation and lead revision   Bladder cancer ([place])    chemo done 2021   Bradycardia    a. HR 30s in clinic 06/2015 - BB discontinued.   DEGENERATIVE JOINT DISEASE, SPINE    ERECTILE DYSFUNCTION    Essential hypertension    First degree AV block    Hearing deficit    wears hearing aides both ears   HEARING LOSS    Heart block    s/p Pacemaker   Hyperlipidemia    Hypertrophic obstructive cardiomyopathy (HCC)    LBBB (left bundle branch block)    Macular degeneration    left eye gets shots for   OA (osteoarthritis)    both hips   Pericardial effusion    a. 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 s/p TAVR  03/2015. Pre-op  LHC 02/2015: minor nonobstructive CAD.   Squamous cell skin cancer    left lower leg   Stroke ([place]) 02/19/2014   Small infarct high posterior left frontal lobe on MRI     TIA (transient ischemic attack) 01/21/2014   5 min spell aphasia without assoc sx     Uses walker    Wears glasses    Past Surgical History:  Procedure Laterality Date   ANTERIOR CERVICAL DECOMP/DISCECTOMY FUSION  ~ 2000   Dr. Louanne Skye with bone graft   CARDIAC CATHETERIZATION N/A 02/20/2015   Procedure: Right/Left Heart Cath and Coronary Angiography;  Surgeon: Sherren Mocha, MD;  Location: [place] [facility];  Service: Cardiovascular;  Laterality: N/A;   CARDIAC VALVE REPLACEMENT     CATARACT EXTRACTION W/ INTRAOCULAR LENS  IMPLANT, BILATERAL  2004; 2010   right; left   CYSTOSCOPY W/ RETROGRADES Bilateral 04/22/2020   Procedure: CYSTOSCOPY WITH RETROGRADE PYELOGRAM/FULGERATION BLADDER BIOPSY;  Surgeon: Remi Haggard, MD;  [facility];  Service: Urology;  Laterality: Bilateral;   CYSTOSCOPY W/ RETROGRADES Bilateral 09/09/2020   Procedure: CYSTOSCOPY WITH RETROGRADE PYELOGRAM;  Surgeon: Remi Haggard, MD;  Location: [facility];  Service: Urology;  Laterality: Bilateral;   CYSTOSCOPY W/ RETROGRADES Bilateral 10/13/2021   Procedure: CYSTOSCOPY WITH RETROGRADE PYELOGRAM;  Surgeon: Remi Haggard, MD;  [facility];  Service: Urology;  Laterality: Bilateral;  EP IMPLANTABLE DEVICE N/A 06/24/2015   Procedure: Pacemaker Implant;  Surgeon: Evans Lance, MD;  Location: [place] [facility];  Service: Cardiovascular; St Jude Assurity    EP IMPLANTABLE DEVICE N/A 06/26/2015   Procedure: Lead Revision/Repair;  Surgeon: Will Meredith Leeds, MD;  Location: [place] [facility];  Service: Cardiovascular;  Laterality: N/A;   INSERT / REPLACE / REMOVE PACEMAKER  06/24/2015   JOINT REPLACEMENT     SQUAMOUS CELL CARCINOMA EXCISION Left X 3   "lower leg"   TEE WITHOUT CARDIOVERSION N/A 03/07/2015    Procedure: TRANSESOPHAGEAL ECHOCARDIOGRAM (TEE);  Surgeon: Dorothy Spark, MD;  Location: [place];  Service: Cardiovascular;  Laterality: N/A;   TEE WITHOUT CARDIOVERSION N/A 03/25/2015   Procedure: TRANSESOPHAGEAL ECHOCARDIOGRAM (TEE);  Surgeon: Burnell Blanks, MD;  Location: [place];  Service: Open Heart Surgery;  Laterality: N/A;   TONSILLECTOMY  as child   TOTAL SHOULDER ARTHROPLASTY Right 2007   "wore it out playing tennis"   TRANSCATHETER AORTIC VALVE REPLACEMENT, TRANSFEMORAL N/A 03/25/2015   Procedure: TRANSCATHETER AORTIC VALVE REPLACEMENT, TRANSFEMORAL;  Surgeon: Burnell Blanks, MD;  Location: [place];  Service: Open Heart Surgery;  Laterality: N/A;   TRANSURETHRAL RESECTION OF BLADDER TUMOR N/A 09/09/2020   Procedure: TRANSURETHRAL RESECTION OF BLADDER TUMOR (TURBT);  Surgeon: Remi Haggard, MD;  Location: [facility];  Service: Urology;  Laterality: N/A;  [place] TUMOR N/A 10/13/2021   Procedure: TRANSURETHRAL RESECTION OF BLADDER TUMOR (TURBT);  Surgeon: Remi Haggard, MD;  [facility];  Service: Urology;  Laterality: N/A;  30 MINS    Current Outpatient Medications  Medication Sig Dispense Refill   amoxicillin (AMOXIL) 500 MG capsule Take 2,000 mg by mouth See admin instructions. Take 2000 mg 1 hour prior to dental work     mirabegron ER (MYRBETRIQ) 50 MG TB24 tablet Take 50 mg by mouth every other day. At lunch     Multiple Vitamins-Minerals (PRESERVISION AREDS 2+MULTI VIT PO) Take 1 tablet by mouth 2 (two) times daily.     Omega-3 Fatty Acids (FISH OIL) 1000 MG CAPS Take 1,000 mg by mouth 2 (two) times daily.     polyethylene glycol powder (MIRALAX) 17 GM/SCOOP powder Take 1 Container by mouth as needed.     PREVIDENT 5000 SENSITIVE 1.1-5 % PSTE Apply 1 application topically at bedtime.      rosuvastatin (CRESTOR) 5 MG tablet Take 1 tablet (5 mg total) by mouth daily. (Patient taking differently: Take  5 mg by mouth every evening.) 90 tablet 1   tamsulosin (FLOMAX) 0.4 MG CAPS capsule Take 0.4 mg by mouth daily after supper.      warfarin (COUMADIN) 3 MG tablet TAKE 1/2 TO 1 TABLET DAILY OR AS DIRECTED BY COUMADIN CLINIC 95 tablet 1   No current facility-administered medications for this visit.    Allergies:   Patient has no known allergies.   Social History: Social History   Socioeconomic History   Marital status: Married    Spouse name: Not on file   Number of children: 4   Years of education: 16   Highest education level: Not on file  Occupational History   Occupation: Retired  Tobacco Use   Smoking status: Never   Smokeless tobacco: Never  Vaping Use   Vaping Use: Never used  Substance and Sexual Activity   Alcohol use: No    Alcohol/week: 0.0 standard drinks of alcohol   Drug use:  No   Sexual activity: Not Currently  Other Topics Concern   Not on file  Social History Narrative   Patient lives in [place] with his wife. 2nd marriage    4 children    He goes to the [facility] three days a week      09/27/18: Lives at [facility] with wife   Still goes to [facility], 2X/week, bicycles/uses sitting eliptical in room daily   Enjoys singing, part of singing group at center   Active in discussion groups at center         Social Determinants of Health   Financial Resource Strain: High Risk (08/14/2021)   Overall Financial Resource Strain (CARDIA)    Difficulty of Paying Living Expenses: Very hard  Food Insecurity: No Food Insecurity (08/14/2021)   Hunger Vital Sign    Worried About Running Out of Food in the Last Year: Never true    [place] in the Last Year: Never true  Transportation Needs: No Transportation Needs (08/14/2021)   PRAPARE - Hydrologist (Medical): No    Lack of Transportation (Non-Medical): No  Physical Activity: Insufficiently Active (08/14/2021)   Exercise Vital Sign    Days of Exercise per Week: 7 days     Minutes of Exercise per Session: 10 min  Stress: No Stress Concern Present (08/14/2021)   [place]    Feeling of Stress : Not at all  Social Connections: [place] (08/14/2021)   Social Connection and Isolation Panel [NHANES]    Frequency of Communication with Friends and Family: More than three times a week    Frequency of Social Gatherings with Friends and Family: More than three times a week    Attends Religious Services: More than 4 times per year    Active Member of [employer] or Organizations: Yes    Attends Music therapist: More than 4 times per year    Marital Status: Married  Human resources officer Violence: Not At Risk (08/14/2021)   Humiliation, Afraid, Rape, and Kick questionnaire    Fear of Current or Ex-Partner: No    Emotionally Abused: No    Physically Abused: No    Sexually Abused: No    Family History: Family History  Problem Relation Age of Onset   Heart disease Mother    CVA Mother    Stroke Mother    CVA Father    Cancer Father    Brain cancer Sister    Prostate cancer Brother    Heart disease Brother    Lung cancer Brother    Heart attack Brother    Hypertension Neg Hx      Review of Systems: All other systems reviewed and are otherwise negative except as noted above.  Physical Exam: Vitals:   03/16/22 0958  BP: 106/60  Pulse: 62  SpO2: 96%  Weight: 223 lb 9.6 oz (101.4 kg)  Height: '5\' 9"'$  (1.753 m)     GEN- The patient is well appearing, alert and oriented x 3 today.   HEENT: normocephalic, atraumatic; sclera clear, conjunctiva pink; hearing intact; oropharynx clear; neck supple  Lungs- Clear to ausculation bilaterally, normal work of breathing.  No wheezes, rales, rhonchi Heart- Regular rate and rhythm, no murmurs, rubs or gallops  GI- soft, non-tender, non-distended, bowel sounds present  Extremities- no clubbing or cyanosis. No edema MS- no significant  deformity or atrophy Skin- warm and dry, no rash or lesion;  PPM pocket well healed Psych- euthymic mood, full affect Neuro- strength and sensation are intact  PPM Interrogation- reviewed in detail today,  See PACEART report  EKG:  EKG is not ordered today. Personal review of ekg ordered  09/01/2021  shows AV dual paced at 60   Recent Labs: 11/11/2021: ALT 14; BUN 18; Creatinine, Ser 0.86; Hemoglobin 14.9; Platelets 181.0; Potassium 4.5; Sodium 139; TSH 3.03   Wt Readings from Last 3 Encounters:  03/16/22 223 lb 9.6 oz (101.4 kg)  03/05/22 225 lb 6.4 oz (102.2 kg)  11/11/21 224 lb (101.6 kg)     Other studies Reviewed: Additional studies/ records that were reviewed today include: Previous EP office notes, Previous remote checks, Most recent labwork.   Assessment and Plan:  1. CHB s/p St. Jude PPM  Normal PPM function See Pace Art report No changes today  2. Atrial Fibrillation Rates overall well controlled. Continue Coumadin   3. Clearance for Right Vitrectomy.  He has been previously cleared off warfarin to bridge with lovenox by Dr. Morrison Old team.  He IS device dependent. Device check normal today.   Current medicines are reviewed at length with the patient today.    Disposition:   Follow up with Dr. Lovena Le in 12 months for pacemaker exam.    Signed, Shirley Friar, PA-C  03/16/2022 10:09 AM  [facility] [address] [place] [address] [phone] (office) [phone] (fax)

## 2022-03-16 ENCOUNTER — Encounter: Payer: Self-pay | Admitting: Student

## 2022-03-16 ENCOUNTER — Ambulatory Visit (INDEPENDENT_AMBULATORY_CARE_PROVIDER_SITE_OTHER): Payer: Medicare Other | Admitting: Student

## 2022-03-16 VITALS — BP 106/60 | HR 62 | Ht 69.0 in | Wt 223.6 lb

## 2022-03-16 DIAGNOSIS — I442 Atrioventricular block, complete: Secondary | ICD-10-CM | POA: Diagnosis not present

## 2022-03-16 DIAGNOSIS — Z79899 Other long term (current) drug therapy: Secondary | ICD-10-CM | POA: Diagnosis not present

## 2022-03-16 DIAGNOSIS — I4891 Unspecified atrial fibrillation: Secondary | ICD-10-CM

## 2022-03-16 LAB — CUP PACEART INCLINIC DEVICE CHECK
Battery Remaining Longevity: 30 mo
Battery Voltage: 2.92 V
Brady Statistic RA Percent Paced: 65 %
Brady Statistic RV Percent Paced: 99.61 %
Date Time Interrogation Session: 20230801102051
Implantable Lead Implant Date: 20161108
Implantable Lead Implant Date: 20161108
Implantable Lead Location: 753859
Implantable Lead Location: 753860
Implantable Pulse Generator Implant Date: 20161108
Lead Channel Impedance Value: 425 Ohm
Lead Channel Impedance Value: 450 Ohm
Lead Channel Pacing Threshold Amplitude: 0.75 V
Lead Channel Pacing Threshold Amplitude: 0.75 V
Lead Channel Pacing Threshold Amplitude: 0.75 V
Lead Channel Pacing Threshold Pulse Width: 0.5 ms
Lead Channel Pacing Threshold Pulse Width: 0.5 ms
Lead Channel Pacing Threshold Pulse Width: 0.5 ms
Lead Channel Sensing Intrinsic Amplitude: 12 mV
Lead Channel Sensing Intrinsic Amplitude: 5 mV
Lead Channel Setting Pacing Amplitude: 1 V
Lead Channel Setting Pacing Amplitude: 2 V
Lead Channel Setting Pacing Pulse Width: 0.5 ms
Lead Channel Setting Sensing Sensitivity: 5 mV
Pulse Gen Model: 2240
Pulse Gen Serial Number: 7825693

## 2022-03-16 NOTE — Patient Instructions (Signed)
Medication Instructions:  Your physician recommends that you continue on your current medications as directed. Please refer to the Current Medication list given to you today.  *If you need a refill on your cardiac medications before your next appointment, please call your pharmacy*   Lab Work: None  If you have labs (blood work) drawn today and your tests are completely normal, you will receive your results only by: MyChart Message (if you have MyChart) OR A paper copy in the mail If you have any lab test that is abnormal or we need to change your treatment, we will call you to review the results.   Follow-Up: At [facility], you and your health needs are our priority.  As part of our continuing mission to provide you with exceptional heart care, we have created designated Provider Care Teams.  These Care Teams include your primary Cardiologist (physician) and Advanced Practice Providers (APPs -  Physician Assistants and Nurse Practitioners) who all work together to provide you with the care you need, when you need it.  We recommend signing up for the patient portal called "MyChart".  Sign up information is provided on this After Visit Summary.  MyChart is used to connect with patients for Virtual Visits (Telemedicine).  Patients are able to view lab/test results, encounter notes, upcoming appointments, etc.  Non-urgent messages can be sent to your provider as well.   To learn more about what you can do with MyChart, go to [email or web address].    Your next appointment:   1 year(s)  The format for your next appointment:   In Person  Provider:   Gregg Taylor, MD{ 

## 2022-03-19 ENCOUNTER — Ambulatory Visit (INDEPENDENT_AMBULATORY_CARE_PROVIDER_SITE_OTHER): Payer: Medicare Other

## 2022-03-19 DIAGNOSIS — Z79899 Other long term (current) drug therapy: Secondary | ICD-10-CM

## 2022-03-19 DIAGNOSIS — Z5181 Encounter for therapeutic drug level monitoring: Secondary | ICD-10-CM | POA: Diagnosis not present

## 2022-03-19 DIAGNOSIS — Z952 Presence of prosthetic heart valve: Secondary | ICD-10-CM

## 2022-03-19 LAB — POCT INR: INR: 1.4 — AB (ref 2.0–3.0)

## 2022-03-19 NOTE — Patient Instructions (Signed)
TAKE 2 TABLETS TODAY ONLY and then continue 1 tablet daily except 1/2 tablet on Monday, Wednesday and Saturday; INR in 2 weeks. Coumadin Clinic [phone].

## 2022-03-24 ENCOUNTER — Ambulatory Visit (INDEPENDENT_AMBULATORY_CARE_PROVIDER_SITE_OTHER): Payer: Medicare Other

## 2022-03-24 DIAGNOSIS — I442 Atrioventricular block, complete: Secondary | ICD-10-CM | POA: Diagnosis not present

## 2022-03-24 LAB — CUP PACEART REMOTE DEVICE CHECK
Battery Remaining Longevity: 31 mo
Battery Remaining Percentage: 27 %
Battery Voltage: 2.92 V
Brady Statistic AP VP Percent: 30 %
Brady Statistic AP VS Percent: 1 %
Brady Statistic AS VP Percent: 70 %
Brady Statistic AS VS Percent: 1 %
Brady Statistic RA Percent Paced: 27 %
Brady Statistic RV Percent Paced: 99 %
Date Time Interrogation Session: 20230808022014
Implantable Lead Implant Date: 20161108
Implantable Lead Implant Date: 20161108
Implantable Lead Location: 753859
Implantable Lead Location: 753860
Implantable Pulse Generator Implant Date: 20161108
Lead Channel Impedance Value: 410 Ohm
Lead Channel Impedance Value: 440 Ohm
Lead Channel Pacing Threshold Amplitude: 0.75 V
Lead Channel Pacing Threshold Amplitude: 0.75 V
Lead Channel Pacing Threshold Pulse Width: 0.5 ms
Lead Channel Pacing Threshold Pulse Width: 0.5 ms
Lead Channel Sensing Intrinsic Amplitude: 12 mV
Lead Channel Sensing Intrinsic Amplitude: 5 mV
Lead Channel Setting Pacing Amplitude: 1 V
Lead Channel Setting Pacing Amplitude: 2 V
Lead Channel Setting Pacing Pulse Width: 0.5 ms
Lead Channel Setting Sensing Sensitivity: 5 mV
Pulse Gen Model: 2240
Pulse Gen Serial Number: 7825693

## 2022-04-01 ENCOUNTER — Ambulatory Visit (INDEPENDENT_AMBULATORY_CARE_PROVIDER_SITE_OTHER): Payer: Medicare Other | Admitting: *Deleted

## 2022-04-01 DIAGNOSIS — Z952 Presence of prosthetic heart valve: Secondary | ICD-10-CM | POA: Diagnosis not present

## 2022-04-01 DIAGNOSIS — Z5181 Encounter for therapeutic drug level monitoring: Secondary | ICD-10-CM

## 2022-04-01 DIAGNOSIS — I4891 Unspecified atrial fibrillation: Secondary | ICD-10-CM | POA: Diagnosis not present

## 2022-04-01 DIAGNOSIS — Z79899 Other long term (current) drug therapy: Secondary | ICD-10-CM

## 2022-04-01 DIAGNOSIS — I639 Cerebral infarction, unspecified: Secondary | ICD-10-CM

## 2022-04-01 LAB — POCT INR: INR: 3.7 — AB (ref 2.0–3.0)

## 2022-04-01 NOTE — Patient Instructions (Signed)
Description   Do not take any Warfarin today then continue taking 1 tablet daily except 1/2 tablet on Monday, Wednesday and Saturday; INR in 2 weeks. Coumadin Clinic [phone] or [phone].

## 2022-04-06 ENCOUNTER — Ambulatory Visit (INDEPENDENT_AMBULATORY_CARE_PROVIDER_SITE_OTHER): Payer: Medicare Other | Admitting: Adult Health

## 2022-04-06 ENCOUNTER — Encounter: Payer: Self-pay | Admitting: Adult Health

## 2022-04-06 VITALS — BP 122/64 | HR 79 | Temp 97.7°F | Ht 69.0 in | Wt 221.0 lb

## 2022-04-06 DIAGNOSIS — C44722 Squamous cell carcinoma of skin of right lower limb, including hip: Secondary | ICD-10-CM

## 2022-04-06 DIAGNOSIS — Z8673 Personal history of transient ischemic attack (TIA), and cerebral infarction without residual deficits: Secondary | ICD-10-CM

## 2022-04-06 DIAGNOSIS — D492 Neoplasm of unspecified behavior of bone, soft tissue, and skin: Secondary | ICD-10-CM

## 2022-04-06 NOTE — Progress Notes (Signed)
Subjective:    Patient ID: [name], male    DOB: [birth year], 88 y.o.   MRN: [id number]  HPI  88 year old male who  has a past medical history of Atrial fibrillation ([place]), Bladder cancer ([place]), Bradycardia, DEGENERATIVE JOINT DISEASE, SPINE, ERECTILE DYSFUNCTION, Essential hypertension, First degree AV block, Hearing deficit, HEARING LOSS, Heart block, Hyperlipidemia, Hypertrophic obstructive cardiomyopathy ([place]), LBBB (left bundle branch block), Macular degeneration, OA (osteoarthritis), Pericardial effusion, Presence of permanent cardiac pacemaker (06/24/2015), Prostate cancer ([place]) (2010), S/P TAVR (transcatheter aortic valve replacement) (03/25/2015), Severe aortic stenosis, Squamous cell skin cancer, Stroke ([place]) (02/19/2014), TIA (transient ischemic attack) (01/21/2014), Uses walker, and Wears glasses.  He presents to the office today for an acute issue of a "boil" on his right inner thigh.  First noticed about a week ago and seems to be growing in size.  He has not had any pain, redness, or drainage.  He did stick a sterile needle into the lesion but nothing came out of it.  At home he has been applying Neosporin  He does have history of skin cancer   Review of Systems See HPI   Past Medical History:  Diagnosis Date   Atrial fibrillation ([place])    a. dx after lead revision 06/26/15 >> anticoag not started due to recent pericardial effusion in setting of perforation and lead revision   Bladder cancer ([place])    chemo done 2021   Bradycardia    a. HR 30s in clinic 06/2015 - BB discontinued.   DEGENERATIVE JOINT DISEASE, SPINE    ERECTILE DYSFUNCTION    Essential hypertension    First degree AV block    Hearing deficit    wears hearing aides both ears   HEARING LOSS    Heart block    s/p Pacemaker   Hyperlipidemia    Hypertrophic obstructive cardiomyopathy (HCC)    LBBB (left bundle branch block)    Macular degeneration    left eye gets shots for   OA (osteoarthritis)    both  hips   Pericardial effusion    a. 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 s/p TAVR 03/2015. Pre-op  LHC 02/2015: minor nonobstructive CAD.   Squamous cell skin cancer    left lower leg   Stroke ([place]) 02/19/2014   Small infarct high posterior left frontal lobe on MRI     TIA (transient ischemic attack) 01/21/2014   5 min spell aphasia without assoc sx     Uses walker    Wears glasses     Social History   Socioeconomic History   Marital status: Married    Spouse name: Not on file   Number of children: 4   Years of education: 16   Highest education level: Not on file  Occupational History   Occupation: Retired  Tobacco Use   Smoking status: Never   Smokeless tobacco: Never  Vaping Use   Vaping Use: Never used  Substance and Sexual Activity   Alcohol use: No    Alcohol/week: 0.0 standard drinks of alcohol   Drug use: No   Sexual activity: Not Currently  Other Topics Concern   Not  on file  Social History Narrative   Patient lives in [place] with his wife. 2nd marriage    4 children    He goes to the [facility] three days a week      09/27/18: Lives at [facility] with wife   Still goes to [facility], 2X/week, bicycles/uses sitting eliptical in room daily   Enjoys singing, part of singing group at center   Active in discussion groups at center         Social Determinants of Health   Financial Resource Strain: High Risk (08/14/2021)   Overall Financial Resource Strain (CARDIA)    Difficulty of Paying Living Expenses: Very hard  Food Insecurity: No Food Insecurity (08/14/2021)   Hunger Vital Sign    Worried About Running Out of Food in the Last  Year: Never true    [place] in the Last Year: Never true  Transportation Needs: No Transportation Needs (08/14/2021)   PRAPARE - Hydrologist (Medical): No    Lack of Transportation (Non-Medical): No  Physical Activity: Insufficiently Active (08/14/2021)   Exercise Vital Sign    Days of Exercise per Week: 7 days    Minutes of Exercise per Session: 10 min  Stress: No Stress Concern Present (08/14/2021)   [place]    Feeling of Stress : Not at all  Social Connections: [place] (08/14/2021)   Social Connection and Isolation Panel [NHANES]    Frequency of Communication with Friends and Family: More than three times a week    Frequency of Social Gatherings with Friends and Family: More than three times a week    Attends Religious Services: More than 4 times per year    Active Member of Clubs or Organizations: Yes    Attends Archivist Meetings: More than 4 times per year    Marital Status: Married  Human resources officer Violence: Not At Risk (08/14/2021)   Humiliation, Afraid, Rape, and Kick questionnaire    Fear of Current or Ex-Partner: No    Emotionally Abused: No    Physically Abused: No    Sexually Abused: No    Past Surgical History:  Procedure Laterality Date   ANTERIOR CERVICAL DECOMP/DISCECTOMY FUSION  ~ 2000   Dr. Louanne Skye with bone graft   CARDIAC CATHETERIZATION N/A 02/20/2015   Procedure: Right/Left Heart Cath and Coronary Angiography;  Surgeon: Sherren Mocha, MD;  Location: [place] [facility];  Service: Cardiovascular;  Laterality: N/A;   CARDIAC VALVE REPLACEMENT     CATARACT EXTRACTION W/ INTRAOCULAR LENS  IMPLANT, BILATERAL  2004; 2010   right; left   CYSTOSCOPY W/ RETROGRADES Bilateral 04/22/2020   Procedure: CYSTOSCOPY WITH RETROGRADE PYELOGRAM/FULGERATION BLADDER BIOPSY;  Surgeon: Remi Haggard, MD;  [facility];  Service: Urology;   Laterality: Bilateral;   CYSTOSCOPY W/ RETROGRADES Bilateral 09/09/2020   Procedure: CYSTOSCOPY WITH RETROGRADE PYELOGRAM;  Surgeon: Remi Haggard, MD;  [facility];  Service: Urology;  Laterality: Bilateral;   CYSTOSCOPY W/ RETROGRADES Bilateral 10/13/2021   Procedure: CYSTOSCOPY WITH RETROGRADE PYELOGRAM;  Surgeon: Remi Haggard, MD;  [facility];  Service: Urology;  Laterality: Bilateral;   EP IMPLANTABLE DEVICE N/A 06/24/2015   Procedure: Pacemaker Implant;  Surgeon: Evans Lance, MD;  Location: [place] [facility];  Service: Cardiovascular; St Jude Assurity    EP IMPLANTABLE DEVICE N/A 06/26/2015   Procedure: Lead Revision/Repair;  Surgeon: Will Meredith Leeds, MD;  Location: [place] [facility];  Service: Cardiovascular;  Laterality: N/A;   INSERT / REPLACE / REMOVE PACEMAKER  06/24/2015   JOINT REPLACEMENT     SQUAMOUS CELL CARCINOMA EXCISION Left X 3   "lower leg"   TEE WITHOUT CARDIOVERSION N/A 03/07/2015   Procedure: TRANSESOPHAGEAL ECHOCARDIOGRAM (TEE);  Surgeon: Dorothy Spark, MD;  Location: [place];  Service: Cardiovascular;  Laterality: N/A;   TEE WITHOUT CARDIOVERSION N/A 03/25/2015   Procedure: TRANSESOPHAGEAL ECHOCARDIOGRAM (TEE);  Surgeon: Burnell Blanks, MD;  Location: [place];  Service: Open Heart Surgery;  Laterality: N/A;   TONSILLECTOMY  as child   TOTAL SHOULDER ARTHROPLASTY Right 2007   "wore it out playing tennis"   TRANSCATHETER AORTIC VALVE REPLACEMENT, TRANSFEMORAL N/A 03/25/2015   Procedure: TRANSCATHETER AORTIC VALVE REPLACEMENT, TRANSFEMORAL;  Surgeon: Burnell Blanks, MD;  Location: [place];  Service: Open Heart Surgery;  Laterality: N/A;   TRANSURETHRAL RESECTION OF BLADDER TUMOR N/A 09/09/2020   Procedure: TRANSURETHRAL RESECTION OF BLADDER TUMOR (TURBT);  Surgeon: Remi Haggard, MD;  Location: [facility];  Service: Urology;  Laterality: N/A;  [place]  TUMOR N/A 10/13/2021   Procedure: TRANSURETHRAL RESECTION OF BLADDER TUMOR (TURBT);  Surgeon: Remi Haggard, MD;  [facility];  Service: Urology;  Laterality: N/A;  14 MINS    Family History  Problem Relation Age of Onset   Heart disease Mother    CVA Mother    Stroke Mother    CVA Father    Cancer Father    Brain cancer Sister    Prostate cancer Brother    Heart disease Brother    Lung cancer Brother    Heart attack Brother    Hypertension Neg Hx     No Known Allergies  Current Outpatient Medications on File Prior to Visit  Medication Sig Dispense Refill   amoxicillin (AMOXIL) 500 MG capsule Take 2,000 mg by mouth See admin instructions. Take 2000 mg 1 hour prior to dental work     mirabegron ER (MYRBETRIQ) 50 MG TB24 tablet Take 50 mg by mouth every other day. At lunch     Multiple Vitamins-Minerals (PRESERVISION AREDS 2+MULTI VIT PO) Take 1 tablet by mouth 2 (two) times daily.     Omega-3 Fatty Acids (FISH OIL) 1000 MG CAPS Take 1,000 mg by mouth 2 (two) times daily.     polyethylene glycol powder (MIRALAX) 17 GM/SCOOP powder Take 1 Container by mouth as needed.     PREVIDENT 5000 SENSITIVE 1.1-5 % PSTE Apply 1 application topically at bedtime.      rosuvastatin (CRESTOR) 5 MG tablet Take 1 tablet (5 mg total) by mouth daily. (Patient taking differently: Take 5 mg by mouth every evening.) 90 tablet 1   tamsulosin (FLOMAX) 0.4 MG CAPS capsule Take 0.4 mg by mouth daily after supper.      warfarin (COUMADIN) 3 MG tablet TAKE 1/2 TO 1 TABLET DAILY OR AS DIRECTED BY COUMADIN CLINIC 95 tablet 1   No current facility-administered medications on file prior to visit.    BP 122/64   Pulse 79   Temp 97.7 F (36.5 C) (Oral)   Ht '5\' 9"'$  (1.753 m)   Wt 221 lb (100.2 kg)   SpO2 95%   BMI 32.64 kg/m       Objective:   Physical Exam Vitals and nursing note reviewed.  Constitutional:      Appearance: Normal appearance.  Skin:  General: Skin is warm and dry.      Findings: Lesion present.       Neurological:     General: No focal deficit present.     Mental Status: He is alert and oriented to person, place, and time.  Psychiatric:        Mood and Affect: Mood normal.        Behavior: Behavior normal.       Assessment & Plan:  1. Skin neoplasm - Concern for BCC. No signs of abscess.  Procedure including risks/benefits explained to patient.  Questions were answered. After informed consent was obtained and a time out completed, site was cleansed with betadine and then alcohol. 1% Lidocaine with epinephrine was injected under lesion and then shave biopsy was performed. Area was cauterized to obtain hemostasis.  Pt tolerated procedure well.  Specimen sent for pathology review.  Pt instructed to keep the area dry for 24 hours and to contact us if he develops redness, drainage or swelling at the site.  Pt may use tylenol as needed for discomfort today.   - Dermatology pathology - Likely need to be referred to dermatology   Dorothyann Peng, NP

## 2022-04-08 ENCOUNTER — Telehealth: Payer: Self-pay | Admitting: Adult Health

## 2022-04-08 NOTE — Telephone Encounter (Signed)
Dermatologist is requesting a copy of the biopsies done at previous visit.   Dr Wilhemina Bonito fax [phone]

## 2022-04-08 NOTE — Telephone Encounter (Signed)
Pathology report faxed to [phone] via electronic release system.

## 2022-04-08 NOTE — Telephone Encounter (Signed)
Updated patient wife on derm path results which showed Squamous cell carcinoma. He has a dermatologist and will follow up with them

## 2022-04-15 ENCOUNTER — Ambulatory Visit: Payer: Medicare Other | Attending: Cardiology

## 2022-04-15 DIAGNOSIS — Z952 Presence of prosthetic heart valve: Secondary | ICD-10-CM | POA: Diagnosis not present

## 2022-04-15 DIAGNOSIS — Z79899 Other long term (current) drug therapy: Secondary | ICD-10-CM | POA: Insufficient documentation

## 2022-04-15 DIAGNOSIS — Z5181 Encounter for therapeutic drug level monitoring: Secondary | ICD-10-CM | POA: Diagnosis not present

## 2022-04-15 LAB — POCT INR: INR: 3.6 — AB (ref 2.0–3.0)

## 2022-04-15 NOTE — Patient Instructions (Signed)
Description   Do not take any Warfarin today then START taking 1 tablet daily except 1/2 tablet on Monday, Wednesday Friday and Saturday;  Recheck INR in 2 weeks. [facility] [phone] or [phone].

## 2022-04-27 NOTE — Progress Notes (Signed)
Remote pacemaker transmission.   

## 2022-04-29 ENCOUNTER — Ambulatory Visit: Payer: Medicare Other | Attending: Cardiology

## 2022-04-29 DIAGNOSIS — Z952 Presence of prosthetic heart valve: Secondary | ICD-10-CM | POA: Diagnosis not present

## 2022-04-29 DIAGNOSIS — Z79899 Other long term (current) drug therapy: Secondary | ICD-10-CM

## 2022-04-29 DIAGNOSIS — Z5181 Encounter for therapeutic drug level monitoring: Secondary | ICD-10-CM | POA: Insufficient documentation

## 2022-04-29 DIAGNOSIS — I4891 Unspecified atrial fibrillation: Secondary | ICD-10-CM | POA: Insufficient documentation

## 2022-04-29 LAB — POCT INR: INR: 2.7 (ref 2.0–3.0)

## 2022-04-29 NOTE — Patient Instructions (Signed)
Description   Continue taking 1 tablet daily except 1/2 tablet on Monday, Wednesday Friday and Saturday;  Recheck INR in 5 weeks. [facility] [phone] or [phone].

## 2022-05-19 ENCOUNTER — Other Ambulatory Visit: Payer: Self-pay | Admitting: Cardiology

## 2022-05-19 DIAGNOSIS — Z952 Presence of prosthetic heart valve: Secondary | ICD-10-CM

## 2022-05-19 DIAGNOSIS — I10 Essential (primary) hypertension: Secondary | ICD-10-CM

## 2022-05-19 DIAGNOSIS — E785 Hyperlipidemia, unspecified: Secondary | ICD-10-CM

## 2022-06-03 ENCOUNTER — Ambulatory Visit: Payer: Medicare Other | Attending: Cardiology

## 2022-06-03 DIAGNOSIS — Z952 Presence of prosthetic heart valve: Secondary | ICD-10-CM | POA: Diagnosis not present

## 2022-06-03 DIAGNOSIS — Z5181 Encounter for therapeutic drug level monitoring: Secondary | ICD-10-CM

## 2022-06-03 DIAGNOSIS — Z79899 Other long term (current) drug therapy: Secondary | ICD-10-CM | POA: Diagnosis not present

## 2022-06-03 LAB — POCT INR: INR: 2.9 (ref 2.0–3.0)

## 2022-06-03 NOTE — Patient Instructions (Signed)
Description   Continue taking 1 tablet daily except 1/2 tablet on Monday, Wednesday Friday and Saturday;  Recheck INR in 6 weeks. [facility] [phone] or [phone].

## 2022-06-23 ENCOUNTER — Ambulatory Visit (INDEPENDENT_AMBULATORY_CARE_PROVIDER_SITE_OTHER): Payer: Medicare Other

## 2022-06-23 DIAGNOSIS — Z950 Presence of cardiac pacemaker: Secondary | ICD-10-CM

## 2022-06-23 DIAGNOSIS — I442 Atrioventricular block, complete: Secondary | ICD-10-CM | POA: Diagnosis not present

## 2022-06-23 LAB — CUP PACEART REMOTE DEVICE CHECK
Battery Remaining Longevity: 29 mo
Battery Remaining Percentage: 25 %
Battery Voltage: 2.92 V
Brady Statistic AP VP Percent: 28 %
Brady Statistic AP VS Percent: 1 %
Brady Statistic AS VP Percent: 71 %
Brady Statistic AS VS Percent: 1 %
Brady Statistic RA Percent Paced: 25 %
Brady Statistic RV Percent Paced: 99 %
Date Time Interrogation Session: 20231107020013
Implantable Lead Connection Status: 753985
Implantable Lead Connection Status: 753985
Implantable Lead Implant Date: 20161108
Implantable Lead Implant Date: 20161108
Implantable Lead Location: 753859
Implantable Lead Location: 753860
Implantable Pulse Generator Implant Date: 20161108
Lead Channel Impedance Value: 410 Ohm
Lead Channel Impedance Value: 430 Ohm
Lead Channel Pacing Threshold Amplitude: 0.75 V
Lead Channel Pacing Threshold Amplitude: 0.875 V
Lead Channel Pacing Threshold Pulse Width: 0.5 ms
Lead Channel Pacing Threshold Pulse Width: 0.5 ms
Lead Channel Sensing Intrinsic Amplitude: 12 mV
Lead Channel Sensing Intrinsic Amplitude: 5 mV
Lead Channel Setting Pacing Amplitude: 1.125
Lead Channel Setting Pacing Amplitude: 2 V
Lead Channel Setting Pacing Pulse Width: 0.5 ms
Lead Channel Setting Sensing Sensitivity: 5 mV
Pulse Gen Model: 2240
Pulse Gen Serial Number: 7825693

## 2022-07-06 NOTE — Progress Notes (Signed)
Remote pacemaker transmission.   

## 2022-07-15 ENCOUNTER — Ambulatory Visit: Payer: Medicare Other | Attending: Cardiology

## 2022-07-15 DIAGNOSIS — Z5181 Encounter for therapeutic drug level monitoring: Secondary | ICD-10-CM

## 2022-07-15 DIAGNOSIS — Z952 Presence of prosthetic heart valve: Secondary | ICD-10-CM | POA: Diagnosis not present

## 2022-07-15 DIAGNOSIS — Z79899 Other long term (current) drug therapy: Secondary | ICD-10-CM

## 2022-07-15 DIAGNOSIS — I4891 Unspecified atrial fibrillation: Secondary | ICD-10-CM | POA: Diagnosis not present

## 2022-07-15 LAB — POCT INR: INR: 2.9 (ref 2.0–3.0)

## 2022-07-15 NOTE — Patient Instructions (Signed)
Description   Continue taking 1 tablet daily except 1/2 tablet on Monday, Wednesday Friday and Saturday;  Recheck INR in 7 weeks. [facility] [phone] or [phone].

## 2022-07-19 ENCOUNTER — Ambulatory Visit (INDEPENDENT_AMBULATORY_CARE_PROVIDER_SITE_OTHER): Payer: Medicare Other

## 2022-07-19 VITALS — Ht 69.0 in | Wt 221.0 lb

## 2022-07-19 DIAGNOSIS — Z0000 Encounter for general adult medical examination without abnormal findings: Secondary | ICD-10-CM

## 2022-07-19 NOTE — Progress Notes (Signed)
Subjective:   Robert Anderson is a 88 y.o. male who presents for Medicare Annual/Subsequent preventive examination.  Review of Systems    Virtual Visit via Telephone Note  I connected with  [name] on 07/19/22 at  1:30 PM EST by telephone and verified that I am speaking with the correct person using two identifiers.  Location: Patient: Home Provider: Office Persons participating in the virtual visit: patient/Nurse Health Advisor   I discussed the limitations, risks, security and privacy concerns of performing an evaluation and management service by telephone and the availability of in person appointments. The patient expressed understanding and agreed to proceed.  Interactive audio and video telecommunications were attempted between this nurse and patient, however failed, due to patient having technical difficulties OR patient did not have access to video capability.  We continued and completed visit with audio only.  Some vital signs may be absent or patient reported.   [name], LPN  Cardiac Risk Factors include: advanced age (>75mn, >>70women);hypertension;male gender     Objective:    Today's Vitals   07/19/22 1335  Weight: 221 lb (100.2 kg)  Height: '5\' 9"'$  (1.753 m)   Body mass index is 32.64 kg/m.     07/19/2022    1:46 PM 10/08/2021   10:57 AM 08/14/2021    1:14 PM 09/09/2020   10:19 AM 04/17/2020   11:15 AM 09/27/2018    4:36 PM 07/06/2015   10:11 AM  Advanced Directives  Does Patient Have a Medical Advance Directive? Yes Yes Yes Yes Yes Yes Yes  Type of AParamedicof AWhat CheerLiving will Living will;Healthcare Power of APleasant ViewLiving will Healthcare Power of ALa FargevilleLiving will HCentral HighLiving will Living will;Healthcare Power of Attorney  Does patient want to make changes to medical advance directive?   No - Patient declined No - Patient declined  No -  Patient declined No - Patient declined  Copy of [place]in Chart? No - copy requested  No - copy requested   No - copy requested No - copy requested    Current Medications (verified) Outpatient Encounter Medications as of 07/19/2022  Medication Sig   amoxicillin (AMOXIL) 500 MG capsule Take 2,000 mg by mouth See admin instructions. Take 2000 mg 1 hour prior to dental work   mirabegron ER (MYRBETRIQ) 50 MG TB24 tablet Take 50 mg by mouth every other day. At lunch   Multiple Vitamins-Minerals (PRESERVISION AREDS 2+MULTI VIT PO) Take 1 tablet by mouth 2 (two) times daily.   Omega-3 Fatty Acids (FISH OIL) 1000 MG CAPS Take 1,000 mg by mouth 2 (two) times daily.   polyethylene glycol powder (MIRALAX) 17 GM/SCOOP powder Take 1 Container by mouth as needed.   PREVIDENT 5000 SENSITIVE 1.1-5 % PSTE Apply 1 application topically at bedtime.    rosuvastatin (CRESTOR) 5 MG tablet TAKE 1 TABLET BY MOUTH DAILY.   tamsulosin (FLOMAX) 0.4 MG CAPS capsule Take 0.4 mg by mouth daily after supper.    warfarin (COUMADIN) 3 MG tablet TAKE 1/2 TO 1 TABLET DAILY OR AS DIRECTED BY COUMADIN CLINIC   No facility-administered encounter medications on file as of 07/19/2022.    Allergies (verified) Patient has no known allergies.   History: Past Medical History:  Diagnosis Date   Atrial fibrillation ([place]    a. dx after lead revision 06/26/15 >> anticoag not started due to recent pericardial effusion in setting of  perforation and lead revision   Bladder cancer ([place])    chemo done 2021   Bradycardia    a. HR 30s in clinic 06/2015 - BB discontinued.   DEGENERATIVE JOINT DISEASE, SPINE    ERECTILE DYSFUNCTION    Essential hypertension    First degree AV block    Hearing deficit    wears hearing aides both ears   HEARING LOSS    Heart block    s/p Pacemaker   Hyperlipidemia    Hypertrophic obstructive cardiomyopathy (HCC)    LBBB (left bundle branch block)    Macular degeneration     left eye gets shots for   OA (osteoarthritis)    both hips   Pericardial effusion    a. 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 s/p TAVR 03/2015. Pre-op  LHC 02/2015: minor nonobstructive CAD.   Squamous cell skin cancer    left lower leg   Stroke ([place]) 02/19/2014   Small infarct high posterior left frontal lobe on MRI     TIA (transient ischemic attack) 01/21/2014   5 min spell aphasia without assoc sx     Uses walker    Wears glasses    Past Surgical History:  Procedure Laterality Date   ANTERIOR CERVICAL DECOMP/DISCECTOMY FUSION  ~ 2000   Dr. Louanne Skye with bone graft   CARDIAC CATHETERIZATION N/A 02/20/2015   Procedure: Right/Left Heart Cath and Coronary Angiography;  Surgeon: Sherren Mocha, MD;  Location: [place] [facility];  Service: Cardiovascular;  Laterality: N/A;   CARDIAC VALVE REPLACEMENT     CATARACT EXTRACTION W/ INTRAOCULAR LENS  IMPLANT, BILATERAL  2004; 2010   right; left   CYSTOSCOPY W/ RETROGRADES Bilateral 04/22/2020   Procedure: CYSTOSCOPY WITH RETROGRADE PYELOGRAM/FULGERATION BLADDER BIOPSY;  Surgeon: Remi Haggard, MD;  [facility];  Service: Urology;  Laterality: Bilateral;   CYSTOSCOPY W/ RETROGRADES Bilateral 09/09/2020   Procedure: CYSTOSCOPY WITH RETROGRADE PYELOGRAM;  Surgeon: Remi Haggard, MD;  Location: [facility];  Service: Urology;  Laterality: Bilateral;   CYSTOSCOPY W/ RETROGRADES Bilateral 10/13/2021   Procedure: CYSTOSCOPY WITH RETROGRADE PYELOGRAM;  Surgeon: Remi Haggard, MD;  [facility];  Service: Urology;  Laterality: Bilateral;   EP IMPLANTABLE DEVICE N/A 06/24/2015   Procedure: Pacemaker  Implant;  Surgeon: Evans Lance, MD;  Location: [place] [facility];  Service: Cardiovascular; St Jude Assurity    EP IMPLANTABLE DEVICE N/A 06/26/2015   Procedure: Lead Revision/Repair;  Surgeon: Will Meredith Leeds, MD;  Location: [place] [facility];  Service: Cardiovascular;  Laterality: N/A;   INSERT / REPLACE / REMOVE PACEMAKER  06/24/2015   JOINT REPLACEMENT     SQUAMOUS CELL CARCINOMA EXCISION Left X 3   "lower leg"   TEE WITHOUT CARDIOVERSION N/A 03/07/2015   Procedure: TRANSESOPHAGEAL ECHOCARDIOGRAM (TEE);  Surgeon: Dorothy Spark, MD;  Location: [place];  Service: Cardiovascular;  Laterality: N/A;   TEE WITHOUT CARDIOVERSION N/A 03/25/2015   Procedure: TRANSESOPHAGEAL ECHOCARDIOGRAM (TEE);  Surgeon: Burnell Blanks, MD;  Location: [place];  Service: Open Heart Surgery;  Laterality: N/A;   TONSILLECTOMY  as child   TOTAL SHOULDER ARTHROPLASTY Right 2007   "  wore it out playing tennis"   TRANSCATHETER AORTIC VALVE REPLACEMENT, TRANSFEMORAL N/A 03/25/2015   Procedure: TRANSCATHETER AORTIC VALVE REPLACEMENT, TRANSFEMORAL;  Surgeon: Burnell Blanks, MD;  Location: [place];  Service: Open Heart Surgery;  Laterality: N/A;   TRANSURETHRAL RESECTION OF BLADDER TUMOR N/A 09/09/2020   Procedure: TRANSURETHRAL RESECTION OF BLADDER TUMOR (TURBT);  Surgeon: Remi Haggard, MD;  Location: [facility];  Service: Urology;  Laterality: N/A;  [place] TUMOR N/A 10/13/2021   Procedure: TRANSURETHRAL RESECTION OF BLADDER TUMOR (TURBT);  Surgeon: Remi Haggard, MD;  [facility];  Service: Urology;  Laterality: N/A;  2 MINS   Family History  Problem Relation Age of Onset   Heart disease Mother    CVA Mother    Stroke Mother    CVA Father    Cancer Father    Brain cancer Sister    Prostate cancer Brother    Heart disease Brother    Lung cancer Brother    Heart attack Brother    Hypertension Neg Hx    Social History    Socioeconomic History   Marital status: Married    Spouse name: Not on file   Number of children: 4   Years of education: 16   Highest education level: Not on file  Occupational History   Occupation: Retired  Tobacco Use   Smoking status: Never   Smokeless tobacco: Never  Scientific laboratory technician Use: Never used  Substance and Sexual Activity   Alcohol use: No    Alcohol/week: 0.0 standard drinks of alcohol   Drug use: No   Sexual activity: Not Currently  Other Topics Concern   Not on file  Social History Narrative   Patient lives in [place] with his wife. 2nd marriage    4 children    He goes to the [facility] three days a week      09/27/18: Lives at [facility]   Still goes to [facility], 2X/week, bicycles/uses sitting eliptical in room daily   Enjoys singing, part of singing group at center   Active in discussion groups at center         Social Determinants of Health   Financial Resource Strain: Low Risk  (07/19/2022)   Overall Financial Resource Strain (CARDIA)    Difficulty of Paying Living Expenses: Not hard at all  Food Insecurity: No Food Insecurity (07/19/2022)   Hunger Vital Sign    Worried About Running Out of Food in the Last Year: Never true    [place] in the Last Year: Never true  Transportation Needs: No Transportation Needs (07/19/2022)   PRAPARE - Hydrologist (Medical): No    Lack of Transportation (Non-Medical): No  Physical Activity: Insufficiently Active (07/19/2022)   Exercise Vital Sign    Days of Exercise per Week: 7 days    Minutes of Exercise per Session: 20 min  Stress: No Stress Concern Present (07/19/2022)   [place]    Feeling of Stress : Not at all  Social Connections: [place] (07/19/2022)   Social Connection and Isolation Panel [NHANES]    Frequency of Communication with Friends and Family: More than three times  a week    Frequency of Social Gatherings with Friends and Family: More than three times a week    Attends Religious Services: More than 4 times per year  Active Member of Clubs or Organizations: Yes    Attends Music therapist: More than 4 times per year    Marital Status: Married    Tobacco Counseling Counseling given: Not Answered   Clinical Intake:  Pre-visit preparation completed: No  Pain : No/denies pain     BMI - recorded: 32.64 Nutritional Status: BMI > 30  Obese Nutritional Risks: None Diabetes: No  How often do you need to have someone help you when you read instructions, pamphlets, or other written materials from your doctor or pharmacy?: 1 - Never  Diabetic?  No  Interpreter Needed?: No  Information entered by :: Rolene Arbour LPN   Activities of Daily Living    07/19/2022    1:44 PM 10/08/2021   11:01 AM  In your present state of health, do you have any difficulty performing the following activities:  Hearing? 1   Comment Wears hearing aids   Vision? 0   Difficulty concentrating or making decisions? 0   Walking or climbing stairs? 0   Dressing or bathing? 0   Doing errands, shopping? 0 0  Preparing Food and eating ? N   Using the Toilet? N   In the past six months, have you accidently leaked urine? N   Do you have problems with loss of bowel control? N   Managing your Medications? N   Managing your Finances? N   Housekeeping or managing your Housekeeping? N     Patient Care Team: Dorothyann Peng, NP as PCP - General (Family Medicine) Evans Lance, MD as PCP - Electrophysiology (Cardiology) Martinique, Peter M, MD as PCP - Cardiology (Cardiology) Dorothy Spark, MD as Consulting Physician (Cardiology) Burnell Blanks, MD as Consulting Physician (Cardiology) Evans Lance, MD as Consulting Physician (Clinical Cardiac Electrophysiology) Danella Sensing, MD as Consulting Physician (Dermatology) Desma Maxim, MD as  Referring Physician (Ophthalmology) Kathie Rhodes, MD (Inactive) as Consulting Physician (Urology)  Indicate any recent Medical Services you may have received from other than [facility] providers in the past year (date may be approximate).     Assessment:   This is a routine wellness examination for Coda.  Hearing/Vision screen Hearing Screening - Comments:: Wears hearing aids Vision Screening - Comments:: Wears rx glasses - up to date with routine eye exams with  Dr Celene Squibb  Dietary issues and exercise activities discussed: Exercise limited by: None identified   Goals Addressed               This Visit's Progress     No current goals (pt-stated)         Depression Screen    07/19/2022    1:41 PM 09/07/2021   10:06 AM 08/14/2021    1:08 PM 11/04/2020   10:31 AM 09/27/2018    4:38 PM 08/04/2017    9:29 AM 05/16/2014    9:52 AM  PHQ 2/9 Scores  PHQ - 2 Score 0 0 0 0 0 0 0  PHQ- 9 Score     1      Fall Risk    07/19/2022    1:45 PM 11/11/2021    9:35 AM 09/07/2021   10:06 AM 08/14/2021    1:10 PM 11/04/2020   10:31 AM  [place] in the past year? 0 0 0 0 0  Number falls in past yr: 0 0  0   Injury with Fall? 0 0  0   Risk for fall due to : No  Fall Risks No Fall Risks     Follow up Falls prevention discussed Falls evaluation completed       FALL RISK PREVENTION PERTAINING TO THE HOME:  Any stairs in or around the home? Yes If so, are there any without handrails? No  Home free of loose throw rugs in walkways, pet beds, electrical cords, etc? Yes  Adequate lighting in your home to reduce risk of falls? Yes   ASSISTIVE DEVICES UTILIZED TO PREVENT FALLS:  Life alert? No Use of a cane, walker or w/c? Yes  Grab bars in the bathroom? Yes  Shower chair or bench in shower? Yes  Elevated toilet seat or a handicapped toilet? Yes   TIMED UP AND GO:  Was the test performed? No . Audio Visit  Cognitive Function:        07/19/2022    1:46 PM 08/14/2021    1:12 PM   6CIT Screen  What Year? 0 points 0 points  What month? 0 points 0 points  What time? 0 points 0 points  Count back from 20 0 points 0 points  Months in reverse 0 points 0 points  Repeat phrase 0 points 0 points  Total Score 0 points 0 points    Immunizations Immunization History  Administered Date(s) Administered   H1N1 09/10/2008   Influenza Whole 05/26/2007, 05/17/2008, 05/15/2009   Influenza, High Dose Seasonal PF 05/27/2015, 05/10/2017, 05/17/2018, 05/30/2019, 05/28/2020, 06/01/2021   Influenza,inj,Quad PF,6+ Mos 05/03/2013, 05/16/2014   Influenza-Unspecified 05/16/2016   Pfizer Covid-19 Vaccine Bivalent Booster 57yr & up 06/07/2021   Pneumococcal Conjugate-13 05/16/2014   Pneumococcal Polysaccharide-23 08/16/2005   Td 08/16/2005, 05/27/2015   Zoster, Live 10/03/2008    TDAP status: Up to date  Flu Vaccine status: Up to date  Pneumococcal vaccine status: Up to date  Covid-19 vaccine status: Completed vaccines  Qualifies for Shingles Vaccine? Yes   Zostavax completed No   Shingrix Completed?: No.    Education has been provided regarding the importance of this vaccine. Patient has been advised to call insurance company to determine out of pocket expense if they have not yet received this vaccine. Advised may also receive vaccine at local pharmacy or Health Dept. Verbalized acceptance and understanding.  Screening Tests Health Maintenance  Topic Date Due   COVID-19 Vaccine (2 - Pfizer risk series) 08/04/2022 (Originally 06/28/2021)   Zoster Vaccines- Shingrix (1 of 2) 10/18/2022 (Originally 07/14/1943)   INFLUENZA VACCINE  11/14/2022 (Originally 03/16/2022)   Medicare Annual Wellness (AWV)  07/20/2023   DTaP/Tdap/Td (3 - Tdap) 05/26/2025   Pneumonia Vaccine 88 Years old  Completed   HPV VACCINES  Aged Out    Health Maintenance  There are no preventive care reminders to display for this patient.   Colorectal cancer screening: No longer required.   Lung Cancer  Screening: (Low Dose CT Chest recommended if Age 86-80years, 30 pack-year currently smoking OR have quit w/in 15years.) does not qualify.     Additional Screening:  Hepatitis C Screening: does not qualify; Completed   Vision Screening: Recommended annual ophthalmology exams for early detection of glaucoma and other disorders of the eye. Is the patient up to date with their annual eye exam?  Yes  Who is the provider or what is the name of the office in which the patient attends annual eye exams? Dr MCelene SquibbIf pt is not established with a provider, would they like to be referred to a provider to establish care? No .   Dental Screening: Recommended  annual dental exams for proper oral hygiene  Community Resource Referral / Chronic Care Management:  CRR required this visit?  No   CCM required this visit?  No      Plan:     I have personally reviewed and noted the following in the patient's chart:   Medical and social history Use of alcohol, tobacco or illicit drugs  Current medications and supplements including opioid prescriptions. Patient is not currently taking opioid prescriptions. Functional ability and status Nutritional status Physical activity Advanced directives List of other physicians Hospitalizations, surgeries, and ER visits in previous 12 months Vitals Screenings to include cognitive, depression, and falls Referrals and appointments  In addition, I have reviewed and discussed with patient certain preventive protocols, quality metrics, and best practice recommendations. A written personalized care plan for preventive services as well as general preventive health recommendations were provided to patient.     [name], LPN   [id number]   Nurse Notes: None

## 2022-07-19 NOTE — Patient Instructions (Addendum)
[name] , Thank you for taking time to come for your Medicare Wellness Visit. I appreciate your ongoing commitment to your health goals. Please review the following plan we discussed and let me know if I can assist you in the future.   These are the goals we discussed:  Goals       No current goals (pt-stated)      Patient Stated      Reduce simple sugar intake to help with triglycerides Keep doing what you're doing.        This is a list of the screening recommended for you and due dates:  Health Maintenance  Topic Date Due   COVID-19 Vaccine (2 - Pfizer risk series) 08/04/2022*   Zoster (Shingles) Vaccine (1 of 2) 10/18/2022*   Flu Shot  11/14/2022*   Medicare Annual Wellness Visit  07/20/2023   DTaP/Tdap/Td vaccine (3 - Tdap) 05/26/2025   Pneumonia Vaccine  Completed   HPV Vaccine  Aged Out  *Topic was postponed. The date shown is not the original due date.    Advanced directives: Please bring a copy of your health care power of attorney and living will to the office to be added to your chart at your convenience.   Conditions/risks identified: None  Next appointment: Follow up in one year for your annual wellness visit.   Preventive Care 60 Years and Older, Male  Preventive care refers to lifestyle choices and visits with your health care provider that can promote health and wellness. What does preventive care include? A yearly physical exam. This is also called an annual well check. Dental exams once or twice a year. Routine eye exams. Ask your health care provider how often you should have your eyes checked. Personal lifestyle choices, including: Daily care of your teeth and gums. Regular physical activity. Eating a healthy diet. Avoiding tobacco and drug use. Limiting alcohol use. Practicing safe sex. Taking low doses of aspirin every day. Taking vitamin and mineral supplements as recommended by your health care provider. What happens during an annual well  check? The services and screenings done by your health care provider during your annual well check will depend on your age, overall health, lifestyle risk factors, and family history of disease. Counseling  Your health care provider may ask you questions about your: Alcohol use. Tobacco use. Drug use. Emotional well-being. Home and relationship well-being. Sexual activity. Eating habits. History of falls. Memory and ability to understand (cognition). Work and work Statistician. Screening  You may have the following tests or measurements: Height, weight, and BMI. Blood pressure. Lipid and cholesterol levels. These may be checked every 5 years, or more frequently if you are over 28 years old. Skin check. Lung cancer screening. You may have this screening every year starting at age 62 if you have a 30-pack-year history of smoking and currently smoke or have quit within the past 15 years. Fecal occult blood test (FOBT) of the stool. You may have this test every year starting at age 39. Flexible sigmoidoscopy or colonoscopy. You may have a sigmoidoscopy every 5 years or a colonoscopy every 10 years starting at age 20. Prostate cancer screening. Recommendations will vary depending on your family history and other risks. Hepatitis C blood test. Hepatitis B blood test. Sexually transmitted disease (STD) testing. Diabetes screening. This is done by checking your blood sugar (glucose) after you have not eaten for a while (fasting). You may have this done every 1-3 years. Abdominal aortic aneurysm (AAA) screening.  You may need this if you are a current or former smoker. Osteoporosis. You may be screened starting at age 56 if you are at high risk. Talk with your health care provider about your test results, treatment options, and if necessary, the need for more tests. Vaccines  Your health care provider may recommend certain vaccines, such as: Influenza vaccine. This is recommended every  year. Tetanus, diphtheria, and acellular pertussis (Tdap, Td) vaccine. You may need a Td booster every 10 years. Zoster vaccine. You may need this after age 56. Pneumococcal 13-valent conjugate (PCV13) vaccine. One dose is recommended after age 20. Pneumococcal polysaccharide (PPSV23) vaccine. One dose is recommended after age 51. Talk to your health care provider about which screenings and vaccines you need and how often you need them. This information is not intended to replace advice given to you by your health care provider. Make sure you discuss any questions you have with your health care provider. Document Released: 08/29/2015 Document Revised: 04/21/2016 Document Reviewed: 06/03/2015 Elsevier Interactive Patient Education  2017 [place] Prevention in the Home Falls can cause injuries. They can happen to people of all ages. There are many things you can do to make your home safe and to help prevent falls. What can I do on the outside of my home? Regularly fix the edges of walkways and driveways and fix any cracks. Remove anything that might make you trip as you walk through a door, such as a raised step or threshold. Trim any bushes or trees on the path to your home. Use bright outdoor lighting. Clear any walking paths of anything that might make someone trip, such as rocks or tools. Regularly check to see if handrails are loose or broken. Make sure that both sides of any steps have handrails. Any raised decks and porches should have guardrails on the edges. Have any leaves, snow, or ice cleared regularly. Use sand or salt on walking paths during winter. Clean up any spills in your garage right away. This includes oil or grease spills. What can I do in the bathroom? Use night lights. Install grab bars by the toilet and in the tub and shower. Do not use towel bars as grab bars. Use non-skid mats or decals in the tub or shower. If you need to sit down in the shower, use a  plastic, non-slip stool. Keep the floor dry. Clean up any water that spills on the floor as soon as it happens. Remove soap buildup in the tub or shower regularly. Attach bath mats securely with double-sided non-slip rug tape. Do not have throw rugs and other things on the floor that can make you trip. What can I do in the bedroom? Use night lights. Make sure that you have a light by your bed that is easy to reach. Do not use any sheets or blankets that are too big for your bed. They should not hang down onto the floor. Have a firm chair that has side arms. You can use this for support while you get dressed. Do not have throw rugs and other things on the floor that can make you trip. What can I do in the kitchen? Clean up any spills right away. Avoid walking on wet floors. Keep items that you use a lot in easy-to-reach places. If you need to reach something above you, use a strong step stool that has a grab bar. Keep electrical cords out of the way. Do not use floor polish or wax  that makes floors slippery. If you must use wax, use non-skid floor wax. Do not have throw rugs and other things on the floor that can make you trip. What can I do with my stairs? Do not leave any items on the stairs. Make sure that there are handrails on both sides of the stairs and use them. Fix handrails that are broken or loose. Make sure that handrails are as long as the stairways. Check any carpeting to make sure that it is firmly attached to the stairs. Fix any carpet that is loose or worn. Avoid having throw rugs at the top or bottom of the stairs. If you do have throw rugs, attach them to the floor with carpet tape. Make sure that you have a light switch at the top of the stairs and the bottom of the stairs. If you do not have them, ask someone to add them for you. What else can I do to help prevent falls? Wear shoes that: Do not have high heels. Have rubber bottoms. Are comfortable and fit you  well. Are closed at the toe. Do not wear sandals. If you use a stepladder: Make sure that it is fully opened. Do not climb a closed stepladder. Make sure that both sides of the stepladder are locked into place. Ask someone to hold it for you, if possible. Clearly mark and make sure that you can see: Any grab bars or handrails. First and last steps. Where the edge of each step is. Use tools that help you move around (mobility aids) if they are needed. These include: Canes. Walkers. Scooters. Crutches. Turn on the lights when you go into a dark area. Replace any light bulbs as soon as they burn out. Set up your furniture so you have a clear path. Avoid moving your furniture around. If any of your floors are uneven, fix them. If there are any pets around you, be aware of where they are. Review your medicines with your doctor. Some medicines can make you feel dizzy. This can increase your chance of falling. Ask your doctor what other things that you can do to help prevent falls. This information is not intended to replace advice given to you by your health care provider. Make sure you discuss any questions you have with your health care provider. Document Released: 05/29/2009 Document Revised: 01/08/2016 Document Reviewed: 09/06/2014 Elsevier Interactive Patient Education  2017 [employer].

## 2022-07-26 ENCOUNTER — Telehealth: Payer: Self-pay | Admitting: Adult Health

## 2022-07-26 NOTE — Telephone Encounter (Addendum)
Pt wife is calling and he is at [facility] and they are  giving RSV vaccine and wife would like to know if its ok for her husband to get RSV vaccine

## 2022-07-28 NOTE — Telephone Encounter (Signed)
Tried to call pt spouse no answer. Lm for call back

## 2022-07-29 NOTE — Telephone Encounter (Signed)
Left message to return phone call. Not sure if pt received the vaccine yet. Closing note

## 2022-07-30 NOTE — Telephone Encounter (Signed)
Pt advised that he can get the RSV vaccine. Pt verbalized understanding.

## 2022-07-30 NOTE — Telephone Encounter (Signed)
Pt wife is calling back and they have not yet received rsv vaccine

## 2022-08-31 NOTE — Progress Notes (Signed)
Cardiology Office Note   Date:  03/05/2022   ID:  Robert Anderson, DOB January 20, 1924, MRN [id number]  PCP:  Robert Peng, NP  Cardiologist:   Robert Mi Martinique, MD   Chief Complaint  Patient presents with   Aortic Stenosis      History of Present Illness: Robert Anderson is a 89 y.o. male who presents for follow up heart block and prior TAVR. Previously followed by Dr Robert Anderson. 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 He has recurrent bladder CA and is s/p chemotherapy and resection. Echo in January 2022 showed marked basal septal hypertrophy without outflow gradient. Normal EF. Normal functioning AV prosthesis. Pacemaker check 12/23/20 was normal. Followed by Dr Robert Anderson.   He is living at [facility]. Retired from [employer]. Originally from [place]. Has 4 children by his first marriage. Married again 38 years ago. Generally feels very well. Rides his stationary bike 10 minutes in the morning and 5 in the evening every day. Uses a walker. Denies any chest pain, dyspnea, palpitations or dizziness. Feels like he is doing well.   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 Had a lot of bleeding from the bladder prior to this but none since.  He is feeling very well. No chest pain, dyspnea or palpitations. He has noted more dizziness without syncope. Last pacer check in November did show some Afib with burden 9.8%. rate controlled. He does eat a lot of salt according to wife and is hydrating well.   Past Medical History:  Diagnosis Date   Atrial fibrillation [facility])    a. dx after lead revision 06/26/15 >> anticoag not started due to recent pericardial effusion in setting of perforation and lead revision   Bladder cancer ([place])    chemo  done 2021   Bradycardia    a. HR 30s in clinic 06/2015 - BB discontinued.   DEGENERATIVE JOINT DISEASE, SPINE    ERECTILE DYSFUNCTION    Essential hypertension    First degree AV block    Hearing deficit    wears hearing aides both ears   HEARING LOSS    Heart block    s/p Pacemaker   Hyperlipidemia    Hypertrophic obstructive cardiomyopathy ([facility])    LBBB (left bundle branch block)    Macular degeneration    left eye gets shots for   OA (osteoarthritis)    both hips   Pericardial effusion    a. micorpeforation s/p lead revision 06/26/15   Presence of permanent cardiac pacemaker 06/24/2015   St Jude Assurity   Prostate cancer [facility]) 2010   "external radiation; 40 treatments"   S/P TAVR (transcatheter aortic valve replacement) 03/25/2015   29 mm Edwards Sapien 3 transcatheter heart valve placed via open right transfemoral approach   Severe aortic stenosis    a. s/p TAVR 03/2015. Pre-op  LHC 02/2015: minor nonobstructive CAD.   Squamous cell skin cancer    left lower leg   Stroke ([place]) 02/19/2014   Small infarct high posterior left frontal lobe on MRI     TIA (transient ischemic attack) 01/21/2014   5 min spell aphasia without assoc sx     Uses walker    Wears glasses     Past Surgical History:  Procedure Laterality Date   ANTERIOR CERVICAL DECOMP/DISCECTOMY FUSION  ~ 2000   Dr. Louanne Skye with bone graft   CARDIAC CATHETERIZATION N/A 02/20/2015   Procedure: Right/Left Heart Cath and Coronary Angiography;  Surgeon: Robert Mocha, MD;  Location: [facility];  Service: Cardiovascular;  Laterality: N/A;   CARDIAC VALVE REPLACEMENT     CATARACT EXTRACTION W/ INTRAOCULAR LENS  IMPLANT, BILATERAL  2004; 2010   right; left   CYSTOSCOPY W/ RETROGRADES Bilateral 04/22/2020   Procedure: CYSTOSCOPY WITH RETROGRADE PYELOGRAM/FULGERATION BLADDER BIOPSY;  Surgeon: Robert Haggard, MD;  Location: [facility];  Service: Urology;  Laterality: Bilateral;   CYSTOSCOPY W/ RETROGRADES Bilateral 09/09/2020    Procedure: CYSTOSCOPY WITH RETROGRADE PYELOGRAM;  Surgeon: Robert Haggard, MD;  Location: [facility];  Service: Urology;  Laterality: Bilateral;   CYSTOSCOPY W/ RETROGRADES Bilateral 10/13/2021   Procedure: CYSTOSCOPY WITH RETROGRADE PYELOGRAM;  Surgeon: Robert Haggard, MD;  Location: [facility];  Service: Urology;  Laterality: Bilateral;   EP IMPLANTABLE DEVICE N/A 06/24/2015   Procedure: Pacemaker Implant;  Surgeon: Robert Lance, MD;  Location: [facility];  Service: Cardiovascular; St Jude Assurity    EP IMPLANTABLE DEVICE N/A 06/26/2015   Procedure: Lead Revision/Repair;  Surgeon: Robert Meredith Leeds, MD;  Location: [facility];  Service: Cardiovascular;  Laterality: N/A;   INSERT / REPLACE / REMOVE PACEMAKER  06/24/2015   JOINT REPLACEMENT     SQUAMOUS CELL CARCINOMA EXCISION Left X 3   "lower leg"   TEE WITHOUT CARDIOVERSION N/A 03/07/2015   Procedure: TRANSESOPHAGEAL ECHOCARDIOGRAM (TEE);  Surgeon: Robert Spark, MD;  Location: [facility];  Service: Cardiovascular;  Laterality: N/A;   TEE WITHOUT CARDIOVERSION N/A 03/25/2015   Procedure: TRANSESOPHAGEAL ECHOCARDIOGRAM (TEE);  Surgeon: Robert Blanks, MD;  Location: [facility];  Service: Open Heart Surgery;  Laterality: N/A;   TONSILLECTOMY  as child   TOTAL SHOULDER ARTHROPLASTY Right 2007   "wore it out playing tennis"   TRANSCATHETER AORTIC VALVE REPLACEMENT, TRANSFEMORAL N/A 03/25/2015   Procedure: TRANSCATHETER AORTIC VALVE REPLACEMENT, TRANSFEMORAL;  Surgeon: Robert Blanks, MD;  Location: [facility];  Service: Open Heart Surgery;  Laterality: N/A;   TRANSURETHRAL RESECTION OF BLADDER TUMOR N/A 09/09/2020   Procedure: TRANSURETHRAL RESECTION OF BLADDER TUMOR (TURBT);  Surgeon: Robert Haggard, MD;  Location: [facility];  Service: Urology;  Laterality: N/A;  [place] TUMOR N/A 10/13/2021   Procedure: TRANSURETHRAL RESECTION OF BLADDER TUMOR  (TURBT);  Surgeon: Robert Haggard, MD;  Location: [facility];  Service: Urology;  Laterality: N/A;  30 MINS     Current Outpatient Medications  Medication Sig Dispense Refill   mirabegron ER (MYRBETRIQ) 50 MG TB24 tablet Take 50 mg by mouth every other day. At lunch     Multiple Vitamins-Minerals (PRESERVISION AREDS 2+MULTI VIT PO) Take 1 tablet by mouth 2 (two) times daily.     Omega-3 Fatty Acids (FISH OIL) 1000 MG CAPS Take 1,000 mg by mouth 2 (two) times daily.     polyethylene glycol powder (MIRALAX) 17 GM/SCOOP powder Take 1 Container by mouth as needed.     PREVIDENT 5000 SENSITIVE 1.1-5 % PSTE Apply 1 application topically at bedtime.      rosuvastatin (CRESTOR) 5 MG tablet Take 1 tablet (5 mg total) by mouth daily. (Patient taking differently: Take 5 mg by mouth every evening.) 90 tablet 1   tamsulosin (FLOMAX) 0.4 MG CAPS capsule Take 0.4  mg by mouth daily after supper.      warfarin (COUMADIN) 3 MG tablet TAKE 1/2 TO 1 TABLET DAILY OR AS DIRECTED BY COUMADIN CLINIC 95 tablet 1   amoxicillin (AMOXIL) 500 MG capsule Take 2,000 mg by mouth See admin instructions. Take 2000 mg 1 hour prior to dental work (Patient not taking: Reported on 03/05/2022)     No current facility-administered medications for this visit.    Allergies:   Patient has no known allergies.    Social History:  The patient  reports that he has never smoked. He has never used smokeless tobacco. He reports that he does not drink alcohol and does not use drugs.   Family History:  The patient's family history includes Brain cancer in his sister; CVA in his father and mother; Cancer in his father; Heart attack in his brother; Heart disease in his brother and mother; Lung cancer in his brother; Prostate cancer in his brother; Stroke in his mother.    ROS:  Please see the history of present illness.   Otherwise, review of systems are positive for none.   All other systems are reviewed and negative.    PHYSICAL EXAM: VS:   BP (!) 90/56 (BP Location: Right Arm, Cuff Size: Normal)   Pulse 62   Ht '5\' 9"'$  (1.753 m)   Wt 225 lb 6.4 oz (102.2 kg)   SpO2 91%   BMI 33.29 kg/m  , BMI Body mass index is 33.29 kg/m. GEN: Well nourished, well developed, in no acute distress HEENT: normal Neck: no JVD, carotid bruits, or masses Cardiac: RRR; no murmurs, rubs, or gallops,no edema  Respiratory:  clear to auscultation bilaterally, normal work of breathing GI: soft, nontender, nondistended, + BS MS: no deformity or atrophy Skin: warm and dry, no rash Neuro:  Strength and sensation are intact Psych: euthymic mood, full affect   EKG:  EKG is  ordered today. NSR with A sensing and V pacing. Rate 70. I have personally reviewed and interpreted this study.      Recent Labs: 11/11/2021: ALT 14; BUN 18; Creatinine, Ser 0.86; Hemoglobin 14.9; Platelets 181.0; Potassium 4.5; Sodium 139; TSH 3.03    Lipid Panel    Component Value Date/Time   CHOL 138 11/11/2021 1007   TRIG 169.0 (H) 11/11/2021 1007   HDL 43.60 11/11/2021 1007   CHOLHDL 3 11/11/2021 1007   VLDL 33.8 11/11/2021 1007   LDLCALC 60 11/11/2021 1007   LDLDIRECT 69.0 11/04/2020 1101      Wt Readings from Last 3 Encounters:  03/05/22 225 lb 6.4 oz (102.2 kg)  11/11/21 224 lb (101.6 kg)  10/13/21 229 lb 0.9 oz (103.9 kg)      Other studies Reviewed: Additional studies/ records that were reviewed today include:   Echo 08/26/20: IMPRESSIONS     1. Severe asymmetric septal hypertrophy up to 22 mm consistent with  history of hypertrophic cardiomyopathy. No LVOT obstruction or SAM of the  mitral valve. Left ventricular ejection fraction, by estimation, is 65 to  70%. The left ventricle has normal  function. The left ventricle has no regional wall motion abnormalities.  There is severe asymmetric left ventricular hypertrophy of the septal  segment. Left ventricular diastolic parameters are consistent with Grade  III diastolic dysfunction   (restrictive).   2. Right ventricular systolic function is normal. The right ventricular  size is normal. Tricuspid regurgitation signal is inadequate for assessing  PA pressure.   3. Left atrial size was mildly dilated.  4. The mitral valve is grossly normal. Mild mitral valve regurgitation.  No evidence of mitral stenosis.   5. 29 mm S3 in aortic position. Vmax 2.0 m/s, MG 8.0 mmHG, EOA 3.22 cm2,  DI 0.71. No paravalvular leak. The aortic valve has been  repaired/replaced. Aortic valve regurgitation is not visualized. Procedure  Date: 03/25/2015. Echo findings are consistent  with normal structure and function of the aortic valve prosthesis.   6. The inferior vena cava is normal in size with greater than 50%  respiratory variability, suggesting right atrial pressure of 3 mmHg.   Comparison(s): No significant change from prior study.    ASSESSMENT AND PLAN:  1.  Status post TAVR 2016 last echo Jan 2022 functioning normally. He is asymptomatic. Aware of SBE prophylaxis.    2. Paroxysmal atrial fibrillation on Coumadin.  no significant palpitations.  he is compliant with his warfarin.    3. Hypertrophic nonobstructive cardiomyopathy    4. Hypotension. Previously stopped Toprol but BP remains low. He is symptomatic. Recommend stopping tamsulosin. Wear compression hose. Liberalize sodium intake and maintain good hydration. Robert check labs today   5. Permanent pacemaker followed by Dr. Lovena Anderson - placed for high degree AV block following TAVR   6. Bladder cancer -status post chemo and resection.    7. Hyperlipidemia, on low-dose of rosuvastatin 5 mg daily. Robert check labs today   Plan: stop tamsulosin     Disposition:   FU with me in 6 months  Signed, Darrold Bezek Martinique, MD  03/05/2022 4:06 PM    [facility] [address] [place], [place], [address] Phone [phone], Fax [phone]

## 2022-09-05 ENCOUNTER — Other Ambulatory Visit: Payer: Self-pay | Admitting: Cardiology

## 2022-09-05 DIAGNOSIS — I4891 Unspecified atrial fibrillation: Secondary | ICD-10-CM

## 2022-09-06 ENCOUNTER — Ambulatory Visit (INDEPENDENT_AMBULATORY_CARE_PROVIDER_SITE_OTHER): Payer: Medicare Other | Admitting: *Deleted

## 2022-09-06 ENCOUNTER — Encounter: Payer: Self-pay | Admitting: Cardiology

## 2022-09-06 ENCOUNTER — Ambulatory Visit: Payer: Medicare Other | Attending: Cardiology | Admitting: Cardiology

## 2022-09-06 VITALS — BP 90/58 | HR 70 | Ht 69.0 in | Wt 221.8 lb

## 2022-09-06 DIAGNOSIS — I952 Hypotension due to drugs: Secondary | ICD-10-CM | POA: Insufficient documentation

## 2022-09-06 DIAGNOSIS — Z79899 Other long term (current) drug therapy: Secondary | ICD-10-CM | POA: Insufficient documentation

## 2022-09-06 DIAGNOSIS — I442 Atrioventricular block, complete: Secondary | ICD-10-CM | POA: Insufficient documentation

## 2022-09-06 DIAGNOSIS — I4891 Unspecified atrial fibrillation: Secondary | ICD-10-CM | POA: Diagnosis present

## 2022-09-06 DIAGNOSIS — I639 Cerebral infarction, unspecified: Secondary | ICD-10-CM | POA: Diagnosis not present

## 2022-09-06 DIAGNOSIS — Z5181 Encounter for therapeutic drug level monitoring: Secondary | ICD-10-CM

## 2022-09-06 DIAGNOSIS — Z952 Presence of prosthetic heart valve: Secondary | ICD-10-CM

## 2022-09-06 DIAGNOSIS — I447 Left bundle-branch block, unspecified: Secondary | ICD-10-CM | POA: Diagnosis not present

## 2022-09-06 DIAGNOSIS — Z950 Presence of cardiac pacemaker: Secondary | ICD-10-CM | POA: Diagnosis present

## 2022-09-06 LAB — CBC WITH DIFFERENTIAL/PLATELET

## 2022-09-06 LAB — POCT INR: INR: 2.3 (ref 2.0–3.0)

## 2022-09-06 NOTE — Patient Instructions (Addendum)
Medication Instructions:  Your physician has recommended you make the following change in your medication:  Stop: Tamsulosin (Flomax)  *If you need a refill on your cardiac medications before your next appointment, please call your pharmacy*   Lab Work: Your physician recommends that you have labs drawn today: CBC, CMET, TSH, Lipids  If you have labs (blood work) drawn today and your tests are completely normal, you will receive your results only by: [place] (if you have MyChart) OR A paper copy in the mail If you have any lab test that is abnormal or we need to change your treatment, we will call you to review the results.   Testing/Procedures: None   Follow-Up: At [facility], you and your health needs are our priority.  As part of our continuing mission to provide you with exceptional heart care, we have created designated Provider Care Teams.  These Care Teams include your primary Cardiologist (physician) and Advanced Practice Providers (APPs -  Physician Assistants and Nurse Practitioners) who all work together to provide you with the care you need, when you need it.  We recommend signing up for the patient portal called "MyChart".  Sign up information is provided on this After Visit Summary.  MyChart is used to connect with patients for Virtual Visits (Telemedicine).  Patients are able to view lab/test results, encounter notes, upcoming appointments, etc.  Non-urgent messages can be sent to your provider as well.   To learn more about what you can do with MyChart, go to [email or web address].    Your next appointment:   6 month(s)  Provider:   Peter Martinique, MD     Other Instructions Please get compression socks and wear daily.

## 2022-09-06 NOTE — Patient Instructions (Addendum)
Description   Continue taking warfarin 1 tablet daily except 1/2 tablet on Monday, Wednesday Friday and Saturday; Recheck INR in 8 weeks. [facility]in Clinic [phone] or [phone].

## 2022-09-06 NOTE — Telephone Encounter (Signed)
Refill request for warfarin:  Last INR was 2.9 on 07/15/22 Next INR due 09/06/22 LOV was 03/05/22  P Martinique MD  Refill approved

## 2022-09-07 ENCOUNTER — Encounter: Payer: Self-pay | Admitting: *Deleted

## 2022-09-07 LAB — LIPID PANEL
Chol/HDL Ratio: 2.7 ratio (ref 0.0–5.0)
Cholesterol, Total: 122 mg/dL (ref 100–199)
HDL: 45 mg/dL (ref >39)
LDL Chol Calc (NIH): 45 mg/dL (ref 0–99)
Triglycerides: 195 mg/dL — ABNORMAL HIGH (ref 0–149)
VLDL Cholesterol Cal: 32 mg/dL (ref 5–40)

## 2022-09-07 LAB — CBC WITH DIFFERENTIAL/PLATELET
Basophils Absolute: 0 10*3/uL (ref 0.0–0.2)
Basos: 1 %
EOS (ABSOLUTE): 0.1 10*3/uL (ref 0.0–0.4)
Eos: 1 %
Hematocrit: 45.1 % (ref 37.5–51.0)
Hemoglobin: 14.9 g/dL (ref 13.0–17.7)
Immature Grans (Abs): 0 10*3/uL (ref 0.0–0.1)
Immature Granulocytes: 0 %
Lymphocytes Absolute: 1.6 10*3/uL (ref 0.7–3.1)
Lymphs: 22 %
MCH: 29.3 pg (ref 26.6–33.0)
MCHC: 33 g/dL (ref 31.5–35.7)
MCV: 89 fL (ref 79–97)
Monocytes Absolute: 0.7 10*3/uL (ref 0.1–0.9)
Monocytes: 10 %
Neutrophils Absolute: 4.7 10*3/uL (ref 1.4–7.0)
Neutrophils: 66 %
Platelets: 190 10*3/uL (ref 150–450)
RBC: 5.08 x10E6/uL (ref 4.14–5.80)
RDW: 12.6 % (ref 11.6–15.4)
WBC: 7.1 10*3/uL (ref 3.4–10.8)

## 2022-09-07 LAB — TSH+T4F+T3FREE
Free T4: 1.04 ng/dL (ref 0.82–1.77)
T3, Free: 3.1 pg/mL (ref 2.0–4.4)
TSH: 2.97 u[IU]/mL (ref 0.450–4.500)

## 2022-09-07 LAB — COMPREHENSIVE METABOLIC PANEL
ALT: 14 IU/L (ref 0–44)
AST: 17 IU/L (ref 0–40)
Albumin/Globulin Ratio: 2 (ref 1.2–2.2)
Albumin: 3.8 g/dL (ref 3.6–4.6)
Alkaline Phosphatase: 78 IU/L (ref 44–121)
BUN/Creatinine Ratio: 18 (ref 10–24)
BUN: 16 mg/dL (ref 10–36)
Bilirubin Total: 0.6 mg/dL (ref 0.0–1.2)
CO2: 26 mmol/L (ref 20–29)
Calcium: 9.5 mg/dL (ref 8.6–10.2)
Chloride: 101 mmol/L (ref 96–106)
Creatinine, Ser: 0.87 mg/dL (ref 0.76–1.27)
Globulin, Total: 1.9 g/dL (ref 1.5–4.5)
Glucose: 103 mg/dL — ABNORMAL HIGH (ref 70–99)
Potassium: 4.4 mmol/L (ref 3.5–5.2)
Sodium: 140 mmol/L (ref 134–144)
Total Protein: 5.7 g/dL — ABNORMAL LOW (ref 6.0–8.5)
eGFR: 79 mL/min/{1.73_m2} (ref >59)

## 2022-09-21 LAB — CUP PACEART REMOTE DEVICE CHECK
Battery Remaining Longevity: 25 mo
Battery Remaining Percentage: 22 %
Battery Voltage: 2.9 V
Brady Statistic AP VP Percent: 29 %
Brady Statistic AP VS Percent: 1 %
Brady Statistic AS VP Percent: 70 %
Brady Statistic AS VS Percent: 1 %
Brady Statistic RA Percent Paced: 25 %
Brady Statistic RV Percent Paced: 99 %
Date Time Interrogation Session: 20240206020013
Implantable Lead Connection Status: 753985
Implantable Lead Connection Status: 753985
Implantable Lead Implant Date: 20161108
Implantable Lead Implant Date: 20161108
Implantable Lead Location: 753859
Implantable Lead Location: 753860
Implantable Pulse Generator Implant Date: 20161108
Lead Channel Impedance Value: 410 Ohm
Lead Channel Impedance Value: 430 Ohm
Lead Channel Pacing Threshold Amplitude: 0.75 V
Lead Channel Pacing Threshold Amplitude: 0.75 V
Lead Channel Pacing Threshold Pulse Width: 0.5 ms
Lead Channel Pacing Threshold Pulse Width: 0.5 ms
Lead Channel Sensing Intrinsic Amplitude: 12 mV
Lead Channel Sensing Intrinsic Amplitude: 5 mV
Lead Channel Setting Pacing Amplitude: 1 V
Lead Channel Setting Pacing Amplitude: 2 V
Lead Channel Setting Pacing Pulse Width: 0.5 ms
Lead Channel Setting Sensing Sensitivity: 5 mV
Pulse Gen Model: 2240
Pulse Gen Serial Number: 7825693

## 2022-09-22 ENCOUNTER — Ambulatory Visit (INDEPENDENT_AMBULATORY_CARE_PROVIDER_SITE_OTHER): Payer: Medicare Other

## 2022-09-22 DIAGNOSIS — I442 Atrioventricular block, complete: Secondary | ICD-10-CM | POA: Diagnosis not present

## 2022-10-18 ENCOUNTER — Telehealth: Payer: Self-pay | Admitting: Cardiology

## 2022-10-18 NOTE — Telephone Encounter (Signed)
Patient's wife called and said that the patient would like to talk with Dr. Martinique or nurse.

## 2022-10-18 NOTE — Telephone Encounter (Signed)
Returned call to pt/wife informed of Dr Doug Sou message. They will take every other day then taking his BP 1 hour after and if any cardiac symptoms. He will call if BP starts being low again.

## 2022-10-18 NOTE — Telephone Encounter (Signed)
Pt states that Dr Martinique told him to stop Tamsulosin because he was having increased urination and low BP and decreased urination. He stopped the Tamsulosin. He states that when he first stopped, he was urinating every 1 to 1-1/2 hours. He states that Dr Milford Cage (urology) wanted him to take PCN every other day, Myrbetriq every other day. They also discussed  restarting the Tamsulosin, he thought that he should ask your first before starting. Well he has started the PCN and the Myrbetriq and he states that this has increased his urination to about every 20 minutes. He is wondering if it is ok to restart the Tamsulosin, "maybe this will decrease the urination again"? Please advise.

## 2022-10-19 NOTE — Progress Notes (Signed)
Remote pacemaker transmission.   

## 2022-11-01 ENCOUNTER — Ambulatory Visit: Payer: Medicare Other | Attending: Cardiology | Admitting: Pharmacist

## 2022-11-01 DIAGNOSIS — Z5181 Encounter for therapeutic drug level monitoring: Secondary | ICD-10-CM | POA: Diagnosis present

## 2022-11-01 DIAGNOSIS — Z79899 Other long term (current) drug therapy: Secondary | ICD-10-CM | POA: Diagnosis present

## 2022-11-01 DIAGNOSIS — Z952 Presence of prosthetic heart valve: Secondary | ICD-10-CM | POA: Diagnosis present

## 2022-11-01 DIAGNOSIS — I639 Cerebral infarction, unspecified: Secondary | ICD-10-CM | POA: Diagnosis present

## 2022-11-01 DIAGNOSIS — I4891 Unspecified atrial fibrillation: Secondary | ICD-10-CM | POA: Diagnosis present

## 2022-11-01 LAB — POCT INR: INR: 2.3 (ref 2.0–3.0)

## 2022-11-01 NOTE — Patient Instructions (Signed)
Description   Continue taking warfarin 1 tablet daily except 1/2 tablet on Monday, Wednesday Friday and Saturday; Recheck INR in 8 weeks. [facility] [phone] or [phone].        

## 2022-11-11 ENCOUNTER — Telehealth: Payer: Self-pay | Admitting: Cardiology

## 2022-11-11 NOTE — Telephone Encounter (Signed)
Pt c/o medication issue:  1. Name of Medication: Tamsulosin  2. How are you currently taking this medication (dosage and times per day)?   3. Are you having a reaction (difficulty breathing--STAT)?   4. What is your medication issue? Patient said his Urinologist took him off this medicine on 11-09-22 and put him on Silodosin 8 mg a day

## 2022-11-11 NOTE — Telephone Encounter (Signed)
Patient Urologist Dr. Milford Cage removed the Tamsulosin and started on Silodosin 8mg  daily.  Patient was urinating too frequent almost every 45 minutes.  Doesn't have to strain as he did with Tamsulosin.  He can go up to hour to hour and half now.  He has no Side effects of dizziness with this medication either and BP is good.  March 23rd  122/70 March 25th  127/79 March 28th 124/82 Wants provider aware

## 2022-11-12 NOTE — Telephone Encounter (Signed)
Spoke to patient's wife Dr.Jordan happy to hear B/P's good.Advised to continue all medications.6 month follow up appointment scheduled with Dr.Jordan 7/2 at 1:00 pm.

## 2022-11-16 ENCOUNTER — Encounter: Payer: Self-pay | Admitting: Adult Health

## 2022-11-16 ENCOUNTER — Ambulatory Visit (INDEPENDENT_AMBULATORY_CARE_PROVIDER_SITE_OTHER): Payer: Medicare Other | Admitting: Adult Health

## 2022-11-16 VITALS — BP 110/68 | HR 75 | Temp 97.5°F | Ht 69.0 in | Wt 224.0 lb

## 2022-11-16 DIAGNOSIS — E785 Hyperlipidemia, unspecified: Secondary | ICD-10-CM

## 2022-11-16 DIAGNOSIS — I442 Atrioventricular block, complete: Secondary | ICD-10-CM

## 2022-11-16 DIAGNOSIS — Z8551 Personal history of malignant neoplasm of bladder: Secondary | ICD-10-CM

## 2022-11-16 DIAGNOSIS — I10 Essential (primary) hypertension: Secondary | ICD-10-CM | POA: Diagnosis not present

## 2022-11-16 DIAGNOSIS — Z952 Presence of prosthetic heart valve: Secondary | ICD-10-CM | POA: Diagnosis not present

## 2022-11-16 NOTE — Progress Notes (Signed)
Subjective:    Patient ID: Robert Anderson, male    DOB: [birth year], 89 y.o.   MRN: [id number]  HPI Patient presents for yearly preventative medicine examination. 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  Aortic valve disease-status post TAVR in 2016.  He is managed by cardiology.  Denies chest pain, shortness of breath, DOE, dizziness, or palpitations  PAF-  anticoagulation with Coumadin. He has not had significant palpitations.   Complete heart block-has pacemaker.  Managed by cardiology  Hypertension-controlled with Toprol 25 mg daily BP Readings from Last 3 Encounters:  11/16/22 110/68  09/06/22 (!) 90/58  04/06/22 122/64    Hyperlipidemia-takes Crestor 5 mg daily.  He denies myalgia dose Lab Results  Component Value Date   CHOL 122 09/06/2022   HDL 45 09/06/2022   LDLCALC 45 09/06/2022   LDLDIRECT 69.0 11/04/2020   TRIG 195 (H) 09/06/2022   CHOLHDL 2.7 09/06/2022    History of bladder cancer-was diagnosed with bladder cancer in September 2021 and underwent cystoscopy followed by 6 weeks of chemotherapy which he completed without adverse effects.  He had a transurethral resection of the bladder tumor in January 2022 and repeat in February 2023 for recurrent of bladder cancer.  Currently prescribed Myrbetriq and Rapaflo 8 mg  capsules. Flomax was stopped due to hypotension. He is getting up 6 times a night to urinate and he is fine with this.    All immunizations and health maintenance protocols were reviewed with the patient and needed orders were placed.  Appropriate screening laboratory values were ordered for the patient including screening of hyperlipidemia, renal function and hepatic function.  Medication reconciliation,  past medical history, social history, problem list and allergies were reviewed in detail with the patient  Goals were established with regard to weight loss, exercise, and  diet in compliance with medications. He continues to ride a stationary bike for about 10 times a day and does 150 crunches a day.   Wt Readings from Last 3 Encounters:  11/16/22 224 lb (101.6 kg)  09/06/22 221 lb 12.8 oz (100.6 kg)  07/19/22 221 lb (100.2 kg)   He has no acute complaints today, states " I feel good".   Review of Systems  Constitutional: Negative.   HENT: Negative.    Eyes: Negative.   Respiratory: Negative.    Cardiovascular: Negative.   Gastrointestinal: Negative.   Endocrine: Negative.   Genitourinary: Negative.   Musculoskeletal:  Positive for arthralgias, back pain and gait problem.  Skin: Negative.   Allergic/Immunologic: Negative.   Hematological: Negative.   Psychiatric/Behavioral: Negative.    All other systems reviewed and are negative.  Past Medical History:  Diagnosis Date   Atrial fibrillation    a. dx after lead revision 06/26/15 >> anticoag not started due to recent pericardial effusion in setting of perforation and lead revision   Bladder cancer    chemo done 2021   Bradycardia    a. HR 30s in clinic 06/2015 - BB discontinued.   DEGENERATIVE JOINT DISEASE,  SPINE    ERECTILE DYSFUNCTION    Essential hypertension    First degree AV block    Hearing deficit    wears hearing aides both ears   HEARING LOSS    Heart block    s/p Pacemaker   Hyperlipidemia     Hypertrophic obstructive cardiomyopathy    LBBB (left bundle branch block)    Macular degeneration    left eye gets shots for   OA (osteoarthritis)    both hips   Pericardial effusion    a. micorpeforation s/p lead revision 06/26/15   Presence of permanent cardiac pacemaker 06/24/2015   St Jude Assurity   Prostate cancer 2010   "external radiation; 40 treatments"   S/P TAVR (transcatheter aortic valve replacement) 03/25/2015   29 mm Edwards Sapien 3 transcatheter heart valve placed via open right transfemoral approach   Severe aortic stenosis    a. s/p TAVR 03/2015. Pre-op  LHC 02/2015: minor nonobstructive CAD.   Squamous cell skin cancer    left lower leg   Stroke 02/19/2014   Small infarct high posterior left frontal lobe on MRI     TIA (transient ischemic attack) 01/21/2014   5 min spell aphasia without assoc sx     Uses walker    Wears glasses     Social History   Socioeconomic History   Marital status: Married    Spouse name: Not on file   Number of children: 4   Years of education: 16   Highest education level: Not on file  Occupational History   Occupation: Retired  Tobacco Use   Smoking status: Never   Smokeless tobacco: Never  Vaping Use   Vaping Use: Never used  Substance and Sexual Activity   Alcohol use: No    Alcohol/week: 0.0 standard drinks of alcohol   Drug use: No   Sexual activity: Not Currently  Other Topics Concern   Not on file  Social History Narrative   Patient lives in [place] with his wife. 2nd marriage    4 children    He goes to the [facility] three days a week      09/27/18: Lives at [facility] with wife   Still goes to [facility], 2X/week, bicycles/uses sitting eliptical in room daily   Enjoys singing, part of singing group at center   Active in discussion groups at center         Social Determinants of Health   Financial Resource Strain: Low Risk  (07/19/2022)   Overall Financial Resource Strain (CARDIA)    Difficulty of Paying  Living Expenses: Not hard at all  Food Insecurity: No Food Insecurity (07/19/2022)   Hunger Vital Sign    Worried About Running Out of Food in the Last Year: Never true    [place] in the Last Year: Never true  Transportation Needs: No Transportation Needs (07/19/2022)   PRAPARE - Hydrologist (Medical): No    Lack of Transportation (Non-Medical): No  Physical Activity: Insufficiently Active (07/19/2022)   Exercise Vital Sign    Days of Exercise per Week: 7 days    Minutes of Exercise per Session: 20 min  Stress: No Stress Concern Present (07/19/2022)   [place]    Feeling of Stress : Not at all  Social Connections: [place] (07/19/2022)   Social Connection and Isolation Panel [NHANES]    Frequency of Communication with Friends and Family:  More than three times a week    Frequency of Social Gatherings with Friends and Family: More than three times a week    Attends Religious Services: More than 4 times per year    Active Member of Clubs or Organizations: Yes    Attends Archivist Meetings: More than 4 times per year    Marital Status: Married  Human resources officer Violence: Not At Risk (07/19/2022)   Humiliation, Afraid, Rape, and Kick questionnaire    Fear of Current or Ex-Partner: No    Emotionally Abused: No    Physically Abused: No    Sexually Abused: No    Past Surgical History:  Procedure Laterality Date   ANTERIOR CERVICAL DECOMP/DISCECTOMY FUSION  ~ 2000   Dr. Louanne Skye with bone graft   CARDIAC CATHETERIZATION N/A 02/20/2015   Procedure: Right/Left Heart Cath and Coronary Angiography;  Surgeon: Sherren Mocha, MD;  Location: [facility];  Service: Cardiovascular;  Laterality: N/A;   CARDIAC VALVE REPLACEMENT     CATARACT EXTRACTION W/ INTRAOCULAR LENS  IMPLANT, BILATERAL  2004; 2010   right; left   CYSTOSCOPY W/ RETROGRADES Bilateral 04/22/2020    Procedure: CYSTOSCOPY WITH RETROGRADE PYELOGRAM/FULGERATION BLADDER BIOPSY;  Surgeon: Remi Haggard, MD;  Location: [facility];  Service: Urology;  Laterality: Bilateral;   CYSTOSCOPY W/ RETROGRADES Bilateral 09/09/2020   Procedure: CYSTOSCOPY WITH RETROGRADE PYELOGRAM;  Surgeon: Remi Haggard, MD;  Location: [facility];  Service: Urology;  Laterality: Bilateral;   CYSTOSCOPY W/ RETROGRADES Bilateral 10/13/2021   Procedure: CYSTOSCOPY WITH RETROGRADE PYELOGRAM;  Surgeon: Remi Haggard, MD;  Location: [facility];  Service: Urology;  Laterality: Bilateral;   EP IMPLANTABLE DEVICE N/A 06/24/2015   Procedure: Pacemaker Implant;  Surgeon: Evans Lance, MD;  Location: [place] [facility];  Service: Cardiovascular; St Jude Assurity    EP IMPLANTABLE DEVICE N/A 06/26/2015   Procedure: Lead Revision/Repair;  Surgeon: Will Meredith Leeds, MD;  Location: [place];  Service: Cardiovascular;  Laterality: N/A;   INSERT / REPLACE / REMOVE PACEMAKER  06/24/2015   JOINT REPLACEMENT     SQUAMOUS CELL CARCINOMA EXCISION Left X 3   "lower leg"   TEE WITHOUT CARDIOVERSION N/A 03/07/2015   Procedure: TRANSESOPHAGEAL ECHOCARDIOGRAM (TEE);  Surgeon: Dorothy Spark, MD;  Location: [place];  Service: Cardiovascular;  Laterality: N/A;   TEE WITHOUT CARDIOVERSION N/A 03/25/2015   Procedure: TRANSESOPHAGEAL ECHOCARDIOGRAM (TEE);  Surgeon: Burnell Blanks, MD;  Location: [facility];  Service: Open Heart Surgery;  Laterality: N/A;   TONSILLECTOMY  as child   TOTAL SHOULDER ARTHROPLASTY Right 2007   "wore it out playing tennis"   TRANSCATHETER AORTIC VALVE REPLACEMENT, TRANSFEMORAL N/A 03/25/2015   Procedure: TRANSCATHETER AORTIC VALVE REPLACEMENT, TRANSFEMORAL;  Surgeon: Burnell Blanks, MD;  Location: [place];  Service: Open Heart Surgery;  Laterality: N/A;   TRANSURETHRAL RESECTION OF BLADDER TUMOR N/A 09/09/2020   Procedure: TRANSURETHRAL RESECTION OF BLADDER TUMOR (TURBT);  Surgeon:  Remi Haggard, MD;  Location: [facility];  Service: Urology;  Laterality: N/A;  [place] TUMOR N/A 10/13/2021   Procedure: TRANSURETHRAL RESECTION OF BLADDER TUMOR (TURBT);  Surgeon: Remi Haggard, MD;  Location: [facility];  Service: Urology;  Laterality: N/A;  56 MINS    Family History  Problem Relation Age of Onset   Heart disease Mother    CVA Mother    Stroke Mother    CVA Father  Cancer Father    Brain cancer Sister    Prostate cancer Brother    Heart disease Brother    Lung cancer Brother    Heart attack Brother    Hypertension Neg Hx     No Known Allergies  Current Outpatient Medications on File Prior to Visit  Medication Sig Dispense Refill   amoxicillin (AMOXIL) 500 MG capsule Take 2,000 mg by mouth See admin instructions. Take 2000 mg 1 hour prior to dental work     mirabegron ER (MYRBETRIQ) 50 MG TB24 tablet Take 50 mg by mouth every other day. At lunch     Multiple Vitamins-Minerals (PRESERVISION AREDS 2+MULTI VIT PO) Take 1 tablet by mouth 2 (two) times daily.     Omega-3 Fatty Acids (FISH OIL) 1000 MG CAPS Take 1,000 mg by mouth 2 (two) times daily.     polyethylene glycol powder (MIRALAX) 17 GM/SCOOP powder Take 1 Container by mouth as needed.     PREVIDENT 5000 SENSITIVE 1.1-5 % PSTE Apply 1 application topically at bedtime.      rosuvastatin (CRESTOR) 5 MG tablet TAKE 1 TABLET BY MOUTH DAILY. 90 tablet 3   silodosin (RAPAFLO) 8 MG CAPS capsule Take 8 mg by mouth every evening. 1 tablet 30 minutes after evening meals     warfarin (COUMADIN) 3 MG tablet TAKE 1/2 TO 1 TABLET BY MOUTH DAILY AS DIRECTED BY COUMADIN CLINIC 95 tablet 1   No current facility-administered medications on file prior to visit.    BP 110/68   Pulse 75   Temp (!) 97.5 F (36.4 C) (Oral)   Ht 5\' 9"  (1.753 m)   Wt 224 lb (101.6 kg)   SpO2 93%   BMI 33.08 kg/m       Objective:   Physical Exam Vitals and nursing note reviewed.   Constitutional:      General: He is not in acute distress.    Appearance: Normal appearance. He is not ill-appearing.  HENT:     Head: Normocephalic and atraumatic.     Right Ear: Tympanic membrane, ear canal and external ear normal. There is no impacted cerumen.     Left Ear: Tympanic membrane, ear canal and external ear normal. There is no impacted cerumen.     Nose: Nose normal. No congestion or rhinorrhea.     Mouth/Throat:     Mouth: Mucous membranes are moist.     Pharynx: Oropharynx is clear.  Eyes:     Extraocular Movements: Extraocular movements intact.     Conjunctiva/sclera: Conjunctivae normal.     Pupils: Pupils are equal, round, and reactive to light.  Neck:     Vascular: No carotid bruit.  Cardiovascular:     Rate and Rhythm: Normal rate and regular rhythm.     Pulses: Normal pulses.     Heart sounds: No murmur heard.    No friction rub. No gallop.  Pulmonary:     Effort: Pulmonary effort is normal.     Breath sounds: Normal breath sounds.  Abdominal:     General: Abdomen is flat. Bowel sounds are normal. There is no distension.     Palpations: Abdomen is soft. There is no mass.     Tenderness: There is no abdominal tenderness. There is no guarding or rebound.     Hernia: No hernia is present.  Musculoskeletal:        General: Normal range of motion.     Cervical back: Normal range of motion and neck supple.  Lymphadenopathy:     Cervical: No cervical adenopathy.  Skin:    General: Skin is warm and dry.     Capillary Refill: Capillary refill takes less than 2 seconds.  Neurological:     General: No focal deficit present.     Mental Status: He is alert and oriented to person, place, and time.     Motor: Weakness present.     Gait: Gait abnormal (walks with a rolling walker).  Psychiatric:        Mood and Affect: Mood normal.        Behavior: Behavior normal.        Thought Content: Thought content normal.        Judgment: Judgment normal.        Assessment & Plan:  1. Essential hypertension - Well controlled.  - Follow up in one year or sooner if needed  2. Hyperlipidemia, unspecified hyperlipidemia type - Continue with low dose crestor   3. Complete heart block - Per cardiology   4. S/P TAVR (transcatheter aortic valve replacement) - Continue with Coumadin as anticoagulant   5. History of bladder cancer - Follow up with Urology as directed.    Dorothyann Peng, NP

## 2022-11-16 NOTE — Patient Instructions (Signed)
It was great seeing you today   We will follow up with you regarding your lab work   Please let me know if you need anything   

## 2022-12-08 ENCOUNTER — Ambulatory Visit (INDEPENDENT_AMBULATORY_CARE_PROVIDER_SITE_OTHER): Payer: Medicare Other | Admitting: Adult Health

## 2022-12-08 ENCOUNTER — Encounter: Payer: Self-pay | Admitting: Adult Health

## 2022-12-08 ENCOUNTER — Ambulatory Visit (INDEPENDENT_AMBULATORY_CARE_PROVIDER_SITE_OTHER): Payer: Medicare Other

## 2022-12-08 VITALS — BP 138/80 | HR 73 | Temp 97.5°F | Ht 69.0 in | Wt 224.0 lb

## 2022-12-08 DIAGNOSIS — R071 Chest pain on breathing: Secondary | ICD-10-CM

## 2022-12-08 NOTE — Progress Notes (Signed)
Subjective:    Patient ID: Robert Anderson, male    DOB: [birth year], 89 y.o.   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  He presents to the office today for an acute issue. He reports that about a week ago he developed pain on his left flank. Pain was noticed when getting out of bed. No pain with walking or sitting.Marland Kitchen He took two tylenol the day he noticed the pain. Over the net few days the pain resolved unless he presses on the area. He denies falling, urinary issues past baseline or radiating pain. He does report " feeling a knot"    Review of Systems See HPI   Past Medical History:  Diagnosis Date   Atrial fibrillation    a. dx after lead revision 06/26/15 >> anticoag not started due to recent pericardial effusion in setting of perforation and lead revision   Bladder cancer    chemo done 2021   Bradycardia    a. HR 30s in clinic 06/2015 - BB discontinued.   DEGENERATIVE JOINT DISEASE, SPINE    ERECTILE DYSFUNCTION    Essential hypertension    First degree AV block    Hearing deficit    wears hearing aides both ears   HEARING LOSS    Heart block    s/p Pacemaker   Hyperlipidemia    Hypertrophic obstructive cardiomyopathy    LBBB (left bundle branch block)    Macular degeneration    left eye gets shots for   OA  (osteoarthritis)    both hips   Pericardial effusion    a. micorpeforation s/p lead revision 06/26/15   Presence of permanent cardiac pacemaker 06/24/2015   St Jude Assurity   Prostate cancer 2010   "external radiation; 40 treatments"   S/P TAVR (transcatheter aortic valve replacement) 03/25/2015   29 mm Edwards Sapien 3 transcatheter heart valve placed via open right transfemoral approach   Severe aortic stenosis    a. s/p TAVR 03/2015. Pre-op  LHC 02/2015: minor nonobstructive CAD.   Squamous cell skin cancer    left lower leg   Stroke 02/19/2014   Small infarct high posterior left frontal lobe on MRI     TIA (transient ischemic attack) 01/21/2014   5 min spell aphasia without assoc sx     Uses walker    Wears glasses     Social History   Socioeconomic History   Marital status: Married    Spouse name: Not on file   Number of children: 4   Years of education: 16   Highest education level: Not on file  Occupational History   Occupation: Retired  Tobacco Use   Smoking status: Never   Smokeless tobacco: Never  Vaping Use   Vaping Use: Never used  Substance and Sexual Activity   Alcohol use: No    Alcohol/week: 0.0 standard drinks of alcohol   Drug use: No   Sexual activity: Not Currently  Other Topics Concern   Not on file  Social  History Narrative   Patient lives in [place] with his wife. 2nd marriage    4 children    He goes to [facility] three days a week      09/27/18: Lives at [facility] with wife   Still goes to [facility], 2X/week, bicycles/uses sitting eliptical in room daily   Enjoys singing, part of singing group at center   Active in discussion groups at center         Social Determinants of Health   Financial Resource Strain: Low Risk  (07/19/2022)   Overall Financial Resource Strain (CARDIA)    Difficulty of Paying Living Expenses: Not hard at all  Food Insecurity: No Food Insecurity (07/19/2022)   Hunger Vital Sign    Worried About Running Out of  Food in the Last Year: Never true    Ran Out of Food in the Last Year: Never true  Transportation Needs: No Transportation Needs (07/19/2022)   PRAPARE - Administrator, Civil Service (Medical): No    Lack of Transportation (Non-Medical): No  Physical Activity: Insufficiently Active (07/19/2022)   Exercise Vital Sign    Days of Exercise per Week: 7 days    Minutes of Exercise per Session: 20 min  Stress: No Stress Concern Present (07/19/2022)   [employer] of Occupational Health - Occupational Stress Questionnaire    Feeling of Stress : Not at all  Social Connections: Socially Integrated (07/19/2022)   Social Connection and Isolation Panel [NHANES]    Frequency of Communication with Friends and Family: More than three times a week    Frequency of Social Gatherings with Friends and Family: More than three times a week    Attends Religious Services: More than 4 times per year    Active Member of Clubs or Organizations: Yes    Attends Banker Meetings: More than 4 times per year    Marital Status: Married  Catering manager Violence: Not At Risk (07/19/2022)   Humiliation, Afraid, Rape, and Kick questionnaire    Fear of Current or Ex-Partner: No    Emotionally Abused: No    Physically Abused: No    Sexually Abused: No    Past Surgical History:  Procedure Laterality Date   ANTERIOR CERVICAL DECOMP/DISCECTOMY FUSION  ~ 2000   Dr. Otelia Sergeant with bone graft   CARDIAC CATHETERIZATION N/A 02/20/2015   Procedure: Right/Left Heart Cath and Coronary Angiography;  Surgeon: Tonny Bollman, MD;  Location: [facility] INVASIVE CV LAB;  Service: Cardiovascular;  Laterality: N/A;   CARDIAC VALVE REPLACEMENT     CATARACT EXTRACTION W/ INTRAOCULAR LENS  IMPLANT, BILATERAL  2004; 2010   right; left   CYSTOSCOPY W/ RETROGRADES Bilateral 04/22/2020   Procedure: CYSTOSCOPY WITH RETROGRADE PYELOGRAM/FULGERATION BLADDER BIOPSY;  Surgeon: Belva Agee, MD;  Location: [facility];  Service:  Urology;  Laterality: Bilateral;   CYSTOSCOPY W/ RETROGRADES Bilateral 09/09/2020   Procedure: CYSTOSCOPY WITH RETROGRADE PYELOGRAM;  Surgeon: Belva Agee, MD;  Location: [facility];  Service: Urology;  Laterality: Bilateral;   CYSTOSCOPY W/ RETROGRADES Bilateral 10/13/2021   Procedure: CYSTOSCOPY WITH RETROGRADE PYELOGRAM;  Surgeon: Belva Agee, MD;  Location: [facility];  Service: Urology;  Laterality: Bilateral;   EP IMPLANTABLE DEVICE N/A 06/24/2015   Procedure: Pacemaker Implant;  Surgeon: Marinus Maw, MD;  Location: [facility] INVASIVE CV LAB;  Service: Cardiovascular; St Jude Assurity    EP IMPLANTABLE DEVICE N/A 06/26/2015   Procedure: Lead Revision/Repair;  Surgeon: Will Jorja Loa, MD;  Location: [facility] INVASIVE CV LAB;  Service: Cardiovascular;  Laterality: N/A;   INSERT / REPLACE / REMOVE PACEMAKER  06/24/2015   JOINT REPLACEMENT     SQUAMOUS CELL CARCINOMA EXCISION Left X 3   "lower leg"   TEE WITHOUT CARDIOVERSION N/A 03/07/2015   Procedure: TRANSESOPHAGEAL ECHOCARDIOGRAM (TEE);  Surgeon: Lars Masson, MD;  Location: [facility] ENDOSCOPY;  Service: Cardiovascular;  Laterality: N/A;   TEE WITHOUT CARDIOVERSION N/A 03/25/2015   Procedure: TRANSESOPHAGEAL ECHOCARDIOGRAM (TEE);  Surgeon: Kathleene Hazel, MD;  Location: [facility] OR;  Service: Open Heart Surgery;  Laterality: N/A;   TONSILLECTOMY  as child   TOTAL SHOULDER ARTHROPLASTY Right 2007   "wore it out playing tennis"   TRANSCATHETER AORTIC VALVE REPLACEMENT, TRANSFEMORAL N/A 03/25/2015   Procedure: TRANSCATHETER AORTIC VALVE REPLACEMENT, TRANSFEMORAL;  Surgeon: Kathleene Hazel, MD;  Location: [facility] OR;  Service: Open Heart Surgery;  Laterality: N/A;   TRANSURETHRAL RESECTION OF BLADDER TUMOR N/A 09/09/2020   Procedure: TRANSURETHRAL RESECTION OF BLADDER TUMOR (TURBT);  Surgeon: Belva Agee, MD;  Location: [facility];  Service: Urology;  Laterality: N/A;  30 MINS   TRANSURETHRAL RESECTION OF  BLADDER TUMOR N/A 10/13/2021   Procedure: TRANSURETHRAL RESECTION OF BLADDER TUMOR (TURBT);  Surgeon: Belva Agee, MD;  Location: [facility];  Service: Urology;  Laterality: N/A;  30 MINS    Family History  Problem Relation Age of Onset   Heart disease Mother    CVA Mother    Stroke Mother    CVA Father    Cancer Father    Brain cancer Sister    Prostate cancer Brother    Heart disease Brother    Lung cancer Brother    Heart attack Brother    Hypertension Neg Hx     No Known Allergies  Current Outpatient Medications on File Prior to Visit  Medication Sig Dispense Refill   amoxicillin (AMOXIL) 500 MG capsule Take 2,000 mg by mouth See admin instructions. Take 2000 mg 1 hour prior to dental work     mirabegron ER (MYRBETRIQ) 50 MG TB24 tablet Take 50 mg by mouth every other day. At lunch     Multiple Vitamins-Minerals (PRESERVISION AREDS 2+MULTI VIT PO) Take 1 tablet by mouth 2 (two) times daily.     Omega-3 Fatty Acids (FISH OIL) 1000 MG CAPS Take 1,000 mg by mouth 2 (two) times daily.     polyethylene glycol powder (MIRALAX) 17 GM/SCOOP powder Take 1 Container by mouth as needed.     PREVIDENT 5000 SENSITIVE 1.1-5 % PSTE Apply 1 application topically at bedtime.      rosuvastatin (CRESTOR) 5 MG tablet TAKE 1 TABLET BY MOUTH DAILY. 90 tablet 3   silodosin (RAPAFLO) 8 MG CAPS capsule Take 8 mg by mouth every evening. 1 tablet 30 minutes after evening meals     warfarin (COUMADIN) 3 MG tablet TAKE 1/2 TO 1 TABLET BY MOUTH DAILY AS DIRECTED BY COUMADIN CLINIC 95 tablet 1   No current facility-administered medications on file prior to visit.    BP 138/80   Pulse 73   Temp (!) 97.5 F (36.4 C) (Oral)   Ht 5\' 9"  (1.753 m)   Wt 224 lb (101.6 kg)   SpO2 94%   BMI 33.08 kg/m       Objective:   Physical Exam Vitals and nursing note reviewed.  Constitutional:      Appearance: Normal appearance.  Musculoskeletal:        General: Tenderness (  with palpation to rib 7 or 8. No  mass felt) present. No swelling or deformity.  Skin:    General: Skin is warm and dry.  Neurological:     General: No focal deficit present.     Mental Status: He is alert and oriented to person, place, and time.  Psychiatric:        Mood and Affect: Mood normal.        Behavior: Behavior normal.        Thought Content: Thought content normal.       Assessment & Plan:  1. Chest pain on breathing - No mass felt but does have pain with tenderness to rib 7 or 8 - it is hard to tell due to posture. Will xray today in the office. Likely muscular in nature  - DG Chest 2 View; Future  Shirline Frees, NP

## 2022-12-22 ENCOUNTER — Ambulatory Visit (INDEPENDENT_AMBULATORY_CARE_PROVIDER_SITE_OTHER): Payer: Medicare Other

## 2022-12-22 DIAGNOSIS — I442 Atrioventricular block, complete: Secondary | ICD-10-CM | POA: Diagnosis not present

## 2022-12-22 LAB — CUP PACEART REMOTE DEVICE CHECK
Battery Remaining Longevity: 22 mo
Battery Remaining Percentage: 19 %
Battery Voltage: 2.89 V
Brady Statistic AP VP Percent: 27 %
Brady Statistic AP VS Percent: 1 %
Brady Statistic AS VP Percent: 72 %
Brady Statistic AS VS Percent: 1 %
Brady Statistic RA Percent Paced: 24 %
Brady Statistic RV Percent Paced: 99 %
Date Time Interrogation Session: 20240508040023
Implantable Lead Connection Status: 753985
Implantable Lead Connection Status: 753985
Implantable Lead Implant Date: 20161108
Implantable Lead Implant Date: 20161108
Implantable Lead Location: 753859
Implantable Lead Location: 753860
Implantable Pulse Generator Implant Date: 20161108
Lead Channel Impedance Value: 390 Ohm
Lead Channel Impedance Value: 430 Ohm
Lead Channel Pacing Threshold Amplitude: 0.75 V
Lead Channel Pacing Threshold Amplitude: 0.875 V
Lead Channel Pacing Threshold Pulse Width: 0.5 ms
Lead Channel Pacing Threshold Pulse Width: 0.5 ms
Lead Channel Sensing Intrinsic Amplitude: 5 mV
Lead Channel Sensing Intrinsic Amplitude: 7.7 mV
Lead Channel Setting Pacing Amplitude: 1.125
Lead Channel Setting Pacing Amplitude: 2 V
Lead Channel Setting Pacing Pulse Width: 0.5 ms
Lead Channel Setting Sensing Sensitivity: 5 mV
Pulse Gen Model: 2240
Pulse Gen Serial Number: 7825693

## 2022-12-27 ENCOUNTER — Ambulatory Visit: Payer: Medicare Other | Attending: Internal Medicine

## 2022-12-27 ENCOUNTER — Other Ambulatory Visit: Payer: Self-pay

## 2022-12-27 DIAGNOSIS — Z79899 Other long term (current) drug therapy: Secondary | ICD-10-CM | POA: Insufficient documentation

## 2022-12-27 DIAGNOSIS — Z5181 Encounter for therapeutic drug level monitoring: Secondary | ICD-10-CM

## 2022-12-27 DIAGNOSIS — I4891 Unspecified atrial fibrillation: Secondary | ICD-10-CM

## 2022-12-27 DIAGNOSIS — Z952 Presence of prosthetic heart valve: Secondary | ICD-10-CM | POA: Diagnosis present

## 2022-12-27 LAB — POCT INR: INR: 2.9 (ref 2.0–3.0)

## 2022-12-27 NOTE — Patient Instructions (Signed)
Continue taking warfarin 1 tablet daily except 1/2 tablet on Monday, Wednesday Friday and Saturday; Recheck INR in 8 weeks. Coumadin Clinic [phone] or [phone].

## 2023-01-12 NOTE — Progress Notes (Signed)
Remote pacemaker transmission.   

## 2023-02-13 NOTE — Progress Notes (Unsigned)
Cardiology Office Note   Date:  02/15/2023   ID:  [name], DOB 1923/09/28, MRN [id number]  PCP:  Shirline Frees, NP  Cardiologist:   Eliot Popper [place], MD   No chief complaint on file.     History of Present Illness: Robert Anderson is a 89 y.o. male who presents for follow up heart block and prior TAVR.  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 History of statin intolerance. He has recurrent bladder CA and is s/p chemotherapy and resection. Echo in January 2022 showed marked basal septal hypertrophy without outflow gradient. Normal EF. Normal functioning AV prosthesis. Pacemaker followed by Dr Ladona Ridgel.   He is living at [facility]. Retired from [employer]. Originally from [place]. Has 4 children by his first marriage. Married again 38 years ago.   He did undergo cystoscopy and TURP in Feb 2023 for transitional cell CA of the bladder.   He is feeling very well. No chest pain, dyspnea or palpitations. No dizziness.  Last pacer check in May did show Afib with burden 10%. rate controlled. He still rides bike daily for 7 minutes. BP has improved.   Past Medical History:  Diagnosis Date   Atrial fibrillation [facility]    a. dx after lead revision 06/26/15 >> anticoag not started due to recent pericardial effusion in setting of perforation and lead revision   Bladder cancer (HCC)    chemo done 2021   Bradycardia    a. HR 30s in clinic 06/2015 - BB discontinued.   DEGENERATIVE JOINT DISEASE, SPINE    ERECTILE DYSFUNCTION    Essential hypertension    First degree AV block    Hearing deficit    wears hearing aides both ears   HEARING LOSS    Heart block    s/p Pacemaker   Hyperlipidemia    Hypertrophic obstructive cardiomyopathy (HCC)    LBBB (left bundle branch block)    Macular  degeneration    left eye gets shots for   OA (osteoarthritis)    both hips   Pericardial effusion    a. micorpeforation s/p lead revision 06/26/15   Presence of permanent cardiac pacemaker 06/24/2015   St Jude Assurity   Prostate cancer [facility]) 2010   "external radiation; 40 treatments"   S/P TAVR (transcatheter aortic valve replacement) 03/25/2015   29 mm Edwards Sapien 3 transcatheter heart valve placed via open right transfemoral approach   Severe aortic stenosis    a. s/p TAVR 03/2015. Pre-op  LHC 02/2015: minor nonobstructive CAD.   Squamous cell skin cancer    left lower leg   Stroke (HCC) 02/19/2014   Small infarct high posterior left frontal lobe on MRI     TIA (transient ischemic attack) 01/21/2014   5 min spell aphasia without assoc sx     Uses walker    Wears glasses     Past Surgical History:  Procedure Laterality Date   ANTERIOR CERVICAL DECOMP/DISCECTOMY FUSION  ~ 2000   Dr. Otelia Sergeant with bone graft   CARDIAC CATHETERIZATION N/A 02/20/2015   Procedure: Right/Left Heart Cath and Coronary Angiography;  Surgeon: Tonny Bollman, MD;  Location: [facility] INVASIVE CV LAB;  Service: Cardiovascular;  Laterality: N/A;   CARDIAC VALVE REPLACEMENT     CATARACT EXTRACTION W/ INTRAOCULAR LENS  IMPLANT, BILATERAL  2004;  2010   right; left   CYSTOSCOPY W/ RETROGRADES Bilateral 04/22/2020   Procedure: CYSTOSCOPY WITH RETROGRADE PYELOGRAM/FULGERATION BLADDER BIOPSY;  Surgeon: Belva Agee, MD;  Location: [facility];  Service: Urology;  Laterality: Bilateral;   CYSTOSCOPY W/ RETROGRADES Bilateral 09/09/2020   Procedure: CYSTOSCOPY WITH RETROGRADE PYELOGRAM;  Surgeon: Belva Agee, MD;  Location: [facility];  Service: Urology;  Laterality: Bilateral;   CYSTOSCOPY W/ RETROGRADES Bilateral 10/13/2021   Procedure: CYSTOSCOPY WITH RETROGRADE PYELOGRAM;  Surgeon: Belva Agee, MD;  Location: [facility];  Service: Urology;  Laterality: Bilateral;   EP IMPLANTABLE DEVICE N/A 06/24/2015    Procedure: Pacemaker Implant;  Surgeon: Marinus Maw, MD;  Location: [facility] INVASIVE CV LAB;  Service: Cardiovascular; St Jude Assurity    EP IMPLANTABLE DEVICE N/A 06/26/2015   Procedure: Lead Revision/Repair;  Surgeon: Will Jorja Loa, MD;  Location: [facility] INVASIVE CV LAB;  Service: Cardiovascular;  Laterality: N/A;   INSERT / REPLACE / REMOVE PACEMAKER  06/24/2015   JOINT REPLACEMENT     SQUAMOUS CELL CARCINOMA EXCISION Left X 3   "lower leg"   TEE WITHOUT CARDIOVERSION N/A 03/07/2015   Procedure: TRANSESOPHAGEAL ECHOCARDIOGRAM (TEE);  Surgeon: Lars Masson, MD;  Location: [facility] ENDOSCOPY;  Service: Cardiovascular;  Laterality: N/A;   TEE WITHOUT CARDIOVERSION N/A 03/25/2015   Procedure: TRANSESOPHAGEAL ECHOCARDIOGRAM (TEE);  Surgeon: Kathleene Hazel, MD;  Location: [facility] OR;  Service: Open Heart Surgery;  Laterality: N/A;   TONSILLECTOMY  as child   TOTAL SHOULDER ARTHROPLASTY Right 2007   "wore it out playing tennis"   TRANSCATHETER AORTIC VALVE REPLACEMENT, TRANSFEMORAL N/A 03/25/2015   Procedure: TRANSCATHETER AORTIC VALVE REPLACEMENT, TRANSFEMORAL;  Surgeon: Kathleene Hazel, MD;  Location: [facility] OR;  Service: Open Heart Surgery;  Laterality: N/A;   TRANSURETHRAL RESECTION OF BLADDER TUMOR N/A 09/09/2020   Procedure: TRANSURETHRAL RESECTION OF BLADDER TUMOR (TURBT);  Surgeon: Belva Agee, MD;  Location: [facility];  Service: Urology;  Laterality: N/A;  30 MINS   TRANSURETHRAL RESECTION OF BLADDER TUMOR N/A 10/13/2021   Procedure: TRANSURETHRAL RESECTION OF BLADDER TUMOR (TURBT);  Surgeon: Belva Agee, MD;  Location: [facility];  Service: Urology;  Laterality: N/A;  30 MINS     Current Outpatient Medications  Medication Sig Dispense Refill   amoxicillin (AMOXIL) 500 MG capsule Take 2,000 mg by mouth See admin instructions. Take 2000 mg 1 hour prior to dental work     mirabegron ER (MYRBETRIQ) 50 MG TB24 tablet Take 50 mg by mouth every other day. At lunch      Multiple Vitamins-Minerals (PRESERVISION AREDS 2+MULTI VIT PO) Take 1 tablet by mouth 2 (two) times daily.     Omega-3 Fatty Acids (FISH OIL) 1000 MG CAPS Take 1,000 mg by mouth 2 (two) times daily.     polyethylene glycol powder (MIRALAX) 17 GM/SCOOP powder Take 1 Container by mouth as needed.     PREVIDENT 5000 SENSITIVE 1.1-5 % PSTE Apply 1 application topically at bedtime.      rosuvastatin (CRESTOR) 5 MG tablet TAKE 1 TABLET BY MOUTH DAILY. 90 tablet 3   silodosin (RAPAFLO) 8 MG CAPS capsule Take 8 mg by mouth every evening. 1 tablet 30 minutes after evening meals     SODIUM FLUORIDE 5000 PLUS 1.1 % CREA dental cream Place 1 Application onto teeth at bedtime.     warfarin (COUMADIN) 3 MG tablet TAKE 1/2 TO 1 TABLET BY MOUTH DAILY AS DIRECTED BY COUMADIN CLINIC 95 tablet 1  No current facility-administered medications for this visit.    Allergies:   Patient has no known allergies.    Social History:  The patient  reports that he has never smoked. He has never used smokeless tobacco. He reports that he does not drink alcohol and does not use drugs.   Family History:  The patient's family history includes Brain cancer in his sister; CVA in his father and mother; Cancer in his father; Heart attack in his brother; Heart disease in his brother and mother; Lung cancer in his brother; Prostate cancer in his brother; Stroke in his mother.    ROS:  Please see the history of present illness.   Otherwise, review of systems are positive for none.   All other systems are reviewed and negative.    PHYSICAL EXAM: VS:  BP 118/64 (BP Location: Left Arm, Patient Position: Sitting, Cuff Size: Normal)   Pulse 91   Ht 5\' 9"  (1.753 m)   Wt 221 lb 9.6 oz (100.5 kg)   SpO2 95%   BMI 32.72 kg/m  , BMI Body mass index is 32.72 kg/m. GEN: Well nourished, well developed, in no acute distress HEENT: normal Neck: no JVD, carotid bruits, or masses Cardiac: RRR; no murmurs, rubs, or gallops,no edema   Respiratory:  clear to auscultation bilaterally, normal work of breathing GI: soft, nontender, nondistended, + BS MS: no deformity or atrophy Skin: warm and dry, no rash Neuro:  Strength and sensation are intact Psych: euthymic mood, full affect   EKG:  EKG is not  ordered today.    Recent Labs: 09/06/2022: ALT 14; BUN 16; Creatinine, Ser 0.87; Hemoglobin 14.9; Platelets 190; Potassium 4.4; Sodium 140; TSH 2.970    Lipid Panel    Component Value Date/Time   CHOL 122 09/06/2022 1016   TRIG 195 (H) 09/06/2022 1016   HDL 45 09/06/2022 1016   CHOLHDL 2.7 09/06/2022 1016   CHOLHDL 3 11/11/2021 1007   VLDL 33.8 11/11/2021 1007   LDLCALC 45 09/06/2022 1016   LDLDIRECT 69.0 11/04/2020 1101      Wt Readings from Last 3 Encounters:  02/15/23 221 lb 9.6 oz (100.5 kg)  12/08/22 224 lb (101.6 kg)  11/16/22 224 lb (101.6 kg)      Other studies Reviewed: Additional studies/ records that were reviewed today include:   Echo 08/26/20: IMPRESSIONS     1. Severe asymmetric septal hypertrophy up to 22 mm consistent with  history of hypertrophic cardiomyopathy. No LVOT obstruction or SAM of the  mitral valve. Left ventricular ejection fraction, by estimation, is 65 to  70%. The left ventricle has normal  function. The left ventricle has no regional wall motion abnormalities.  There is severe asymmetric left ventricular hypertrophy of the septal  segment. Left ventricular diastolic parameters are consistent with Grade  III diastolic dysfunction  (restrictive).   2. Right ventricular systolic function is normal. The right ventricular  size is normal. Tricuspid regurgitation signal is inadequate for assessing  PA pressure.   3. Left atrial size was mildly dilated.   4. The mitral valve is grossly normal. Mild mitral valve regurgitation.  No evidence of mitral stenosis.   5. 29 mm S3 in aortic position. Vmax 2.0 m/s, MG 8.0 mmHG, EOA 3.22 cm2,  DI 0.71. No paravalvular leak. The  aortic valve has been  repaired/replaced. Aortic valve regurgitation is not visualized. Procedure  Date: 03/25/2015. Echo findings are consistent  with normal structure and function of the aortic valve prosthesis.  6. The inferior vena cava is normal in size with greater than 50%  respiratory variability, suggesting right atrial pressure of 3 mmHg.   Comparison(s): No significant change from prior study.    ASSESSMENT AND PLAN:  1.  Status post TAVR 2016 last echo Jan 2022 functioning normally. He is asymptomatic. Aware of SBE prophylaxis.    2. Paroxysmal atrial fibrillation on Coumadin.  no significant palpitations.  he is compliant with his warfarin. INRs have been therapeutic.    3. Hypertrophic nonobstructive cardiomyopathy    4. Hypotension. Previously stopped Toprol. Tamsulosin also held. Now on Rapaflo and BP normal.     5. Permanent pacemaker followed by Dr. Ladona Ridgel - placed for high degree AV block following TAVR   6. Bladder cancer -status post chemo and resection.    7. Hyperlipidemia, on low-dose of rosuvastatin 5 mg daily. Labs ok in Jan   Follow up in 6 months     Disposition:   FU with me in 6 months  Signed, Mossie Gilder [place], MD  02/15/2023 1:10 PM    [facility] [address], [place], [place], [address] Phone [phone], Fax [phone]

## 2023-02-15 ENCOUNTER — Encounter: Payer: Self-pay | Admitting: Cardiology

## 2023-02-15 ENCOUNTER — Ambulatory Visit: Payer: Medicare Other | Attending: Cardiology | Admitting: Cardiology

## 2023-02-15 VITALS — BP 118/64 | HR 91 | Ht 69.0 in | Wt 221.6 lb

## 2023-02-15 DIAGNOSIS — Z952 Presence of prosthetic heart valve: Secondary | ICD-10-CM | POA: Diagnosis present

## 2023-02-15 DIAGNOSIS — I442 Atrioventricular block, complete: Secondary | ICD-10-CM | POA: Diagnosis present

## 2023-02-15 DIAGNOSIS — I4891 Unspecified atrial fibrillation: Secondary | ICD-10-CM | POA: Insufficient documentation

## 2023-02-15 DIAGNOSIS — I447 Left bundle-branch block, unspecified: Secondary | ICD-10-CM | POA: Insufficient documentation

## 2023-02-15 NOTE — Patient Instructions (Signed)
Medication Instructions:  Continue same medications *If you need a refill on your cardiac medications before your next appointment, please call your pharmacy*   Lab Work: None ordered   Testing/Procedures: None ordered   Follow-Up: At [facility], you and your health needs are our priority.  As part of our continuing mission to provide you with exceptional heart care, we have created designated Provider Care Teams.  These Care Teams include your primary Cardiologist (physician) and Advanced Practice Providers (APPs -  Physician Assistants and Nurse Practitioners) who all work together to provide you with the care you need, when you need it.  We recommend signing up for the patient portal called "MyChart".  Sign up information is provided on this After Visit Summary.  MyChart is used to connect with patients for Virtual Visits (Telemedicine).  Patients are able to view lab/test results, encounter notes, upcoming appointments, etc.  Non-urgent messages can be sent to your provider as well.   To learn more about what you can do with MyChart, go to [email or web address].    Your next appointment:  6 months    Call in August to schedule Jan appointment     Provider:  Dr.Jordan   

## 2023-02-21 ENCOUNTER — Ambulatory Visit: Payer: Medicare Other

## 2023-02-21 DIAGNOSIS — Z952 Presence of prosthetic heart valve: Secondary | ICD-10-CM | POA: Insufficient documentation

## 2023-02-21 DIAGNOSIS — Z79899 Other long term (current) drug therapy: Secondary | ICD-10-CM | POA: Diagnosis present

## 2023-02-21 DIAGNOSIS — Z5181 Encounter for therapeutic drug level monitoring: Secondary | ICD-10-CM | POA: Insufficient documentation

## 2023-02-21 LAB — POCT INR: INR: 3.9 — AB (ref 2.0–3.0)

## 2023-02-21 NOTE — Patient Instructions (Signed)
HOLD TOMORROW ONLY THEN Continue taking warfarin 1 tablet daily except 1/2 tablet on Monday, Wednesday Friday and Saturday; Recheck INR in 4 weeks. Coumadin Clinic [phone] or [phone].

## 2023-03-17 ENCOUNTER — Encounter: Payer: Self-pay | Admitting: Adult Health

## 2023-03-17 ENCOUNTER — Ambulatory Visit: Payer: Medicare Other | Admitting: Adult Health

## 2023-03-17 VITALS — BP 130/68 | HR 76 | Temp 97.6°F | Ht 69.0 in | Wt 222.0 lb

## 2023-03-17 DIAGNOSIS — M25562 Pain in left knee: Secondary | ICD-10-CM | POA: Diagnosis not present

## 2023-03-17 MED ORDER — METHYLPREDNISOLONE ACETATE 80 MG/ML IJ SUSP
80.0000 mg | Freq: Once | INTRAMUSCULAR | Status: AC
Start: 2023-03-17 — End: 2023-03-17
  Administered 2023-03-17: 80 mg via INTRAMUSCULAR

## 2023-03-17 NOTE — Progress Notes (Signed)
Subjective:    Patient ID: Robert Anderson, male    DOB: 07-Jul-1924, 89 y.o.   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  He presents to the office today for an acute issue. He reports having left knee pain x 2 weeks. He has not noticed any redness, warmth, or swelling. Pain is worse with walking. Pain is located throughout the knee. He denies trauma or falls. He has a history of osteoarthritis.   Review of Systems See HPI   Past Medical History:  Diagnosis Date   Atrial fibrillation (HCC)    a. dx after lead revision 06/26/15 >> anticoag not started due to recent pericardial effusion in setting of perforation and lead revision   Bladder cancer (HCC)    chemo done 2021   Bradycardia    a. HR 30s in clinic 06/2015 - BB discontinued.   DEGENERATIVE JOINT DISEASE, SPINE    ERECTILE DYSFUNCTION    Essential hypertension    First degree AV block    Hearing deficit    wears hearing aides both ears   HEARING LOSS    Heart block    s/p Pacemaker   Hyperlipidemia    Hypertrophic obstructive cardiomyopathy (HCC)    LBBB (left bundle branch block)    Macular degeneration    left eye gets shots for   OA (osteoarthritis)    both hips   Pericardial effusion    a. micorpeforation s/p lead revision  06/26/15   Presence of permanent cardiac pacemaker 06/24/2015   St Jude Assurity   Prostate cancer [facility]) 2010   "external radiation; 40 treatments"   S/P TAVR (transcatheter aortic valve replacement) 03/25/2015   29 mm Edwards Sapien 3 transcatheter heart valve placed via open right transfemoral approach   Severe aortic stenosis    a. s/p TAVR 03/2015. Pre-op  LHC 02/2015: minor nonobstructive CAD.   Squamous cell skin cancer    left lower leg   Stroke (HCC) 02/19/2014   Small infarct high posterior left frontal lobe on MRI     TIA (transient ischemic attack) 01/21/2014   5 min spell aphasia without assoc sx     Uses walker    Wears glasses     Social History   Socioeconomic History   Marital status: Married    Spouse name: Not on file   Number of children: 4   Years of education: 16   Highest education level: Not on file  Occupational History   Occupation: Retired  Tobacco Use   Smoking status: Never   Smokeless tobacco: Never  Vaping Use   Vaping status: Never Used  Substance and Sexual Activity   Alcohol use: No    Alcohol/week: 0.0 standard drinks of alcohol   Drug use: No   Sexual activity: Not Currently  Other Topics Concern   Not on file  Social History Narrative   Patient lives in [place] with his wife. 2nd marriage  4 children    He goes to the [facility] three days a week      09/27/18: Lives at [facility] with wife   Still goes to [facility], 2X/week, bicycles/uses sitting eliptical in room daily   Enjoys singing, part of singing group at center   Active in discussion groups at center         Social Determinants of Health   Financial Resource Strain: Low Risk  (07/19/2022)   Overall Financial Resource Strain (CARDIA)    Difficulty of Paying Living Expenses: Not hard at all  Food Insecurity: No Food Insecurity (07/19/2022)   Hunger Vital Sign    Worried About Running Out of Food in the Last Year: Never true    Ran Out of Food in the Last Year: Never  true  Transportation Needs: No Transportation Needs (07/19/2022)   PRAPARE - Administrator, Civil Service (Medical): No    Lack of Transportation (Non-Medical): No  Physical Activity: Insufficiently Active (07/19/2022)   Exercise Vital Sign    Days of Exercise per Week: 7 days    Minutes of Exercise per Session: 20 min  Stress: No Stress Concern Present (07/19/2022)   [employer] of Occupational Health - Occupational Stress Questionnaire    Feeling of Stress : Not at all  Social Connections: Socially Integrated (07/19/2022)   Social Connection and Isolation Panel [NHANES]    Frequency of Communication with Friends and Family: More than three times a week    Frequency of Social Gatherings with Friends and Family: More than three times a week    Attends Religious Services: More than 4 times per year    Active Member of Clubs or Organizations: Yes    Attends Banker Meetings: More than 4 times per year    Marital Status: Married  Catering manager Violence: Not At Risk (07/19/2022)   Humiliation, Afraid, Rape, and Kick questionnaire    Fear of Current or Ex-Partner: No    Emotionally Abused: No    Physically Abused: No    Sexually Abused: No    Past Surgical History:  Procedure Laterality Date   ANTERIOR CERVICAL DECOMP/DISCECTOMY FUSION  ~ 2000   Dr. Otelia Sergeant with bone graft   CARDIAC CATHETERIZATION N/A 02/20/2015   Procedure: Right/Left Heart Cath and Coronary Angiography;  Surgeon: Tonny Bollman, MD;  Location: [facility] INVASIVE CV LAB;  Service: Cardiovascular;  Laterality: N/A;   CARDIAC VALVE REPLACEMENT     CATARACT EXTRACTION W/ INTRAOCULAR LENS  IMPLANT, BILATERAL  2004; 2010   right; left   CYSTOSCOPY W/ RETROGRADES Bilateral 04/22/2020   Procedure: CYSTOSCOPY WITH RETROGRADE PYELOGRAM/FULGERATION BLADDER BIOPSY;  Surgeon: Belva Agee, MD;  Location: [facility];  Service: Urology;  Laterality: Bilateral;   CYSTOSCOPY W/ RETROGRADES Bilateral 09/09/2020    Procedure: CYSTOSCOPY WITH RETROGRADE PYELOGRAM;  Surgeon: Belva Agee, MD;  [facility];  Service: Urology;  Laterality: Bilateral;   CYSTOSCOPY W/ RETROGRADES Bilateral 10/13/2021   Procedure: CYSTOSCOPY WITH RETROGRADE PYELOGRAM;  Surgeon: Belva Agee, MD;  Location: [facility];  Service: Urology;  Laterality: Bilateral;   EP IMPLANTABLE DEVICE N/A 06/24/2015   Procedure: Pacemaker Implant;  Surgeon: Marinus Maw, MD;  Location: [facility] INVASIVE CV LAB;  Service: Cardiovascular; St Jude Assurity    EP IMPLANTABLE DEVICE N/A 06/26/2015   Procedure: Lead Revision/Repair;  Surgeon: Will Jorja Loa, MD;  Location: [facility] INVASIVE CV LAB;  Service: Cardiovascular;  Laterality: N/A;   INSERT / REPLACE /  REMOVE PACEMAKER  06/24/2015   JOINT REPLACEMENT     SQUAMOUS CELL CARCINOMA EXCISION Left X 3   "lower leg"   TEE WITHOUT CARDIOVERSION N/A 03/07/2015   Procedure: TRANSESOPHAGEAL ECHOCARDIOGRAM (TEE);  Surgeon: Lars Masson, MD;  Location: [facility] ENDOSCOPY;  Service: Cardiovascular;  Laterality: N/A;   TEE WITHOUT CARDIOVERSION N/A 03/25/2015   Procedure: TRANSESOPHAGEAL ECHOCARDIOGRAM (TEE);  Surgeon: Kathleene Hazel, MD;  Location: [facility] OR;  Service: Open Heart Surgery;  Laterality: N/A;   TONSILLECTOMY  as child   TOTAL SHOULDER ARTHROPLASTY Right 2007   "wore it out playing tennis"   TRANSCATHETER AORTIC VALVE REPLACEMENT, TRANSFEMORAL N/A 03/25/2015   Procedure: TRANSCATHETER AORTIC VALVE REPLACEMENT, TRANSFEMORAL;  Surgeon: Kathleene Hazel, MD;  Location: [facility] OR;  Service: Open Heart Surgery;  Laterality: N/A;   TRANSURETHRAL RESECTION OF BLADDER TUMOR N/A 09/09/2020   Procedure: TRANSURETHRAL RESECTION OF BLADDER TUMOR (TURBT);  Surgeon: Belva Agee, MD;  Location[facility];  Service: Urology;  Laterality: N/A;  30 MINS   TRANSURETHRAL RESECTION OF BLADDER TUMOR N/A 10/13/2021   Procedure: TRANSURETHRAL RESECTION OF BLADDER TUMOR  (TURBT);  Surgeon: Belva Agee, MD;  Location: [facility];  Service: Urology;  Laterality: N/A;  30 MINS    Family History  Problem Relation Age of Onset   Heart disease Mother    CVA Mother    Stroke Mother    CVA Father    Cancer Father    Brain cancer Sister    Prostate cancer Brother    Heart disease Brother    Lung cancer Brother    Heart attack Brother    Hypertension Neg Hx     No Known Allergies  Current Outpatient Medications on File Prior to Visit  Medication Sig Dispense Refill   amoxicillin (AMOXIL) 500 MG capsule Take 2,000 mg by mouth See admin instructions. Take 2000 mg 1 hour prior to dental work     mirabegron ER (MYRBETRIQ) 50 MG TB24 tablet Take 50 mg by mouth every other day. At lunch     Multiple Vitamins-Minerals (PRESERVISION AREDS 2+MULTI VIT PO) Take 1 tablet by mouth 2 (two) times daily.     Omega-3 Fatty Acids (FISH OIL) 1000 MG CAPS Take 1,000 mg by mouth 2 (two) times daily.     polyethylene glycol powder (MIRALAX) 17 GM/SCOOP powder Take 1 Container by mouth as needed.     PREVIDENT 5000 SENSITIVE 1.1-5 % PSTE Apply 1 application topically at bedtime.      rosuvastatin (CRESTOR) 5 MG tablet TAKE 1 TABLET BY MOUTH DAILY. 90 tablet 3   silodosin (RAPAFLO) 8 MG CAPS capsule Take 8 mg by mouth every evening. 1 tablet 30 minutes after evening meals     SODIUM FLUORIDE 5000 PLUS 1.1 % CREA dental cream Place 1 Application onto teeth at bedtime.     warfarin (COUMADIN) 3 MG tablet TAKE 1/2 TO 1 TABLET BY MOUTH DAILY AS DIRECTED BY COUMADIN CLINIC 95 tablet 1   No current facility-administered medications on file prior to visit.    BP 130/68   Pulse 76   Temp 97.6 F (36.4 C) (Oral)   Ht 5\' 9"  (1.753 m)   Wt 222 lb (100.7 kg)   SpO2 95%   BMI 32.78 kg/m       Objective:   Physical Exam Vitals and nursing note reviewed.  Constitutional:      Appearance: Normal appearance.  Cardiovascular:     Pulses: Normal pulses.  Musculoskeletal:         General: Tenderness present. No swelling. Normal range of motion.  Skin:    Findings: No erythema.  Neurological:     General: No focal deficit present.     Mental Status: He is alert and oriented to person, place, and time.     Gait: Gait abnormal (walks with rolling walker.).  Psychiatric:        Mood and Affect: Mood normal.        Behavior: Behavior normal.        Thought Content: Thought content normal.        Judgment: Judgment normal.        Assessment & Plan:  1. Acute pain of left knee - likely related to osteoarthritis. We discussed options such as PT, continued use of tylenol or trying a knee injection with steroids. He opted for knee injection  Joint Injection/Arthrocentesis  Date/Time: 03/17/2023 2:30 PM  Performed by: Shirline Frees, NP Authorized by: Shirline Frees, NP  Indications: pain  Body area: knee Joint: left knee Local anesthesia used: yes  Anesthesia: Local anesthesia used: yes Local Anesthetic: topical anesthetic  Sedation: Patient sedated: no  Needle size: 22 G Ultrasound guidance: no Approach: anterior Methylprednisolone amount: 80 mg Lidocaine 1% amount: 2 mL Patient tolerance: patient tolerated the procedure well with no immediate complications    - advised that it may take up to 3 days for steroid to work  - Follow up as needed - methylPREDNISolone acetate (DEPO-MEDROL) injection 80 mg  Shirline Frees, NP

## 2023-03-21 ENCOUNTER — Ambulatory Visit: Payer: Medicare Other | Attending: Cardiovascular Disease

## 2023-03-21 DIAGNOSIS — Z5181 Encounter for therapeutic drug level monitoring: Secondary | ICD-10-CM | POA: Diagnosis not present

## 2023-03-21 DIAGNOSIS — Z952 Presence of prosthetic heart valve: Secondary | ICD-10-CM

## 2023-03-21 DIAGNOSIS — Z79899 Other long term (current) drug therapy: Secondary | ICD-10-CM | POA: Diagnosis present

## 2023-03-21 LAB — POCT INR: INR: 5.4 — AB (ref 2.0–3.0)

## 2023-03-21 NOTE — Patient Instructions (Signed)
HOLD TODAY, TUESDAY and WEDNESDAY  THEN Continue taking warfarin 1 tablet daily except 1/2 tablet on Monday, Wednesday Friday and Saturday; Recheck INR in 4 weeks. Coumadin Clinic [phone] or [phone].

## 2023-03-23 ENCOUNTER — Ambulatory Visit (INDEPENDENT_AMBULATORY_CARE_PROVIDER_SITE_OTHER): Payer: Medicare Other

## 2023-03-23 DIAGNOSIS — I447 Left bundle-branch block, unspecified: Secondary | ICD-10-CM

## 2023-03-23 DIAGNOSIS — I442 Atrioventricular block, complete: Secondary | ICD-10-CM | POA: Diagnosis not present

## 2023-03-25 ENCOUNTER — Encounter: Payer: Self-pay | Admitting: Adult Health

## 2023-03-25 ENCOUNTER — Telehealth (INDEPENDENT_AMBULATORY_CARE_PROVIDER_SITE_OTHER): Payer: Medicare Other | Admitting: Adult Health

## 2023-03-25 ENCOUNTER — Telehealth: Payer: Medicare Other | Admitting: Adult Health

## 2023-03-25 ENCOUNTER — Telehealth: Payer: Self-pay | Admitting: Adult Health

## 2023-03-25 DIAGNOSIS — U071 COVID-19: Secondary | ICD-10-CM

## 2023-03-25 MED ORDER — MOLNUPIRAVIR EUA 200MG CAPSULE
4.0000 | ORAL_CAPSULE | Freq: Two times a day (BID) | ORAL | 0 refills | Status: AC
Start: 2023-03-25 — End: 2023-03-30

## 2023-03-25 NOTE — Telephone Encounter (Signed)
Noted  

## 2023-03-25 NOTE — Telephone Encounter (Signed)
Tested positive for covid yesterday. Attempted to schedule VV, says neither patient nor his spouse have cell phones or internet access. Pt asking for a phone call, advised that insurance may not cover the cost. Pls advise

## 2023-03-25 NOTE — Progress Notes (Signed)
Virtual Visit via Video Note  I connected with Robert Anderson on 03/25/23 at 11:15 AM EDT by a video enabled telemedicine application and verified that I am speaking with the correct person using two identifiers.  Location patient: home Location provider:work or home office Persons participating in the virtual visit: patient, provider  I discussed the limitations of evaluation and management by telemedicine and the availability of in person appointments. The patient expressed understanding and agreed to proceed.   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  He presents to the office today for covid 19 infection. He reports that his symptoms started yesterday. He tested positive yesterday. His symptoms include that of nasal congestion and cough.  His wife is sick with the same symptoms and tested positive    ROS: See pertinent positives and negatives per HPI.  Past Medical History:  Diagnosis Date   Atrial fibrillation [facility])    a. dx after lead revision 06/26/15 >> anticoag not started due to recent pericardial effusion in setting of perforation and lead revision   Bladder cancer (HCC)    chemo done 2021   Bradycardia    a. HR 30s in clinic 06/2015 - BB discontinued.   DEGENERATIVE JOINT DISEASE, SPINE    ERECTILE  DYSFUNCTION    Essential hypertension    First degree AV block    Hearing deficit    wears hearing aides both ears   HEARING LOSS    Heart block    s/p Pacemaker   Hyperlipidemia    Hypertrophic obstructive cardiomyopathy (HCC)    LBBB (left bundle branch block)    Macular degeneration    left eye gets shots for   OA (osteoarthritis)    both hips   Pericardial effusion    a. micorpeforation s/p lead revision 06/26/15   Presence of permanent cardiac pacemaker 06/24/2015   St Jude Assurity   Prostate cancer [facility]) 2010   "external radiation; 40 treatments"   S/P TAVR (transcatheter aortic valve replacement) 03/25/2015   29 mm Edwards Sapien 3 transcatheter heart valve placed via open right transfemoral approach   Severe aortic stenosis    a. s/p TAVR 03/2015. Pre-op  LHC 02/2015: minor nonobstructive CAD.   Squamous cell skin cancer    left lower leg   Stroke (HCC) 02/19/2014   Small infarct high posterior left frontal lobe on MRI     TIA (transient ischemic attack) 01/21/2014   5 min spell aphasia without assoc sx     Uses walker    Wears glasses     Past Surgical History:  Procedure Laterality Date   ANTERIOR CERVICAL DECOMP/DISCECTOMY FUSION  ~ 2000   Dr. Otelia Sergeant with bone graft   CARDIAC CATHETERIZATION N/A 02/20/2015   Procedure: Right/Left Heart Cath and Coronary Angiography;  Surgeon: Tonny Bollman, MD;  Location: [facility] INVASIVE CV LAB;  Service: Cardiovascular;  Laterality: N/A;   CARDIAC VALVE REPLACEMENT     CATARACT EXTRACTION W/ INTRAOCULAR LENS  IMPLANT, BILATERAL  2004; 2010  right; left   CYSTOSCOPY W/ RETROGRADES Bilateral 04/22/2020   Procedure: CYSTOSCOPY WITH RETROGRADE PYELOGRAM/FULGERATION BLADDER BIOPSY;  Surgeon: Belva Agee, MD;  Location: [facility];  Service: Urology;  Laterality: Bilateral;   CYSTOSCOPY W/ RETROGRADES Bilateral 09/09/2020   Procedure: CYSTOSCOPY WITH RETROGRADE PYELOGRAM;  Surgeon: Belva Agee, MD;  Location: [facility];   Service: Urology;  Laterality: Bilateral;   CYSTOSCOPY W/ RETROGRADES Bilateral 10/13/2021   Procedure: CYSTOSCOPY WITH RETROGRADE PYELOGRAM;  Surgeon: Belva Agee, MD;  Location: [facility];  Service: Urology;  Laterality: Bilateral;   EP IMPLANTABLE DEVICE N/A 06/24/2015   Procedure: Pacemaker Implant;  Surgeon: Marinus Maw, MD;  Location: [facility] INVASIVE CV LAB;  Service: Cardiovascular; St Jude Assurity    EP IMPLANTABLE DEVICE N/A 06/26/2015   Procedure: Lead Revision/Repair;  Surgeon: Will Jorja Loa, MD;  Location: [facility] INVASIVE CV LAB;  Service: Cardiovascular;  Laterality: N/A;   INSERT / REPLACE / REMOVE PACEMAKER  06/24/2015   JOINT REPLACEMENT     SQUAMOUS CELL CARCINOMA EXCISION Left X 3   "lower leg"   TEE WITHOUT CARDIOVERSION N/A 03/07/2015   Procedure: TRANSESOPHAGEAL ECHOCARDIOGRAM (TEE);  Surgeon: Lars Masson, MD;  Location: [facility] ENDOSCOPY;  Service: Cardiovascular;  Laterality: N/A;   TEE WITHOUT CARDIOVERSION N/A 03/25/2015   Procedure: TRANSESOPHAGEAL ECHOCARDIOGRAM (TEE);  Surgeon: Kathleene Hazel, MD;  Location: [facility] OR;  Service: Open Heart Surgery;  Laterality: N/A;   TONSILLECTOMY  as child   TOTAL SHOULDER ARTHROPLASTY Right 2007   "wore it out playing tennis"   TRANSCATHETER AORTIC VALVE REPLACEMENT, TRANSFEMORAL N/A 03/25/2015   Procedure: TRANSCATHETER AORTIC VALVE REPLACEMENT, TRANSFEMORAL;  Surgeon: Kathleene Hazel, MD;  Location: [facility] OR;  Service: Open Heart Surgery;  Laterality: N/A;   TRANSURETHRAL RESECTION OF BLADDER TUMOR N/A 09/09/2020   Procedure: TRANSURETHRAL RESECTION OF BLADDER TUMOR (TURBT);  Surgeon: Belva Agee, MD;  Location: [facility];  Service: Urology;  Laterality: N/A;  30 MINS   TRANSURETHRAL RESECTION OF BLADDER TUMOR N/A 10/13/2021   Procedure: TRANSURETHRAL RESECTION OF BLADDER TUMOR (TURBT);  Surgeon: Belva Agee, MD;  Location: [facility];  Service: Urology;  Laterality: N/A;  30 MINS    Family  History  Problem Relation Age of Onset   Heart disease Mother    CVA Mother    Stroke Mother    CVA Father    Cancer Father    Brain cancer Sister    Prostate cancer Brother    Heart disease Brother    Lung cancer Brother    Heart attack Brother    Hypertension Neg Hx        Current Outpatient Medications:    amoxicillin (AMOXIL) 500 MG capsule, Take 2,000 mg by mouth See admin instructions. Take 2000 mg 1 hour prior to dental work, Disp: , Rfl:    mirabegron ER (MYRBETRIQ) 50 MG TB24 tablet, Take 50 mg by mouth every other day. At lunch, Disp: , Rfl:    Multiple Vitamins-Minerals (PRESERVISION AREDS 2+MULTI VIT PO), Take 1 tablet by mouth 2 (two) times daily., Disp: , Rfl:    Omega-3 Fatty Acids (FISH OIL) 1000 MG CAPS, Take 1,000 mg by mouth 2 (two) times daily., Disp: , Rfl:    polyethylene glycol powder (MIRALAX) 17 GM/SCOOP powder, Take 1 Container by mouth as needed., Disp: , Rfl:    PREVIDENT 5000 SENSITIVE 1.1-5 % PSTE, Apply 1 application topically at bedtime. , Disp: , Rfl:    rosuvastatin (  CRESTOR) 5 MG tablet, TAKE 1 TABLET BY MOUTH DAILY., Disp: 90 tablet, Rfl: 3   silodosin (RAPAFLO) 8 MG CAPS capsule, Take 8 mg by mouth every evening. 1 tablet 30 minutes after evening meals, Disp: , Rfl:    SODIUM FLUORIDE 5000 PLUS 1.1 % CREA dental cream, Place 1 Application onto teeth at bedtime., Disp: , Rfl:    warfarin (COUMADIN) 3 MG tablet, TAKE 1/2 TO 1 TABLET BY MOUTH DAILY AS DIRECTED BY COUMADIN CLINIC, Disp: 95 tablet, Rfl: 1  EXAM:  VITALS per patient if applicable:  GENERAL: alert, oriented, appears well and in no acute distress  HEENT: atraumatic, conjunttiva clear, no obvious abnormalities on inspection of external nose and ears  NECK: normal movements of the head and neck  LUNGS: on inspection no signs of respiratory distress, breathing rate appears normal, no obvious gross SOB, gasping or wheezing  CV: no obvious cyanosis  MS: moves all visible extremities  without noticeable abnormality  PSYCH/NEURO: pleasant and cooperative, no obvious depression or anxiety, speech and thought processing grossly intact  ASSESSMENT AND PLAN:  Discussed the following assessment and plan:  1. COVID-19 virus infection  - molnupiravir EUA (LAGEVRIO) 200 mg CAPS capsule; Take 4 capsules (800 mg total) by mouth 2 (two) times daily for 5 days.  Dispense: 40 capsule; Refill: 0      I discussed the assessment and treatment plan with the patient. The patient was provided an opportunity to ask questions and all were answered. The patient agreed with the plan and demonstrated an understanding of the instructions.   The patient was advised to call back or seek an in-person evaluation if the symptoms worsen or if the condition fails to improve as anticipated.   Shirline Frees, NP

## 2023-04-04 ENCOUNTER — Ambulatory Visit: Payer: Medicare Other | Attending: Cardiology

## 2023-04-04 DIAGNOSIS — Z79899 Other long term (current) drug therapy: Secondary | ICD-10-CM | POA: Diagnosis not present

## 2023-04-04 DIAGNOSIS — Z5181 Encounter for therapeutic drug level monitoring: Secondary | ICD-10-CM | POA: Diagnosis not present

## 2023-04-04 DIAGNOSIS — Z952 Presence of prosthetic heart valve: Secondary | ICD-10-CM

## 2023-04-04 LAB — POCT INR: INR: 4.2 — AB (ref 2.0–3.0)

## 2023-04-04 NOTE — Patient Instructions (Signed)
HOLD TODAY and TUESDAY THEN DECREASE TO 0.5 tablet daily except 1 tablet on Monday and Friday; Recheck INR in 4 weeks per patient. Coumadin Clinic [phone] or [phone]. EAT GREENS TWICE PER WEEK.

## 2023-04-07 NOTE — Progress Notes (Signed)
Remote pacemaker transmission.   

## 2023-04-11 ENCOUNTER — Ambulatory Visit: Payer: Medicare Other | Attending: Internal Medicine | Admitting: Internal Medicine

## 2023-04-11 VITALS — BP 126/70 | HR 68 | Ht 69.0 in | Wt 220.0 lb

## 2023-04-11 DIAGNOSIS — I442 Atrioventricular block, complete: Secondary | ICD-10-CM | POA: Diagnosis present

## 2023-04-11 LAB — CUP PACEART INCLINIC DEVICE CHECK
Battery Remaining Longevity: 19 mo
Battery Voltage: 2.86 V
Brady Statistic RA Percent Paced: 22 %
Brady Statistic RV Percent Paced: 99 %
Date Time Interrogation Session: 20240826164300
Implantable Lead Connection Status: 753985
Implantable Lead Connection Status: 753985
Implantable Lead Implant Date: 20161108
Implantable Lead Implant Date: 20161108
Implantable Lead Location: 753859
Implantable Lead Location: 753860
Implantable Pulse Generator Implant Date: 20161108
Lead Channel Impedance Value: 400 Ohm
Lead Channel Impedance Value: 437.5 Ohm
Lead Channel Pacing Threshold Amplitude: 0.5 V
Lead Channel Pacing Threshold Amplitude: 0.75 V
Lead Channel Pacing Threshold Amplitude: 0.75 V
Lead Channel Pacing Threshold Pulse Width: 0.4 ms
Lead Channel Pacing Threshold Pulse Width: 0.5 ms
Lead Channel Pacing Threshold Pulse Width: 0.5 ms
Lead Channel Sensing Intrinsic Amplitude: 12 mV
Lead Channel Sensing Intrinsic Amplitude: 5 mV
Lead Channel Setting Pacing Amplitude: 1 V
Lead Channel Setting Pacing Amplitude: 2 V
Lead Channel Setting Pacing Pulse Width: 0.5 ms
Lead Channel Setting Sensing Sensitivity: 5 mV
Pulse Gen Model: 2240
Pulse Gen Serial Number: 7825693

## 2023-04-11 NOTE — Progress Notes (Signed)
HPI Mr. Hauger returns today for ongoing evaluation of his PPM. He is a pleasant 89 yo man with CHB, s/p PPM insertion who is living at [facility]. He feels well and is exercising regularly, typically about 15 minutes a day. No chest pain or sob. No edema. He has undergone TAVR. He does admit to being a bit more sedentary. No other symptoms though he notes that he feels "lazy" at times.  No Known Allergies   Current Outpatient Medications  Medication Sig Dispense Refill   amoxicillin (AMOXIL) 500 MG capsule Take 2,000 mg by mouth See admin instructions. Take 2000 mg 1 hour prior to dental work     mirabegron ER (MYRBETRIQ) 50 MG TB24 tablet Take 50 mg by mouth every other day. At lunch     Multiple Vitamins-Minerals (PRESERVISION AREDS 2+MULTI VIT PO) Take 1 tablet by mouth 2 (two) times daily.     Omega-3 Fatty Acids (FISH OIL) 1000 MG CAPS Take 1,000 mg by mouth 2 (two) times daily.     polyethylene glycol powder (MIRALAX) 17 GM/SCOOP powder Take 1 Container by mouth as needed.     rosuvastatin (CRESTOR) 5 MG tablet TAKE 1 TABLET BY MOUTH DAILY. 90 tablet 3   silodosin (RAPAFLO) 8 MG CAPS capsule Take 8 mg by mouth every evening. 1 tablet 30 minutes after evening meals     SODIUM FLUORIDE 5000 PLUS 1.1 % CREA dental cream Place 1 Application onto teeth at bedtime.     warfarin (COUMADIN) 3 MG tablet TAKE 1/2 TO 1 TABLET BY MOUTH DAILY AS DIRECTED BY COUMADIN CLINIC 95 tablet 1   No current facility-administered medications for this visit.     Past Medical History:  Diagnosis Date   Atrial fibrillation [facility])    a. dx after lead revision 06/26/15 >> anticoag not started due to recent pericardial effusion in setting of perforation and lead revision   Bladder cancer (HCC)    chemo done 2021   Bradycardia    a. HR 30s in clinic 06/2015 - BB discontinued.   DEGENERATIVE JOINT DISEASE, SPINE    ERECTILE DYSFUNCTION    Essential hypertension    First degree AV block    Hearing  deficit    wears hearing aides both ears   HEARING LOSS    Heart block    s/p Pacemaker   Hyperlipidemia    Hypertrophic obstructive cardiomyopathy (HCC)    LBBB (left bundle branch block)    Macular degeneration    left eye gets shots for   OA (osteoarthritis)    both hips   Pericardial effusion    a. 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 s/p TAVR 03/2015. Pre-op  LHC 02/2015: minor nonobstructive CAD.   Squamous cell skin cancer    left lower leg   Stroke (HCC) 02/19/2014   Small infarct high posterior left frontal lobe on MRI     TIA (transient ischemic attack) 01/21/2014   5 min spell aphasia without assoc sx     Uses walker    Wears glasses     ROS:   All systems reviewed and negative except as  noted in the HPI.   Past Surgical History:  Procedure Laterality Date   ANTERIOR CERVICAL DECOMP/DISCECTOMY FUSION  ~ 2000   Dr. Otelia Sergeant with bone graft   CARDIAC CATHETERIZATION N/A 02/20/2015   Procedure: Right/Left Heart Cath and Coronary Angiography;  Surgeon: Tonny Bollman, MD;  Location: [facility] INVASIVE CV LAB;  Service: Cardiovascular;  Laterality: N/A;   CARDIAC VALVE REPLACEMENT     CATARACT EXTRACTION W/ INTRAOCULAR LENS  IMPLANT, BILATERAL  2004; 2010   right; left   CYSTOSCOPY W/ RETROGRADES Bilateral 04/22/2020   Procedure: CYSTOSCOPY WITH RETROGRADE PYELOGRAM/FULGERATION BLADDER BIOPSY;  Surgeon: Belva Agee, MD;  Location: [facility] ORS;  Service: Urology;  Laterality: Bilateral;   CYSTOSCOPY W/ RETROGRADES Bilateral 09/09/2020   Procedure: CYSTOSCOPY WITH RETROGRADE PYELOGRAM;  Surgeon: Belva Agee, MD;  Location: [facility];   Service: Urology;  Laterality: Bilateral;   CYSTOSCOPY W/ RETROGRADES Bilateral 10/13/2021   Procedure: CYSTOSCOPY WITH RETROGRADE PYELOGRAM;  Surgeon: Belva Agee, MD;  Location: [facility] ORS;  Service: Urology;  Laterality: Bilateral;   EP IMPLANTABLE DEVICE N/A 06/24/2015   Procedure: Pacemaker Implant;  Surgeon: Marinus Maw, MD;  Location: [facility] INVASIVE CV LAB;  Service: Cardiovascular; St Jude Assurity    EP IMPLANTABLE DEVICE N/A 06/26/2015   Procedure: Lead Revision/Repair;  Surgeon: Will Jorja Loa, MD;  Location: [facility] INVASIVE CV LAB;  Service: Cardiovascular;  Laterality: N/A;   INSERT / REPLACE / REMOVE PACEMAKER  06/24/2015   JOINT REPLACEMENT     SQUAMOUS CELL CARCINOMA EXCISION Left X 3   "lower leg"   TEE WITHOUT CARDIOVERSION N/A 03/07/2015   Procedure: TRANSESOPHAGEAL ECHOCARDIOGRAM (TEE);  Surgeon: Lars Masson, MD;  Location: [facility] ENDOSCOPY;  Service: Cardiovascular;  Laterality: N/A;   TEE WITHOUT CARDIOVERSION N/A 03/25/2015   Procedure: TRANSESOPHAGEAL ECHOCARDIOGRAM (TEE);  Surgeon: Kathleene Hazel, MD;  Location: [facility] OR;  Service: Open Heart Surgery;  Laterality: N/A;   TONSILLECTOMY  as child   TOTAL SHOULDER ARTHROPLASTY Right 2007   "wore it out playing tennis"   TRANSCATHETER AORTIC VALVE REPLACEMENT, TRANSFEMORAL N/A 03/25/2015   Procedure: TRANSCATHETER AORTIC VALVE REPLACEMENT, TRANSFEMORAL;  Surgeon: Kathleene Hazel, MD;  Location: [facility] OR;  Service: Open Heart Surgery;  Laterality: N/A;   TRANSURETHRAL RESECTION OF BLADDER TUMOR N/A 09/09/2020   Procedure: TRANSURETHRAL RESECTION OF BLADDER TUMOR (TURBT);  Surgeon: Belva Agee, MD;  Location: [facility];  Service: Urology;  Laterality: N/A;  30 MINS   TRANSURETHRAL RESECTION OF BLADDER TUMOR N/A 10/13/2021   Procedure: TRANSURETHRAL RESECTION OF BLADDER TUMOR (TURBT);  Surgeon: Belva Agee, MD;  Location: [facility] ORS;  Service: Urology;  Laterality: N/A;  30 MINS     Family  History  Problem Relation Age of Onset   Heart disease Mother    CVA Mother    Stroke Mother    CVA Father    Cancer Father    Brain cancer Sister    Prostate cancer Brother    Heart disease Brother    Lung cancer Brother    Heart attack Brother    Hypertension Neg Hx      Social History   Socioeconomic History   Marital status: Married    Spouse name: Not on file   Number of children: 4   Years of education: 16   Highest education level: Not on file  Occupational History   Occupation: Retired  Tobacco Use   Smoking status: Never   Smokeless tobacco: Never  Vaping Use   Vaping status: Never Used  Substance and Sexual Activity   Alcohol use: No    Alcohol/week: 0.0 standard drinks of alcohol   Drug use: No   Sexual activity: Not Currently  Other Topics Concern   Not on file  Social History Narrative   Patient lives in [place] with his wife. 2nd marriage    4 children    He goes to the [facility] three days a week      09/27/18: Lives at [facility] with wife   Still goes to [facility], 2X/week, bicycles/uses sitting eliptical in room daily   Enjoys singing, part of singing group at center   Active in discussion groups at center         Social Determinants of Health   Financial Resource Strain: Low Risk  (07/19/2022)   Overall Financial Resource Strain (CARDIA)    Difficulty of Paying Living Expenses: Not hard at all  Food Insecurity: No Food Insecurity (07/19/2022)   Hunger Vital Sign    Worried About Running Out of Food in the Last Year: Never true    Ran Out of Food in the Last Year: Never true  Transportation Needs: No Transportation Needs (07/19/2022)   PRAPARE - Administrator, Civil Service (Medical): No    Lack of Transportation (Non-Medical): No  Physical Activity: Insufficiently Active (07/19/2022)   Exercise Vital Sign    Days of Exercise per Week: 7 days    Minutes of Exercise per Session: 20 min  Stress: No Stress Concern Present  (07/19/2022)   [employer] of Occupational Health - Occupational Stress Questionnaire    Feeling of Stress : Not at all  Social Connections: Socially Integrated (07/19/2022)   Social Connection and Isolation Panel [NHANES]    Frequency of Communication with Friends and Family: More than three times a week    Frequency of Social Gatherings with Friends and Family: More than three times a week    Attends Religious Services: More than 4 times per year    Active Member of [employer] or Organizations: Yes    Attends Engineer, structural: More than 4 times per year    Marital Status: Married  Catering manager Violence: Not At Risk (07/19/2022)   Humiliation, Afraid, Rape, and Kick questionnaire    Fear of Current or Ex-Partner: No    Emotionally Abused: No    Physically Abused: No    Sexually Abused: No     BP 126/70   Pulse 68   Ht 5\' 9"  (1.753 m)   Wt 220 lb (99.8 kg)   SpO2 96%   BMI 32.49 kg/m   Physical Exam:  Marked scoliosis but otherwise well appearing elderly man, NAD HEENT: Unremarkable Neck:  No JVD, no thyromegally Lymphatics:  No adenopathy Back:  No CVA tenderness Lungs:  Clear with no wheezes HEART:  Regular rate rhythm, no murmurs, no rubs, no clicks Abd:  soft, positive bowel sounds, no organomegally, no rebound, no guarding Ext:  2 plus pulses, no edema, no cyanosis, no clubbing Skin:  No rashes no nodules Neuro:  CN II through XII intact, motor grossly intact  DEVICE  Normal device function.  See PaceArt for details.   Assess/Plan: 1. Atrial fib - his VR is well controlled 2. CHB - He is asymptomatic, s/p catheter ablation. No escape. 3. PPM -his St. Jude DDD PM is working normally.    Sharlot Gowda Jermery Caratachea,MD

## 2023-04-11 NOTE — Patient Instructions (Signed)
 Medication Instructions:  Your physician recommends that you continue on your current medications as directed. Please refer to the Current Medication list given to you today.  *If you need a refill on your cardiac medications before your next appointment, please call your pharmacy*  Lab Work: None ordered.  If you have labs (blood work) drawn today and your tests are completely normal, you will receive your results only by: MyChart Message (if you have MyChart) OR A paper copy in the mail If you have any lab test that is abnormal or we need to change your treatment, we will call you to review the results.  Testing/Procedures: None ordered.  Follow-Up: At [facility], you and your health needs are our priority.  As part of our continuing mission to provide you with exceptional heart care, we have created designated Provider Care Teams.  These Care Teams include your primary Cardiologist (physician) and Advanced Practice Providers (APPs -  Physician Assistants and Nurse Practitioners) who all work together to provide you with the care you need, when you need it.  We recommend signing up for the patient portal called "MyChart".  Sign up information is provided on this After Visit Summary.  MyChart is used to connect with patients for Virtual Visits (Telemedicine).  Patients are able to view lab/test results, encounter notes, upcoming appointments, etc.  Non-urgent messages can be sent to your provider as well.   To learn more about what you can do with MyChart, go to [email or web address].    Your next appointment:   1 year(s)  The format for your next appointment:   In Person  Provider:   Lewayne Bunting, MD{or one of the following Advanced Practice Providers on your designated Care Team:   Francis Dowse, [place] Casimiro Needle "Mardelle Matte" [place], [place] Earnest Rosier, NP   Important Information About Sugar

## 2023-05-02 ENCOUNTER — Ambulatory Visit: Payer: Medicare Other | Attending: Cardiology

## 2023-05-02 DIAGNOSIS — Z952 Presence of prosthetic heart valve: Secondary | ICD-10-CM | POA: Diagnosis present

## 2023-05-02 DIAGNOSIS — Z79899 Other long term (current) drug therapy: Secondary | ICD-10-CM

## 2023-05-02 DIAGNOSIS — Z5181 Encounter for therapeutic drug level monitoring: Secondary | ICD-10-CM

## 2023-05-02 LAB — POCT INR: INR: 2.8 (ref 2.0–3.0)

## 2023-05-02 NOTE — Patient Instructions (Signed)
Continue 0.5 tablet daily except 1 tablet on Monday and Friday; Recheck INR in 6 weeks per patient. Coumadin Clinic [phone] or [phone]. EAT GREENS TWICE PER WEEK.

## 2023-05-14 ENCOUNTER — Other Ambulatory Visit: Payer: Self-pay | Admitting: Cardiology

## 2023-05-14 DIAGNOSIS — E785 Hyperlipidemia, unspecified: Secondary | ICD-10-CM

## 2023-05-14 DIAGNOSIS — I10 Essential (primary) hypertension: Secondary | ICD-10-CM

## 2023-05-14 DIAGNOSIS — Z952 Presence of prosthetic heart valve: Secondary | ICD-10-CM

## 2023-05-27 ENCOUNTER — Other Ambulatory Visit: Payer: Self-pay | Admitting: Cardiology

## 2023-05-27 DIAGNOSIS — I4891 Unspecified atrial fibrillation: Secondary | ICD-10-CM

## 2023-06-13 ENCOUNTER — Ambulatory Visit: Payer: Medicare Other | Attending: Cardiovascular Disease

## 2023-06-13 DIAGNOSIS — Z79899 Other long term (current) drug therapy: Secondary | ICD-10-CM | POA: Diagnosis present

## 2023-06-13 DIAGNOSIS — Z952 Presence of prosthetic heart valve: Secondary | ICD-10-CM | POA: Diagnosis present

## 2023-06-13 DIAGNOSIS — Z5181 Encounter for therapeutic drug level monitoring: Secondary | ICD-10-CM | POA: Diagnosis present

## 2023-06-13 LAB — POCT INR: INR: 2.7 (ref 2.0–3.0)

## 2023-06-13 NOTE — Patient Instructions (Signed)
Continue 0.5 tablet daily except 1 tablet on Monday and Friday; Recheck INR in 8 weeks per patient. Coumadin Clinic [phone] or [phone]. EAT GREENS TWICE PER WEEK.

## 2023-06-22 ENCOUNTER — Ambulatory Visit (INDEPENDENT_AMBULATORY_CARE_PROVIDER_SITE_OTHER): Payer: Medicare Other

## 2023-06-22 DIAGNOSIS — I447 Left bundle-branch block, unspecified: Secondary | ICD-10-CM

## 2023-06-22 LAB — CUP PACEART REMOTE DEVICE CHECK
Battery Remaining Longevity: 13 mo
Battery Remaining Percentage: 13 %
Battery Voltage: 2.84 V
Brady Statistic AP VP Percent: 0 %
Brady Statistic AP VS Percent: 0 %
Brady Statistic AS VP Percent: 0 %
Brady Statistic AS VS Percent: 0 %
Brady Statistic RA Percent Paced: 0 %
Brady Statistic RV Percent Paced: 67 %
Date Time Interrogation Session: 20241106044214
Implantable Lead Connection Status: 753985
Implantable Lead Connection Status: 753985
Implantable Lead Implant Date: 20161108
Implantable Lead Implant Date: 20161108
Implantable Lead Location: 753859
Implantable Lead Location: 753860
Implantable Pulse Generator Implant Date: 20161108
Lead Channel Impedance Value: 380 Ohm
Lead Channel Impedance Value: 430 Ohm
Lead Channel Pacing Threshold Amplitude: 0.75 V
Lead Channel Pacing Threshold Amplitude: 0.75 V
Lead Channel Pacing Threshold Pulse Width: 0.5 ms
Lead Channel Pacing Threshold Pulse Width: 0.5 ms
Lead Channel Sensing Intrinsic Amplitude: 12 mV
Lead Channel Sensing Intrinsic Amplitude: 5 mV
Lead Channel Setting Pacing Amplitude: 1 V
Lead Channel Setting Pacing Amplitude: 2 V
Lead Channel Setting Pacing Pulse Width: 0.5 ms
Lead Channel Setting Sensing Sensitivity: 5 mV
Pulse Gen Model: 2240
Pulse Gen Serial Number: 7825693

## 2023-07-12 NOTE — Progress Notes (Signed)
Remote pacemaker transmission.   

## 2023-07-22 ENCOUNTER — Ambulatory Visit (INDEPENDENT_AMBULATORY_CARE_PROVIDER_SITE_OTHER): Payer: Medicare Other

## 2023-07-22 VITALS — Ht 69.0 in | Wt 220.0 lb

## 2023-07-22 DIAGNOSIS — Z0000 Encounter for general adult medical examination without abnormal findings: Secondary | ICD-10-CM

## 2023-07-22 NOTE — Progress Notes (Signed)
Subjective:   Robert Anderson is a 89 y.o. male who presents for Medicare Annual/Subsequent preventive examination.  Visit Complete: Virtual I connected with  [name] on 07/22/23 by a audio enabled telemedicine application and verified that I am speaking with the correct person using two identifiers.  Patient Location: Home  Provider Location: Home Office  I discussed the limitations of evaluation and management by telemedicine. The patient expressed understanding and agreed to proceed.  Vital Signs: Because this visit was a virtual/telehealth visit, some criteria may be missing or patient reported. Any vitals not documented were not able to be obtained and vitals that have been documented are patient reported.   Cardiac Risk Factors include: advanced age (>69men, >42 women);male gender;hypertension     Objective:    Today's Vitals   07/22/23 1302  Weight: 220 lb (99.8 kg)  Height: 5\' 9"  (1.753 m)   Body mass index is 32.49 kg/m.     07/22/2023    1:11 PM 07/19/2022    1:46 PM 10/08/2021   10:57 AM 08/14/2021    1:14 PM 09/09/2020   10:19 AM 04/17/2020   11:15 AM 09/27/2018    4:36 PM  Advanced Directives  Does Patient Have a Medical Advance Directive? Yes Yes Yes Yes Yes Yes Yes  Type of Estate agent of [place];Living will Healthcare Power of [place];Living will Living will;Healthcare Power of [employer] Power of [place];Living will Healthcare Power of [employer] of [place];Living will Healthcare Power of [place];Living will  Does patient want to make changes to medical advance directive?    No - Patient declined No - Patient declined  No - Patient declined  Copy of Healthcare Power of Attorney in Chart? No - copy requested No - copy requested  No - copy requested   No - copy requested    Current Medications (verified) Outpatient Encounter Medications as of 07/22/2023  Medication Sig   amoxicillin (AMOXIL) 500 MG capsule Take  2,000 mg by mouth See admin instructions. Take 2000 mg 1 hour prior to dental work   mirabegron ER (MYRBETRIQ) 50 MG TB24 tablet Take 50 mg by mouth every other day. At lunch   Multiple Vitamins-Minerals (PRESERVISION AREDS 2+MULTI VIT PO) Take 1 tablet by mouth 2 (two) times daily.   Omega-3 Fatty Acids (FISH OIL) 1000 MG CAPS Take 1,000 mg by mouth 2 (two) times daily.   polyethylene glycol powder (MIRALAX) 17 GM/SCOOP powder Take 1 Container by mouth as needed.   rosuvastatin (CRESTOR) 5 MG tablet TAKE 1 TABLET BY MOUTH DAILY   silodosin (RAPAFLO) 8 MG CAPS capsule Take 8 mg by mouth every evening. 1 tablet 30 minutes after evening meals   SODIUM FLUORIDE 5000 PLUS 1.1 % CREA dental cream Place 1 Application onto teeth at bedtime.   warfarin (COUMADIN) 3 MG tablet TAKE 1/2 TO 1 TABLET BY MOUTH DAILY AS DIRECTED BY COUMADIN CLINIC   No facility-administered encounter medications on file as of 07/22/2023.    Allergies (verified) Patient has no known allergies.   History: Past Medical History:  Diagnosis Date   Atrial fibrillation (HCC)    a. dx after lead revision 06/26/15 >> anticoag not started due to recent pericardial effusion in setting of perforation and lead revision   Bladder cancer (HCC)    chemo done 2021   Bradycardia    a. HR 30s in clinic 06/2015 - BB discontinued.   DEGENERATIVE JOINT DISEASE, SPINE    ERECTILE DYSFUNCTION  Essential hypertension    First degree AV block    Hearing deficit    wears hearing aides both ears   HEARING LOSS    Heart block    s/p Pacemaker   Hyperlipidemia    Hypertrophic obstructive cardiomyopathy (HCC)    LBBB (left bundle branch block)    Macular degeneration    left eye gets shots for   OA (osteoarthritis)    both hips   Pericardial effusion    a. 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 s/p TAVR 03/2015. Pre-op  LHC 02/2015: minor nonobstructive CAD.   Squamous cell skin cancer    left lower leg   Stroke (HCC) 02/19/2014   Small infarct high posterior left frontal lobe on MRI     TIA (transient ischemic attack) 01/21/2014   5 min spell aphasia without assoc sx     Uses walker    Wears glasses    Past Surgical History:  Procedure Laterality Date   ANTERIOR CERVICAL DECOMP/DISCECTOMY FUSION  ~ 2000   Dr. Otelia Sergeant with bone graft   CARDIAC CATHETERIZATION N/A 02/20/2015   Procedure: Right/Left Heart Cath and Coronary Angiography;  Surgeon: Tonny Bollman, MD;  Location: [facility] INVASIVE CV LAB;  Service: Cardiovascular;  Laterality: N/A;   CARDIAC VALVE REPLACEMENT     CATARACT EXTRACTION W/ INTRAOCULAR LENS  IMPLANT, BILATERAL  2004; 2010   right; left   CYSTOSCOPY W/ RETROGRADES Bilateral 04/22/2020   Procedure: CYSTOSCOPY WITH RETROGRADE PYELOGRAM/FULGERATION BLADDER BIOPSY;  Surgeon: Belva Agee, MD;  Location: [facility] ORS;  Service: Urology;  Laterality: Bilateral;   CYSTOSCOPY W/ RETROGRADES Bilateral 09/09/2020   Procedure: CYSTOSCOPY WITH RETROGRADE PYELOGRAM;  Surgeon: Belva Agee, MD;  Location: [facility];  Service: Urology;  Laterality: Bilateral;   CYSTOSCOPY W/ RETROGRADES Bilateral 10/13/2021   Procedure: CYSTOSCOPY WITH RETROGRADE PYELOGRAM;  Surgeon: Belva Agee, MD;  Location: [facility] ORS;  Service: Urology;  Laterality: Bilateral;   EP IMPLANTABLE DEVICE N/A 06/24/2015   Procedure: Pacemaker Implant;  Surgeon: Marinus Maw, MD;  Location: [facility] INVASIVE CV LAB;  Service: Cardiovascular; St Jude Assurity    EP IMPLANTABLE DEVICE N/A 06/26/2015   Procedure: Lead Revision/Repair;  Surgeon: Will Jorja Loa, MD;  Location: [facility] INVASIVE CV LAB;  Service:  Cardiovascular;  Laterality: N/A;   INSERT / REPLACE / REMOVE PACEMAKER  06/24/2015   JOINT REPLACEMENT     SQUAMOUS CELL CARCINOMA EXCISION Left X 3   "lower leg"   TEE WITHOUT CARDIOVERSION N/A 03/07/2015   Procedure: TRANSESOPHAGEAL ECHOCARDIOGRAM (TEE);  Surgeon: Lars Masson, MD;  Location: [facility] ENDOSCOPY;  Service: Cardiovascular;  Laterality: N/A;   TEE WITHOUT CARDIOVERSION N/A 03/25/2015   Procedure: TRANSESOPHAGEAL ECHOCARDIOGRAM (TEE);  Surgeon: Kathleene Hazel, MD;  Location: [facility] OR;  Service: Open Heart Surgery;  Laterality: N/A;   TONSILLECTOMY  as child   TOTAL SHOULDER ARTHROPLASTY Right 2007   "wore it out playing tennis"   TRANSCATHETER AORTIC VALVE REPLACEMENT, TRANSFEMORAL N/A 03/25/2015   Procedure: TRANSCATHETER AORTIC VALVE REPLACEMENT, TRANSFEMORAL;  Surgeon: Kathleene Hazel, MD;  Location: [facility] OR;  Service: Open Heart Surgery;  Laterality: N/A;  TRANSURETHRAL RESECTION OF BLADDER TUMOR N/A 09/09/2020   Procedure: TRANSURETHRAL RESECTION OF BLADDER TUMOR (TURBT);  Surgeon: Belva Agee, MD;  [facility];  Service: Urology;  Laterality: N/A;  30 MINS   TRANSURETHRAL RESECTION OF BLADDER TUMOR N/A 10/13/2021   Procedure: TRANSURETHRAL RESECTION OF BLADDER TUMOR (TURBT);  Surgeon: Belva Agee, MD;  Location: [facility] ORS;  Service: Urology;  Laterality: N/A;  30 MINS   Family History  Problem Relation Age of Onset   Heart disease Mother    CVA Mother    Stroke Mother    CVA Father    Cancer Father    Brain cancer Sister    Prostate cancer Brother    Heart disease Brother    Lung cancer Brother    Heart attack Brother    Hypertension Neg Hx    Social History   Socioeconomic History   Marital status: Married    Spouse name: Not on file   Number of children: 4   Years of education: 16   Highest education level: Not on file  Occupational History   Occupation: Retired  Tobacco Use   Smoking status: Never   Smokeless  tobacco: Never  Vaping Use   Vaping status: Never Used  Substance and Sexual Activity   Alcohol use: No    Alcohol/week: 0.0 standard drinks of alcohol   Drug use: No   Sexual activity: Not Currently  Other Topics Concern   Not on file  Social History Narrative   Patient lives in [place] with his wife. 2nd marriage    4 children    He goes to the [facility] three days a week      09/27/18: Lives at [facility]   Still goes to [facility], 2X/week, bicycles/uses sitting eliptical in room daily   Enjoys singing, part of singing group at center   Active in discussion groups at center         Social Determinants of Health   Financial Resource Strain: Low Risk  (07/22/2023)   Overall Financial Resource Strain (CARDIA)    Difficulty of Paying Living Expenses: Not hard at all  Food Insecurity: No Food Insecurity (07/22/2023)   Hunger Vital Sign    Worried About Running Out of Food in the Last Year: Never true    Ran Out of Food in the Last Year: Never true  Transportation Needs: No Transportation Needs (07/22/2023)   PRAPARE - Administrator, Civil Service (Medical): No    Lack of Transportation (Non-Medical): No  Physical Activity: Insufficiently Active (07/22/2023)   Exercise Vital Sign    Days of Exercise per Week: 7 days    Minutes of Exercise per Session: 10 min  Stress: No Stress Concern Present (07/22/2023)   [employer] of Occupational Health - Occupational Stress Questionnaire    Feeling of Stress : Not at all  Social Connections: Socially Integrated (07/22/2023)   Social Connection and Isolation Panel [NHANES]    Frequency of Communication with Friends and Family: More than three times a week    Frequency of Social Gatherings with Friends and Family: More than three times a week    Attends Religious Services: More than 4 times per year    Active Member of [employer] or Organizations: Yes    Attends Engineer, structural: More than 4 times  per year    Marital Status: Married    Tobacco Counseling Counseling given: Not Answered   Clinical  Intake:  Pre-visit preparation completed: Yes  Pain : No/denies pain     BMI - recorded: 32.49 Nutritional Status: BMI > 30  Obese Nutritional Risks: None Diabetes: No  How often do you need to have someone help you when you read instructions, pamphlets, or other written materials from your doctor or pharmacy?: 1 - Never  Interpreter Needed?: No  Information entered by :: Theresa Mulligan LPN   Activities of Daily Living    07/22/2023    1:09 PM  In your present state of health, do you have any difficulty performing the following activities:  Hearing? 1  Comment Wears hearing aids  Vision? 0  Difficulty concentrating or making decisions? 0  Walking or climbing stairs? 0  Dressing or bathing? 0  Doing errands, shopping? 0  Preparing Food and eating ? N  Using the Toilet? N  In the past six months, have you accidently leaked urine? N  Do you have problems with loss of bowel control? N  Managing your Medications? N  Managing your Finances? N  Housekeeping or managing your Housekeeping? N    Patient Care Team: Shirline Frees, NP as PCP - General (Family Medicine) Marinus Maw, MD as PCP - Electrophysiology (Cardiology) [place], Peter M, MD as PCP - Cardiology (Cardiology) Kathleene Hazel, MD as Consulting Physician (Cardiology) Marinus Maw, MD as Consulting Physician (Clinical Cardiac Electrophysiology) Arminda Resides, MD as Consulting Physician (Dermatology) Lucille Passy, MD as Referring Physician (Ophthalmology) Ihor Gully, MD (Inactive) as Consulting Physician (Urology)  Indicate any recent Medical Services you may have received from other than [facility] providers in the past year (date may be approximate).     Assessment:   This is a routine wellness examination for [place].  Hearing/Vision screen Hearing Screening - Comments:: Wears hearing  aids Vision Screening - Comments:: Wears rx glasses - up to date with routine eye exams with  Dr Lita Mains   Goals Addressed               This Visit's Progress     Stay active (pt-stated)         Depression Screen    07/22/2023    1:08 PM 07/19/2022    1:41 PM 09/07/2021   10:06 AM 08/14/2021    1:08 PM 11/04/2020   10:31 AM 09/27/2018    4:38 PM 08/04/2017    9:29 AM  PHQ 2/9 Scores  PHQ - 2 Score 0 0 0 0 0 0 0  PHQ- 9 Score      1     Fall Risk    07/22/2023    1:10 PM 07/19/2022    1:45 PM 11/11/2021    9:35 AM 09/07/2021   10:06 AM 08/14/2021    1:10 PM  Fall Risk   Falls in the past year? 0 0 0 0 0  Number falls in past yr: 0 0 0  0  Injury with Fall? 0 0 0  0  Risk for fall due to : No Fall Risks No Fall Risks No Fall Risks    Follow up Falls prevention discussed Falls prevention discussed Falls evaluation completed      MEDICARE RISK AT HOME: Medicare Risk at Home Any stairs in or around the home?: Yes If so, are there any without handrails?: No Home free of loose throw rugs in walkways, pet beds, electrical cords, etc?: Yes Adequate lighting in your home to reduce risk of falls?: Yes Life alert?: Yes Use of a  cane, walker or w/c?: Yes Grab bars in the bathroom?: Yes Shower chair or bench in shower?: Yes Elevated toilet seat or a handicapped toilet?: Yes  TIMED UP AND GO:  Was the test performed?  No    Cognitive Function:        07/22/2023    1:13 PM 07/22/2023    1:12 PM 07/19/2022    1:46 PM 08/14/2021    1:12 PM  6CIT Screen  What Year? 0 points 0 points 0 points 0 points  What month? 0 points 0 points 0 points 0 points  What time? 0 points 0 points 0 points 0 points  Count back from 20 0 points 0 points 0 points 0 points  Months in reverse 0 points 0 points 0 points 0 points  Repeat phrase 0 points 0 points 0 points 0 points  Total Score 0 points 0 points 0 points 0 points    Immunizations Immunization History  Administered Date(s)  Administered   H1N1 09/10/2008   Influenza Whole 05/26/2007, 05/17/2008, 05/15/2009   Influenza, High Dose Seasonal PF 05/27/2015, 05/10/2017, 05/17/2018, 05/30/2019, 05/28/2020, 06/01/2021   Influenza,inj,Quad PF,6+ Mos 05/03/2013, 05/16/2014   Influenza-Unspecified 05/16/2016   Pfizer Covid-19 Vaccine Bivalent Booster 11yrs & up 06/07/2021, 01/01/2022, 06/17/2022   Pneumococcal Conjugate-13 05/16/2014   Pneumococcal Polysaccharide-23 08/16/2005   Td 08/16/2005, 05/27/2015   Zoster, Live 10/03/2008    TDAP status: Up to date  Flu Vaccine status: Due, Education has been provided regarding the importance of this vaccine. Advised may receive this vaccine at local pharmacy or Health Dept. Aware to provide a copy of the vaccination record if obtained from local pharmacy or Health Dept. Verbalized acceptance and understanding.  Pneumococcal vaccine status: Up to date  Covid-19 vaccine status: Declined, Education has been provided regarding the importance of this vaccine but patient still declined. Advised may receive this vaccine at local pharmacy or Health Dept.or vaccine clinic. Aware to provide a copy of the vaccination record if obtained from local pharmacy or Health Dept. Verbalized acceptance and understanding.  Qualifies for Shingles Vaccine? Yes   Zostavax completed No   Shingrix Completed?: No.    Education has been provided regarding the importance of this vaccine. Patient has been advised to call insurance company to determine out of pocket expense if they have not yet received this vaccine. Advised may also receive vaccine at local pharmacy or Health Dept. Verbalized acceptance and understanding.  Screening Tests Health Maintenance  Topic Date Due   Zoster Vaccines- Shingrix (1 of 2) 07/14/1943   INFLUENZA VACCINE  03/17/2023   COVID-19 Vaccine (4 - 2023-24 season) 04/17/2023   Medicare Annual Wellness (AWV)  07/21/2024   DTaP/Tdap/Td (3 - Tdap) 05/26/2025   Pneumonia Vaccine  58+ Years old  Completed   HPV VACCINES  Aged Out    Health Maintenance  Health Maintenance Due  Topic Date Due   Zoster Vaccines- Shingrix (1 of 2) 07/14/1943   INFLUENZA VACCINE  03/17/2023   COVID-19 Vaccine (4 - 2023-24 season) 04/17/2023        Additional Screening:    Vision Screening: Recommended annual ophthalmology exams for early detection of glaucoma and other disorders of the eye. Is the patient up to date with their annual eye exam?  Yes  Who is the provider or what is the name of the office in which the patient attends annual eye exams? Dr Lita Mains If pt is not established with a provider, would they like to be referred to a  provider to establish care? No .   Dental Screening: Recommended annual dental exams for proper oral hygiene    Community Resource Referral / Chronic Care Management:  CRR required this visit?  No   CCM required this visit?  No     Plan:     I have personally reviewed and noted the following in the patient's chart:   Medical and social history Use of alcohol, tobacco or illicit drugs  Current medications and supplements including opioid prescriptions. Patient is not currently taking opioid prescriptions. Functional ability and status Nutritional status Physical activity Advanced directives List of other physicians Hospitalizations, surgeries, and ER visits in previous 12 months Vitals Screenings to include cognitive, depression, and falls Referrals and appointments  In addition, I have reviewed and discussed with patient certain preventive protocols, quality metrics, and best practice recommendations. A written personalized care plan for preventive services as well as general preventive health recommendations were provided to patient.     Tillie Rung, LPN   [id number]/03/[id number]   After Visit Summary: (MyChart) Due to this being a telephonic visit, the after visit summary with patients personalized plan was offered to patient via  MyChart   Nurse Notes: None

## 2023-07-22 NOTE — Patient Instructions (Addendum)
[name] , Thank you for taking time to come for your Medicare Wellness Visit. I appreciate your ongoing commitment to your health goals. Please review the following plan we discussed and let me know if I can assist you in the future.   Referrals/Orders/Follow-Ups/Clinician Recommendations:   This is a list of the screening recommended for you and due dates:  Health Maintenance  Topic Date Due   Zoster (Shingles) Vaccine (1 of 2) 07/14/1943   Flu Shot  03/17/2023   COVID-19 Vaccine (4 - 2023-24 season) 04/17/2023   Medicare Annual Wellness Visit  07/21/2024   DTaP/Tdap/Td vaccine (3 - Tdap) 05/26/2025   Pneumonia Vaccine  Completed   HPV Vaccine  Aged Out    Advanced directives: (Copy Requested) Please bring a copy of your health care power of attorney and living will to the office to be added to your chart at your convenience.  Next Medicare Annual Wellness Visit scheduled for next year: Yes

## 2023-08-08 ENCOUNTER — Ambulatory Visit: Payer: Medicare Other | Attending: Cardiology

## 2023-08-08 DIAGNOSIS — Z79899 Other long term (current) drug therapy: Secondary | ICD-10-CM | POA: Diagnosis present

## 2023-08-08 DIAGNOSIS — Z5181 Encounter for therapeutic drug level monitoring: Secondary | ICD-10-CM

## 2023-08-08 DIAGNOSIS — Z952 Presence of prosthetic heart valve: Secondary | ICD-10-CM | POA: Diagnosis present

## 2023-08-08 LAB — POCT INR: INR: 2.5 (ref 2.0–3.0)

## 2023-08-08 NOTE — Patient Instructions (Signed)
Continue 0.5 tablet daily except 1 tablet on Monday and Friday; Recheck INR in 8 weeks per patient. Coumadin Clinic [phone] or [phone]. EAT GREENS TWICE PER WEEK.

## 2023-08-29 ENCOUNTER — Other Ambulatory Visit: Payer: Self-pay

## 2023-08-29 DIAGNOSIS — Z952 Presence of prosthetic heart valve: Secondary | ICD-10-CM

## 2023-08-29 DIAGNOSIS — I4891 Unspecified atrial fibrillation: Secondary | ICD-10-CM

## 2023-08-29 DIAGNOSIS — I10 Essential (primary) hypertension: Secondary | ICD-10-CM

## 2023-08-29 DIAGNOSIS — E785 Hyperlipidemia, unspecified: Secondary | ICD-10-CM

## 2023-08-29 MED ORDER — WARFARIN SODIUM 3 MG PO TABS: ORAL_TABLET | ORAL | 1 refills | Status: DC

## 2023-08-29 MED ORDER — ROSUVASTATIN CALCIUM 5 MG PO TABS: 5.0000 mg | ORAL_TABLET | Freq: Every day | ORAL | 1 refills | Status: DC

## 2023-09-21 ENCOUNTER — Ambulatory Visit: Payer: Medicare Other

## 2023-09-21 DIAGNOSIS — I447 Left bundle-branch block, unspecified: Secondary | ICD-10-CM | POA: Diagnosis not present

## 2023-09-21 LAB — CUP PACEART REMOTE DEVICE CHECK
Battery Remaining Longevity: 10 mo
Battery Remaining Percentage: 9 %
Battery Voltage: 2.81 V
Brady Statistic AP VP Percent: 0 %
Brady Statistic AP VS Percent: 0 %
Brady Statistic AS VP Percent: 0 %
Brady Statistic AS VS Percent: 0 %
Brady Statistic RA Percent Paced: 0 %
Brady Statistic RV Percent Paced: 59 %
Date Time Interrogation Session: 20250205040015
Implantable Lead Connection Status: 753985
Implantable Lead Connection Status: 753985
Implantable Lead Implant Date: 20161108
Implantable Lead Implant Date: 20161108
Implantable Lead Location: 753859
Implantable Lead Location: 753860
Implantable Pulse Generator Implant Date: 20161108
Lead Channel Impedance Value: 400 Ohm
Lead Channel Impedance Value: 440 Ohm
Lead Channel Pacing Threshold Amplitude: 0.625 V
Lead Channel Pacing Threshold Amplitude: 0.75 V
Lead Channel Pacing Threshold Pulse Width: 0.5 ms
Lead Channel Pacing Threshold Pulse Width: 0.5 ms
Lead Channel Sensing Intrinsic Amplitude: 12 mV
Lead Channel Sensing Intrinsic Amplitude: 5 mV
Lead Channel Setting Pacing Amplitude: 0.875
Lead Channel Setting Pacing Amplitude: 2 V
Lead Channel Setting Pacing Pulse Width: 0.5 ms
Lead Channel Setting Sensing Sensitivity: 5 mV
Pulse Gen Model: 2240
Pulse Gen Serial Number: 7825693

## 2023-10-03 ENCOUNTER — Ambulatory Visit: Payer: Medicare Other | Attending: Cardiovascular Disease

## 2023-10-03 DIAGNOSIS — Z952 Presence of prosthetic heart valve: Secondary | ICD-10-CM | POA: Diagnosis present

## 2023-10-03 DIAGNOSIS — Z79899 Other long term (current) drug therapy: Secondary | ICD-10-CM

## 2023-10-03 DIAGNOSIS — Z5181 Encounter for therapeutic drug level monitoring: Secondary | ICD-10-CM

## 2023-10-03 LAB — POCT INR: INR: 2.8 (ref 2.0–3.0)

## 2023-10-03 NOTE — Patient Instructions (Signed)
Continue 0.5 tablet daily except 1 tablet on Monday and Friday; Recheck INR in 8 weeks per patient. Coumadin Clinic [phone] or [phone]. EAT GREENS TWICE PER WEEK.

## 2023-10-28 NOTE — Progress Notes (Signed)
 Remote pacemaker transmission.

## 2023-11-17 ENCOUNTER — Encounter: Payer: Medicare Other | Admitting: Adult Health

## 2023-11-17 ENCOUNTER — Telehealth: Payer: Self-pay | Admitting: Internal Medicine

## 2023-11-17 NOTE — Telephone Encounter (Signed)
 Spoke with patient. He is going to reach out to [facility] to confirm if this is something he should use.  With being in close proximity to device and he is VERY DEPENDENT, would be best to make sure.   Patient verbalizes to understanding.

## 2023-11-17 NOTE — Progress Notes (Deleted)
 Subjective:    Patient ID: Robert Anderson, male    DOB: 29-Aug-1923, 89 y.o.   MRN: [id number]  HPI Patient presents for yearly preventative medicine examination. 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  Aortic valve disease-status post TAVR in 2016.  He is managed by cardiology.  Denies chest pain, shortness of breath, DOE, dizziness, or palpitations  PAF-  anticoagulation with Coumadin. He has not had significant palpitations.   Complete heart block-has pacemaker.  Managed by cardiology  Hypertension-controlled with Toprol 25 mg daily BP Readings from Last 3 Encounters:  04/11/23 126/70  03/17/23 130/68  02/15/23 118/64    Hyperlipidemia-takes Crestor 5 mg daily.  He denies myalgia dose Lab Results  Component Value Date   CHOL 122 09/06/2022   HDL 45 09/06/2022   LDLCALC 45 09/06/2022   LDLDIRECT 69.0 11/04/2020   TRIG 195 (H) 09/06/2022   CHOLHDL 2.7 09/06/2022    History of bladder cancer-was diagnosed with bladder cancer in September 2021 and underwent cystoscopy followed by 6 weeks of chemotherapy which he completed without adverse effects.  He had a transurethral resection of the bladder tumor in January 2022 and repeat in February 2023 for recurrent of bladder cancer.  Currently prescribed  Myrbetriq and Rapaflo 8 mg capsules. Flomax was stopped due to hypotension. He is getting up 6 times a night to urinate and he is fine with this.    All immunizations and health maintenance protocols were reviewed with the patient and needed orders were placed.  Appropriate screening laboratory values were ordered for the patient including screening of hyperlipidemia, renal function and hepatic function.   Medication reconciliation,  past medical history, social history, problem list and allergies were reviewed in detail with the patient  Goals were established with regard to weight loss, exercise, and  diet in compliance with medications Wt Readings from Last 3 Encounters:  07/22/23 220 lb (99.8 kg)  04/11/23 220 lb (99.8 kg)  03/17/23 222 lb (100.7 kg)    Review of Systems  Constitutional: Negative.   HENT: Negative.    Eyes: Negative.   Respiratory: Negative.    Cardiovascular: Negative.   Gastrointestinal: Negative.   Endocrine: Negative.   Genitourinary: Negative.   Musculoskeletal:  Positive for arthralgias, back pain and gait problem.  Skin: Negative.   Allergic/Immunologic: Negative.   Hematological: Negative.   Psychiatric/Behavioral: Negative.    All other systems reviewed and are negative.  Past Medical History:  Diagnosis Date   Atrial fibrillation [facility])    a. dx after lead revision 06/26/15 >> anticoag not started due to recent pericardial effusion in setting of perforation and lead revision   Bladder cancer (HCC)    chemo done 2021   Bradycardia    a. HR 30s in clinic 06/2015 - BB discontinued.   DEGENERATIVE JOINT DISEASE, SPINE    ERECTILE DYSFUNCTION    Essential hypertension    First degree AV block    Hearing deficit    wears hearing  aides both ears   HEARING LOSS    Heart block    s/p Pacemaker   Hyperlipidemia    Hypertrophic obstructive cardiomyopathy (HCC)    LBBB (left bundle branch block)    Macular degeneration    left eye gets shots for    OA (osteoarthritis)    both hips   Pericardial effusion    a. micorpeforation s/p lead revision 06/26/15   Presence of permanent cardiac pacemaker 06/24/2015   St Jude Assurity   Prostate cancer [facility]) 2010   "external radiation; 40 treatments"   S/P TAVR (transcatheter aortic valve replacement) 03/25/2015   29 mm Edwards Sapien 3 transcatheter heart valve placed via open right transfemoral approach   Severe aortic stenosis    a. s/p TAVR 03/2015. Pre-op  LHC 02/2015: minor nonobstructive CAD.   Squamous cell skin cancer    left lower leg   Stroke (HCC) 02/19/2014   Small infarct high posterior left frontal lobe on MRI     TIA (transient ischemic attack) 01/21/2014   5 min spell aphasia without assoc sx     Uses walker    Wears glasses     Social History   Socioeconomic History   Marital status: Married    Spouse name: Not on file   Number of children: 4   Years of education: 16   Highest education level: Not on file  Occupational History   Occupation: Retired  Tobacco Use   Smoking status: Never   Smokeless tobacco: Never  Vaping Use   Vaping status: Never Used  Substance and Sexual Activity   Alcohol use: No    Alcohol/week: 0.0 standard drinks of alcohol   Drug use: No   Sexual activity: Not Currently  Other Topics Concern   Not on file  Social History Narrative   Patient lives in [place] with his wife. 2nd marriage    4 children    He goes to the [facility] three days a week      09/27/18: Lives at [facility] with wife   Still goes to [facility], 2X/week, bicycles/uses sitting eliptical in room daily   Enjoys singing, part of singing group at center   Active in discussion groups at center         Social Drivers of Health   Financial Resource Strain: Low Risk  (07/22/2023)   Overall Financial Resource Strain (CARDIA)    Difficulty of Paying Living Expenses: Not hard at all  Food Insecurity: No Food Insecurity (07/22/2023)   Hunger Vital Sign    Worried  About Running Out of Food in the Last Year: Never true    Ran Out of Food in the Last Year: Never true  Transportation Needs: No Transportation Needs (07/22/2023)   PRAPARE - Administrator, Civil Service (Medical): No    Lack of Transportation (Non-Medical): No  Physical Activity: Insufficiently Active (07/22/2023)   Exercise Vital Sign    Days of Exercise per Week: 7 days    Minutes of Exercise per Session: 10 min  Stress: No Stress Concern Present (07/22/2023)   [employer] of Occupational Health - Occupational Stress Questionnaire    Feeling of Stress : Not at all  Social Connections: Socially Integrated (07/22/2023)   Social Connection and Isolation Panel [NHANES]    Frequency of Communication with Friends and Family: More than three times a week    Frequency of Social Gatherings with Friends and Family: More than three times a week  Attends Religious Services: More than 4 times per year    Active Member of Clubs or Organizations: Yes    Attends Banker Meetings: More than 4 times per year    Marital Status: Married  Catering manager Violence: Not At Risk (07/22/2023)   Humiliation, Afraid, Rape, and Kick questionnaire    Fear of Current or Ex-Partner: No    Emotionally Abused: No    Physically Abused: No    Sexually Abused: No    Past Surgical History:  Procedure Laterality Date   ANTERIOR CERVICAL DECOMP/DISCECTOMY FUSION  ~ 2000   Dr. Otelia Sergeant with bone graft   CARDIAC CATHETERIZATION N/A 02/20/2015   Procedure: Right/Left Heart Cath and Coronary Angiography;  Surgeon: Tonny Bollman, MD;  Location: [facility] INVASIVE CV LAB;  Service: Cardiovascular;  Laterality: N/A;   CARDIAC VALVE REPLACEMENT     CATARACT EXTRACTION W/ INTRAOCULAR LENS  IMPLANT, BILATERAL  2004; 2010   right; left   CYSTOSCOPY W/ RETROGRADES Bilateral 04/22/2020   Procedure: CYSTOSCOPY WITH RETROGRADE PYELOGRAM/FULGERATION BLADDER BIOPSY;  Surgeon: Belva Agee, MD;  Location: [facility]  ORS;  Service: Urology;  Laterality: Bilateral;   CYSTOSCOPY W/ RETROGRADES Bilateral 09/09/2020   Procedure: CYSTOSCOPY WITH RETROGRADE PYELOGRAM;  Surgeon: Belva Agee, MD;  Location: [facility];  Service: Urology;  Laterality: Bilateral;   CYSTOSCOPY W/ RETROGRADES Bilateral 10/13/2021   Procedure: CYSTOSCOPY WITH RETROGRADE PYELOGRAM;  Surgeon: Belva Agee, MD;  Location: [facility] ORS;  Service: Urology;  Laterality: Bilateral;   EP IMPLANTABLE DEVICE N/A 06/24/2015   Procedure: Pacemaker Implant;  Surgeon: Marinus Maw, MD;  Location: [facility] INVASIVE CV LAB;  Service: Cardiovascular; St Jude Assurity    EP IMPLANTABLE DEVICE N/A 06/26/2015   Procedure: Lead Revision/Repair;  Surgeon: Will Jorja Loa, MD;  Location: [facility] INVASIVE CV LAB;  Service: Cardiovascular;  Laterality: N/A;   INSERT / REPLACE / REMOVE PACEMAKER  06/24/2015   JOINT REPLACEMENT     SQUAMOUS CELL CARCINOMA EXCISION Left X 3   "lower leg"   TEE WITHOUT CARDIOVERSION N/A 03/07/2015   Procedure: TRANSESOPHAGEAL ECHOCARDIOGRAM (TEE);  Surgeon: Lars Masson, MD;  Location: [facility] ENDOSCOPY;  Service: Cardiovascular;  Laterality: N/A;   TEE WITHOUT CARDIOVERSION N/A 03/25/2015   Procedure: TRANSESOPHAGEAL ECHOCARDIOGRAM (TEE);  Surgeon: Kathleene Hazel, MD;  Location: [facility] OR;  Service: Open Heart Surgery;  Laterality: N/A;   TONSILLECTOMY  as child   TOTAL SHOULDER ARTHROPLASTY Right 2007   "wore it out playing tennis"   TRANSCATHETER AORTIC VALVE REPLACEMENT, TRANSFEMORAL N/A 03/25/2015   Procedure: TRANSCATHETER AORTIC VALVE REPLACEMENT, TRANSFEMORAL;  Surgeon: Kathleene Hazel, MD;  Location: [facility] OR;  Service: Open Heart Surgery;  Laterality: N/A;   TRANSURETHRAL RESECTION OF BLADDER TUMOR N/A 09/09/2020   Procedure: TRANSURETHRAL RESECTION OF BLADDER TUMOR (TURBT);  Surgeon: Belva Agee, MD;  Location: [facility];  Service: Urology;  Laterality: N/A;  30 MINS   TRANSURETHRAL  RESECTION OF BLADDER TUMOR N/A 10/13/2021   Procedure: TRANSURETHRAL RESECTION OF BLADDER TUMOR (TURBT);  Surgeon: Belva Agee, MD;  Location: [facility] ORS;  Service: Urology;  Laterality: N/A;  30 MINS    Family History  Problem Relation Age of Onset   Heart disease Mother    CVA Mother    Stroke Mother    CVA Father    Cancer Father    Brain cancer Sister    Prostate cancer Brother    Heart disease Brother    Lung cancer  Brother    Heart attack Brother    Hypertension Neg Hx     No Known Allergies  Current Outpatient Medications on File Prior to Visit  Medication Sig Dispense Refill   amoxicillin (AMOXIL) 500 MG capsule Take 2,000 mg by mouth See admin instructions. Take 2000 mg 1 hour prior to dental work     mirabegron ER (MYRBETRIQ) 50 MG TB24 tablet Take 50 mg by mouth every other day. At lunch     Multiple Vitamins-Minerals (PRESERVISION AREDS 2+MULTI VIT PO) Take 1 tablet by mouth 2 (two) times daily.     Omega-3 Fatty Acids (FISH OIL) 1000 MG CAPS Take 1,000 mg by mouth 2 (two) times daily.     polyethylene glycol powder (MIRALAX) 17 GM/SCOOP powder Take 1 Container by mouth as needed.     rosuvastatin (CRESTOR) 5 MG tablet Take 1 tablet (5 mg total) by mouth daily. 90 tablet 1   silodosin (RAPAFLO) 8 MG CAPS capsule Take 8 mg by mouth every evening. 1 tablet 30 minutes after evening meals     SODIUM FLUORIDE 5000 PLUS 1.1 % CREA dental cream Place 1 Application onto teeth at bedtime.     warfarin (COUMADIN) 3 MG tablet TAKE 1/2 TO 1 TABLET BY MOUTH DAILY AS DIRECTED BY COUMADIN CLINIC 95 tablet 1   No current facility-administered medications on file prior to visit.    There were no vitals taken for this visit.      Objective:   Physical Exam Vitals and nursing note reviewed.  Constitutional:      General: He is not in acute distress.    Appearance: Normal appearance. He is not ill-appearing.  HENT:     Head: Normocephalic and atraumatic.     Right Ear:  Tympanic membrane, ear canal and external ear normal. There is no impacted cerumen.     Left Ear: Tympanic membrane, ear canal and external ear normal. There is no impacted cerumen.     Nose: Nose normal. No congestion or rhinorrhea.     Mouth/Throat:     Mouth: Mucous membranes are moist.     Pharynx: Oropharynx is clear.  Eyes:     Extraocular Movements: Extraocular movements intact.     Conjunctiva/sclera: Conjunctivae normal.     Pupils: Pupils are equal, round, and reactive to light.  Neck:     Vascular: No carotid bruit.  Cardiovascular:     Rate and Rhythm: Normal rate and regular rhythm.     Pulses: Normal pulses.     Heart sounds: No murmur heard.    No friction rub. No gallop.  Pulmonary:     Effort: Pulmonary effort is normal.     Breath sounds: Normal breath sounds.  Abdominal:     General: Abdomen is flat. Bowel sounds are normal. There is no distension.     Palpations: Abdomen is soft. There is no mass.     Tenderness: There is no abdominal tenderness. There is no guarding or rebound.     Hernia: No hernia is present.  Musculoskeletal:        General: Normal range of motion.     Cervical back: Normal range of motion and neck supple.  Lymphadenopathy:     Cervical: No cervical adenopathy.  Skin:    General: Skin is warm and dry.     Capillary Refill: Capillary refill takes less than 2 seconds.  Neurological:     General: No focal deficit present.     Mental  Status: He is alert and oriented to person, place, and time.  Psychiatric:        Mood and Affect: Mood normal.        Behavior: Behavior normal.        Thought Content: Thought content normal.        Judgment: Judgment normal.           Assessment & Plan:

## 2023-11-17 NOTE — Telephone Encounter (Signed)
 Patient stated the muscle in his neck has been sore and he purchased a neck warmer and wants to know if this will be safe for him to use on a low setting with his pacemaker.

## 2023-11-20 NOTE — Progress Notes (Unsigned)
 Cardiology Office Note   Date:  11/23/2023   ID:  Robert Anderson, DOB 1924/07/02, MRN [id number]  PCP:  Shirline Frees, NP  Cardiologist:   Yuritzy Zehring [place], MD   No chief complaint on file.     History of Present Illness: Robert Anderson is a 89 y.o. male who presents for follow up heart block and prior TAVR.  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 History of statin intolerance. He has recurrent bladder CA and is s/p chemotherapy and resection. Echo in January 2022 showed marked basal septal hypertrophy without outflow gradient. Normal EF. Normal functioning AV prosthesis. Pacemaker followed by Dr Ladona Ridgel.   He is living at [facility]. Retired from [employer]. Originally from [place]. Has 4 children by his first marriage. Married again 38 years ago.   He did undergo cystoscopy and TURP in Feb 2023 for transitional cell CA of the bladder.   He is doing well. Notes infrequent symptoms of lightheadedness that resolves if he increases his fluid intake. Last pacer check in Feb show did persistent Afib with  rate controlled. Estimated battery longevity 10 months.  He still rides bike daily for 7 minutes.  Past Medical History:  Diagnosis Date   Atrial fibrillation [facility])    a. dx after lead revision 06/26/15 >> anticoag not started due to recent pericardial effusion in setting of perforation and lead revision   Bladder cancer (HCC)    chemo done 2021   Bradycardia    a. HR 30s in clinic 06/2015 - BB discontinued.   DEGENERATIVE JOINT DISEASE, SPINE    ERECTILE DYSFUNCTION    Essential hypertension    First degree AV block    Hearing deficit    wears hearing aides both ears   HEARING LOSS    Heart block    s/p Pacemaker   Hyperlipidemia    Hypertrophic obstructive cardiomyopathy (HCC)     LBBB (left bundle branch block)    Macular degeneration    left eye gets shots for   OA (osteoarthritis)    both hips   Pericardial effusion    a. micorpeforation s/p lead revision 06/26/15   Presence of permanent cardiac pacemaker 06/24/2015   St Jude Assurity   Prostate cancer [facility]) 2010   "external radiation; 40 treatments"   S/P TAVR (transcatheter aortic valve replacement) 03/25/2015   29 mm Edwards Sapien 3 transcatheter heart valve placed via open right transfemoral approach   Severe aortic stenosis    a. s/p TAVR 03/2015. Pre-op  LHC 02/2015: minor nonobstructive CAD.   Squamous cell skin cancer    left lower leg   Stroke (HCC) 02/19/2014   Small infarct high posterior left frontal lobe on MRI     TIA (transient ischemic attack) 01/21/2014   5 min spell aphasia without assoc sx     Uses walker    Wears glasses     Past Surgical History:  Procedure Laterality Date   ANTERIOR CERVICAL DECOMP/DISCECTOMY FUSION  ~ 2000   Dr. Otelia Sergeant with bone graft   CARDIAC CATHETERIZATION N/A 02/20/2015   Procedure: Right/Left Heart Cath and Coronary Angiography;  Surgeon: Tonny Bollman, MD;  Location: [facility] INVASIVE CV LAB;  Service: Cardiovascular;  Laterality: N/A;   CARDIAC VALVE REPLACEMENT     CATARACT EXTRACTION W/ INTRAOCULAR LENS  IMPLANT, BILATERAL  2004; 2010   right; left   CYSTOSCOPY W/ RETROGRADES Bilateral 04/22/2020   Procedure: CYSTOSCOPY WITH RETROGRADE PYELOGRAM/FULGERATION BLADDER BIOPSY;  Surgeon: Belva Agee, MD;  Location: [facility]S;  Service: Urology;  Laterality: Bilateral;   CYSTOSCOPY W/ RETROGRADES Bilateral 09/09/2020   Procedure: CYSTOSCOPY WITH RETROGRADE PYELOGRAM;  Surgeon: Belva Agee, MD;  Location: [facility];  Service: Urology;  Laterality: Bilateral;   CYSTOSCOPY W/ RETROGRADES Bilateral 10/13/2021   Procedure: CYSTOSCOPY WITH RETROGRADE PYELOGRAM;  Surgeon: Belva Agee, MD;  Location: [facility];  Service: Urology;  Laterality: Bilateral;    EP IMPLANTABLE DEVICE N/A 06/24/2015   Procedure: Pacemaker Implant;  Surgeon: Marinus Maw, MD;  Location: [facility] INVASIVE CV LAB;  Service: Cardiovascular; St Jude Assurity    EP IMPLANTABLE DEVICE N/A 06/26/2015   Procedure: Lead Revision/Repair;  Surgeon: Will Jorja Loa, MD;  Location: [facility] INVASIVE CV LAB;  Service: Cardiovascular;  Laterality: N/A;   INSERT / REPLACE / REMOVE PACEMAKER  06/24/2015   JOINT REPLACEMENT     SQUAMOUS CELL CARCINOMA EXCISION Left X 3   "lower leg"   TEE WITHOUT CARDIOVERSION N/A 03/07/2015   Procedure: TRANSESOPHAGEAL ECHOCARDIOGRAM (TEE);  Surgeon: Lars Masson, MD;  Location: [facility] ENDOSCOPY;  Service: Cardiovascular;  Laterality: N/A;   TEE WITHOUT CARDIOVERSION N/A 03/25/2015   Procedure: TRANSESOPHAGEAL ECHOCARDIOGRAM (TEE);  Surgeon: Kathleene Hazel, MD;  Location: [facility] OR;  Service: Open Heart Surgery;  Laterality: N/A;   TONSILLECTOMY  as child   TOTAL SHOULDER ARTHROPLASTY Right 2007   "wore it out playing tennis"   TRANSCATHETER AORTIC VALVE REPLACEMENT, TRANSFEMORAL N/A 03/25/2015   Procedure: TRANSCATHETER AORTIC VALVE REPLACEMENT, TRANSFEMORAL;  Surgeon: Kathleene Hazel, MD;  Location: [facility] OR;  Service: Open Heart Surgery;  Laterality: N/A;   TRANSURETHRAL RESECTION OF BLADDER TUMOR N/A 09/09/2020   Procedure: TRANSURETHRAL RESECTION OF BLADDER TUMOR (TURBT);  Surgeon: Belva Agee, MD;  Location: [facility];  Service: Urology;  Laterality: N/A;  30 MINS   TRANSURETHRAL RESECTION OF BLADDER TUMOR N/A 10/13/2021   Procedure: TRANSURETHRAL RESECTION OF BLADDER TUMOR (TURBT);  Surgeon: Belva Agee, MD;  Location: [facility];  Service: Urology;  Laterality: N/A;  30 MINS     Current Outpatient Medications  Medication Sig Dispense Refill   mirabegron ER (MYRBETRIQ) 50 MG TB24 tablet Take 50 mg by mouth every other day. At lunch     Multiple Vitamins-Minerals (PRESERVISION AREDS 2+MULTI VIT PO) Take 1 tablet by mouth 2  (two) times daily.     Omega-3 Fatty Acids (FISH OIL) 1000 MG CAPS Take 1,000 mg by mouth 2 (two) times daily.     polyethylene glycol powder (MIRALAX) 17 GM/SCOOP powder Take 1 Container by mouth as needed.     rosuvastatin (CRESTOR) 5 MG tablet Take 1 tablet (5 mg total) by mouth daily. 90 tablet 1   silodosin (RAPAFLO) 8 MG CAPS capsule Take 8 mg by mouth every evening. 1 tablet 30 minutes after evening meals     SODIUM FLUORIDE 5000 PLUS 1.1 % CREA dental cream Place 1 Application onto teeth at bedtime.     warfarin (COUMADIN) 3 MG tablet TAKE 1/2 TO 1 TABLET BY MOUTH DAILY AS DIRECTED BY COUMADIN CLINIC 95 tablet 1   amoxicillin (AMOXIL) 500 MG capsule Take 2,000 mg by mouth See admin instructions. Take 2000 mg 1 hour prior to dental work (Patient not taking: Reported on 11/23/2023)     No current  facility-administered medications for this visit.    Allergies:   Patient has no known allergies.    Social History:  The patient  reports that he has never smoked. He has never used smokeless tobacco. He reports that he does not drink alcohol and does not use drugs.   Family History:  The patient's family history includes Brain cancer in his sister; CVA in his father and mother; Cancer in his father; Heart attack in his brother; Heart disease in his brother and mother; Lung cancer in his brother; Prostate cancer in his brother; Stroke in his mother.    ROS:  Please see the history of present illness.   Otherwise, review of systems are positive for none.   All other systems are reviewed and negative.    PHYSICAL EXAM: VS:  BP 105/67 (BP Location: Left Arm, Patient Position: Sitting, Cuff Size: Normal)   Pulse 68   Wt 211 lb 3.2 oz (95.8 kg)   SpO2 93%   BMI 31.19 kg/m  , BMI Body mass index is 31.19 kg/m. GEN: Well nourished, well developed, in no acute distress HEENT: normal Neck: no JVD, carotid bruits, or masses Cardiac: RRR; no murmurs, rubs, or gallops,no edema  Respiratory:   clear to auscultation bilaterally, normal work of breathing GI: soft, nontender, nondistended, + BS MS: no deformity or atrophy Skin: warm and dry, no rash Neuro:  Strength and sensation are intact Psych: euthymic mood, full affect   EKG Interpretation Date/Time:  Wednesday November 23 2023 14:16:23 EDT Ventricular Rate:  77 PR Interval:    QRS Duration:  156 QT Interval:  410 QTC Calculation: 463 R Axis:   -38  Text Interpretation: Atrial fibrillation with frequent ventricular-paced complexes Left axis deviation Left bundle branch block When compared with ECG of 06-Jul-2015 04:40, No significant change since Confirmed by [place], Curtisha Bendix [id number]) on 11/23/2023 2:18:01 PM   Recent Labs: No results found for requested labs within last 365 days.    Lipid Panel    Component Value Date/Time   CHOL 122 09/06/2022 1016   TRIG 195 (H) 09/06/2022 1016   HDL 45 09/06/2022 1016   CHOLHDL 2.7 09/06/2022 1016   CHOLHDL 3 11/11/2021 1007   VLDL 33.8 11/11/2021 1007   LDLCALC 45 09/06/2022 1016   LDLDIRECT 69.0 11/04/2020 1101      Wt Readings from Last 3 Encounters:  11/23/23 211 lb 3.2 oz (95.8 kg)  07/22/23 220 lb (99.8 kg)  04/11/23 220 lb (99.8 kg)      Other studies Reviewed: Additional studies/ records that were reviewed today include:   Echo 08/26/20: IMPRESSIONS     1. Severe asymmetric septal hypertrophy up to 22 mm consistent with  history of hypertrophic cardiomyopathy. No LVOT obstruction or SAM of the  mitral valve. Left ventricular ejection fraction, by estimation, is 65 to  70%. The left ventricle has normal  function. The left ventricle has no regional wall motion abnormalities.  There is severe asymmetric left ventricular hypertrophy of the septal  segment. Left ventricular diastolic parameters are consistent with Grade  III diastolic dysfunction  (restrictive).   2. Right ventricular systolic function is normal. The right ventricular  size is normal. Tricuspid  regurgitation signal is inadequate for assessing  PA pressure.   3. Left atrial size was mildly dilated.   4. The mitral valve is grossly normal. Mild mitral valve regurgitation.  No evidence of mitral stenosis.   5. 29 mm S3 in aortic position. Vmax 2.0 m/s, MG  8.0 mmHG, EOA 3.22 cm2,  DI 0.71. No paravalvular leak. The aortic valve has been  repaired/replaced. Aortic valve regurgitation is not visualized. Procedure  Date: 03/25/2015. Echo findings are consistent  with normal structure and function of the aortic valve prosthesis.   6. The inferior vena cava is normal in size with greater than 50%  respiratory variability, suggesting right atrial pressure of 3 mmHg.   Comparison(s): No significant change from prior study.    ASSESSMENT AND PLAN:  1.  Status post TAVR 2016 last echo Jan 2022 functioning normally. No symptoms. Valve sounds good. Aware of SBE prophylaxis.    2. Paroxysmal atrial fibrillation- now persistent-  on Coumadin.  no significant palpitations.  he is compliant with his warfarin. INRs have been therapeutic. Rate is controlled.    3. Hypertrophic nonobstructive cardiomyopathy    4. Hypotension. Previously stopped Toprol. Tamsulosin also held. Now on Rapaflo and BP OK. Infrequent lightheadedness.    5. Permanent pacemaker followed by Dr. Ladona Ridgel - placed for high degree AV block following TAVR   6. Bladder cancer -status post chemo and resection.    7. Hyperlipidemia, on low-dose of rosuvastatin 5 mg daily.    Follow up in 6 months     Disposition:   FU with me in 6 months  Signed, Domenik Trice [place], MD  11/23/2023 2:31 PM    [facility] [address] [place], [place], [address] Phone [phone], Fax [phone]

## 2023-11-23 ENCOUNTER — Ambulatory Visit: Payer: Medicare Other | Attending: Cardiology | Admitting: Cardiology

## 2023-11-23 ENCOUNTER — Ambulatory Visit: Payer: Medicare Other | Admitting: Cardiology

## 2023-11-23 VITALS — BP 105/67 | HR 68 | Wt 211.2 lb

## 2023-11-23 DIAGNOSIS — I4891 Unspecified atrial fibrillation: Secondary | ICD-10-CM | POA: Diagnosis present

## 2023-11-23 DIAGNOSIS — I10 Essential (primary) hypertension: Secondary | ICD-10-CM | POA: Diagnosis not present

## 2023-11-23 DIAGNOSIS — I447 Left bundle-branch block, unspecified: Secondary | ICD-10-CM

## 2023-11-23 DIAGNOSIS — Z952 Presence of prosthetic heart valve: Secondary | ICD-10-CM

## 2023-11-23 DIAGNOSIS — I442 Atrioventricular block, complete: Secondary | ICD-10-CM | POA: Diagnosis present

## 2023-11-23 NOTE — Patient Instructions (Signed)
 Medication Instructions:  Continue same medications *If you need a refill on your cardiac medications before your next appointment, please call your pharmacy*  Lab Work: None ordered  Testing/Procedures: None ordered  Follow-Up: At [facility], you and your health needs are our priority.  As part of our continuing mission to provide you with exceptional heart care, our providers are all part of one team.  This team includes your primary Cardiologist (physician) and Advanced Practice Providers or APPs (Physician Assistants and Nurse Practitioners) who all work together to provide you with the care you need, when you need it.  Your next appointment:  6 months   Call in July to schedule Oct appointment     Provider:  Dr.Jordan  We recommend signing up for the patient portal called "MyChart".  Sign up information is provided on this After Visit Summary.  MyChart is used to connect with patients for Virtual Visits (Telemedicine).  Patients are able to view lab/test results, encounter notes, upcoming appointments, etc.  Non-urgent messages can be sent to your provider as well.   To learn more about what you can do with MyChart, go to [email or web address].         1st Floor: - Lobby - Registration  - Pharmacy  - Lab - Cafe  2nd Floor: - PV Lab - Diagnostic Testing (echo, CT, nuclear med)  3rd Floor: - Vacant  4th Floor: - TCTS (cardiothoracic surgery) - AFib Clinic - Structural Heart Clinic - Vascular Surgery  - Vascular Ultrasound  5th Floor: - HeartCare Cardiology (general and EP) - Clinical Pharmacy for coumadin, hypertension, lipid, weight-loss medications, and med management appointments    Valet parking services will be available as well.

## 2023-11-28 ENCOUNTER — Ambulatory Visit: Payer: Medicare Other | Attending: Cardiology | Admitting: *Deleted

## 2023-11-28 DIAGNOSIS — Z5181 Encounter for therapeutic drug level monitoring: Secondary | ICD-10-CM | POA: Diagnosis not present

## 2023-11-28 DIAGNOSIS — I4891 Unspecified atrial fibrillation: Secondary | ICD-10-CM | POA: Diagnosis present

## 2023-11-28 DIAGNOSIS — Z952 Presence of prosthetic heart valve: Secondary | ICD-10-CM

## 2023-11-28 DIAGNOSIS — I639 Cerebral infarction, unspecified: Secondary | ICD-10-CM | POA: Diagnosis present

## 2023-11-28 DIAGNOSIS — Z79899 Other long term (current) drug therapy: Secondary | ICD-10-CM

## 2023-11-28 LAB — POCT INR: INR: 2.5 (ref 2.0–3.0)

## 2023-11-28 NOTE — Patient Instructions (Addendum)
 Description   Continue taking warfarin 1/2 tablet daily except 1 tablet on Monday and Friday; Recheck INR in 8 weeks per patient. Coumadin Clinic [phone] or [phone]. EAT GREENS TWICE PER WEEK.

## 2023-12-21 ENCOUNTER — Ambulatory Visit (INDEPENDENT_AMBULATORY_CARE_PROVIDER_SITE_OTHER): Payer: Medicare Other

## 2023-12-21 DIAGNOSIS — I447 Left bundle-branch block, unspecified: Secondary | ICD-10-CM

## 2023-12-21 LAB — CUP PACEART REMOTE DEVICE CHECK
Battery Remaining Longevity: 8 mo
Battery Remaining Percentage: 7 %
Battery Voltage: 2.8 V
Brady Statistic AP VP Percent: 0 %
Brady Statistic AP VS Percent: 0 %
Brady Statistic AS VP Percent: 0 %
Brady Statistic AS VS Percent: 0 %
Brady Statistic RA Percent Paced: 0 %
Brady Statistic RV Percent Paced: 60 %
Date Time Interrogation Session: 20250507040015
Implantable Lead Connection Status: 753985
Implantable Lead Connection Status: 753985
Implantable Lead Implant Date: 20161108
Implantable Lead Implant Date: 20161108
Implantable Lead Location: 753859
Implantable Lead Location: 753860
Implantable Pulse Generator Implant Date: 20161108
Lead Channel Impedance Value: 400 Ohm
Lead Channel Impedance Value: 440 Ohm
Lead Channel Pacing Threshold Amplitude: 0.625 V
Lead Channel Pacing Threshold Amplitude: 0.75 V
Lead Channel Pacing Threshold Pulse Width: 0.5 ms
Lead Channel Pacing Threshold Pulse Width: 0.5 ms
Lead Channel Sensing Intrinsic Amplitude: 12 mV
Lead Channel Sensing Intrinsic Amplitude: 5 mV
Lead Channel Setting Pacing Amplitude: 0.875
Lead Channel Setting Pacing Amplitude: 2 V
Lead Channel Setting Pacing Pulse Width: 0.5 ms
Lead Channel Setting Sensing Sensitivity: 5 mV
Pulse Gen Model: 2240
Pulse Gen Serial Number: 7825693

## 2024-01-10 ENCOUNTER — Encounter: Payer: Self-pay | Admitting: Adult Health

## 2024-01-10 ENCOUNTER — Ambulatory Visit (INDEPENDENT_AMBULATORY_CARE_PROVIDER_SITE_OTHER): Admitting: Adult Health

## 2024-01-10 ENCOUNTER — Ambulatory Visit: Payer: Self-pay | Admitting: Adult Health

## 2024-01-10 VITALS — BP 110/80 | HR 74 | Temp 97.7°F | Wt 211.0 lb

## 2024-01-10 DIAGNOSIS — Z952 Presence of prosthetic heart valve: Secondary | ICD-10-CM | POA: Diagnosis not present

## 2024-01-10 DIAGNOSIS — Z8551 Personal history of malignant neoplasm of bladder: Secondary | ICD-10-CM

## 2024-01-10 DIAGNOSIS — I442 Atrioventricular block, complete: Secondary | ICD-10-CM

## 2024-01-10 DIAGNOSIS — E785 Hyperlipidemia, unspecified: Secondary | ICD-10-CM

## 2024-01-10 DIAGNOSIS — I480 Paroxysmal atrial fibrillation: Secondary | ICD-10-CM | POA: Diagnosis not present

## 2024-01-10 LAB — COMPREHENSIVE METABOLIC PANEL WITH GFR
ALT: 15 U/L (ref 0–53)
AST: 18 U/L (ref 0–37)
Albumin: 4.1 g/dL (ref 3.5–5.2)
Alkaline Phosphatase: 59 U/L (ref 39–117)
BUN: 15 mg/dL (ref 6–23)
CO2: 26 meq/L (ref 19–32)
Calcium: 9.5 mg/dL (ref 8.4–10.5)
Chloride: 102 meq/L (ref 96–112)
Creatinine, Ser: 0.7 mg/dL (ref 0.40–1.50)
GFR: 77.24 mL/min (ref >60.00)
Glucose, Bld: 101 mg/dL — ABNORMAL HIGH (ref 70–99)
Potassium: 4.1 meq/L (ref 3.5–5.1)
Sodium: 138 meq/L (ref 135–145)
Total Bilirubin: 0.8 mg/dL (ref 0.2–1.2)
Total Protein: 6.5 g/dL (ref 6.0–8.3)

## 2024-01-10 LAB — CBC
HCT: 47 % (ref 39.0–52.0)
Hemoglobin: 15.4 g/dL (ref 13.0–17.0)
MCHC: 32.8 g/dL (ref 30.0–36.0)
MCV: 88.8 fl (ref 78.0–100.0)
Platelets: 184 10*3/uL (ref 150.0–400.0)
RBC: 5.3 Mil/uL (ref 4.22–5.81)
RDW: 14.5 % (ref 11.5–15.5)
WBC: 7.4 10*3/uL (ref 4.0–10.5)

## 2024-01-10 LAB — LIPID PANEL
Cholesterol: 130 mg/dL (ref 0–200)
HDL: 44.6 mg/dL (ref >39.00)
LDL Cholesterol: 64 mg/dL (ref 0–99)
NonHDL: 85.71
Total CHOL/HDL Ratio: 3
Triglycerides: 109 mg/dL (ref 0.0–149.0)
VLDL: 21.8 mg/dL (ref 0.0–40.0)

## 2024-01-10 LAB — TSH: TSH: 2.24 u[IU]/mL (ref 0.35–5.50)

## 2024-01-10 NOTE — Patient Instructions (Signed)
 It was great seeing you today   We will follow up with you regarding your lab work   Please let me know if you need anything

## 2024-01-10 NOTE — Progress Notes (Signed)
 Subjective:    Patient ID: Robert Anderson, male    DOB: 11/22/1923, 89 y.o.   MRN: [id number]  HPI Patient presents for yearly preventative medicine examination. 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  Aortic valve disease-status post TAVR in 2016.  He is managed by cardiology.  Denies chest pain, shortness of breath, DOE, dizziness, or palpitations  Persistent atrial fibrillation rate controlled and is on  anticoagulation with Coumadin . He has not had significant palpitations.   Complete heart block-has pacemaker.  Managed by cardiology  Hyperlipidemia-takes Crestor  5 mg daily.  He denies myalgia dose Lab Results  Component Value Date   CHOL 122 09/06/2022   HDL 45 09/06/2022   LDLCALC 45 09/06/2022   LDLDIRECT 69.0 11/04/2020   TRIG 195 (H) 09/06/2022   CHOLHDL 2.7 09/06/2022   History of bladder cancer-was diagnosed with bladder cancer in September 2021 and underwent cystoscopy followed by 6 weeks of chemotherapy which he completed without adverse effects.  He had a transurethral resection of the bladder tumor in January 2022 and repeat in February 2023 for recurrent of bladder cancer.  Currently prescribed Myrbetriq and Rapaflo 8 mg capsules. Flomax  was stopped due to hypotension. He is getting up 6 times a  night to urinate and he is fine with this.   All immunizations and health maintenance protocols were reviewed with the patient and needed orders were placed.  Appropriate screening laboratory values were ordered for the patient including screening of hyperlipidemia, renal function and hepatic function. If indicated by BPH, a PSA was ordered.  Medication reconciliation,  past medical history, social history, problem list and allergies were reviewed in detail with the patient  Goals were established with regard to weight loss, exercise, and  diet in compliance with medications. He continues to ride a stationary bike for about 7 minutes a day.   Wt Readings from Last 3 Encounters:  01/10/24 211 lb (95.7 kg)  11/23/23 211 lb 3.2 oz (95.8 kg)  07/22/23 220 lb (99.8 kg)   Overall he is feeling well and has no acute complaints. He continues to reside at [facility] with his wife.   Review of Systems  Constitutional: Negative.   HENT: Negative.    Eyes: Negative.   Respiratory: Negative.    Cardiovascular: Negative.   Gastrointestinal: Negative.   Endocrine: Negative.   Genitourinary: Negative.   Musculoskeletal:  Positive for arthralgias, back pain and gait problem.  Skin: Negative.   Allergic/Immunologic: Negative.   Hematological: Negative.   Psychiatric/Behavioral: Negative.    All other systems reviewed and are negative.  Past Medical History:  Diagnosis Date   Atrial fibrillation [facility])    a. dx after lead revision 06/26/15 >> anticoag not started due to recent pericardial effusion in setting of perforation and lead revision   Bladder cancer (HCC)    chemo done 2021   Bradycardia    a. HR 30s in clinic 06/2015 - BB discontinued.   DEGENERATIVE JOINT DISEASE, SPINE  ERECTILE DYSFUNCTION    Essential hypertension    First degree AV block    Hearing deficit    wears hearing aides both ears   HEARING LOSS    Heart block    s/p Pacemaker   Hyperlipidemia    Hypertrophic  obstructive cardiomyopathy (HCC)    LBBB (left bundle branch block)    Macular degeneration    left eye gets shots for   OA (osteoarthritis)    both hips   Pericardial effusion    a. micorpeforation s/p lead revision 06/26/15   Presence of permanent cardiac pacemaker 06/24/2015   St Jude Assurity   Prostate cancer [facility]) 2010   "external radiation; 40 treatments"   S/P TAVR (transcatheter aortic valve replacement) 03/25/2015   29 mm Edwards Sapien 3 transcatheter heart valve placed via open right transfemoral approach   Severe aortic stenosis    a. s/p TAVR 03/2015. Pre-op  LHC 02/2015: minor nonobstructive CAD.   Squamous cell skin cancer    left lower leg   Stroke (HCC) 02/19/2014   Small infarct high posterior left frontal lobe on MRI     TIA (transient ischemic attack) 01/21/2014   5 min spell aphasia without assoc sx     Uses walker    Wears glasses     Social History   Socioeconomic History   Marital status: Married    Spouse name: Not on file   Number of children: 4   Years of education: 16   Highest education level: Not on file  Occupational History   Occupation: Retired  Tobacco Use   Smoking status: Never   Smokeless tobacco: Never  Vaping Use   Vaping status: Never Used  Substance and Sexual Activity   Alcohol use: No    Alcohol/week: 0.0 standard drinks of alcohol   Drug use: No   Sexual activity: Not Currently  Other Topics Concern   Not on file  Social History Narrative   Patient lives in [place] with his wife. 2nd marriage    4 children    He goes to the [facility] three days a week      09/27/18: Lives at [facility] with wife   Still goes to [facility], 2X/week, bicycles/uses sitting eliptical in room daily   Enjoys singing, part of singing group at center   Active in discussion groups at center         Social Drivers of Health   Financial Resource Strain: Low Risk  (07/22/2023)   Overall Financial Resource Strain (CARDIA)    Difficulty of  Paying Living Expenses: Not hard at all  Food Insecurity: No Food Insecurity (07/22/2023)   Hunger Vital Sign    Worried About Running Out of Food in the Last Year: Never true    Ran Out of Food in the Last Year: Never true  Transportation Needs: No Transportation Needs (07/22/2023)   PRAPARE - Administrator, Civil Service (Medical): No    Lack of Transportation (Non-Medical): No  Physical Activity: Insufficiently Active (07/22/2023)   Exercise Vital Sign    Days of Exercise per Week: 7 days    Minutes of Exercise per Session: 10 min  Stress: No Stress Concern Present (07/22/2023)   [employer] of Occupational Health - Occupational Stress Questionnaire    Feeling of Stress : Not at all  Social Connections: Socially Integrated (07/22/2023)   Social Connection and Isolation Panel [NHANES]    Frequency of Communication with Friends and Family: More  than three times a week    Frequency of Social Gatherings with Friends and Family: More than three times a week    Attends Religious Services: More than 4 times per year    Active Member of Clubs or Organizations: Yes    Attends Banker Meetings: More than 4 times per year    Marital Status: Married  Catering manager Violence: Not At Risk (07/22/2023)   Humiliation, Afraid, Rape, and Kick questionnaire    Fear of Current or Ex-Partner: No    Emotionally Abused: No    Physically Abused: No    Sexually Abused: No    Past Surgical History:  Procedure Laterality Date   ANTERIOR CERVICAL DECOMP/DISCECTOMY FUSION  ~ 2000   Dr. Richardo Chandler with bone graft   CARDIAC CATHETERIZATION N/A 02/20/2015   Procedure: Right/Left Heart Cath and Coronary Angiography;  Surgeon: Arnoldo Lapping, MD;  Location: [facility] INVASIVE CV LAB;  Service: Cardiovascular;  Laterality: N/A;   CARDIAC VALVE REPLACEMENT     CATARACT EXTRACTION W/ INTRAOCULAR LENS  IMPLANT, BILATERAL  2004; 2010   right; left   CYSTOSCOPY W/ RETROGRADES Bilateral 04/22/2020    Procedure: CYSTOSCOPY WITH RETROGRADE PYELOGRAM/FULGERATION BLADDER BIOPSY;  Surgeon: Sherlyn Ditto, MD;  Location: [facility] ORS;  Service: Urology;  Laterality: Bilateral;   CYSTOSCOPY W/ RETROGRADES Bilateral 09/09/2020   Procedure: CYSTOSCOPY WITH RETROGRADE PYELOGRAM;  Surgeon: Sherlyn Ditto, MD;  Location: [facility];  Service: Urology;  Laterality: Bilateral;   CYSTOSCOPY W/ RETROGRADES Bilateral 10/13/2021   Procedure: CYSTOSCOPY WITH RETROGRADE PYELOGRAM;  Surgeon: Sherlyn Ditto, MD;  Location: [facility];  Service: Urology;  Laterality: Bilateral;   EP IMPLANTABLE DEVICE N/A 06/24/2015   Procedure: Pacemaker Implant;  Surgeon: Tammie Fall, MD;  Location: [facility] INVASIVE CV LAB;  Service: Cardiovascular; St Jude Assurity    EP IMPLANTABLE DEVICE N/A 06/26/2015   Procedure: Lead Revision/Repair;  Surgeon: Will Cortland Ding, MD;  Location: [facility] INVASIVE CV LAB;  Service: Cardiovascular;  Laterality: N/A;   INSERT / REPLACE / REMOVE PACEMAKER  06/24/2015   JOINT REPLACEMENT     SQUAMOUS CELL CARCINOMA EXCISION Left X 3   "lower leg"   TEE WITHOUT CARDIOVERSION N/A 03/07/2015   Procedure: TRANSESOPHAGEAL ECHOCARDIOGRAM (TEE);  Surgeon: Liza Riggers, MD;  Location: [facility] ENDOSCOPY;  Service: Cardiovascular;  Laterality: N/A;   TEE WITHOUT CARDIOVERSION N/A 03/25/2015   Procedure: TRANSESOPHAGEAL ECHOCARDIOGRAM (TEE);  Surgeon: Odie Benne, MD;  Location: [facility] OR;  Service: Open Heart Surgery;  Laterality: N/A;   TONSILLECTOMY  as child   TOTAL SHOULDER ARTHROPLASTY Right 2007   "wore it out playing tennis"   TRANSCATHETER AORTIC VALVE REPLACEMENT, TRANSFEMORAL N/A 03/25/2015   Procedure: TRANSCATHETER AORTIC VALVE REPLACEMENT, TRANSFEMORAL;  Surgeon: Odie Benne, MD;  Location: [facility] OR;  Service: Open Heart Surgery;  Laterality: N/A;   TRANSURETHRAL RESECTION OF BLADDER TUMOR N/A 09/09/2020   Procedure: TRANSURETHRAL RESECTION OF BLADDER TUMOR (TURBT);  Surgeon:  Sherlyn Ditto, MD;  Location: [facility];  Service: Urology;  Laterality: N/A;  30 MINS   TRANSURETHRAL RESECTION OF BLADDER TUMOR N/A 10/13/2021   Procedure: TRANSURETHRAL RESECTION OF BLADDER TUMOR (TURBT);  Surgeon: Sherlyn Ditto, MD;  Location: [facility];  Service: Urology;  Laterality: N/A;  30 MINS    Family History  Problem Relation Age of Onset   Heart disease Mother    CVA Mother    Stroke Mother    CVA Father  Cancer Father    Brain cancer Sister    Prostate cancer Brother    Heart disease Brother    Lung cancer Brother    Heart attack Brother    Hypertension Neg Hx     No Known Allergies  Current Outpatient Medications on File Prior to Visit  Medication Sig Dispense Refill   mirabegron ER (MYRBETRIQ) 50 MG TB24 tablet Take 50 mg by mouth every other day. At lunch     Multiple Vitamins-Minerals (PRESERVISION AREDS 2+MULTI VIT PO) Take 1 tablet by mouth 2 (two) times daily.     Omega-3 Fatty Acids (FISH OIL) 1000 MG CAPS Take 1,000 mg by mouth 2 (two) times daily.     polyethylene glycol powder (MIRALAX) 17 GM/SCOOP powder Take 1 Container by mouth as needed.     rosuvastatin  (CRESTOR ) 5 MG tablet Take 1 tablet (5 mg total) by mouth daily. 90 tablet 1   silodosin (RAPAFLO) 8 MG CAPS capsule Take 8 mg by mouth every evening. 1 tablet 30 minutes after evening meals     SODIUM FLUORIDE 5000 PLUS 1.1 % CREA dental cream Place 1 Application onto teeth at bedtime.     warfarin (COUMADIN ) 3 MG tablet TAKE 1/2 TO 1 TABLET BY MOUTH DAILY AS DIRECTED BY COUMADIN  CLINIC 95 tablet 1   amoxicillin (AMOXIL) 500 MG capsule Take 2,000 mg by mouth See admin instructions. Take 2000 mg 1 hour prior to dental work (Patient not taking: Reported on 01/10/2024)     No current facility-administered medications on file prior to visit.    BP 110/80   Pulse 74   Temp 97.7 F (36.5 C) (Oral)   Wt 211 lb (95.7 kg)   SpO2 96%   BMI 31.16 kg/m       Objective:    Physical Exam Vitals and nursing note reviewed.  Constitutional:      General: He is not in acute distress.    Appearance: Normal appearance. He is not ill-appearing.  HENT:     Head: Normocephalic and atraumatic.     Right Ear: Tympanic membrane, ear canal and external ear normal. There is no impacted cerumen.     Left Ear: Tympanic membrane, ear canal and external ear normal. There is no impacted cerumen.     Nose: Nose normal. No congestion or rhinorrhea.     Mouth/Throat:     Mouth: Mucous membranes are moist.     Pharynx: Oropharynx is clear.  Eyes:     Extraocular Movements: Extraocular movements intact.     Conjunctiva/sclera: Conjunctivae normal.     Pupils: Pupils are equal, round, and reactive to light.  Neck:     Vascular: No carotid bruit.  Cardiovascular:     Rate and Rhythm: Normal rate and regular rhythm.     Pulses: Normal pulses.     Heart sounds: No murmur heard.    No friction rub. No gallop.  Pulmonary:     Effort: Pulmonary effort is normal.     Breath sounds: Normal breath sounds.  Abdominal:     General: Abdomen is flat. Bowel sounds are normal. There is no distension.     Palpations: Abdomen is soft. There is no mass.     Tenderness: There is no abdominal tenderness. There is no guarding or rebound.     Hernia: No hernia is present.  Musculoskeletal:        General: Normal range of motion.     Cervical back: Normal range of  motion and neck supple.  Lymphadenopathy:     Cervical: No cervical adenopathy.  Skin:    General: Skin is warm and dry.     Capillary Refill: Capillary refill takes less than 2 seconds.  Neurological:     General: No focal deficit present.     Mental Status: He is alert and oriented to person, place, and time.  Psychiatric:        Mood and Affect: Mood normal.        Behavior: Behavior normal.        Thought Content: Thought content normal.        Judgment: Judgment normal.           Assessment & Plan:  1. S/P TAVR  (transcatheter aortic valve replacement) (Primary) - Follow up with cardiology as directed  - Lipid panel; Future - TSH; Future - CBC; Future - Comprehensive metabolic panel with GFR; Future  2. PAF (paroxysmal atrial fibrillation) (HCC) - Rate controlled. Continue with Warfarin  - Lipid panel; Future - TSH; Future - CBC; Future - Comprehensive metabolic panel with GFR; Future  3. Complete heart block [facility]) - Per Cardiology  - Lipid panel; Future - TSH; Future - CBC; Future - Comprehensive metabolic panel with GFR; Future  4. Hyperlipidemia, unspecified hyperlipidemia type - Continue with low dose crestor   - Lipid panel; Future - TSH; Future - CBC; Future - Comprehensive metabolic panel with GFR; Future  5. History of bladder cancer Follow up with urology as directed Samples of Myrbetriq 50 mg were given to the patient, quantity 2, Lot Number [id number]  Alto Atta, NP

## 2024-01-23 ENCOUNTER — Ambulatory Visit: Attending: Cardiology | Admitting: *Deleted

## 2024-01-23 DIAGNOSIS — Z5181 Encounter for therapeutic drug level monitoring: Secondary | ICD-10-CM | POA: Diagnosis not present

## 2024-01-23 DIAGNOSIS — I4891 Unspecified atrial fibrillation: Secondary | ICD-10-CM

## 2024-01-23 DIAGNOSIS — Z952 Presence of prosthetic heart valve: Secondary | ICD-10-CM

## 2024-01-23 DIAGNOSIS — I639 Cerebral infarction, unspecified: Secondary | ICD-10-CM

## 2024-01-23 DIAGNOSIS — Z79899 Other long term (current) drug therapy: Secondary | ICD-10-CM | POA: Insufficient documentation

## 2024-01-23 LAB — POCT INR: INR: 2.2 (ref 2.0–3.0)

## 2024-01-23 NOTE — Patient Instructions (Signed)
 Description   Continue taking warfarin 1/2 tablet daily except 1 tablet on Monday and Friday; Recheck INR in 8 weeks per patient. Coumadin Clinic [phone] or [phone]. EAT [place]ENS TWICE PER WEEK.

## 2024-01-31 NOTE — Progress Notes (Signed)
 Remote pacemaker transmission.

## 2024-02-27 ENCOUNTER — Other Ambulatory Visit: Payer: Self-pay | Admitting: Cardiology

## 2024-02-27 DIAGNOSIS — I4891 Unspecified atrial fibrillation: Secondary | ICD-10-CM

## 2024-02-27 DIAGNOSIS — E785 Hyperlipidemia, unspecified: Secondary | ICD-10-CM

## 2024-02-27 DIAGNOSIS — Z952 Presence of prosthetic heart valve: Secondary | ICD-10-CM

## 2024-02-27 DIAGNOSIS — I10 Essential (primary) hypertension: Secondary | ICD-10-CM

## 2024-02-28 NOTE — Telephone Encounter (Signed)
 Refill request for warfarin:  Last INR was 2.2 on 01/23/24 Next INR due on 03/19/24 LOV was 11/23/23  Refill approved.

## 2024-03-19 ENCOUNTER — Ambulatory Visit: Attending: Cardiology

## 2024-03-19 DIAGNOSIS — Z952 Presence of prosthetic heart valve: Secondary | ICD-10-CM | POA: Insufficient documentation

## 2024-03-19 DIAGNOSIS — Z5181 Encounter for therapeutic drug level monitoring: Secondary | ICD-10-CM | POA: Diagnosis present

## 2024-03-19 DIAGNOSIS — Z79899 Other long term (current) drug therapy: Secondary | ICD-10-CM | POA: Diagnosis present

## 2024-03-19 LAB — POCT INR: INR: 2.1 (ref 2.0–3.0)

## 2024-03-19 NOTE — Patient Instructions (Signed)
 Continue taking warfarin 1/2 tablet daily except 1 tablet on Monday and Friday; Recheck INR in 8 weeks per patient. Coumadin  Clinic [phone] or [phone]. EAT GREENS TWICE PER WEEK.

## 2024-03-19 NOTE — Progress Notes (Signed)
 INR 2.1 Please see anticoagulation encounter

## 2024-03-21 ENCOUNTER — Ambulatory Visit (INDEPENDENT_AMBULATORY_CARE_PROVIDER_SITE_OTHER): Payer: Medicare Other

## 2024-03-21 DIAGNOSIS — I447 Left bundle-branch block, unspecified: Secondary | ICD-10-CM

## 2024-03-21 LAB — CUP PACEART REMOTE DEVICE CHECK
Battery Remaining Longevity: 6 mo
Battery Remaining Percentage: 6 %
Battery Voltage: 2.77 V
Brady Statistic AP VP Percent: 0 %
Brady Statistic AP VS Percent: 0 %
Brady Statistic AS VP Percent: 0 %
Brady Statistic AS VS Percent: 0 %
Brady Statistic RA Percent Paced: 0 %
Brady Statistic RV Percent Paced: 65 %
Date Time Interrogation Session: 20250806040016
Implantable Lead Connection Status: 753985
Implantable Lead Connection Status: 753985
Implantable Lead Implant Date: 20161108
Implantable Lead Implant Date: 20161108
Implantable Lead Location: 753859
Implantable Lead Location: 753860
Implantable Pulse Generator Implant Date: 20161108
Lead Channel Impedance Value: 400 Ohm
Lead Channel Impedance Value: 430 Ohm
Lead Channel Pacing Threshold Amplitude: 0.75 V
Lead Channel Pacing Threshold Amplitude: 0.75 V
Lead Channel Pacing Threshold Pulse Width: 0.5 ms
Lead Channel Pacing Threshold Pulse Width: 0.5 ms
Lead Channel Sensing Intrinsic Amplitude: 12 mV
Lead Channel Sensing Intrinsic Amplitude: 5 mV
Lead Channel Setting Pacing Amplitude: 1 V
Lead Channel Setting Pacing Amplitude: 2 V
Lead Channel Setting Pacing Pulse Width: 0.5 ms
Lead Channel Setting Sensing Sensitivity: 5 mV
Pulse Gen Model: 2240
Pulse Gen Serial Number: 7825693

## 2024-03-22 ENCOUNTER — Ambulatory Visit: Payer: Self-pay | Admitting: Internal Medicine

## 2024-04-23 ENCOUNTER — Ambulatory Visit

## 2024-04-25 LAB — CUP PACEART REMOTE DEVICE CHECK
Battery Remaining Longevity: 6 mo
Battery Remaining Percentage: 5 %
Battery Voltage: 2.75 V
Brady Statistic AP VP Percent: 0 %
Brady Statistic AP VS Percent: 0 %
Brady Statistic AS VP Percent: 0 %
Brady Statistic AS VS Percent: 0 %
Brady Statistic RA Percent Paced: 0 %
Brady Statistic RV Percent Paced: 66 %
Date Time Interrogation Session: 20250910095148
Implantable Lead Connection Status: 753985
Implantable Lead Connection Status: 753985
Implantable Lead Implant Date: 20161108
Implantable Lead Implant Date: 20161108
Implantable Lead Location: 753859
Implantable Lead Location: 753860
Implantable Pulse Generator Implant Date: 20161108
Lead Channel Impedance Value: 400 Ohm
Lead Channel Impedance Value: 450 Ohm
Lead Channel Pacing Threshold Amplitude: 0.75 V
Lead Channel Pacing Threshold Amplitude: 0.75 V
Lead Channel Pacing Threshold Pulse Width: 0.5 ms
Lead Channel Pacing Threshold Pulse Width: 0.5 ms
Lead Channel Sensing Intrinsic Amplitude: 12 mV
Lead Channel Sensing Intrinsic Amplitude: 5 mV
Lead Channel Setting Pacing Amplitude: 1 V
Lead Channel Setting Pacing Amplitude: 2 V
Lead Channel Setting Pacing Pulse Width: 0.5 ms
Lead Channel Setting Sensing Sensitivity: 5 mV
Pulse Gen Model: 2240
Pulse Gen Serial Number: 7825693

## 2024-05-01 ENCOUNTER — Ambulatory Visit: Payer: Self-pay | Admitting: Internal Medicine

## 2024-05-13 NOTE — Progress Notes (Signed)
 Cardiology Office Note   Date:  05/23/2024   ID:  Robert Anderson, DOB May 11, 1924, MRN [id number]  PCP:  Robert Huxley, NP  Cardiologist:   Robert Hyppolite [place], MD   Chief Complaint  Patient presents with   Atrial Fibrillation      History of Present Illness: Robert Anderson is a 89 y.o. male who presents for follow up heart block and prior TAVR.  He has a history of severe aortic stenosis post TAVR 03/25/2015, HOCM, pacemaker for symptomatic 2-1 heart block following TAVR, hypertension, history of TIA and CVA, PAF on Coumadin , minor nonobstructive CAD on cath in 2016. History of statin intolerance. He has recurrent bladder CA and is s/p chemotherapy and resection. Echo in January 2022 showed marked basal septal hypertrophy without outflow gradient. Normal EF. Normal functioning AV prosthesis. Pacemaker followed by Dr Waddell.   He is living at [facility]. Retired from [employer]. Originally from [place]. Has 4 children by his first marriage. Married again 38 years ago.   He did undergo cystoscopy and TURP in Feb 2023 for transitional cell CA of the bladder.   He is doing well. He has progressed to a wheelchair if he has to walk very far because his legs give out. He has some minor hematuria but seen by urology and told nothing to worry about. His pacer is approaching ERT. No SOB or chest pain  Past Medical History:  Diagnosis Date   Atrial fibrillation (HCC)    a. dx after lead revision 06/26/15 >> anticoag not started due to recent pericardial effusion in setting of perforation and lead revision   Bladder cancer (HCC)    chemo done 2021   Bradycardia    a. HR 30s in clinic 06/2015 - BB discontinued.   DEGENERATIVE JOINT DISEASE, SPINE    ERECTILE DYSFUNCTION    Essential hypertension    First degree AV block    Hearing deficit    wears hearing aides both ears   HEARING LOSS    Heart block    s/p Pacemaker   Hyperlipidemia    Hypertrophic obstructive cardiomyopathy  (HCC)    LBBB (left bundle branch block)    Macular degeneration    left eye gets shots for   OA (osteoarthritis)    both hips   Pericardial effusion    a. micorpeforation s/p lead revision 06/26/15   Presence of permanent cardiac pacemaker 06/24/2015   St Jude Assurity   Prostate cancer [facility]) 2010   external radiation; 40 treatments   S/P TAVR (transcatheter aortic valve replacement) 03/25/2015   29 mm Edwards Sapien 3 transcatheter heart valve placed via open right transfemoral approach   Severe aortic stenosis    a. s/p TAVR 03/2015. Pre-op  LHC 02/2015: minor nonobstructive CAD.   Squamous cell skin cancer    left lower leg   Stroke (HCC) 02/19/2014   Small infarct high posterior left frontal lobe on MRI     TIA (transient ischemic attack) 01/21/2014   5 min spell aphasia without assoc sx     Uses walker    Wears glasses     Past Surgical History:  Procedure Laterality Date   ANTERIOR CERVICAL DECOMP/DISCECTOMY FUSION  ~ 2000   Dr. Lucilla with bone graft   CARDIAC CATHETERIZATION N/A 02/20/2015   Procedure: Right/Left Heart Cath and Coronary Angiography;  Surgeon: Ozell Fell, MD;  Location: [facility] INVASIVE CV LAB;  Service: Cardiovascular;  Laterality: N/A;   CARDIAC VALVE REPLACEMENT  CATARACT EXTRACTION W/ INTRAOCULAR LENS  IMPLANT, BILATERAL  2004; 2010   right; left   CYSTOSCOPY W/ RETROGRADES Bilateral 04/22/2020   Procedure: CYSTOSCOPY WITH RETROGRADE PYELOGRAM/FULGERATION BLADDER BIOPSY;  Surgeon: Rosalind Zachary NOVAK, MD;  Location: [facility] ORS;  Service: Urology;  Laterality: Bilateral;   CYSTOSCOPY W/ RETROGRADES Bilateral 09/09/2020   Procedure: CYSTOSCOPY WITH RETROGRADE PYELOGRAM;  Surgeon: Rosalind Zachary NOVAK, MD;  Location: [facility];  Service: Urology;  Laterality: Bilateral;   CYSTOSCOPY W/ RETROGRADES Bilateral 10/13/2021   Procedure: CYSTOSCOPY WITH RETROGRADE PYELOGRAM;  Surgeon: Rosalind Zachary NOVAK, MD;  Location: [facility] ORS;  Service: Urology;  Laterality:  Bilateral;   EP IMPLANTABLE DEVICE N/A 06/24/2015   Procedure: Pacemaker Implant;  Surgeon: Danelle LELON Birmingham, MD;  Location: [facility] INVASIVE CV LAB;  Service: Cardiovascular; St Jude Assurity    EP IMPLANTABLE DEVICE N/A 06/26/2015   Procedure: Lead Revision/Repair;  Surgeon: Will Gladis Norton, MD;  Location: [facility] INVASIVE CV LAB;  Service: Cardiovascular;  Laterality: N/A;   INSERT / REPLACE / REMOVE PACEMAKER  06/24/2015   JOINT REPLACEMENT     SQUAMOUS CELL CARCINOMA EXCISION Left X 3   lower leg   TEE WITHOUT CARDIOVERSION N/A 03/07/2015   Procedure: TRANSESOPHAGEAL ECHOCARDIOGRAM (TEE);  Surgeon: Leim VEAR Moose, MD;  Location: [facility] ENDOSCOPY;  Service: Cardiovascular;  Laterality: N/A;   TEE WITHOUT CARDIOVERSION N/A 03/25/2015   Procedure: TRANSESOPHAGEAL ECHOCARDIOGRAM (TEE);  Surgeon: Lonni JONETTA Cash, MD;  Location: [facility] OR;  Service: Open Heart Surgery;  Laterality: N/A;   TONSILLECTOMY  as child   TOTAL SHOULDER ARTHROPLASTY Right 2007   wore it out playing tennis   TRANSCATHETER AORTIC VALVE REPLACEMENT, TRANSFEMORAL N/A 03/25/2015   Procedure: TRANSCATHETER AORTIC VALVE REPLACEMENT, TRANSFEMORAL;  Surgeon: Lonni JONETTA Cash, MD;  [facility] OR;  Service: Open Heart Surgery;  Laterality: N/A;   TRANSURETHRAL RESECTION OF BLADDER TUMOR N/A 09/09/2020   Procedure: TRANSURETHRAL RESECTION OF BLADDER TUMOR (TURBT);  Surgeon: Rosalind Zachary NOVAK, MD;  Location: [facility];  Service: Urology;  Laterality: N/A;  30 MINS   TRANSURETHRAL RESECTION OF BLADDER TUMOR N/A 10/13/2021   Procedure: TRANSURETHRAL RESECTION OF BLADDER TUMOR (TURBT);  Surgeon: Rosalind Zachary NOVAK, MD;  Location: [facility] ORS;  Service: Urology;  Laterality: N/A;  30 MINS     Current Outpatient Medications  Medication Sig Dispense Refill   amoxicillin (AMOXIL) 500 MG capsule Take 2,000 mg by mouth See admin instructions. Take 2000 mg 1 hour prior to dental work     mirabegron ER (MYRBETRIQ) 50 MG TB24  tablet Take 50 mg by mouth every other day. At lunch     Multiple Vitamins-Minerals (PRESERVISION AREDS 2+MULTI VIT PO) Take 1 tablet by mouth 2 (two) times daily.     Omega-3 Fatty Acids (FISH OIL) 1000 MG CAPS Take 1,000 mg by mouth 2 (two) times daily.     polyethylene glycol powder (MIRALAX) 17 GM/SCOOP powder Take 1 Container by mouth as needed.     rosuvastatin  (CRESTOR ) 5 MG tablet TAKE 1 TABLET BY MOUTH DAILY 90 tablet 1   silodosin (RAPAFLO) 8 MG CAPS capsule Take 8 mg by mouth every evening. 1 tablet 30 minutes after evening meals     SODIUM FLUORIDE 5000 PLUS 1.1 % CREA dental cream Place 1 Application onto teeth at bedtime.     warfarin (COUMADIN ) 3 MG tablet TAKE 1/2 TO 1 TABLET BY MOUTH  DAILY AS DIRECTED BY COUMADIN   CLINIC 90 tablet 1   No current facility-administered  medications for this visit.    Allergies:   Patient has no known allergies.    Social History:  The patient  reports that he has never smoked. He has never used smokeless tobacco. He reports that he does not drink alcohol and does not use drugs.   Family History:  The patient's family history includes Brain cancer in his sister; CVA in his father and mother; Cancer in his father; Heart attack in his brother; Heart disease in his brother and mother; Lung cancer in his brother; Prostate cancer in his brother; Stroke in his mother.    ROS:  Please see the history of present illness.   Otherwise, review of systems are positive for none.   All other systems are reviewed and negative.    PHYSICAL EXAM: VS:  BP 100/60 (BP Location: Right Arm, Patient Position: Sitting, Cuff Size: Normal)   Pulse 73   Ht 5' 8 (1.727 m)   Wt 220 lb (99.8 kg)   SpO2 93%   BMI 33.45 kg/m  , BMI Body mass index is 33.45 kg/m. GEN: Well nourished, well developed, in no acute distress HEENT: normal Neck: no JVD, carotid bruits, or masses Cardiac: RRR; no murmurs, rubs, or gallops,no edema  Respiratory:  clear to auscultation  bilaterally, normal work of breathing GI: soft, nontender, nondistended, + BS MS: no deformity or atrophy Skin: warm and dry, no rash Neuro:  Strength and sensation are intact Psych: euthymic mood, full affect       Recent Labs: 01/10/2024: ALT 15; BUN 15; Creatinine, Ser 0.70; Hemoglobin 15.4; Platelets 184.0; Potassium 4.1; Sodium 138; TSH 2.24    Lipid Panel    Component Value Date/Time   CHOL 130 01/10/2024 1036   CHOL 122 09/06/2022 1016   TRIG 109.0 01/10/2024 1036   HDL 44.60 01/10/2024 1036   HDL 45 09/06/2022 1016   CHOLHDL 3 01/10/2024 1036   VLDL 21.8 01/10/2024 1036   LDLCALC 64 01/10/2024 1036   LDLCALC 45 09/06/2022 1016   LDLDIRECT 69.0 11/04/2020 1101      Wt Readings from Last 3 Encounters:  05/23/24 220 lb (99.8 kg)  01/10/24 211 lb (95.7 kg)  11/23/23 211 lb 3.2 oz (95.8 kg)      Other studies Reviewed: Additional studies/ records that were reviewed today include:   Echo 08/26/20: IMPRESSIONS     1. Severe asymmetric septal hypertrophy up to 22 mm consistent with  history of hypertrophic cardiomyopathy. No LVOT obstruction or SAM of the  mitral valve. Left ventricular ejection fraction, by estimation, is 65 to  70%. The left ventricle has normal  function. The left ventricle has no regional wall motion abnormalities.  There is severe asymmetric left ventricular hypertrophy of the septal  segment. Left ventricular diastolic parameters are consistent with Grade  III diastolic dysfunction  (restrictive).   2. Right ventricular systolic function is normal. The right ventricular  size is normal. Tricuspid regurgitation signal is inadequate for assessing  PA pressure.   3. Left atrial size was mildly dilated.   4. The mitral valve is grossly normal. Mild mitral valve regurgitation.  No evidence of mitral stenosis.   5. 29 mm S3 in aortic position. Vmax 2.0 m/s, MG 8.0 mmHG, EOA 3.22 cm2,  DI 0.71. No paravalvular leak. The aortic valve has been   repaired/replaced. Aortic valve regurgitation is not visualized. Procedure  Date: 03/25/2015. Echo findings are consistent  with normal structure and function of the aortic valve prosthesis.   6.  The inferior vena cava is normal in size with greater than 50%  respiratory variability, suggesting right atrial pressure of 3 mmHg.   Comparison(s): No significant change from prior study.    ASSESSMENT AND PLAN:  1.  Status post TAVR 2016 last echo Jan 2022 functioning normally. No symptoms. Valve sounds good. Aware of SBE prophylaxis.    2. Paroxysmal atrial fibrillation- now persistent-  on Coumadin .  no significant palpitations.  he is compliant with his warfarin. INRs have been therapeutic. Rate is controlled.    3. Hypertrophic nonobstructive cardiomyopathy     4. Permanent pacemaker followed by Dr. Waddell - placed for high degree AV block following TAVR. Approaching ERT   6. Bladder cancer -status post chemo and resection. Followed by urology   7. Hyperlipidemia, on low-dose of rosuvastatin  5 mg daily.    Follow up in 6 months     Disposition:   FU with me in 6 months  Signed, Jayel Scaduto [place], MD  05/23/2024 3:44 PM    [facility] [address] [place], [place], [address] Phone [phone], Fax [phone]

## 2024-05-14 ENCOUNTER — Encounter

## 2024-05-14 NOTE — Progress Notes (Signed)
 Remote PPM Transmission

## 2024-05-16 ENCOUNTER — Ambulatory Visit: Attending: Cardiology | Admitting: Pharmacist

## 2024-05-16 DIAGNOSIS — Z5181 Encounter for therapeutic drug level monitoring: Secondary | ICD-10-CM | POA: Diagnosis present

## 2024-05-16 DIAGNOSIS — I639 Cerebral infarction, unspecified: Secondary | ICD-10-CM | POA: Insufficient documentation

## 2024-05-16 DIAGNOSIS — Z79899 Other long term (current) drug therapy: Secondary | ICD-10-CM | POA: Diagnosis present

## 2024-05-16 DIAGNOSIS — I4891 Unspecified atrial fibrillation: Secondary | ICD-10-CM | POA: Insufficient documentation

## 2024-05-16 DIAGNOSIS — Z952 Presence of prosthetic heart valve: Secondary | ICD-10-CM | POA: Diagnosis present

## 2024-05-16 LAB — POCT INR: INR: 2.6 (ref 2.0–3.0)

## 2024-05-16 NOTE — Progress Notes (Signed)
 Description   INR 2.6: Continue taking warfarin 1/2 tablet daily except 1 tablet on Monday and Friday; Recheck INR in 8 weeks per patient. Coumadin  Clinic [phone] or [phone]. EAT GREENS TWICE PER WEEK.

## 2024-05-16 NOTE — Patient Instructions (Signed)
 Description   INR 2.6: Continue taking warfarin 1/2 tablet daily except 1 tablet on Monday and Friday; Recheck INR in 8 weeks per patient. Coumadin  Clinic [phone] or [phone]. EAT GREENS TWICE PER WEEK.

## 2024-05-23 ENCOUNTER — Ambulatory Visit: Attending: Cardiology | Admitting: Cardiology

## 2024-05-23 ENCOUNTER — Encounter: Payer: Self-pay | Admitting: Cardiology

## 2024-05-23 VITALS — BP 100/60 | HR 73 | Ht 68.0 in | Wt 220.0 lb

## 2024-05-23 DIAGNOSIS — Z952 Presence of prosthetic heart valve: Secondary | ICD-10-CM | POA: Diagnosis present

## 2024-05-23 DIAGNOSIS — I442 Atrioventricular block, complete: Secondary | ICD-10-CM | POA: Diagnosis present

## 2024-05-23 DIAGNOSIS — I447 Left bundle-branch block, unspecified: Secondary | ICD-10-CM | POA: Insufficient documentation

## 2024-05-23 DIAGNOSIS — I4891 Unspecified atrial fibrillation: Secondary | ICD-10-CM | POA: Diagnosis not present

## 2024-05-23 DIAGNOSIS — Z950 Presence of cardiac pacemaker: Secondary | ICD-10-CM | POA: Insufficient documentation

## 2024-05-23 NOTE — Patient Instructions (Signed)
 Medication Instructions:  Continue same medications *If you need a refill on your cardiac medications before your next appointment, please call your pharmacy*  Lab Work: None ordered  Testing/Procedures: None ordered  Follow-Up: At [facility], you and your health needs are our priority.  As part of our continuing mission to provide you with exceptional heart care, our providers are all part of one team.  This team includes your primary Cardiologist (physician) and Advanced Practice Providers or APPs (Physician Assistants and Nurse Practitioners) who all work together to provide you with the care you need, when you need it.  Your next appointment  6 months   Call in Jan to schedule April appointment     Provider:  Dr.Jordan   We recommend signing up for the patient portal called MyChart.  Sign up information is provided on this After Visit Summary.  MyChart is used to connect with patients for Virtual Visits (Telemedicine).  Patients are able to view lab/test results, encounter notes, upcoming appointments, etc.  Non-urgent messages can be sent to your provider as well.   To learn more about what you can do with MyChart, go to [email or web address].

## 2024-05-24 ENCOUNTER — Encounter

## 2024-05-24 LAB — CUP PACEART REMOTE DEVICE CHECK
Battery Remaining Longevity: 5 mo
Battery Remaining Percentage: 4 %
Battery Voltage: 2.74 V
Brady Statistic AP VP Percent: 0 %
Brady Statistic AP VS Percent: 0 %
Brady Statistic AS VP Percent: 0 %
Brady Statistic AS VS Percent: 0 %
Brady Statistic RA Percent Paced: 0 %
Brady Statistic RV Percent Paced: 68 %
Date Time Interrogation Session: 20251009020012
Implantable Lead Connection Status: 753985
Implantable Lead Connection Status: 753985
Implantable Lead Implant Date: 20161108
Implantable Lead Implant Date: 20161108
Implantable Lead Location: 753859
Implantable Lead Location: 753860
Implantable Pulse Generator Implant Date: 20161108
Lead Channel Impedance Value: 400 Ohm
Lead Channel Impedance Value: 450 Ohm
Lead Channel Pacing Threshold Amplitude: 0.75 V
Lead Channel Pacing Threshold Amplitude: 3.5 V
Lead Channel Pacing Threshold Pulse Width: 0.5 ms
Lead Channel Pacing Threshold Pulse Width: 0.5 ms
Lead Channel Sensing Intrinsic Amplitude: 12 mV
Lead Channel Sensing Intrinsic Amplitude: 5 mV
Lead Channel Setting Pacing Amplitude: 2 V
Lead Channel Setting Pacing Amplitude: 3.75 V
Lead Channel Setting Pacing Pulse Width: 0.5 ms
Lead Channel Setting Sensing Sensitivity: 5 mV
Pulse Gen Model: 2240
Pulse Gen Serial Number: 7825693

## 2024-05-30 ENCOUNTER — Ambulatory Visit: Payer: Self-pay | Admitting: Internal Medicine

## 2024-06-21 ENCOUNTER — Ambulatory Visit: Payer: Self-pay

## 2024-06-21 NOTE — Telephone Encounter (Signed)
 FYI Only or Action Required?: FYI only for provider: appointment scheduled on 11/7.  Patient was last seen in primary care on 01/10/2024 by Merna Huxley, NP.  Called Nurse Triage reporting Cough.  Symptoms began a week ago.  Symptoms are: gradually worsening.  Triage Disposition: See Physician Within 24 Hours  Patient/caregiver understands and will follow disposition?: Yes     Copied from CRM #[id number]. Topic: Clinical - Red Word Triage >> Jun 21, 2024  9:21 AM Adelita E wrote: [facility] that prompted transfer to Nurse Triage: Discolored phlegm. Productive cough with brown phlegm. Worsening cough, going on for 1 week. Patient also having neck soreness. Wife, Christopher, on the line.      Reason for Disposition  [1] Continuous (nonstop) coughing interferes with work or school AND [2] no improvement using cough treatment per Care Advice  Answer Assessment - Initial Assessment Questions 1. ONSET: When did the cough begin?      1 week 2. SEVERITY: How bad is the cough today?      Moderate  3. SPUTUM: Describe the color of your sputum (e.g., none, dry cough; clear, white, yellow, green)     Brown  4. HEMOPTYSIS: Are you coughing up any blood? If Yes, ask: How much? (e.g., flecks, streaks, tablespoons, etc.)     No 5. DIFFICULTY BREATHING: Are you having difficulty breathing? If Yes, ask: How bad is it? (e.g., mild, moderate, severe)      No 6. FEVER: Do you have a fever? If Yes, ask: What is your temperature, how was it measured, and when did it start?     No 7. CARDIAC HISTORY: Do you have any history of heart disease? (e.g., heart attack, congestive heart failure)      Yes 8. LUNG HISTORY: Do you have any history of lung disease?  (e.g., pulmonary embolus, asthma, emphysema)     No 9. PE RISK FACTORS: Do you have a history of blood clots? (or: recent major surgery, recent prolonged travel, bedridden)     No 10. OTHER SYMPTOMS: Do you have any other  symptoms? (e.g., runny nose, wheezing, chest pain)       No  Protocols used: Cough - Acute Productive-A-AH

## 2024-06-22 ENCOUNTER — Ambulatory Visit (INDEPENDENT_AMBULATORY_CARE_PROVIDER_SITE_OTHER): Admitting: Adult Health

## 2024-06-22 ENCOUNTER — Encounter: Payer: Self-pay | Admitting: Adult Health

## 2024-06-22 VITALS — BP 105/78 | HR 73 | Temp 98.1°F

## 2024-06-22 DIAGNOSIS — B9789 Other viral agents as the cause of diseases classified elsewhere: Secondary | ICD-10-CM

## 2024-06-22 DIAGNOSIS — M542 Cervicalgia: Secondary | ICD-10-CM | POA: Diagnosis not present

## 2024-06-22 DIAGNOSIS — J988 Other specified respiratory disorders: Secondary | ICD-10-CM

## 2024-06-22 MED ORDER — BENZONATATE 200 MG PO CAPS: 200.0000 mg | ORAL_CAPSULE | Freq: Three times a day (TID) | ORAL | 1 refills | Status: AC | PRN

## 2024-06-22 NOTE — Progress Notes (Signed)
 Subjective:    Patient ID: Robert Anderson, male    DOB: 17-Feb-1924, 89 y.o.   MRN: [id number]  Cough    Discussed the use of AI scribe software for clinical note transcription with the patient, who gave verbal consent to proceed.  History of Present Illness   Robert Anderson is a 89 year old who presents with a cough and neck pain.  He has had a cough for four to five days, producing phlegm that was slightly brown at the beginning of the weak but is clear today. There is no throat pain, malaise, shortness of breath, or fever. He has not used any over-the-counter medications. The cough is intermittent, with occasional runny nose and clear nasal discharge. No sinus pressure is present.  Neck pain has been present for a significant period, described as soreness, particularly noticeable when getting up at night. He has tried ice and a heating pad for relief, with no clear preference for effectiveness. The pain is tolerable and does not significantly impact daily activities. Mild discomfort occurs with head movement, but no severe pain. He is concerned about arthritis.       Review of Systems  Respiratory:  Positive for cough.    See HPI   Past Medical History:  Diagnosis Date   Atrial fibrillation (HCC)    a. dx after lead revision 06/26/15 >> anticoag not started due to recent pericardial effusion in setting of perforation and lead revision   Bladder cancer (HCC)    chemo done 2021   Bradycardia    a. HR 30s in clinic 06/2015 - BB discontinued.   DEGENERATIVE JOINT DISEASE, SPINE    ERECTILE DYSFUNCTION    Essential hypertension    First degree AV block    Hearing deficit    wears hearing aides both ears   HEARING LOSS    Heart block    s/p Pacemaker   Hyperlipidemia    Hypertrophic obstructive cardiomyopathy (HCC)    LBBB (left bundle branch block)    Macular degeneration    left eye gets shots for   OA (osteoarthritis)    both hips   Pericardial effusion    a.  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 s/p TAVR 03/2015. Pre-op  LHC 02/2015: minor nonobstructive CAD.   Squamous cell skin cancer    left lower leg   Stroke (HCC) 02/19/2014   Small infarct high posterior left frontal lobe on MRI     TIA (transient ischemic attack) 01/21/2014   5 min spell aphasia without assoc sx     Uses walker    Wears glasses     Social History   Socioeconomic History   Marital status: Married    Spouse name: Not on file   Number of children: 4   Years of education: 16   Highest education level: Not on file  Occupational History   Occupation: Retired  Tobacco Use   Smoking status: Never   Smokeless tobacco: Never  Vaping Use   Vaping status: Never Used  Substance and Sexual Activity   Alcohol use: No    Alcohol/week: 0.0 standard drinks of alcohol  Drug use: No   Sexual activity: Not Currently  Other Topics Concern   Not on file  Social History Narrative   Patient lives in [place] with his wife. 2nd marriage    4 children    He goes to the [facility] three days a week      09/27/18: Lives at [facility] with wife   Still goes to [facility], 2X/week, bicycles/uses sitting eliptical in room daily   Enjoys singing, part of singing group at center   Active in discussion groups at center         Social Drivers of Health   Financial Resource Strain: Low Risk  (07/22/2023)   Overall Financial Resource Strain (CARDIA)    Difficulty of Paying Living Expenses: Not hard at all  Food Insecurity: No Food Insecurity (07/22/2023)   Hunger Vital Sign    Worried About Running Out of Food in the Last Year: Never true    Ran Out of  Food in the Last Year: Never true  Transportation Needs: No Transportation Needs (07/22/2023)   PRAPARE - Administrator, Civil Service (Medical): No    Lack of Transportation (Non-Medical): No  Physical Activity: Insufficiently Active (07/22/2023)   Exercise Vital Sign    Days of Exercise per Week: 7 days    Minutes of Exercise per Session: 10 min  Stress: No Stress Concern Present (07/22/2023)   [employer] of Occupational Health - Occupational Stress Questionnaire    Feeling of Stress : Not at all  Social Connections: Socially Integrated (07/22/2023)   Social Connection and Isolation Panel    Frequency of Communication with Friends and Family: More than three times a week    Frequency of Social Gatherings with Friends and Family: More than three times a week    Attends Religious Services: More than 4 times per year    Active Member of Clubs or Organizations: Yes    Attends Banker Meetings: More than 4 times per year    Marital Status: Married  Catering Manager Violence: Not At Risk (07/22/2023)   Humiliation, Afraid, Rape, and Kick questionnaire    Fear of Current or Ex-Partner: No    Emotionally Abused: No    Physically Abused: No    Sexually Abused: No    Past Surgical History:  Procedure Laterality Date   ANTERIOR CERVICAL DECOMP/DISCECTOMY FUSION  ~ 2000   Dr. Lucilla with bone graft   CARDIAC CATHETERIZATION N/A 02/20/2015   Procedure: Right/Left Heart Cath and Coronary Angiography;  Surgeon: Ozell Fell, MD;  Location: [facility] INVASIVE CV LAB;  Service: Cardiovascular;  Laterality: N/A;   CARDIAC VALVE REPLACEMENT     CATARACT EXTRACTION W/ INTRAOCULAR LENS  IMPLANT, BILATERAL  2004; 2010   right; left   CYSTOSCOPY W/ RETROGRADES Bilateral 04/22/2020   Procedure: CYSTOSCOPY WITH RETROGRADE PYELOGRAM/FULGERATION BLADDER BIOPSY;  Surgeon: Rosalind Zachary NOVAK, MD;  Location: [facility] ORS;  Service: Urology;  Laterality: Bilateral;   CYSTOSCOPY W/ RETROGRADES  Bilateral 09/09/2020   Procedure: CYSTOSCOPY WITH RETROGRADE PYELOGRAM;  Surgeon: Rosalind Zachary NOVAK, MD;  Location: [facility];  Service: Urology;  Laterality: Bilateral;   CYSTOSCOPY W/ RETROGRADES Bilateral 10/13/2021   Procedure: CYSTOSCOPY WITH RETROGRADE PYELOGRAM;  Surgeon: Rosalind Zachary NOVAK, MD;  Location: [facility];  Service: Urology;  Laterality: Bilateral;   EP IMPLANTABLE DEVICE N/A 06/24/2015   Procedure: Pacemaker Implant;  Surgeon: Danelle LELON Birmingham, MD;  Location: [facility] INVASIVE CV LAB;  Service: Cardiovascular; St Jude Assurity  EP IMPLANTABLE DEVICE N/A 06/26/2015   Procedure: Lead Revision/Repair;  Surgeon: Will Gladis Norton, MD;  Location: [facility] INVASIVE CV LAB;  Service: Cardiovascular;  Laterality: N/A;   INSERT / REPLACE / REMOVE PACEMAKER  06/24/2015   JOINT REPLACEMENT     SQUAMOUS CELL CARCINOMA EXCISION Left X 3   lower leg   TEE WITHOUT CARDIOVERSION N/A 03/07/2015   Procedure: TRANSESOPHAGEAL ECHOCARDIOGRAM (TEE);  Surgeon: Leim VEAR Moose, MD;  Location: [facility] ENDOSCOPY;  Service: Cardiovascular;  Laterality: N/A;   TEE WITHOUT CARDIOVERSION N/A 03/25/2015   Procedure: TRANSESOPHAGEAL ECHOCARDIOGRAM (TEE);  Surgeon: Lonni JONETTA Cash, MD;  Location: [facility] OR;  Service: Open Heart Surgery;  Laterality: N/A;   TONSILLECTOMY  as child   TOTAL SHOULDER ARTHROPLASTY Right 2007   wore it out playing tennis   TRANSCATHETER AORTIC VALVE REPLACEMENT, TRANSFEMORAL N/A 03/25/2015   Procedure: TRANSCATHETER AORTIC VALVE REPLACEMENT, TRANSFEMORAL;  Surgeon: Lonni JONETTA Cash, MD;  Location: [facility] OR;  Service: Open Heart Surgery;  Laterality: N/A;   TRANSURETHRAL RESECTION OF BLADDER TUMOR N/A 09/09/2020   Procedure: TRANSURETHRAL RESECTION OF BLADDER TUMOR (TURBT);  Surgeon: Rosalind Zachary NOVAK, MD;  Location: [facility];  Service: Urology;  Laterality: N/A;  30 MINS   TRANSURETHRAL RESECTION OF BLADDER TUMOR N/A 10/13/2021   Procedure: TRANSURETHRAL  RESECTION OF BLADDER TUMOR (TURBT);  Surgeon: Rosalind Zachary NOVAK, MD;  Location: [facility];  Service: Urology;  Laterality: N/A;  30 MINS    Family History  Problem Relation Age of Onset   Heart disease Mother    CVA Mother    Stroke Mother    CVA Father    Cancer Father    Brain cancer Sister    Prostate cancer Brother    Heart disease Brother    Lung cancer Brother    Heart attack Brother    Hypertension Neg Hx     No Known Allergies  Current Outpatient Medications on File Prior to Visit  Medication Sig Dispense Refill   amoxicillin (AMOXIL) 500 MG capsule Take 2,000 mg by mouth See admin instructions. Take 2000 mg 1 hour prior to dental work     mirabegron ER (MYRBETRIQ) 50 MG TB24 tablet Take 50 mg by mouth every other day. At lunch     Multiple Vitamins-Minerals (PRESERVISION AREDS 2+MULTI VIT PO) Take 1 tablet by mouth 2 (two) times daily.     Omega-3 Fatty Acids (FISH OIL) 1000 MG CAPS Take 1,000 mg by mouth 2 (two) times daily.     polyethylene glycol powder (MIRALAX) 17 GM/SCOOP powder Take 1 Container by mouth as needed.     rosuvastatin  (CRESTOR ) 5 MG tablet TAKE 1 TABLET BY MOUTH DAILY 90 tablet 1   silodosin (RAPAFLO) 8 MG CAPS capsule Take 8 mg by mouth every evening. 1 tablet 30 minutes after evening meals     SODIUM FLUORIDE 5000 PLUS 1.1 % CREA dental cream Place 1 Application onto teeth at bedtime.     warfarin (COUMADIN ) 3 MG tablet TAKE 1/2 TO 1 TABLET BY MOUTH  DAILY AS DIRECTED BY COUMADIN   CLINIC 90 tablet 1   No current facility-administered medications on file prior to visit.    BP 105/78   Pulse 73   Temp 98.1 F (36.7 C) (Oral)   SpO2 93%       Objective:   Physical Exam Vitals and nursing note reviewed.  Constitutional:      Appearance: Normal appearance.  HENT:     Nose: Congestion and  rhinorrhea present. Rhinorrhea is clear.     Right Turbinates: Not enlarged or swollen.     Left Turbinates: Not enlarged or swollen.     Right Sinus: No  maxillary sinus tenderness or frontal sinus tenderness.     Left Sinus: No maxillary sinus tenderness or frontal sinus tenderness.     Mouth/Throat:     Lips: Pink.     Mouth: Mucous membranes are moist.     Pharynx: Postnasal drip present. No oropharyngeal exudate or posterior oropharyngeal erythema.  Cardiovascular:     Rate and Rhythm: Normal rate and regular rhythm.     Pulses: Normal pulses.     Heart sounds: Normal heart sounds.  Pulmonary:     Effort: Pulmonary effort is normal.     Breath sounds: Normal breath sounds. No wheezing, rhonchi or rales.  Neurological:     General: No focal deficit present.     Mental Status: He is alert and oriented to person, place, and time.  Psychiatric:        Mood and Affect: Mood normal.        Behavior: Behavior normal.        Thought Content: Thought content normal.        Judgment: Judgment normal.       Assessment & Plan:  Assessment and Plan    Acute viral upper respiratory infection with cough Cough with phlegm likely from sinus drainage, no fever or respiratory distress, consistent with viral infection. - Prescribed cough medicine. - Advised symptoms should resolve by end of weekend.  Cervicalgia Chronic mild neck pain, possibly arthritic. - Advised heat therapy for neck pain. - Refused xray      Darleene Shape, NP  I personally spent a total of 30 minutes in the care of the patient today including preparing to see the patient, getting/reviewing separately obtained history, performing a medically appropriate exam/evaluation, counseling and educating, and documenting clinical information in the EHR.

## 2024-06-22 NOTE — Patient Instructions (Signed)
 I think you have a viral infection. I have sent in a cough medication called Tessalon   Use heat on your back

## 2024-06-25 ENCOUNTER — Ambulatory Visit

## 2024-06-25 DIAGNOSIS — I447 Left bundle-branch block, unspecified: Secondary | ICD-10-CM | POA: Diagnosis not present

## 2024-06-25 LAB — CUP PACEART REMOTE DEVICE CHECK
Battery Remaining Longevity: 1 mo
Battery Remaining Percentage: 3 %
Battery Voltage: 2.69 V
Brady Statistic AP VP Percent: 0 %
Brady Statistic AP VS Percent: 0 %
Brady Statistic AS VP Percent: 0 %
Brady Statistic AS VS Percent: 0 %
Brady Statistic RA Percent Paced: 0 %
Brady Statistic RV Percent Paced: 67 %
Date Time Interrogation Session: 20251110020015
Implantable Lead Connection Status: 753985
Implantable Lead Connection Status: 753985
Implantable Lead Implant Date: 20161108
Implantable Lead Implant Date: 20161108
Implantable Lead Location: 753859
Implantable Lead Location: 753860
Implantable Pulse Generator Implant Date: 20161108
Lead Channel Impedance Value: 400 Ohm
Lead Channel Impedance Value: 440 Ohm
Lead Channel Pacing Threshold Amplitude: 0.625 V
Lead Channel Pacing Threshold Amplitude: 0.75 V
Lead Channel Pacing Threshold Pulse Width: 0.5 ms
Lead Channel Pacing Threshold Pulse Width: 0.5 ms
Lead Channel Sensing Intrinsic Amplitude: 12 mV
Lead Channel Sensing Intrinsic Amplitude: 5 mV
Lead Channel Setting Pacing Amplitude: 0.875
Lead Channel Setting Pacing Amplitude: 2 V
Lead Channel Setting Pacing Pulse Width: 0.5 ms
Lead Channel Setting Sensing Sensitivity: 5 mV
Pulse Gen Model: 2240
Pulse Gen Serial Number: 7825693

## 2024-06-27 ENCOUNTER — Ambulatory Visit: Admitting: Internal Medicine

## 2024-06-28 NOTE — Progress Notes (Signed)
 Remote PPM Transmission

## 2024-07-01 ENCOUNTER — Ambulatory Visit: Payer: Self-pay | Admitting: Internal Medicine

## 2024-07-02 ENCOUNTER — Ambulatory Visit: Payer: Self-pay

## 2024-07-02 NOTE — Telephone Encounter (Signed)
 FYI Only or Action Required?: FYI only for provider: appointment scheduled on tomorrow.  Patient was last seen in primary care on 06/22/2024 by Merna Huxley, NP.  Called Nurse Triage reporting Cough.  Symptoms began several weeks ago.  Interventions attempted: Prescription medications: tessalon.  Symptoms are: gradually worsening.  Triage Disposition: See PCP When Office is Open (Within 3 Days)  Patient/caregiver understands and will follow disposition?: Yes, will follow disposition  Copied from CRM #[id number]. Topic: Clinical - Red Word Triage >> Jul 02, 2024 10:55 AM Avram MATSU wrote: Red Word that prompted transfer to Nurse Triage: patient has a deep cough that wont go away. Pt is taking benzonatate (TESSALON) 200 MG capsule [[id number]] Reason for Disposition  Cough has been present for > 3 weeks  Answer Assessment - Initial Assessment Questions 1. ONSET: When did the cough begin?      Few weeks 2. SEVERITY: How bad is the cough today?      mild 3. SPUTUM: Describe the color of your sputum (e.g., none, dry cough; clear, white, yellow, green)     clear 4. HEMOPTYSIS: Are you coughing up any blood? If Yes, ask: How much? (e.g., flecks, streaks, tablespoons, etc.)     denies 5. DIFFICULTY BREATHING: Are you having difficulty breathing? If Yes, ask: How bad is it? (e.g., mild, moderate, severe)      denies 6. FEVER: Do you have a fever? If Yes, ask: What is your temperature, how was it measured, and when did it start?     Denies 7. CARDIAC HISTORY: Do you have any history of heart disease? (e.g., heart attack, congestive heart failure)      pacemaker 8. LUNG HISTORY: Do you have any history of lung disease?  (e.g., pulmonary embolus, asthma, emphysema)     denies 9. PE RISK FACTORS: Do you have a history of blood clots? (or: recent major surgery, recent prolonged travel, bedridden)     denies 10. OTHER SYMPTOMS: Do you have any other symptoms? (e.g.,  runny nose, wheezing, chest pain)       denies  Protocols used: Cough - Acute Productive-A-AH

## 2024-07-03 ENCOUNTER — Ambulatory Visit (INDEPENDENT_AMBULATORY_CARE_PROVIDER_SITE_OTHER): Admitting: Adult Health

## 2024-07-03 ENCOUNTER — Encounter: Payer: Self-pay | Admitting: Adult Health

## 2024-07-03 VITALS — BP 100/50 | HR 71 | Temp 97.7°F | Ht 68.0 in

## 2024-07-03 DIAGNOSIS — R051 Acute cough: Secondary | ICD-10-CM

## 2024-07-03 NOTE — Progress Notes (Signed)
 Subjective:    Patient ID: Robert Anderson, male    DOB: 1923/12/01, 89 y.o.   MRN: [id number]  Cough   Discussed the use of AI scribe software for clinical note transcription with the patient, who gave verbal consent to proceed.  History of Present Illness   Robert Anderson is a 89 year old who presents for follow up regarding persistent cough.  He has had a persistent cough with clear mucus production for about two weeks. When he was see about a week and a half ago he was prescribed Tessalon, which did not alleviate the cough. The cough is less frequent at night and occurs sporadically during the day, with episodes following periods of two to three hours without coughing.   He denies throat pain, fatigue, shortness of breath, fevers, chills, sinus congestion or feeling acutely ill.        Review of Systems  Respiratory:  Positive for cough.    See HPI   Past Medical History:  Diagnosis Date   Atrial fibrillation (HCC)    a. dx after lead revision 06/26/15 >> anticoag not started due to recent pericardial effusion in setting of perforation and lead revision   Bladder cancer (HCC)    chemo done 2021   Bradycardia    a. HR 30s in clinic 06/2015 - BB discontinued.   DEGENERATIVE JOINT DISEASE, SPINE    ERECTILE DYSFUNCTION    Essential hypertension    First degree AV block    Hearing deficit    wears hearing aides both ears   HEARING LOSS    Heart block    s/p Pacemaker   Hyperlipidemia    Hypertrophic obstructive cardiomyopathy (HCC)    LBBB (left bundle branch block)    Macular degeneration    left eye gets shots for   OA (osteoarthritis)    both hips   Pericardial effusion    a. 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 s/p TAVR 03/2015. Pre-op  LHC 02/2015: minor nonobstructive CAD.   Squamous cell skin cancer    left lower leg   Stroke (HCC) 02/19/2014   Small infarct high posterior left frontal lobe on MRI     TIA (transient ischemic attack) 01/21/2014   5 min spell aphasia without assoc sx     Uses walker    Wears glasses     Social History   Socioeconomic History   Marital status: Married    Spouse name: Not on file   Number of children: 4   Years of education: 16   Highest education level: Not on file  Occupational History   Occupation: Retired  Tobacco Use   Smoking status: Never   Smokeless tobacco: Never  Vaping Use   Vaping status: Never Used  Substance and Sexual Activity   Alcohol use: No    Alcohol/week: 0.0 standard drinks of alcohol   Drug use: No   Sexual activity: Not Currently  Other Topics Concern   Not on file  Social History Narrative   Patient lives in [place] with his wife. 2nd marriage    4 children    He goes to  [place] [employer] three days a week      09/27/18: Lives at [facility] with wife   Still goes to [facility], 2X/week, bicycles/uses sitting eliptical in room daily   Enjoys singing, part of singing group at center   Active in discussion groups at center         Social Drivers of Health   Financial Resource Strain: Low Risk  (07/22/2023)   Overall Financial Resource Strain (CARDIA)    Difficulty of Paying Living Expenses: Not hard at all  Food Insecurity: No Food Insecurity (07/22/2023)   Hunger Vital Sign    Worried About Running Out of Food in the Last Year: Never true    Ran Out of Food in the Last Year: Never true  Transportation Needs: No Transportation Needs (07/22/2023)   PRAPARE - Administrator, Civil Service (Medical): No    Lack of Transportation (Non-Medical): No  Physical Activity: Insufficiently Active (07/22/2023)   Exercise Vital Sign    Days  of Exercise per Week: 7 days    Minutes of Exercise per Session: 10 min  Stress: No Stress Concern Present (07/22/2023)   [employer] of Occupational Health - Occupational Stress Questionnaire    Feeling of Stress : Not at all  Social Connections: Socially Integrated (07/22/2023)   Social Connection and Isolation Panel    Frequency of Communication with Friends and Family: More than three times a week    Frequency of Social Gatherings with Friends and Family: More than three times a week    Attends Religious Services: More than 4 times per year    Active Member of Clubs or Organizations: Yes    Attends Banker Meetings: More than 4 times per year    Marital Status: Married  Catering Manager Violence: Not At Risk (07/22/2023)   Humiliation, Afraid, Rape, and Kick questionnaire    Fear of Current or Ex-Partner: No    Emotionally Abused: No    Physically Abused: No    Sexually Abused: No    Past Surgical History:  Procedure Laterality Date   ANTERIOR CERVICAL DECOMP/DISCECTOMY FUSION  ~ 2000   Dr. Lucilla with bone graft   CARDIAC CATHETERIZATION N/A 02/20/2015   Procedure: Right/Left Heart Cath and Coronary Angiography;  Surgeon: Ozell Fell, MD;  Location: [facility] INVASIVE CV LAB;  Service: Cardiovascular;  Laterality: N/A;   CARDIAC VALVE REPLACEMENT     CATARACT EXTRACTION W/ INTRAOCULAR LENS  IMPLANT, BILATERAL  2004; 2010   right; left   CYSTOSCOPY W/ RETROGRADES Bilateral 04/22/2020   Procedure: CYSTOSCOPY WITH RETROGRADE PYELOGRAM/FULGERATION BLADDER BIOPSY;  Surgeon: Rosalind Zachary NOVAK, MD;  Location: [facility] ORS;  Service: Urology;  Laterality: Bilateral;   CYSTOSCOPY W/ RETROGRADES Bilateral 09/09/2020   Procedure: CYSTOSCOPY WITH RETROGRADE PYELOGRAM;  Surgeon: Rosalind Zachary NOVAK, MD;  Location[facility];  Service: Urology;  Laterality: Bilateral;   CYSTOSCOPY W/ RETROGRADES Bilateral 10/13/2021   Procedure: CYSTOSCOPY WITH RETROGRADE PYELOGRAM;  Surgeon:  Rosalind Zachary NOVAK, MD;  Location: [facility];  Service: Urology;  Laterality: Bilateral;   EP IMPLANTABLE DEVICE N/A 06/24/2015   Procedure: Pacemaker Implant;  Surgeon: Danelle LELON Birmingham, MD;  Location: [facility] INVASIVE CV LAB;  Service: Cardiovascular; St Jude Assurity    EP IMPLANTABLE DEVICE N/A 06/26/2015   Procedure: Lead Revision/Repair;  Surgeon: Will Gladis Norton, MD;  Location: [facility] INVASIVE CV LAB;  Service: Cardiovascular;  Laterality: N/A;   INSERT / REPLACE / REMOVE PACEMAKER  06/24/2015   JOINT REPLACEMENT  SQUAMOUS CELL CARCINOMA EXCISION Left X 3   lower leg   TEE WITHOUT CARDIOVERSION N/A 03/07/2015   Procedure: TRANSESOPHAGEAL ECHOCARDIOGRAM (TEE);  Surgeon: Leim VEAR Moose, MD;  Location: [facility] ENDOSCOPY;  Service: Cardiovascular;  Laterality: N/A;   TEE WITHOUT CARDIOVERSION N/A 03/25/2015   Procedure: TRANSESOPHAGEAL ECHOCARDIOGRAM (TEE);  Surgeon: Lonni JONETTA Cash, MD;  Location: [facility] OR;  Service: Open Heart Surgery;  Laterality: N/A;   TONSILLECTOMY  as child   TOTAL SHOULDER ARTHROPLASTY Right 2007   wore it out playing tennis   TRANSCATHETER AORTIC VALVE REPLACEMENT, TRANSFEMORAL N/A 03/25/2015   Procedure: TRANSCATHETER AORTIC VALVE REPLACEMENT, TRANSFEMORAL;  Surgeon: Lonni JONETTA Cash, MD;  Location: [facility] OR;  Service: Open Heart Surgery;  Laterality: N/A;   TRANSURETHRAL RESECTION OF BLADDER TUMOR N/A 09/09/2020   Procedure: TRANSURETHRAL RESECTION OF BLADDER TUMOR (TURBT);  Surgeon: Rosalind Zachary NOVAK, MD;  Location: [facility];  Service: Urology;  Laterality: N/A;  30 MINS   TRANSURETHRAL RESECTION OF BLADDER TUMOR N/A 10/13/2021   Procedure: TRANSURETHRAL RESECTION OF BLADDER TUMOR (TURBT);  Surgeon: Rosalind Zachary NOVAK, MD;  Location: [facility] ORS;  Service: Urology;  Laterality: N/A;  30 MINS    Family History  Problem Relation Age of Onset   Heart disease Mother    CVA Mother    Stroke Mother    CVA Father    Cancer Father    Brain cancer Sister     Prostate cancer Brother    Heart disease Brother    Lung cancer Brother    Heart attack Brother    Hypertension Neg Hx     No Known Allergies  Current Outpatient Medications on File Prior to Visit  Medication Sig Dispense Refill   amoxicillin (AMOXIL) 500 MG capsule Take 2,000 mg by mouth See admin instructions. Take 2000 mg 1 hour prior to dental work     benzonatate (TESSALON) 200 MG capsule Take 1 capsule (200 mg total) by mouth 3 (three) times daily as needed. 30 capsule 1   mirabegron ER (MYRBETRIQ) 50 MG TB24 tablet Take 50 mg by mouth every other day. At lunch     Multiple Vitamins-Minerals (PRESERVISION AREDS 2+MULTI VIT PO) Take 1 tablet by mouth 2 (two) times daily.     Omega-3 Fatty Acids (FISH OIL) 1000 MG CAPS Take 1,000 mg by mouth 2 (two) times daily.     polyethylene glycol powder (MIRALAX) 17 GM/SCOOP powder Take 1 Container by mouth as needed.     rosuvastatin  (CRESTOR ) 5 MG tablet TAKE 1 TABLET BY MOUTH DAILY 90 tablet 1   silodosin (RAPAFLO) 8 MG CAPS capsule Take 8 mg by mouth every evening. 1 tablet 30 minutes after evening meals     SODIUM FLUORIDE 5000 PLUS 1.1 % CREA dental cream Place 1 Application onto teeth at bedtime.     warfarin (COUMADIN ) 3 MG tablet TAKE 1/2 TO 1 TABLET BY MOUTH  DAILY AS DIRECTED BY COUMADIN   CLINIC 90 tablet 1   No current facility-administered medications on file prior to visit.    BP (!) 100/50   Pulse 71   Temp 97.7 F (36.5 C)   Ht 5' 8 (1.727 m)   SpO2 96%   BMI 33.45 kg/m       Objective:   Physical Exam Vitals and nursing note reviewed.  Constitutional:      Appearance: Normal appearance.  HENT:     Nose: Nose normal. No congestion or rhinorrhea.     Mouth/Throat:  Mouth: Mucous membranes are moist.     Pharynx: Oropharynx is clear. No oropharyngeal exudate or posterior oropharyngeal erythema.  Cardiovascular:     Rate and Rhythm: Normal rate and regular rhythm.     Pulses: Normal pulses.     Heart sounds:  Normal heart sounds.  Pulmonary:     Effort: Pulmonary effort is normal.     Breath sounds: Normal breath sounds. No stridor. No wheezing, rhonchi or rales.  Musculoskeletal:        General: Normal range of motion.  Skin:    General: Skin is warm and dry.     Capillary Refill: Capillary refill takes less than 2 seconds.  Neurological:     General: No focal deficit present.     Mental Status: He is alert and oriented to person, place, and time.  Psychiatric:        Mood and Affect: Mood normal.        Behavior: Behavior normal.        Thought Content: Thought content normal.        Judgment: Judgment normal.       Assessment & Plan:   Assessment and Plan    Acute cough -likely viral or allergic . No bacterial infection indicated. -Previous cough medicine ineffective. - Advised to monitor sputum color and report changes. - Recommended over-the-counter allergy medication such as zyrtec or claritin - Reassured viral infections resolve spontaneously and allergies may improve with colder weather.      I personally spent a total of 30 minutes in the care of the patient today including preparing to see the patient, getting/reviewing separately obtained history, performing a medically appropriate exam/evaluation, counseling and educating, placing orders, documenting clinical information in the EHR, and listening.

## 2024-07-09 ENCOUNTER — Encounter: Payer: Self-pay | Admitting: Cardiology

## 2024-07-09 ENCOUNTER — Ambulatory Visit: Attending: Physician Assistant | Admitting: Cardiology

## 2024-07-09 VITALS — BP 118/68 | HR 70 | Ht 68.5 in | Wt 212.0 lb

## 2024-07-09 DIAGNOSIS — Z950 Presence of cardiac pacemaker: Secondary | ICD-10-CM | POA: Insufficient documentation

## 2024-07-09 DIAGNOSIS — I442 Atrioventricular block, complete: Secondary | ICD-10-CM | POA: Diagnosis present

## 2024-07-09 DIAGNOSIS — Z952 Presence of prosthetic heart valve: Secondary | ICD-10-CM | POA: Insufficient documentation

## 2024-07-09 DIAGNOSIS — I4891 Unspecified atrial fibrillation: Secondary | ICD-10-CM | POA: Insufficient documentation

## 2024-07-09 LAB — CUP PACEART INCLINIC DEVICE CHECK
Battery Remaining Longevity: 2 mo
Battery Voltage: 2.68 V
Brady Statistic RA Percent Paced: 0 %
Brady Statistic RV Percent Paced: 67 %
Date Time Interrogation Session: 20251124140106
Implantable Lead Connection Status: 753985
Implantable Lead Connection Status: 753985
Implantable Lead Implant Date: 20161108
Implantable Lead Implant Date: 20161108
Implantable Lead Location: 753859
Implantable Lead Location: 753860
Implantable Pulse Generator Implant Date: 20161108
Lead Channel Impedance Value: 400 Ohm
Lead Channel Impedance Value: 450 Ohm
Lead Channel Pacing Threshold Amplitude: 0.5 V
Lead Channel Pacing Threshold Amplitude: 0.625 V
Lead Channel Pacing Threshold Amplitude: 0.75 V
Lead Channel Pacing Threshold Pulse Width: 0.4 ms
Lead Channel Pacing Threshold Pulse Width: 0.5 ms
Lead Channel Pacing Threshold Pulse Width: 0.5 ms
Lead Channel Sensing Intrinsic Amplitude: 12 mV
Lead Channel Sensing Intrinsic Amplitude: 3.9 mV
Lead Channel Setting Pacing Amplitude: 0.875
Lead Channel Setting Pacing Pulse Width: 0.5 ms
Lead Channel Setting Sensing Sensitivity: 5 mV
Pulse Gen Model: 2240
Pulse Gen Serial Number: 7825693

## 2024-07-09 NOTE — Progress Notes (Signed)
  Electrophysiology Office Note:   ID:  Robert Anderson, Robert Anderson [birth year], MRN [id number]  Primary Cardiologist: Peter Jordan, MD Electrophysiologist: Danelle Birmingham, MD      History of Present Illness:   Robert Anderson is a 89 y.o. male with h/o CHB s/p PPM,  AS s/p TAVR, HOCM, HTN, TIA/CVA, longstanding persistent afib on Coumadin , bladder cancer seen today for routine electrophysiology followup.   Since last being seen in our clinic the patient reports doing well overall. He now uses a motorized wheelchair when needing to travel longer distances. he denies chest pain, palpitations, dyspnea, PND, orthopnea, nausea, vomiting, dizziness, syncope, edema, weight gain, or early satiety.   Recently saw his PCP for evaluation of congestion, diagnosed with viral URI. Now feeling much better on Zyrtec.  Review of systems complete and found to be negative unless listed in HPI.   EP Information / Studies Reviewed:    EKG is not ordered today. EKG from 11/23/23 reviewed which showed atrial fibrillation with ventricular pacing.       PPM Interrogation-  reviewed in detail today,  See PACEART report.  Arrhythmia/Device History PPM SJ   Physical Exam:   VS:  There were no vitals taken for this visit.   Wt Readings from Last 3 Encounters:  05/23/24 220 lb (99.8 kg)  01/10/24 211 lb (95.7 kg)  11/23/23 211 lb 3.2 oz (95.8 kg)     GEN: No acute distress  NECK: No JVD; No carotid bruits CARDIAC: Irregularly irregular rate and rhythm, no murmurs, rubs, gallops RESPIRATORY:  Clear to auscultation without rales, wheezing or rhonchi  ABDOMEN: Soft, non-tender, non-distended EXTREMITIES:  No edema; No deformity   ASSESSMENT AND PLAN:    CHB s/p Abbott PPM  Normal PPM function Appropriate sensing and stable thresholds. Given 100% afib for >1 year, reprogrammed VVIR. This change increased battery estimate 2.51m->3.74m. See Elisabeth Art report for full details. Continue monthly remote reports with device nearing ERI.    Permanent atrial fibrillation 100% afib for the past year. Controlled ventricular rates on both remote reports and in clinic device check. Continue Coumadin .   AS s/p TAVR No symptoms suggestive of valvular dysfunction. Continue to follow up with Dr. Jordan (due April 2026).   Hypertrophic Cardiomyopathy 2022 TTE with severe asymmetric septal hypertrophy, no LVOT. Patient without symptoms suggestive of CHF. Continue to follow up with Dr. Jordan (due April 2026).     Disposition:   Follow up with Dr. Almetta or EP APP in 2 months.   Signed, Artist Pouch, PA-C

## 2024-07-09 NOTE — Patient Instructions (Addendum)
 Medication Instructions:   Your physician recommends that you continue on your current medications as directed. Please refer to the Current Medication list given to you today.   *If you need a refill on your cardiac medications before your next appointment, please call your pharmacy*   Lab Work:  NONE ORDERED  TODAY     If you have labs (blood work) drawn today and your tests are completely normal, you will receive your results only by: MyChart Message (if you have MyChart) OR A paper copy in the mail If you have any lab test that is abnormal or we need to change your treatment, we will call you to review the results.    Testing/Procedures: NONE ORDERED  TODAY     Follow-Up: At [facility], you and your health needs are our priority.  As part of our continuing mission to provide you with exceptional heart care, our providers are all part of one team.  This team includes your primary Cardiologist (physician) and Advanced Practice Providers or APPs (Physician Assistants and Nurse Practitioners) who all work together to provide you with the care you need, when you need it.   Your next appointment:   3 month(s)   Provider:   You may see Dr Almetta ( CONTACT  CASSIE HALL/ ANGELINE HAMMER FOR EP SCHEDULING ISSUES )          We recommend signing up for the patient portal called MyChart.  Sign up information is provided on this After Visit Summary.  MyChart is used to connect with patients for Virtual Visits (Telemedicine).  Patients are able to view lab/test results, encounter notes, upcoming appointments, etc.  Non-urgent messages can be sent to your provider as well.   To learn more about what you can do with MyChart, go to [email or web address].   Other Instructions

## 2024-07-11 ENCOUNTER — Ambulatory Visit: Attending: Cardiology | Admitting: *Deleted

## 2024-07-11 DIAGNOSIS — Z5181 Encounter for therapeutic drug level monitoring: Secondary | ICD-10-CM | POA: Insufficient documentation

## 2024-07-11 DIAGNOSIS — Z79899 Other long term (current) drug therapy: Secondary | ICD-10-CM | POA: Insufficient documentation

## 2024-07-11 DIAGNOSIS — I639 Cerebral infarction, unspecified: Secondary | ICD-10-CM | POA: Diagnosis present

## 2024-07-11 DIAGNOSIS — Z952 Presence of prosthetic heart valve: Secondary | ICD-10-CM | POA: Diagnosis present

## 2024-07-11 DIAGNOSIS — I4891 Unspecified atrial fibrillation: Secondary | ICD-10-CM | POA: Diagnosis present

## 2024-07-11 LAB — POCT INR: INR: 2.9 (ref 2.0–3.0)

## 2024-07-11 NOTE — Patient Instructions (Signed)
 Description   INR 2.9; Continue taking warfarin 1/2 tablet daily except 1 tablet on Monday and Friday; Recheck INR in 8 weeks per patient. Coumadin  Clinic [phone] or [phone]. EAT GREENS TWICE PER WEEK.

## 2024-07-11 NOTE — Progress Notes (Signed)
 Description   INR 2.9; Continue taking warfarin 1/2 tablet daily except 1 tablet on Monday and Friday; Recheck INR in 8 weeks per patient. Coumadin  Clinic [phone] or [phone]. EAT GREENS TWICE PER WEEK.

## 2024-07-26 ENCOUNTER — Ambulatory Visit

## 2024-07-27 ENCOUNTER — Encounter

## 2024-07-27 VITALS — Wt 212.0 lb

## 2024-07-27 DIAGNOSIS — Z0000 Encounter for general adult medical examination without abnormal findings: Secondary | ICD-10-CM

## 2024-07-27 LAB — CUP PACEART REMOTE DEVICE CHECK
Battery Remaining Longevity: 1 mo
Battery Remaining Percentage: 2 %
Battery Voltage: 2.68 V
Brady Statistic RV Percent Paced: 43 %
Date Time Interrogation Session: 20251211020013
Implantable Lead Connection Status: 753985
Implantable Lead Connection Status: 753985
Implantable Lead Implant Date: 20161108
Implantable Lead Implant Date: 20161108
Implantable Lead Location: 753859
Implantable Lead Location: 753860
Implantable Pulse Generator Implant Date: 20161108
Lead Channel Impedance Value: 430 Ohm
Lead Channel Pacing Threshold Amplitude: 0.625 V
Lead Channel Pacing Threshold Pulse Width: 0.5 ms
Lead Channel Sensing Intrinsic Amplitude: 12 mV
Lead Channel Setting Pacing Amplitude: 5 V
Lead Channel Setting Pacing Pulse Width: 0.5 ms
Lead Channel Setting Sensing Sensitivity: 5 mV
Pulse Gen Model: 2240
Pulse Gen Serial Number: 7825693

## 2024-07-27 NOTE — Progress Notes (Signed)
 Chief Complaint  Patient presents with   Medicare Wellness   Error     Subjective:   Robert Anderson is a 89 y.o. male who presents for a Medicare Annual Wellness Visit.  Visit info / Clinical Intake: Medicare Wellness Visit Type:: Subsequent Annual Wellness Visit Persons participating in visit and providing information:: patient Medicare Wellness Visit Mode:: Telephone If telephone:: video declined Since this visit was completed virtually, some vitals may be partially provided or unavailable. Missing vitals are due to the limitations of the virtual format.: Documented vitals are patient reported If Telephone or Video please confirm:: I connected with patient using audio/video enable telemedicine. I verified patient identity with two identifiers, discussed telehealth limitations, and patient agreed to proceed. Patient Location:: Home Provider Location:: Office Interpreter Needed?: No Pre-visit prep was completed: no AWV questionnaire completed by patient prior to visit?: no Living arrangements:: lives with spouse/significant other Patient's Overall Health Status Rating: -- (Error) Typical amount of pain: -- (Error) Does pain affect daily life?: -- (Error) Are you currently prescribed opioids?: -- (Error)  Dietary Habits and Nutritional Risks How many meals a day?: -- (Error) Eats fruit and vegetables daily?: -- (Error) Most meals are obtained by: -- (Error) In the last 2 weeks, have you had any of the following?: -- (Error) Diabetic:: -- (Error)  Functional Status Activities of Daily Living (to include ambulation/medication): -- (Error) Ambulation: -- (Error) Medication Administration: -- (Error) Home Management (perform basic housework or laundry): -- (Error) Manage your own finances?: -- (Error) Primary transportation is: -- (Error) Concerns about vision?: -- (Error) Concerns about hearing?: -- (Error)  Fall Screening Falls in the past year?: -- (Error) Number of falls  in past year: -- (Error) Was there an injury with Fall?: -- (Error) Fall Risk Category Calculator: 0 Patient Fall Risk Level: Low Fall Risk  Fall Risk Patient at Risk for Falls Due to: -- (Error) Fall risk Follow up: -- (Error)  Home and Transportation Safety: All rugs have non-skid backing?: -- (Error) All stairs or steps have railings?: -- (Error) Grab bars in the bathtub or shower?: -- (Error) Have non-skid surface in bathtub or shower?: -- (Error) Good home lighting?: -- (Error) Regular seat belt use?: -- (Error) Hospital stays in the last year:: -- (Error)  Cognitive Assessment Difficulty concentrating, remembering, or making decisions? : -- (Error) Will 6CIT or Mini Cog be Completed: -- Magazine Features Editor)  Advance Directives (For Healthcare) Does Patient Have a Medical Advance Directive?: -- (Error)  Reviewed/Updated  Reviewed/Updated: -- (Error)    Allergies (verified) Patient has no known allergies.   Current Medications (verified) Outpatient Encounter Medications as of 07/27/2024  Medication Sig   amoxicillin (AMOXIL) 500 MG capsule Take 2,000 mg by mouth See admin instructions. Take 2000 mg 1 hour prior to dental work   benzonatate  (TESSALON ) 200 MG capsule Take 1 capsule (200 mg total) by mouth 3 (three) times daily as needed.   mirabegron ER (MYRBETRIQ) 50 MG TB24 tablet Take 50 mg by mouth every other day. At lunch   Multiple Vitamins-Minerals (PRESERVISION AREDS 2+MULTI VIT PO) Take 1 tablet by mouth 2 (two) times daily.   Omega-3 Fatty Acids (FISH OIL) 1000 MG CAPS Take 1,000 mg by mouth 2 (two) times daily.   polyethylene glycol powder (MIRALAX) 17 GM/SCOOP powder Take 1 Container by mouth as needed.   rosuvastatin  (CRESTOR ) 5 MG tablet TAKE 1 TABLET BY MOUTH DAILY   silodosin (RAPAFLO) 8 MG CAPS capsule Take 8 mg by mouth every evening.  1 tablet 30 minutes after evening meals   SODIUM FLUORIDE 5000 PLUS 1.1 % CREA dental cream Place 1 Application onto teeth at  bedtime.   warfarin (COUMADIN ) 3 MG tablet TAKE 1/2 TO 1 TABLET BY MOUTH  DAILY AS DIRECTED BY COUMADIN   CLINIC   No facility-administered encounter medications on file as of 07/27/2024.    History: Past Medical History:  Diagnosis Date   Atrial fibrillation (HCC)    a. dx after lead revision 06/26/15 >> anticoag not started due to recent pericardial effusion in setting of perforation and lead revision   Bladder cancer (HCC)    chemo done 2021   Bradycardia    a. HR 30s in clinic 06/2015 - BB discontinued.   DEGENERATIVE JOINT DISEASE, SPINE    ERECTILE DYSFUNCTION    Essential hypertension    First degree AV block    Hearing deficit    wears hearing aides both ears   HEARING LOSS    Heart block    s/p Pacemaker   Hyperlipidemia    Hypertrophic obstructive cardiomyopathy (HCC)    LBBB (left bundle branch block)    Macular degeneration    left eye gets shots for   OA (osteoarthritis)    both hips   Pericardial effusion    a. 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 s/p TAVR 03/2015. Pre-op  LHC 02/2015: minor nonobstructive CAD.   Squamous cell skin cancer    left lower leg   Stroke (HCC) 02/19/2014   Small infarct high posterior left frontal lobe on MRI     TIA (transient ischemic attack) 01/21/2014   5 min spell aphasia without assoc sx     Uses walker    Wears glasses    Past Surgical History:  Procedure Laterality Date   ANTERIOR CERVICAL DECOMP/DISCECTOMY FUSION  ~ 2000   Dr. Lucilla with bone graft   CARDIAC CATHETERIZATION N/A 02/20/2015   Procedure: Right/Left Heart Cath and Coronary Angiography;  Surgeon: Ozell Fell, MD;  Location: [facility]  INVASIVE CV LAB;  Service: Cardiovascular;  Laterality: N/A;   CARDIAC VALVE REPLACEMENT     CATARACT EXTRACTION W/ INTRAOCULAR LENS  IMPLANT, BILATERAL  2004; 2010   right; left   CYSTOSCOPY W/ RETROGRADES Bilateral 04/22/2020   Procedure: CYSTOSCOPY WITH RETROGRADE PYELOGRAM/FULGERATION BLADDER BIOPSY;  Surgeon: Rosalind Zachary NOVAK, MD;  Location: [facility] ORS;  Service: Urology;  Laterality: Bilateral;   CYSTOSCOPY W/ RETROGRADES Bilateral 09/09/2020   Procedure: CYSTOSCOPY WITH RETROGRADE PYELOGRAM;  Surgeon: Rosalind Zachary NOVAK, MD;  Location: [facility];  Service: Urology;  Laterality: Bilateral;   CYSTOSCOPY W/ RETROGRADES Bilateral 10/13/2021   Procedure: CYSTOSCOPY WITH RETROGRADE PYELOGRAM;  Surgeon: Rosalind Zachary NOVAK, MD;  Location: [facility];  Service: Urology;  Laterality: Bilateral;   EP IMPLANTABLE DEVICE N/A 06/24/2015   Procedure: Pacemaker Implant;  Surgeon: Danelle LELON Birmingham, MD;  Location: [facility] INVASIVE CV LAB;  Service: Cardiovascular; St Jude Assurity    EP IMPLANTABLE DEVICE N/A 06/26/2015   Procedure: Lead Revision/Repair;  Surgeon: Will Gladis Norton, MD;  Location: [facility] INVASIVE CV LAB;  Service: Cardiovascular;  Laterality: N/A;   INSERT / REPLACE / REMOVE PACEMAKER  06/24/2015   JOINT REPLACEMENT     SQUAMOUS CELL CARCINOMA EXCISION Left X 3   lower leg   TEE WITHOUT CARDIOVERSION N/A 03/07/2015   Procedure: TRANSESOPHAGEAL ECHOCARDIOGRAM (TEE);  Surgeon: Leim VEAR Moose, MD;  Location: [facility] ENDOSCOPY;  Service: Cardiovascular;  Laterality: N/A;   TEE WITHOUT CARDIOVERSION N/A 03/25/2015   Procedure: TRANSESOPHAGEAL ECHOCARDIOGRAM (TEE);  Surgeon: Lonni JONETTA Cash, MD;  Location: [facility] OR;  Service: Open Heart Surgery;  Laterality: N/A;   TONSILLECTOMY  as child   TOTAL SHOULDER ARTHROPLASTY Right 2007   wore it out playing tennis   TRANSCATHETER AORTIC VALVE REPLACEMENT, TRANSFEMORAL N/A 03/25/2015   Procedure: TRANSCATHETER AORTIC VALVE REPLACEMENT, TRANSFEMORAL;   Surgeon: Lonni JONETTA Cash, MD;  Location: [facility] OR;  Service: Open Heart Surgery;  Laterality: N/A;   TRANSURETHRAL RESECTION OF BLADDER TUMOR N/A 09/09/2020   Procedure: TRANSURETHRAL RESECTION OF BLADDER TUMOR (TURBT);  Surgeon: Rosalind Zachary NOVAK, MD;  Location: [facility];  Service: Urology;  Laterality: N/A;  30 MINS   TRANSURETHRAL RESECTION OF BLADDER TUMOR N/A 10/13/2021   Procedure: TRANSURETHRAL RESECTION OF BLADDER TUMOR (TURBT);  Surgeon: Rosalind Zachary NOVAK, MD;  Location: [facility];  Service: Urology;  Laterality: N/A;  30 MINS   Family History  Problem Relation Age of Onset   Heart disease Mother    CVA Mother    Stroke Mother    CVA Father    Cancer Father    Brain cancer Sister    Prostate cancer Brother    Heart disease Brother    Lung cancer Brother    Heart attack Brother    Hypertension Neg Hx    Social History   Occupational History   Occupation: Retired  Tobacco Use   Smoking status: Never   Smokeless tobacco: Never  Vaping Use   Vaping status: Never Used  Substance and Sexual Activity   Alcohol use: No    Alcohol/week: 0.0 standard drinks of alcohol   Drug use: No   Sexual activity: Not Currently   Tobacco Counseling Counseling given: Not Answered  SDOH Screenings   Food Insecurity: No Food Insecurity (07/22/2023)  Housing: Low Risk (07/22/2023)  Transportation Needs: No Transportation Needs (07/22/2023)  Utilities: Not At Risk (07/22/2023)  Alcohol Screen: Low Risk (07/22/2023)  Depression (PHQ2-9): Low Risk (07/22/2023)  Financial Resource Strain: Low Risk (07/22/2023)  Physical Activity: Insufficiently Active (07/22/2023)  Social Connections: Socially Integrated (07/22/2023)  Stress: No Stress Concern Present (07/22/2023)  Tobacco Use: Low Risk (07/27/2024)  Health Literacy: Adequate Health Literacy (07/22/2023)   See flowsheets for full screening details  Depression Screen Depression Screening Exception Documentation Depression  Screening Exception:: -- (Error)  PHQ 2 & 9 Depression Scale- Over the past 2 weeks, how often have you been bothered by any of the following problems? Little interest or pleasure in doing things: -- (Error) Feeling down, depressed, or hopeless (PHQ Adolescent also includes...irritable): -- (Error)     Goals Addressed   None          Objective:    Today's Vitals   07/27/24 1356  Weight: 212 lb (96.2 kg)   Body mass index is 31.77 kg/m.  Hearing/Vision screen Hearing Screening - Comments:: Error Vision Screening - Comments:: Error Immunizations and Health Maintenance Health Maintenance  Topic Date Due   Zoster Vaccines- Shingrix (1 of 2) 07/14/1943   Influenza Vaccine  03/16/2024   COVID-19 Vaccine (5 - 2025-26 season) 04/16/2024   Medicare Annual Wellness (AWV)  07/21/2024  DTaP/Tdap/Td (3 - Tdap) 05/26/2025   Pneumococcal Vaccine: 50+ Years  Completed   Meningococcal B Vaccine  Aged Out        Assessment/Plan:  This is a routine wellness examination for Walfred.  Patient Care Team: Merna Huxley, NP as PCP - General (Family Medicine) Waddell Danelle ORN, MD as PCP - Electrophysiology (Cardiology) Jordan, Peter M, MD as PCP - Cardiology (Cardiology) Verlin Lonni BIRCH, MD as Consulting Physician (Cardiology) Waddell Danelle ORN, MD as Consulting Physician (Clinical Cardiac Electrophysiology) Joshua Sieving, MD as Consulting Physician (Dermatology) Raj Rankin SAUNDERS, MD as Referring Physician (Ophthalmology) Ceil Anes, MD (Inactive) as Consulting Physician (Urology)  I have personally reviewed and noted the following in the patients chart:   Medical and social history Use of alcohol, tobacco or illicit drugs  Current medications and supplements including opioid prescriptions. Functional ability and status Nutritional status Physical activity Advanced directives List of other physicians Hospitalizations, surgeries, and ER visits in previous 12  months Vitals Screenings to include cognitive, depression, and falls Referrals and appointments  No orders of the defined types were placed in this encounter.  In addition, I have reviewed and discussed with patient certain preventive protocols, quality metrics, and best practice recommendations. A written personalized care plan for preventive services as well as general preventive health recommendations were provided to patient.   Rojelio ORN Blush, LPN   [id number]   Return in 1 year (on 07/27/2025).  After Visit Summary:   Nurse Notes: This encounter was created in error - please disregard.

## 2024-07-27 NOTE — Patient Instructions (Addendum)
 Mr. Wray,  Thank you for taking the time for your Medicare Wellness Visit. I appreciate your continued commitment to your health goals. Please review the care plan we discussed, and feel free to reach out if I can assist you further.  Please note that Annual Wellness Visits do not include a physical exam. Some assessments may be limited, especially if the visit was conducted virtually. If needed, we may recommend an in-person follow-up with your provider.  Ongoing Care Seeing your primary care provider every 3 to 6 months helps us  monitor your health and provide consistent, personalized care.   Referrals If a referral was made during today's visit and you haven't received any updates within two weeks, please contact the referred provider directly to check on the status.  Recommended Screenings:  Health Maintenance  Topic Date Due   Zoster (Shingles) Vaccine (1 of 2) 07/14/1943   Flu Shot  03/16/2024   COVID-19 Vaccine (5 - 2025-26 season) 04/16/2024   Medicare Annual Wellness Visit  07/21/2024   DTaP/Tdap/Td vaccine (3 - Tdap) 05/26/2025   Pneumococcal Vaccine for age over 31  Completed   Meningitis B Vaccine  Aged Out       07/27/2024    2:03 PM  Advanced Directives  Does Patient Have a Medical Advance Directive? --    Vision: Annual vision screenings are recommended for early detection of glaucoma, cataracts, and diabetic retinopathy. These exams can also reveal signs of chronic conditions such as diabetes and high blood pressure.  Dental: Annual dental screenings help detect early signs of oral cancer, gum disease, and other conditions linked to overall health, including heart disease and diabetes.  Please see the attached documents for additional preventive care recommendations.

## 2024-07-27 NOTE — Progress Notes (Deleted)
 Chief Complaint  Patient presents with   Medicare Wellness   Error     Subjective:   Robert Anderson is a 89 y.o. male who presents for a Medicare Annual Wellness Visit.  Visit info / Clinical Intake: Medicare Wellness Visit Type:: Subsequent Annual Wellness Visit Persons participating in visit and providing information:: patient Medicare Wellness Visit Mode:: Telephone If telephone:: video declined Since this visit was completed virtually, some vitals may be partially provided or unavailable. Missing vitals are due to the limitations of the virtual format.: Documented vitals are patient reported If Telephone or Video please confirm:: I connected with patient using audio/video enable telemedicine. I verified patient identity with two identifiers, discussed telehealth limitations, and patient agreed to proceed. Patient Location:: Home Provider Location:: Office Interpreter Needed?: No Pre-visit prep was completed: no AWV questionnaire completed by patient prior to visit?: no Living arrangements:: lives with spouse/significant other Patient's Overall Health Status Rating: -- (Error) Typical amount of pain: -- (Error) Does pain affect daily life?: -- (Error) Are you currently prescribed opioids?: -- (Error)  Dietary Habits and Nutritional Risks How many meals a day?: -- (Error) Eats fruit and vegetables daily?: -- (Error) Most meals are obtained by: -- (Error) In the last 2 weeks, have you had any of the following?: -- (Error) Diabetic:: -- (Error)  Functional Status Activities of Daily Living (to include ambulation/medication): -- (Error) Ambulation: -- (Error) Medication Administration: -- (Error) Home Management (perform basic housework or laundry): -- (Error) Manage your own finances?: -- (Error) Primary transportation is: -- (Error) Concerns about vision?: -- (Error) Concerns about hearing?: -- (Error)  Fall Screening Falls in the past year?: -- (Error) Number of falls  in past year: -- (Error) Was there an injury with Fall?: -- (Error) Fall Risk Category Calculator: 0 Patient Fall Risk Level: Low Fall Risk  Fall Risk Patient at Risk for Falls Due to: -- (Error) Fall risk Follow up: -- (Error)  Home and Transportation Safety: All rugs have non-skid backing?: -- (Error) All stairs or steps have railings?: -- (Error) Grab bars in the bathtub or shower?: -- (Error) Have non-skid surface in bathtub or shower?: -- (Error) Good home lighting?: -- (Error) Regular seat belt use?: -- (Error) Hospital stays in the last year:: -- (Error)  Cognitive Assessment Difficulty concentrating, remembering, or making decisions? : -- (Error) Will 6CIT or Mini Cog be Completed: -- Magazine Features Editor)  Advance Directives (For Healthcare) Does Patient Have a Medical Advance Directive?: -- (Error)  Reviewed/Updated  Reviewed/Updated: -- (Error)    Allergies (verified) Patient has no known allergies.   Current Medications (verified) Outpatient Encounter Medications as of 07/27/2024  Medication Sig   amoxicillin (AMOXIL) 500 MG capsule Take 2,000 mg by mouth See admin instructions. Take 2000 mg 1 hour prior to dental work   benzonatate  (TESSALON ) 200 MG capsule Take 1 capsule (200 mg total) by mouth 3 (three) times daily as needed.   mirabegron ER (MYRBETRIQ) 50 MG TB24 tablet Take 50 mg by mouth every other day. At lunch   Multiple Vitamins-Minerals (PRESERVISION AREDS 2+MULTI VIT PO) Take 1 tablet by mouth 2 (two) times daily.   Omega-3 Fatty Acids (FISH OIL) 1000 MG CAPS Take 1,000 mg by mouth 2 (two) times daily.   polyethylene glycol powder (MIRALAX) 17 GM/SCOOP powder Take 1 Container by mouth as needed.   rosuvastatin  (CRESTOR ) 5 MG tablet TAKE 1 TABLET BY MOUTH DAILY   silodosin (RAPAFLO) 8 MG CAPS capsule Take 8 mg by mouth every evening.  1 tablet 30 minutes after evening meals   SODIUM FLUORIDE 5000 PLUS 1.1 % CREA dental cream Place 1 Application onto teeth at  bedtime.   warfarin (COUMADIN ) 3 MG tablet TAKE 1/2 TO 1 TABLET BY MOUTH  DAILY AS DIRECTED BY COUMADIN   CLINIC   No facility-administered encounter medications on file as of 07/27/2024.    History: Past Medical History:  Diagnosis Date   Atrial fibrillation (HCC)    a. dx after lead revision 06/26/15 >> anticoag not started due to recent pericardial effusion in setting of perforation and lead revision   Bladder cancer (HCC)    chemo done 2021   Bradycardia    a. HR 30s in clinic 06/2015 - BB discontinued.   DEGENERATIVE JOINT DISEASE, SPINE    ERECTILE DYSFUNCTION    Essential hypertension    First degree AV block    Hearing deficit    wears hearing aides both ears   HEARING LOSS    Heart block    s/p Pacemaker   Hyperlipidemia    Hypertrophic obstructive cardiomyopathy (HCC)    LBBB (left bundle branch block)    Macular degeneration    left eye gets shots for   OA (osteoarthritis)    both hips   Pericardial effusion    a. 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 s/p TAVR 03/2015. Pre-op  LHC 02/2015: minor nonobstructive CAD.   Squamous cell skin cancer    left lower leg   Stroke (HCC) 02/19/2014   Small infarct high posterior left frontal lobe on MRI     TIA (transient ischemic attack) 01/21/2014   5 min spell aphasia without assoc sx     Uses walker    Wears glasses    Past Surgical History:  Procedure Laterality Date   ANTERIOR CERVICAL DECOMP/DISCECTOMY FUSION  ~ 2000   Dr. Lucilla with bone graft   CARDIAC CATHETERIZATION N/A 02/20/2015   Procedure: Right/Left Heart Cath and Coronary Angiography;  Surgeon: Ozell Fell, MD;  Location: [facility]  INVASIVE CV LAB;  Service: Cardiovascular;  Laterality: N/A;   CARDIAC VALVE REPLACEMENT     CATARACT EXTRACTION W/ INTRAOCULAR LENS  IMPLANT, BILATERAL  2004; 2010   right; left   CYSTOSCOPY W/ RETROGRADES Bilateral 04/22/2020   Procedure: CYSTOSCOPY WITH RETROGRADE PYELOGRAM/FULGERATION BLADDER BIOPSY;  Surgeon: Rosalind Zachary NOVAK, MD;  Location: [facility] ORS;  Service: Urology;  Laterality: Bilateral;   CYSTOSCOPY W/ RETROGRADES Bilateral 09/09/2020   Procedure: CYSTOSCOPY WITH RETROGRADE PYELOGRAM;  Surgeon: Rosalind Zachary NOVAK, MD;  [facility];  Service: Urology;  Laterality: Bilateral;   CYSTOSCOPY W/ RETROGRADES Bilateral 10/13/2021   Procedure: CYSTOSCOPY WITH RETROGRADE PYELOGRAM;  Surgeon: Rosalind Zachary NOVAK, MD;  Location: [facility];  Service: Urology;  Laterality: Bilateral;   EP IMPLANTABLE DEVICE N/A 06/24/2015   Procedure: Pacemaker Implant;  Surgeon: Danelle LELON Birmingham, MD;  Location: [facility] INVASIVE CV LAB;  Service: Cardiovascular; St Jude Assurity    EP IMPLANTABLE DEVICE N/A 06/26/2015   Procedure: Lead Revision/Repair;  Surgeon: Will Gladis Norton, MD;  Location: [facility] INVASIVE CV LAB;  Service: Cardiovascular;  Laterality: N/A;   INSERT / REPLACE / REMOVE PACEMAKER  06/24/2015   JOINT REPLACEMENT     SQUAMOUS CELL CARCINOMA EXCISION Left X 3   lower leg   TEE WITHOUT CARDIOVERSION N/A 03/07/2015   Procedure: TRANSESOPHAGEAL ECHOCARDIOGRAM (TEE);  Surgeon: Leim VEAR Moose, MD;  Location: [facility] ENDOSCOPY;  Service: Cardiovascular;  Laterality: N/A;   TEE WITHOUT CARDIOVERSION N/A 03/25/2015   Procedure: TRANSESOPHAGEAL ECHOCARDIOGRAM (TEE);  Surgeon: Lonni JONETTA Cash, MD;  Location: [facility] OR;  Service: Open Heart Surgery;  Laterality: N/A;   TONSILLECTOMY  as child   TOTAL SHOULDER ARTHROPLASTY Right 2007   wore it out playing tennis   TRANSCATHETER AORTIC VALVE REPLACEMENT, TRANSFEMORAL N/A 03/25/2015   Procedure: TRANSCATHETER AORTIC VALVE REPLACEMENT, TRANSFEMORAL;   Surgeon: Lonni JONETTA Cash, MD;  Location: [facility] OR;  Service: Open Heart Surgery;  Laterality: N/A;   TRANSURETHRAL RESECTION OF BLADDER TUMOR N/A 09/09/2020   Procedure: TRANSURETHRAL RESECTION OF BLADDER TUMOR (TURBT);  Surgeon: Rosalind Zachary NOVAK, MD;  Location: [facility];  Service: Urology;  Laterality: N/A;  30 MINS   TRANSURETHRAL RESECTION OF BLADDER TUMOR N/A 10/13/2021   Procedure: TRANSURETHRAL RESECTION OF BLADDER TUMOR (TURBT);  Surgeon: Rosalind Zachary NOVAK, MD;  Location: [facility];  Service: Urology;  Laterality: N/A;  30 MINS   Family History  Problem Relation Age of Onset   Heart disease Mother    CVA Mother    Stroke Mother    CVA Father    Cancer Father    Brain cancer Sister    Prostate cancer Brother    Heart disease Brother    Lung cancer Brother    Heart attack Brother    Hypertension Neg Hx    Social History   Occupational History   Occupation: Retired  Tobacco Use   Smoking status: Never   Smokeless tobacco: Never  Vaping Use   Vaping status: Never Used  Substance and Sexual Activity   Alcohol use: No    Alcohol/week: 0.0 standard drinks of alcohol   Drug use: No   Sexual activity: Not Currently   Tobacco Counseling Counseling given: Not Answered  SDOH Screenings   Food Insecurity: No Food Insecurity (07/22/2023)  Housing: Low Risk (07/22/2023)  Transportation Needs: No Transportation Needs (07/22/2023)  Utilities: Not At Risk (07/22/2023)  Alcohol Screen: Low Risk (07/22/2023)  Depression (PHQ2-9): Low Risk (07/22/2023)  Financial Resource Strain: Low Risk (07/22/2023)  Physical Activity: Insufficiently Active (07/22/2023)  Social Connections: Socially Integrated (07/22/2023)  Stress: No Stress Concern Present (07/22/2023)  Tobacco Use: Low Risk (07/27/2024)  Health Literacy: Adequate Health Literacy (07/22/2023)   See flowsheets for full screening details  Depression Screen Depression Screening Exception Documentation Depression  Screening Exception:: -- (Error)  PHQ 2 & 9 Depression Scale- Over the past 2 weeks, how often have you been bothered by any of the following problems? Little interest or pleasure in doing things: -- (Error) Feeling down, depressed, or hopeless (PHQ Adolescent also includes...irritable): -- (Error)     Goals Addressed   None          Objective:    Today's Vitals   07/27/24 1356  Weight: 212 lb (96.2 kg)   Body mass index is 31.77 kg/m.  Hearing/Vision screen Hearing Screening - Comments:: Error Vision Screening - Comments:: Error Immunizations and Health Maintenance Health Maintenance  Topic Date Due   Zoster Vaccines- Shingrix (1 of 2) 07/14/1943   Influenza Vaccine  03/16/2024   COVID-19 Vaccine (5 - 2025-26 season) 04/16/2024   Medicare Annual Wellness (AWV)  07/21/2024  DTaP/Tdap/Td (3 - Tdap) 05/26/2025   Pneumococcal Vaccine: 50+ Years  Completed   Meningococcal B Vaccine  Aged Out        Assessment/Plan:  This is a routine wellness examination for Robert Anderson.  Patient Care Team: Merna Huxley, NP as PCP - General (Family Medicine) Waddell Danelle ORN, MD as PCP - Electrophysiology (Cardiology) Jordan, Peter M, MD as PCP - Cardiology (Cardiology) Verlin Lonni BIRCH, MD as Consulting Physician (Cardiology) Waddell Danelle ORN, MD as Consulting Physician (Clinical Cardiac Electrophysiology) Joshua Sieving, MD as Consulting Physician (Dermatology) Raj Rankin SAUNDERS, MD as Referring Physician (Ophthalmology) Ceil Anes, MD (Inactive) as Consulting Physician (Urology)  I have personally reviewed and noted the following in the patients chart:   Medical and social history Use of alcohol, tobacco or illicit drugs  Current medications and supplements including opioid prescriptions. Functional ability and status Nutritional status Physical activity Advanced directives List of other physicians Hospitalizations, surgeries, and ER visits in previous 12  months Vitals Screenings to include cognitive, depression, and falls Referrals and appointments  No orders of the defined types were placed in this encounter.  In addition, I have reviewed and discussed with patient certain preventive protocols, quality metrics, and best practice recommendations. A written personalized care plan for preventive services as well as general preventive health recommendations were provided to patient.   Rojelio ORN Blush, LPN   [id number]   Return in 1 year (on 07/27/2025).  After Visit Summary:   Nurse Notes: Patient will reschedule.This encounter was created in error - please disregard.This encounter was created in error - please disregard.

## 2024-07-29 ENCOUNTER — Other Ambulatory Visit: Payer: Self-pay | Admitting: Cardiology

## 2024-07-29 ENCOUNTER — Ambulatory Visit: Payer: Self-pay | Admitting: Internal Medicine

## 2024-07-29 DIAGNOSIS — I10 Essential (primary) hypertension: Secondary | ICD-10-CM

## 2024-07-29 DIAGNOSIS — Z952 Presence of prosthetic heart valve: Secondary | ICD-10-CM

## 2024-07-29 DIAGNOSIS — E785 Hyperlipidemia, unspecified: Secondary | ICD-10-CM

## 2024-08-01 ENCOUNTER — Other Ambulatory Visit: Payer: Self-pay | Admitting: Cardiology

## 2024-08-01 DIAGNOSIS — I4891 Unspecified atrial fibrillation: Secondary | ICD-10-CM

## 2024-08-02 ENCOUNTER — Ambulatory Visit: Admitting: Family Medicine

## 2024-08-02 DIAGNOSIS — Z0000 Encounter for general adult medical examination without abnormal findings: Secondary | ICD-10-CM

## 2024-08-02 NOTE — Patient Instructions (Addendum)
 I really enjoyed getting to talk with you today! I am available on Tuesdays and Thursdays for virtual visits if you have any questions or concerns, or if I can be of any further assistance.   CHECKLIST FROM ANNUAL WELLNESS VISIT:  -Follow up (please call to schedule if not scheduled after visit):   -yearly for annual wellness visit with primary care office  Here is a list of your preventive care/health maintenance measures and the plan for each if any are due:  PLAN For any measures below that may be due:    1. If you wish to get the shingles, flu or covid vaccines you may do so at the pharmacy. Please let us  know if you do so that we can update your record.   Health Maintenance  Topic Date Due   Zoster Vaccines- Shingrix (1 of 2) 07/14/1943   Influenza Vaccine  03/16/2024   COVID-19 Vaccine (5 - 2025-26 season) 04/16/2024   Medicare Annual Wellness (AWV)  07/21/2024   DTaP/Tdap/Td (3 - Tdap) 05/26/2025   Pneumococcal Vaccine: 50+ Years  Completed   Meningococcal B Vaccine  Aged Out    -See a dentist at least yearly  -Get your eyes checked and then per your eye specialist's recommendations  -Other issues addressed today:   1. Please bring a copy of the advanced directives to [facility].    -I have included below further information regarding a healthy whole foods based diet, physical activity guidelines for adults, stress management and opportunities for social connections. I hope you find this information useful.   -----------------------------------------------------------------------------------------------------------------------------------------------------------------------------------------------------------------------------------------------------------    NUTRITION: -eat real food: lots of colorful vegetables (half the plate) and fruits -5-7 servings of vegetables and fruits per day (fresh or steamed is best), exp. 2 servings of vegetables with lunch and dinner and 2  servings of fruit per day. Berries and greens such as kale and collards are great choices.  -consume on a regular basis:  fresh fruits, fresh veggies, fish, nuts, seeds, healthy oils (such as olive oil, avocado oil), whole grains (make sure for bread/pasta/crackers/etc., that the first ingredient on label contains the word whole), legumes. -can eat small amounts of dairy and lean meat (no larger than the palm of your hand), but avoid processed meats such as ham, bacon, lunch meat, etc. -drink water  -try to avoid fast food and pre-packaged foods, processed meat, ultra processed foods/beverages (donuts, candy, etc.) -most experts advise limiting sodium to < 2300mg  per day, should limit further is any chronic conditions such as high blood pressure, heart disease, diabetes, etc. The American Heart Association advised that < 1500mg  is is ideal -try to avoid foods/beverages that contain any ingredients with names you do not recognize  -try to avoid foods/beverages  with added sugar or sweeteners/sweets  -try to avoid sweet drinks (including diet drinks): soda, juice, Gatorade, sweet tea, power drinks, diet drinks -try to avoid white rice, white bread, pasta (unless whole grain)  EXERCISE GUIDELINES FOR ADULTS: -if you wish to increase your physical activity, do so gradually and with the approval of your doctor -STOP and seek medical care immediately if you have any chest pain, chest discomfort or trouble breathing when starting or increasing exercise  -move and stretch your body, legs, feet and arms when sitting for long periods -Physical activity guidelines for optimal health in adults: -get at least 150 minutes per week of moderate exercise (can talk, but not sing); this is about 20-30 minutes of sustained activity 5-7 days per week or  two 10-15 minute episodes of sustained activity 5-7 days per week -do some muscle building/resistance training/strength training at least 2 days per week  -balance  exercises 3+ days per week:   Stand somewhere where you have something sturdy to hold onto if you lose balance    1) lift up on toes, then back down, start with 5x per day and work up to 20x   2) stand and lift one leg straight out to the side so that foot is a few inches of the floor, start with 5x each side and work up to 20x each side   3) stand on one foot, start with 5 seconds each side and work up to 20 seconds on each side  If you need ideas or help with getting more active:  -Silver sneakers https://tools.silversneakers.com  -Walk with a Doc: Http://www.duncan-williams.com/  -try to include resistance (weight lifting/strength building) and balance exercises twice per week: or the following link for ideas: http://castillo-powell.com/  buyducts.dk  STRESS MANAGEMENT: -can try meditating, or just sitting quietly with deep breathing while intentionally relaxing all parts of your body for 5 minutes daily -if you need further help with stress, anxiety or depression please follow up with your primary doctor or contact the wonderful folks at [facility]: [phone]  SOCIAL CONNECTIONS: -options in Ages if you wish to engage in more social and exercise related activities:  -Silver sneakers https://tools.silversneakers.com  -Walk with a Doc: Http://www.duncan-williams.com/  -Check out the [facility] Active Adults 50+ section on the [place] of [employer] companies (hiking clubs, book clubs, cards and games, chess, exercise classes, aquatic classes and much more) - see the website for details: https://www.[place].gov/departments/parks-recreation/active-adults50  -YouTube has lots of exercise videos for different ages and abilities as well  -[facility] (a variety of indoor and outdoor inperson activities for adults). 3102132578. [address]  -Virtual Online  Classes (a variety of topics): see seniorplanet.org or call [phone]  -consider volunteering at a school, hospice center, church, senior center or elsewhere

## 2024-08-02 NOTE — Progress Notes (Signed)
 ----------------------------------------------------------------------------------------------------------------------------------------------------------------------------------------------------------------------  Because this visit was a virtual/telehealth visit, some criteria may be missing or patient reported. Any vitals not documented were not able to be obtained and vitals that have been documented are patient reported.    MEDICARE ANNUAL PREVENTIVE CARE VISIT WITH PROVIDER (Welcome to Medicare, initial annual wellness or annual wellness exam)  Virtual Visit via Phone Note  I connected with Robert Anderson on 08/02/2024  by phone and verified that I am speaking with the correct person using two identifiers.  Location patient: home Location provider:work or home office Persons participating in the virtual visit: patient, provider, patient's wife.   Concerns and/or follow up today: detailed intake and health/risks assessment completed on flow sheets and below- please see for details. They live at [place] and report loving it there. They tried to do an AWV a few days ago but wife was not there to help and he has difficulty hearing - so she is present today to assist.   How often do you have a drink containing alcohol?n How many drinks containing alcohol do you have on a typical day when you are drinking?na How often do you have six or more drinks on one occasion?na Have you ever smoked?n Quit date if applicable? na  How many packs a day do/did you smoke? na Do you use smokeless tobacco?n Do you use an illicit drugs?n Do you feel safe at home?y Last dentist visit?goes to the dentist 2 times per year Last eye Exam and location?sees doctor Hanes, every 3-4 months for macular degeneration   See HM section in Epic for other details of completed HM.    ROS: negative for report of fevers, unintentional weight loss, vision changes, vision loss, hearing loss or change, chest pain,  sob, hemoptysis, melena, hematochezia, hematuria,bleeding or bruising  Patient-completed extensive health risk assessment - reviewed and discussed with the patient: See Health Risk Assessment completed with patient prior to the visit either above or in recent phone note. This was reviewed in detailed with the patient today and appropriate recommendations, orders and referrals were placed as needed per Summary below and patient instructions.   Review of Medical History: -PMH, PSH, Family History and current specialty and care providers reviewed and updated and listed below   Patient Care Team: Merna Huxley, NP as PCP - General (Family Medicine) Waddell Danelle ORN, MD as PCP - Electrophysiology (Cardiology) Jordan, Peter M, MD as PCP - Cardiology (Cardiology) Verlin Lonni BIRCH, MD as Consulting Physician (Cardiology) Waddell Danelle ORN, MD as Consulting Physician (Clinical Cardiac Electrophysiology) Joshua Sieving, MD as Consulting Physician (Dermatology) Raj Rankin SAUNDERS, MD as Referring Physician (Ophthalmology) Ceil Anes, MD (Inactive) as Consulting Physician (Urology)   Past Medical History:  Diagnosis Date   Atrial fibrillation [facility])    a. dx after lead revision 06/26/15 >> anticoag not started due to recent pericardial effusion in setting of perforation and lead revision   Bladder cancer (HCC)    chemo done 2021   Bradycardia    a. HR 30s in clinic 06/2015 - BB discontinued.   DEGENERATIVE JOINT DISEASE, SPINE    ERECTILE DYSFUNCTION    Essential hypertension    First degree AV block    Hearing deficit    wears hearing aides both ears   HEARING LOSS    Heart block    s/p Pacemaker   Hyperlipidemia    Hypertrophic obstructive cardiomyopathy (HCC)    LBBB (left bundle branch block)    Macular degeneration  left eye gets shots for   OA (osteoarthritis)    both hips   Pericardial effusion    a. 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 s/p TAVR 03/2015. Pre-op  LHC 02/2015: minor nonobstructive CAD.   Squamous cell skin cancer    left lower leg   Stroke (HCC) 02/19/2014   Small infarct high posterior left frontal lobe on MRI     TIA (transient ischemic attack) 01/21/2014   5 min spell aphasia without assoc sx     Uses walker    Wears glasses     Past Surgical History:  Procedure Laterality Date   ANTERIOR CERVICAL DECOMP/DISCECTOMY FUSION  ~ 2000   Dr. Lucilla with bone graft   CARDIAC CATHETERIZATION N/A 02/20/2015   Procedure: Right/Left Heart Cath and Coronary Angiography;  Surgeon: Ozell Fell, MD;  Location: [facility] INVASIVE CV LAB;  Service: Cardiovascular;  Laterality: N/A;   CARDIAC VALVE REPLACEMENT     CATARACT EXTRACTION W/ INTRAOCULAR LENS  IMPLANT, BILATERAL  2004; 2010   right; left   CYSTOSCOPY W/ RETROGRADES Bilateral 04/22/2020   Procedure: CYSTOSCOPY WITH RETROGRADE PYELOGRAM/FULGERATION BLADDER BIOPSY;  Surgeon: Rosalind Zachary NOVAK, MD;  Location: [facility] ORS;  Service: Urology;  Laterality: Bilateral;   CYSTOSCOPY W/ RETROGRADES Bilateral 09/09/2020   Procedure: CYSTOSCOPY WITH RETROGRADE PYELOGRAM;  Surgeon: Rosalind Zachary NOVAK, MD;  Location: [facility];  Service: Urology;  Laterality: Bilateral;   CYSTOSCOPY W/ RETROGRADES Bilateral 10/13/2021   Procedure: CYSTOSCOPY WITH RETROGRADE PYELOGRAM;  Surgeon: Rosalind Zachary NOVAK, MD;  Location: [facility] ORS;  Service: Urology;  Laterality: Bilateral;   EP IMPLANTABLE DEVICE N/A 06/24/2015   Procedure: Pacemaker Implant;  Surgeon: Danelle LELON Birmingham, MD;  Location: [facility] INVASIVE CV LAB;  Service: Cardiovascular; St Jude Assurity    EP IMPLANTABLE DEVICE N/A 06/26/2015    Procedure: Lead Revision/Repair;  Surgeon: Will Gladis Norton, MD;  Location: [facility] INVASIVE CV LAB;  Service: Cardiovascular;  Laterality: N/A;   INSERT / REPLACE / REMOVE PACEMAKER  06/24/2015   JOINT REPLACEMENT     SQUAMOUS CELL CARCINOMA EXCISION Left X 3   lower leg   TEE WITHOUT CARDIOVERSION N/A 03/07/2015   Procedure: TRANSESOPHAGEAL ECHOCARDIOGRAM (TEE);  Surgeon: Leim VEAR Moose, MD;  Location: [facility] ENDOSCOPY;  Service: Cardiovascular;  Laterality: N/A;   TEE WITHOUT CARDIOVERSION N/A 03/25/2015   Procedure: TRANSESOPHAGEAL ECHOCARDIOGRAM (TEE);  Surgeon: Lonni JONETTA Cash, MD;  Location: [facility] OR;  Service: Open Heart Surgery;  Laterality: N/A;   TONSILLECTOMY  as child   TOTAL SHOULDER ARTHROPLASTY Right 2007   wore it out playing tennis   TRANSCATHETER AORTIC VALVE REPLACEMENT, TRANSFEMORAL N/A 03/25/2015   Procedure: TRANSCATHETER AORTIC VALVE REPLACEMENT, TRANSFEMORAL;  Surgeon: Lonni JONETTA Cash, MD;  Location: [facility] OR;  Service: Open Heart Surgery;  Laterality: N/A;   TRANSURETHRAL RESECTION OF BLADDER TUMOR N/A 09/09/2020   Procedure: TRANSURETHRAL RESECTION OF BLADDER TUMOR (TURBT);  Surgeon: Rosalind Zachary NOVAK, MD;  Location: [facility];  Service: Urology;  Laterality: N/A;  30 MINS   TRANSURETHRAL RESECTION OF BLADDER TUMOR N/A 10/13/2021   Procedure: TRANSURETHRAL RESECTION OF BLADDER TUMOR (TURBT);  Surgeon: Rosalind Zachary NOVAK, MD;  Location: [facility] ORS;  Service: Urology;  Laterality: N/A;  30 MINS    Social History   Socioeconomic History   Marital status: Married    Spouse name: Not on file   Number of children: 4   Years of education: 16   Highest education level: Not on file  Occupational History   Occupation: Retired  Tobacco Use   Smoking status: Never   Smokeless tobacco: Never  Vaping Use   Vaping status: Never Used  Substance and Sexual Activity   Alcohol use: No    Alcohol/week: 0.0 standard drinks of alcohol   Drug use: No   Sexual  activity: Not Currently  Other Topics Concern   Not on file  Social History Narrative   Patient lives in [place] with his wife. 2nd marriage    4 children    [facility] three days a week      09/27/18: Lives at [place]   Still goes to [facility], 2X/week, bicycles/uses sitting eliptical in room daily   Enjoys singing, part of singing group at center   Active in discussion groups at center         Social Drivers of Health   Tobacco Use: Low Risk (07/27/2024)   Patient History    Smoking Tobacco Use: Never    Smokeless Tobacco Use: Never    Passive Exposure: Not on file  Financial Resource Strain: Low Risk (07/22/2023)   Overall Financial Resource Strain (CARDIA)    Difficulty of Paying Living Expenses: Not hard at all  Food Insecurity: No Food Insecurity (07/22/2023)   Hunger Vital Sign    Worried About Running Out of Food in the Last Year: Never true    Ran Out of Food in the Last Year: Never true  Transportation Needs: No Transportation Needs (07/22/2023)   PRAPARE - Administrator, Civil Service (Medical): No    Lack of Transportation (Non-Medical): No  Physical Activity: Insufficiently Active (08/02/2024)   Exercise Vital Sign    Days of Exercise per Week: 7 days    Minutes of Exercise per Session: 20 min  Stress: No Stress Concern Present (08/02/2024)   [employer] of Occupational Health - Occupational Stress Questionnaire    Feeling of Stress: Not at all  Social Connections: Socially Integrated (08/02/2024)   Social Connection and Isolation Panel    Frequency of Communication with Friends and Family: Never    Frequency of Social Gatherings with Friends and Family: More than three times a week    Attends Religious Services: More than 4 times per year    Active Member of [employer] or Organizations: Yes    Attends Banker Meetings: Never    Marital Status: Married  Catering Manager Violence: Not At Risk  (07/22/2023)   Humiliation, Afraid, Rape, and Kick questionnaire    Fear of Current or Ex-Partner: No    Emotionally Abused: No    Physically Abused: No    Sexually Abused: No  Depression (PHQ2-9): Low Risk (08/02/2024)   Depression (PHQ2-9)    PHQ-2 Score: 0  Alcohol Screen: Low Risk (07/22/2023)   Alcohol Screen    Last Alcohol Screening Score (AUDIT): 0  Housing: Low Risk (07/22/2023)   Housing    Last Housing Risk Score: 0  Utilities: Not At Risk (07/22/2023)   AHC Utilities    Threatened with loss of utilities: No  Health Literacy: Adequate Health Literacy (07/22/2023)   B1300 Health Literacy  Frequency of need for help with medical instructions: Never    Family History  Problem Relation Age of Onset   Heart disease Mother    CVA Mother    Stroke Mother    CVA Father    Cancer Father    Brain cancer Sister    Prostate cancer Brother    Heart disease Brother    Lung cancer Brother    Heart attack Brother    Hypertension Neg Hx     Medications Ordered Prior to Encounter[1]  Allergies[2]     Physical Exam Vitals requested from patient and listed below if patient had equipment and was able to obtain at home for this virtual visit: There were no vitals filed for this visit. Estimated body mass index is 31.77 kg/m as calculated from the following:   Height as of 07/09/24: 5' 8.5 (1.74 m).   Weight as of 07/27/24: 212 lb (96.2 kg).  EKG (optional): deferred due to virtual visit  GENERAL: alert, oriented, no acute distress detected; full vision exam deferred due to pandemic and/or virtual encounter  PSYCH/NEURO: pleasant and cooperative, no obvious depression or anxiety, speech and thought processing grossly intact, Cognitive function grossly intact  Flowsheet Row Clinical Support from 09/27/2018 in [facility]  PHQ-9 Total Score 1        08/02/2024   10:51 AM 07/27/2024    3:31 PM 07/27/2024    2:11 PM 07/22/2023    1:08 PM  07/19/2022    1:41 PM  Depression screen PHQ 2/9  Decreased Interest 0 0 -- 0 0  Down, Depressed, Hopeless 0 0 -- 0 0  PHQ - 2 Score 0 0  0 0       07/22/2023    1:10 PM 01/10/2024    9:51 AM 07/27/2024    2:03 PM 07/27/2024    3:30 PM 08/02/2024   10:42 AM  Fall Risk  Falls in the past year? 0 0 -- Exclusion - non ambulatory 0  Number of falls in past year - Comments   Error    Was there an injury with Fall? 0  0  --  0  Was there an injury with Fall? - Comments   Error    Fall Risk Category Calculator 0 0   0  Patient at Risk for Falls Due to No Fall Risks No Fall Risks -- No Fall Risks No Fall Risks  Patient at Risk for Falls Due to - Comments   Error    Fall risk Follow up Falls prevention discussed Falls evaluation completed -- Falls evaluation completed Falls evaluation completed  Fall risk Follow up - Comments   Error       Data saved with a previous flowsheet row definition     SUMMARY AND PLAN:  Encounter for Medicare annual wellness exam  Discussed applicable health maintenance/preventive health measures and advised and referred or ordered per patient preferences: -discussed vaccines due recs/risks Health Maintenance  Topic Date Due   Zoster Vaccines- Shingrix (1 of 2) 07/14/1943   Influenza Vaccine  03/16/2024   COVID-19 Vaccine (5 - 2025-26 season) 04/16/2024   DTaP/Tdap/Td (3 - Tdap) 05/26/2025   Medicare Annual Wellness (AWV)  08/02/2025   Pneumococcal Vaccine: 50+ Years  Completed   Meningococcal B Vaccine  Aged [employer] and counseling on the following was provided based on the above review of health and a plan/checklist for the patient, along with additional information  discussed, was provided for the patient in the patient instructions :  -Advised on importance of completing advanced directives, discussed options for completing and provided information in patient instructions as well - they have and agree to bring copy -Provided counseling and  plan for difficulty hearing - he has hearing aids and has had evaluation, just has trouble on the phone so wife helps -Advised and counseled on a healthy lifestyle - including the importance of a healthy diet, regular physical activity, social connections and stress management. -Reviewed patient's current diet. Advised and counseled on a whole foods based healthy diet. A summary of a healthy diet was provided in the Patient Instructions.  -reviewed patient's current physical activity level and discussed exercise guidelines for adults. Congratulated on healthy habits and encouraged to continue.     Follow up: see patient instructions   Patient Instructions  I really enjoyed getting to talk with you today! I am available on Tuesdays and Thursdays for virtual visits if you have any questions or concerns, or if I can be of any further assistance.   CHECKLIST FROM ANNUAL WELLNESS VISIT:  -Follow up (please call to schedule if not scheduled after visit):   -yearly for annual wellness visit with primary care office  Here is a list of your preventive care/health maintenance measures and the plan for each if any are due:  PLAN For any measures below that may be due:    1. If you wish to get the shingles, flu or covid vaccines you may do so at the pharmacy. Please let us  know if you do so that we can update your record.   Health Maintenance  Topic Date Due   Zoster Vaccines- Shingrix (1 of 2) 07/14/1943   Influenza Vaccine  03/16/2024   COVID-19 Vaccine (5 - 2025-26 season) 04/16/2024   Medicare Annual Wellness (AWV)  07/21/2024   DTaP/Tdap/Td (3 - Tdap) 05/26/2025   Pneumococcal Vaccine: 50+ Years  Completed   Meningococcal B Vaccine  Aged Out    -See a dentist at least yearly  -Get your eyes checked and then per your eye specialist's recommendations  -Other issues addressed today:   1. Please bring a copy of the advanced directives to [facility].    -I have included below further  information regarding a healthy whole foods based diet, physical activity guidelines for adults, stress management and opportunities for social connections. I hope you find this information useful.   -----------------------------------------------------------------------------------------------------------------------------------------------------------------------------------------------------------------------------------------------------------    NUTRITION: -eat real food: lots of colorful vegetables (half the plate) and fruits -5-7 servings of vegetables and fruits per day (fresh or steamed is best), exp. 2 servings of vegetables with lunch and dinner and 2 servings of fruit per day. Berries and greens such as kale and collards are great choices.  -consume on a regular basis:  fresh fruits, fresh veggies, fish, nuts, seeds, healthy oils (such as olive oil, avocado oil), whole grains (make sure for bread/pasta/crackers/etc., that the first ingredient on label contains the word whole), legumes. -can eat small amounts of dairy and lean meat (no larger than the palm of your hand), but avoid processed meats such as ham, bacon, lunch meat, etc. -drink water  -try to avoid fast food and pre-packaged foods, processed meat, ultra processed foods/beverages (donuts, candy, etc.) -most experts advise limiting sodium to < 2300mg  per day, should limit further is any chronic conditions such as high blood pressure, heart disease, diabetes, etc. The American Heart Association advised that < 1500mg  is is ideal -try  to avoid foods/beverages that contain any ingredients with names you do not recognize  -try to avoid foods/beverages  with added sugar or sweeteners/sweets  -try to avoid sweet drinks (including diet drinks): soda, juice, Gatorade, sweet tea, power drinks, diet drinks -try to avoid white rice, white bread, pasta (unless whole grain)  EXERCISE GUIDELINES FOR ADULTS: -if you wish to increase your  physical activity, do so gradually and with the approval of your doctor -STOP and seek medical care immediately if you have any chest pain, chest discomfort or trouble breathing when starting or increasing exercise  -move and stretch your body, legs, feet and arms when sitting for long periods -Physical activity guidelines for optimal health in adults: -get at least 150 minutes per week of moderate exercise (can talk, but not sing); this is about 20-30 minutes of sustained activity 5-7 days per week or two 10-15 minute episodes of sustained activity 5-7 days per week -do some muscle building/resistance training/strength training at least 2 days per week  -balance exercises 3+ days per week:   Stand somewhere where you have something sturdy to hold onto if you lose balance    1) lift up on toes, then back down, start with 5x per day and work up to 20x   2) stand and lift one leg straight out to the side so that foot is a few inches of the floor, start with 5x each side and work up to 20x each side   3) stand on one foot, start with 5 seconds each side and work up to 20 seconds on each side  If you need ideas or help with getting more active:  -Silver sneakers https://tools.silversneakers.com  -Walk with a Doc: Http://www.duncan-williams.com/  -try to include resistance (weight lifting/strength building) and balance exercises twice per week: or the following link for ideas: http://castillo-powell.com/  buyducts.dk  STRESS MANAGEMENT: -can try meditating, or just sitting quietly with deep breathing while intentionally relaxing all parts of your body for 5 minutes daily -if you need further help with stress, anxiety or depression please follow up with your primary doctor or contact the wonderful folks at [facility]: [phone]  SOCIAL CONNECTIONS: -options in [place] if you wish to engage  in more social and exercise related activities:  -Silver sneakers https://tools.silversneakers.com  -Walk with a Doc: Http://www.duncan-williams.com/  -Check out the [facility] Active Adults 50+ section on the [place] of [employer] companies (hiking clubs, book clubs, cards and games, chess, exercise classes, aquatic classes and much more) - see the website for details: https://www.[place].gov/departments/parks-recreation/active-adults50  -YouTube has lots of exercise videos for different ages and abilities as well  -[facility] (a variety of indoor and outdoor inperson activities for adults). [phone]. [address].  -Virtual Online Classes (a variety of topics): see seniorplanet.org or call [phone]  -consider volunteering at a school, hospice center, church, senior center or elsewhere            Chiquita JONELLE Cramp, DO     [1]  Current Outpatient Medications on File Prior to Visit  Medication Sig Dispense Refill   amoxicillin (AMOXIL) 500 MG capsule Take 2,000 mg by mouth See admin instructions. Take 2000 mg 1 hour prior to dental work     benzonatate  (TESSALON ) 200 MG capsule Take 1 capsule (200 mg total) by mouth 3 (three) times daily as needed. 30 capsule 1   mirabegron ER (MYRBETRIQ) 50 MG TB24 tablet Take 50 mg by mouth every other day. At lunch  Omega-3 Fatty Acids (FISH OIL) 1000 MG CAPS Take 1,000 mg by mouth 2 (two) times daily.     polyethylene glycol powder (MIRALAX) 17 GM/SCOOP powder Take 1 Container by mouth as needed.     rosuvastatin  (CRESTOR ) 5 MG tablet TAKE 1 TABLET BY MOUTH DAILY 90 tablet 3   silodosin (RAPAFLO) 8 MG CAPS capsule Take 8 mg by mouth every evening. 1 tablet 30 minutes after evening meals     SODIUM FLUORIDE 5000 PLUS 1.1 % CREA dental cream Place 1 Application onto teeth at bedtime.     warfarin (COUMADIN ) 3 MG tablet TAKE 1/2 TO 1 TABLET BY MOUTH  DAILY AS DIRECTED BY COUMADIN   CLINIC 90 tablet 3   No current  facility-administered medications on file prior to visit.  [2] No Known Allergies

## 2024-08-27 ENCOUNTER — Ambulatory Visit

## 2024-08-28 LAB — CUP PACEART REMOTE DEVICE CHECK
Battery Remaining Longevity: 1 mo
Battery Remaining Percentage: 1 %
Battery Voltage: 2.65 V
Brady Statistic RV Percent Paced: 39 %
Date Time Interrogation Session: 20260111020012
Implantable Lead Connection Status: 753985
Implantable Lead Connection Status: 753985
Implantable Lead Implant Date: 20161108
Implantable Lead Implant Date: 20161108
Implantable Lead Location: 753859
Implantable Lead Location: 753860
Implantable Pulse Generator Implant Date: 20161108
Lead Channel Impedance Value: 440 Ohm
Lead Channel Pacing Threshold Amplitude: 0.625 V
Lead Channel Pacing Threshold Pulse Width: 0.5 ms
Lead Channel Sensing Intrinsic Amplitude: 12 mV
Lead Channel Setting Pacing Amplitude: 5 V
Lead Channel Setting Pacing Pulse Width: 0.5 ms
Lead Channel Setting Sensing Sensitivity: 5 mV
Pulse Gen Model: 2240
Pulse Gen Serial Number: 7825693

## 2024-09-05 ENCOUNTER — Ambulatory Visit: Attending: Cardiology | Admitting: *Deleted

## 2024-09-05 DIAGNOSIS — I639 Cerebral infarction, unspecified: Secondary | ICD-10-CM | POA: Diagnosis present

## 2024-09-05 DIAGNOSIS — Z5181 Encounter for therapeutic drug level monitoring: Secondary | ICD-10-CM | POA: Diagnosis present

## 2024-09-05 DIAGNOSIS — I4891 Unspecified atrial fibrillation: Secondary | ICD-10-CM | POA: Diagnosis present

## 2024-09-05 DIAGNOSIS — Z952 Presence of prosthetic heart valve: Secondary | ICD-10-CM | POA: Diagnosis present

## 2024-09-05 DIAGNOSIS — Z79899 Other long term (current) drug therapy: Secondary | ICD-10-CM | POA: Insufficient documentation

## 2024-09-05 LAB — POCT INR: INR: 3.6 — AB (ref 2.0–3.0)

## 2024-09-05 NOTE — Patient Instructions (Addendum)
 Description   INR-3.6; Do not take any warfarin tonight then continue taking warfarin 1/2 tablet daily except 1 tablet on Monday and Friday; Recheck INR in 4 weeks  with Provider Appointment (normally 8 weeks) per patient. Coumadin  Clinic [phone]. EAT [place]ENS TWICE PER WEEK.

## 2024-09-05 NOTE — Progress Notes (Signed)
 Description   INR-3.6; Do not take any warfarin tonight then continue taking warfarin 1/2 tablet daily except 1 tablet on Monday and Friday; Recheck INR in 4 weeks  with Provider Appointment (normally 8 weeks) per patient. Coumadin  Clinic [phone]. EAT GREENS TWICE PER WEEK.

## 2024-09-27 ENCOUNTER — Encounter

## 2024-10-02 ENCOUNTER — Ambulatory Visit: Admitting: Pulmonary Disease

## 2024-10-02 ENCOUNTER — Ambulatory Visit

## 2024-10-29 ENCOUNTER — Encounter

## 2024-11-20 ENCOUNTER — Ambulatory Visit: Admitting: Cardiology

## 2024-11-29 ENCOUNTER — Encounter

## 2025-01-10 ENCOUNTER — Encounter: Admitting: Adult Health
